# Patient Record
Sex: Male | Born: 1935 | Race: Black or African American | Hispanic: No | Marital: Married | Smoking: Never smoker
Health system: Southern US, Community
[De-identification: ages and names within clinical notes are randomized; demographics above are authoritative.]

## PROBLEM LIST (undated history)

## (undated) DIAGNOSIS — E119 Type 2 diabetes mellitus without complications: Secondary | ICD-10-CM

## (undated) DIAGNOSIS — I10 Essential (primary) hypertension: Secondary | ICD-10-CM

## (undated) DIAGNOSIS — I739 Peripheral vascular disease, unspecified: Secondary | ICD-10-CM

## (undated) HISTORY — PX: TOE SURGERY: SHX1073

## (undated) HISTORY — DX: Essential (primary) hypertension: I10

## (undated) HISTORY — DX: Type 2 diabetes mellitus without complications: E11.9

---

## 2006-03-31 ENCOUNTER — Ambulatory Visit: Payer: Self-pay | Admitting: *Deleted

## 2006-04-10 ENCOUNTER — Ambulatory Visit (HOSPITAL_COMMUNITY): Admission: RE | Admit: 2006-04-10 | Discharge: 2006-04-10 | Payer: Self-pay | Admitting: *Deleted

## 2006-04-13 ENCOUNTER — Ambulatory Visit: Payer: Self-pay | Admitting: Podiatry

## 2006-04-14 ENCOUNTER — Ambulatory Visit: Payer: Self-pay | Admitting: Podiatry

## 2012-11-12 ENCOUNTER — Ambulatory Visit: Payer: Self-pay | Admitting: Vascular Surgery

## 2012-11-12 LAB — BASIC METABOLIC PANEL
Anion Gap: 4 — ABNORMAL LOW (ref 7–16)
BUN: 10 mg/dL (ref 7–18)
Calcium, Total: 9.4 mg/dL (ref 8.5–10.1)
Chloride: 106 mmol/L (ref 98–107)
Co2: 31 mmol/L (ref 21–32)
Creatinine: 0.99 mg/dL (ref 0.60–1.30)
EGFR (African American): >60
EGFR (Non-African Amer.): >60
Glucose: 149 mg/dL — ABNORMAL HIGH (ref 65–99)
Osmolality: 283 (ref 275–301)
Potassium: 4 mmol/L (ref 3.5–5.1)
Sodium: 141 mmol/L (ref 136–145)

## 2012-11-21 ENCOUNTER — Ambulatory Visit: Payer: Self-pay | Admitting: Podiatry

## 2012-11-23 ENCOUNTER — Ambulatory Visit: Payer: Self-pay | Admitting: Podiatry

## 2012-11-26 LAB — PATHOLOGY REPORT

## 2012-11-27 LAB — WOUND CULTURE

## 2014-08-15 NOTE — Op Note (Signed)
PATIENT NAME:  Jose Schroeder, Murat MR#:  [id number] DATE OF BIRTH:  07/14/1932  DATE OF PROCEDURE:  11/12/2012  PREOPERATIVE DIAGNOSES: 1.  Peripheral arterial disease with ulceration, left great toe.  2.  Status previous toe amputation previously.  3.  Diabetes.  4.  Hypertension.   POSTOPERATIVE DIAGNOSES: 1.  Peripheral arterial disease with ulceration, left great toe.  2.  Status previous toe amputation previously.  3.  Diabetes.  4.  Hypertension.   PROCEDURES: 1.  Catheter placement to left peroneal artery from right femoral approach.  2.  Aortogram, selective left lower extremity angiogram.  3.  Percutaneous transluminal angioplasty of left peroneal artery with 3 mm diameter angioplasty balloon.  4.  StarClose closure device, right femoral artery.   SURGEON: Annice NeedyJason S. Isai Gottlieb, M.D.   ANESTHESIA: Local with moderate conscious sedation.   ESTIMATED BLOOD LOSS: Minimal.   INDICATION FOR PROCEDURE: This is a 81 year old African American male with multiple medical comorbidities, who has a nonhealing ulceration of left great toe. He has previously undergone toe amputation. He likely has some osteomyelitis in his left great toe and will need surgical treatment from his podiatrist. He is brought to the angiogram suite for further evaluation and potential treatment. Risks and benefits were discussed. Informed consent was obtained.   DESCRIPTION OF PROCEDURE: The patient is brought to the vascular interventional radiology suite. Groin was shaved and prepped, and a sterile surgical field was created. The right femoral head was localized with fluoroscopy, and the femoral artery was accessed without difficulty with a Seldinger needle. A J-wire and 5-French sheath was placed. Pigtail catheter was placed in the aorta at the L1-L2 level and AP aortogram was performed. This showed normal renal arteries, normal aorta and iliac segments without significant stenosis. I then hooked the aortic bifurcation and  advanced to the left femoral head and selective left lower extremity angiogram was then performed. This showed multiple areas of less than 30% stenosis in the superficial femoral and popliteal arteries that were not flow limiting. He then had initially a normal tibial trifurcation. The anterior tibial artery was occluded about 5 cm beyond its origin and did not really reconstitute. The posterior tibial artery was chronically occluded. The peroneal artery was the dominant runoff distally, and about 10 to 12 cm beyond its origin, it had a high-grade stenosis in the 80% to 90% range. Other than that, this was a good vessel and was his runoff to the foot, so we elected to treat this lesion. I placed a 6-French Ansel sheath over a Terumo Advantage wire and heparinized the patient. I was able to cross the lesion without difficulty with an 0.018 wire and a diagnostic catheter. I then performed balloon angioplasty of this lesion with a 3 mm diameter angioplasty balloon, and completion angiogram following angioplasty showed marked improvement with minimal residual stenosis in the peroneal artery. At this point, I elected to terminate the procedure. The sheaths were pulled back to the ipsilateral external iliac artery and oblique arteriogram was performed. StarClose closure device was deployed in the usual fashion with excellent hemostatic result. The patient tolerated the procedure well and was taken to the recovery room in stable condition.     ____________________________ Annice NeedyJason S. Bhavin Monjaraz, MD jsd:dmm D: 11/12/2012 11:01:09 ET T: 11/12/2012 11:17:44 ET JOB#: [id number]  cc: Annice NeedyJason S. Ann Bohne, MD, <Dictator> Duanne Limerickeanna C. Jones, MD Linus Galasodd Cline, DPM Jill AlexandersJustin A. Ether GriffinsFowler, DPM Rhona RaiderMatthew G. Troxler, DPM Annice NeedyJASON S Yuridia Couts MD ELECTRONICALLY SIGNED 11/12/2012 13:52

## 2014-08-15 NOTE — Op Note (Signed)
PATIENT NAME:  Jose Schroeder, Verdie MR#:  [id number] DATE OF BIRTH:  04-13-1933  DATE OF PROCEDURE:  11/23/2012  PREOPERATIVE DIAGNOSIS: Left great toe osteomyelitis.   POSTOPERATIVE DIAGNOSIS: Left great toe osteomyelitis.   PROCEDURE: Partial great toe amputation, interphalangeal joint, left foot.   SURGEON: Kacin Dancy A. Ether GriffinsFowler, DPM  ANESTHESIA: IV sedation with local.   COMPLICATIONS: None.   SPECIMEN: Distal phalanx, left great toe, for pathology and culture, left great toe.   ESTIMATED BLOOD LOSS: Minimal.   HEMOSTASIS: None.   OPERATIVE INDICATIONS: This is a 81 year old gentleman who has developed osteomyelitis on the distal aspect of his left great toe with positive erosive findings on the distal aspect of his great toe with purulent drainage. This seemed to be isolated to the distal phalanx and not proximal. We have discussed surgical intervention. He has undergone a lower extremity revascularization approximately a week ago. He presents today for surgery. All risks, benefits, alternatives and complications associated with the surgery had previously been discussed with the patient.   OPERATIVE PROCEDURE: The patient was brought into the OR and placed on the operating table in the supine position. IV sedation was administered by the anesthesia team. A local block was placed around the first MTPJ with lidocaine and Marcaine in a 1:1 fashion. A total of 10 mL was used. After sterile prep and drape, attention was directed to the distal left great toe, where a fishmouth-type of incision was made at the IPJ. The distal phalanx was disarticulated. The wound was flushed with copious amounts of irrigation. The head of the proximal phalanx was inspected, and no erosive changes or infectious tissue was grossly noted. This area was then irrigated with copious amounts of fluid. At this point, layered closure was performed with a 4-0 Vicryl for the subcutaneous tissue and a 3-0 nylon for the skin. A well  compressive sterile dressing was placed on his left foot. He tolerated the procedure and anesthesia well and was transported from the OR to the PACU with all vital signs stable and neurovascular status intact. I will see him in the outpatient clinic in 5 to 7 days.  ____________________________ Argentina DonovanJustin A. Ether GriffinsFowler, DPM jaf:OSi D: 11/23/2012 08:23:49 ET T: 11/23/2012 08:45:59 ET JOB#: [id number]  cc: Jill AlexandersJustin A. Ether GriffinsFowler, DPM, <Dictator> Demetrice Combes DPM ELECTRONICALLY SIGNED 12/18/2012 10:42

## 2014-09-09 DIAGNOSIS — E119 Type 2 diabetes mellitus without complications: Secondary | ICD-10-CM | POA: Diagnosis not present

## 2014-12-22 ENCOUNTER — Other Ambulatory Visit: Payer: Self-pay | Admitting: Family Medicine

## 2014-12-22 DIAGNOSIS — I10 Essential (primary) hypertension: Secondary | ICD-10-CM

## 2014-12-22 DIAGNOSIS — E119 Type 2 diabetes mellitus without complications: Secondary | ICD-10-CM

## 2015-01-23 ENCOUNTER — Other Ambulatory Visit: Payer: Self-pay | Admitting: Family Medicine

## 2015-01-23 DIAGNOSIS — E119 Type 2 diabetes mellitus without complications: Secondary | ICD-10-CM | POA: Diagnosis not present

## 2015-02-20 ENCOUNTER — Encounter: Payer: Self-pay | Admitting: Family Medicine

## 2015-02-20 ENCOUNTER — Ambulatory Visit (INDEPENDENT_AMBULATORY_CARE_PROVIDER_SITE_OTHER): Payer: Medicare Other | Admitting: Family Medicine

## 2015-02-20 VITALS — BP 138/80 | HR 80 | Ht 74.0 in | Wt 158.0 lb

## 2015-02-20 DIAGNOSIS — R5383 Other fatigue: Secondary | ICD-10-CM

## 2015-02-20 DIAGNOSIS — Z1211 Encounter for screening for malignant neoplasm of colon: Secondary | ICD-10-CM | POA: Diagnosis not present

## 2015-02-20 DIAGNOSIS — E119 Type 2 diabetes mellitus without complications: Secondary | ICD-10-CM

## 2015-02-20 DIAGNOSIS — Z23 Encounter for immunization: Secondary | ICD-10-CM | POA: Diagnosis not present

## 2015-02-20 DIAGNOSIS — E785 Hyperlipidemia, unspecified: Secondary | ICD-10-CM | POA: Diagnosis not present

## 2015-02-20 DIAGNOSIS — I10 Essential (primary) hypertension: Secondary | ICD-10-CM

## 2015-02-20 LAB — HEMOCCULT GUIAC POC 1CARD (OFFICE): Fecal Occult Blood, POC: NEGATIVE

## 2015-02-20 MED ORDER — METFORMIN HCL 500 MG PO TABS: 500.0000 mg | ORAL_TABLET | Freq: Two times a day (BID) | ORAL | Status: DC

## 2015-02-20 MED ORDER — PIOGLITAZONE HCL 15 MG PO TABS: ORAL_TABLET | ORAL | Status: DC

## 2015-02-20 MED ORDER — METOPROLOL TARTRATE 50 MG PO TABS: 50.0000 mg | ORAL_TABLET | Freq: Two times a day (BID) | ORAL | Status: DC

## 2015-02-20 MED ORDER — LISINOPRIL 2.5 MG PO TABS: ORAL_TABLET | ORAL | Status: DC

## 2015-02-20 NOTE — Progress Notes (Signed)
Name: Jose Schroeder   MRN: [id number]    DOB: 1932/11/21   Date:02/20/2015       Progress Note  Subjective  Chief Complaint  Chief Complaint  Patient presents with  . Hypertension  . Diabetes    Hypertension This is a chronic problem. The current episode started more than 1 year ago. The problem has been gradually improving since onset. The problem is controlled. Pertinent negatives include no anxiety, blurred vision, chest pain, headaches, malaise/fatigue, neck pain, orthopnea, palpitations, peripheral edema, PND, shortness of breath or sweats. There are no associated agents to hypertension. Risk factors for coronary artery disease include diabetes mellitus and male gender. Past treatments include ACE inhibitors and beta blockers. The current treatment provides mild improvement. There are no compliance problems.  There is no history of angina, kidney disease, CAD/MI, CVA, heart failure, left ventricular hypertrophy, PVD, renovascular disease or retinopathy. There is no history of chronic renal disease.  Diabetes He presents for his follow-up diabetic visit. He has type 2 diabetes mellitus. His disease course has been improving. There are no hypoglycemic associated symptoms. Pertinent negatives for hypoglycemia include no dizziness, headaches, nervousness/anxiousness or sweats. Pertinent negatives for diabetes include no blurred vision, no chest pain, no fatigue, no foot paresthesias, no foot ulcerations, no polydipsia, no polyphagia, no polyuria, no visual change, no weakness and no weight loss. There are no hypoglycemic complications. There are no diabetic complications. Pertinent negatives for diabetic complications include no CVA, heart disease, nephropathy, peripheral neuropathy, PVD or retinopathy. Current diabetic treatment includes oral agent (dual therapy). He is compliant with treatment all of the time. His weight is stable. He is following a generally healthy diet. He participates in  exercise daily. An ACE inhibitor/angiotensin II receptor blocker is being taken. He does not see a podiatrist.Eye exam is not current.    No problem-specific assessment & plan notes found for this encounter.   Past Medical History  Diagnosis Date  . Diabetes mellitus without complication (HCC)   . Hypertension     Past Surgical History  Procedure Laterality Date  . Toe surgery      amputated 2 toes on L) foot    History reviewed. No pertinent family history.  Social History   Social History  . Marital Status: Married    Spouse Name: N/A  . Number of Children: N/A  . Years of Education: N/A   Occupational History  . Not on file.   Social History Main Topics  . Smoking status: Never Smoker   . Smokeless tobacco: Not on file  . Alcohol Use: No  . Drug Use: No  . Sexual Activity: Not Currently   Other Topics Concern  . Not on file   Social History Narrative  . No narrative on file    No Known Allergies   Review of Systems  Constitutional: Negative for fever, chills, weight loss, malaise/fatigue and fatigue.  HENT: Negative for ear discharge, ear pain and sore throat.   Eyes: Negative for blurred vision.  Respiratory: Negative for cough, sputum production, shortness of breath and wheezing.   Cardiovascular: Negative for chest pain, palpitations, orthopnea, leg swelling and PND.  Gastrointestinal: Positive for constipation. Negative for heartburn, nausea, abdominal pain, diarrhea, blood in stool and melena.  Genitourinary: Negative for dysuria, urgency, frequency and hematuria.  Musculoskeletal: Negative for myalgias, back pain, joint pain and neck pain.  Skin: Negative for rash.  Neurological: Negative for dizziness, tingling, sensory change, focal weakness, weakness and headaches.  Endo/Heme/Allergies: Negative  for environmental allergies, polydipsia and polyphagia. Does not bruise/bleed easily.  Psychiatric/Behavioral: Negative for depression and suicidal  ideas. The patient is not nervous/anxious and does not have insomnia.      Objective  Filed Vitals:   02/20/15 1402  BP: 138/80  Pulse: 80  Height: 6\' 2"  (1.88 m)  Weight: 158 lb (71.668 kg)    Physical Exam  Constitutional: He is oriented to person, place, and time and well-developed, well-nourished, and in no distress.  HENT:  Head: Normocephalic.  Right Ear: External ear normal.  Left Ear: External ear normal.  Nose: Nose normal.  Mouth/Throat: Oropharynx is clear and moist.  Eyes: Conjunctivae and EOM are normal. Pupils are equal, round, and reactive to light. Right eye exhibits no discharge. Left eye exhibits no discharge. No scleral icterus.  conjunctiva pale  Neck: Normal range of motion. Neck supple. No JVD present. No tracheal deviation present. No thyromegaly present.  Cardiovascular: Normal rate, regular rhythm, normal heart sounds and intact distal pulses.  Exam reveals no gallop and no friction rub.   No murmur heard. Pulmonary/Chest: Breath sounds normal. No respiratory distress. He has no wheezes. He has no rales.  Abdominal: Soft. Bowel sounds are normal. He exhibits no mass. There is no hepatosplenomegaly. There is no tenderness. There is no rebound, no guarding and no CVA tenderness.  Genitourinary: Rectum normal and prostate normal. Guaiac negative stool.  Musculoskeletal: Normal range of motion. He exhibits no edema or tenderness.  Lymphadenopathy:    He has no cervical adenopathy.  Neurological: He is alert and oriented to person, place, and time. He has normal sensation, normal strength, normal reflexes and intact cranial nerves. No cranial nerve deficit.  Skin: Skin is warm. No rash noted. No cyanosis. There is pallor. Nails show no clubbing.  Nail beds pale  Psychiatric: Mood and affect normal.  Nursing note and vitals reviewed.     Assessment & Plan  Problem List Items Addressed This Visit    None    Visit Diagnoses    Essential hypertension     -  Primary    Relevant Medications    lisinopril (PRINIVIL,ZESTRIL) 2.5 MG tablet    metoprolol (LOPRESSOR) 50 MG tablet    Other Relevant Orders    Renal Function Panel    Type 2 diabetes mellitus without complication, without long-term current use of insulin (HCC)        Relevant Medications    lisinopril (PRINIVIL,ZESTRIL) 2.5 MG tablet    metFORMIN (GLUCOPHAGE) 500 MG tablet    pioglitazone (ACTOS) 15 MG tablet    Other Relevant Orders    Renal Function Panel    HgB A1c    Urine Microalbumin w/creat. ratio    Hyperlipidemia        Relevant Medications    lisinopril (PRINIVIL,ZESTRIL) 2.5 MG tablet    metoprolol (LOPRESSOR) 50 MG tablet    Other Relevant Orders    Lipid Profile    Other fatigue        Relevant Orders    POCT Occult Blood Stool    CBC w/Diff/Platelet    Colon cancer screening        Relevant Orders    POCT Occult Blood Stool    Need for Tdap vaccination        Relevant Orders    Tdap vaccine greater than or equal to 7yo IM (Completed)         Dr. Hayden Rasmusseneanna Valetta Mulroy [facility]  02/20/2015    

## 2015-02-21 LAB — CBC WITH DIFFERENTIAL/PLATELET
Basophils Absolute: 0 10*3/uL (ref 0.0–0.2)
Basos: 0 %
EOS (ABSOLUTE): 0.1 10*3/uL (ref 0.0–0.4)
Eos: 1 %
Hematocrit: 32.1 % — ABNORMAL LOW (ref 37.5–51.0)
Hemoglobin: 10.7 g/dL — ABNORMAL LOW (ref 12.6–17.7)
Immature Grans (Abs): 0 10*3/uL (ref 0.0–0.1)
Immature Granulocytes: 0 %
Lymphocytes Absolute: 1.4 10*3/uL (ref 0.7–3.1)
Lymphs: 25 %
MCH: 28.9 pg (ref 26.6–33.0)
MCHC: 33.3 g/dL (ref 31.5–35.7)
MCV: 87 fL (ref 79–97)
Monocytes Absolute: 0.4 10*3/uL (ref 0.1–0.9)
Monocytes: 7 %
Neutrophils Absolute: 3.7 10*3/uL (ref 1.4–7.0)
Neutrophils: 67 %
Platelets: 257 10*3/uL (ref 150–379)
RBC: 3.7 x10E6/uL — ABNORMAL LOW (ref 4.14–5.80)
RDW: 13.4 % (ref 12.3–15.4)
WBC: 5.7 10*3/uL (ref 3.4–10.8)

## 2015-02-21 LAB — RENAL FUNCTION PANEL
Albumin: 4.5 g/dL (ref 3.5–4.7)
BUN/Creatinine Ratio: 11 (ref 10–22)
BUN: 11 mg/dL (ref 8–27)
CO2: 25 mmol/L (ref 18–29)
Calcium: 9.4 mg/dL (ref 8.6–10.2)
Chloride: 101 mmol/L (ref 97–106)
Creatinine, Ser: 0.97 mg/dL (ref 0.76–1.27)
GFR calc Af Amer: 85 mL/min/{1.73_m2} (ref >59)
GFR calc non Af Amer: 73 mL/min/{1.73_m2} (ref >59)
Glucose: 150 mg/dL — ABNORMAL HIGH (ref 65–99)
Phosphorus: 3.5 mg/dL (ref 2.5–4.5)
Potassium: 4.6 mmol/L (ref 3.5–5.2)
Sodium: 141 mmol/L (ref 136–144)

## 2015-02-21 LAB — LIPID PANEL
Chol/HDL Ratio: 2.9 ratio units (ref 0.0–5.0)
Cholesterol, Total: 154 mg/dL (ref 100–199)
HDL: 53 mg/dL (ref >39)
LDL Calculated: 84 mg/dL (ref 0–99)
Triglycerides: 84 mg/dL (ref 0–149)
VLDL Cholesterol Cal: 17 mg/dL (ref 5–40)

## 2015-02-21 LAB — HEMOGLOBIN A1C
Est. average glucose Bld gHb Est-mCnc: 151 mg/dL
Hgb A1c MFr Bld: 6.9 % — ABNORMAL HIGH (ref 4.8–5.6)

## 2015-02-21 LAB — MICROALBUMIN / CREATININE URINE RATIO

## 2015-03-04 ENCOUNTER — Ambulatory Visit (INDEPENDENT_AMBULATORY_CARE_PROVIDER_SITE_OTHER): Payer: Medicare Other | Admitting: Family Medicine

## 2015-03-04 ENCOUNTER — Encounter: Payer: Self-pay | Admitting: Family Medicine

## 2015-03-04 VITALS — BP 138/80 | HR 68 | Ht 74.0 in | Wt 161.0 lb

## 2015-03-04 DIAGNOSIS — D649 Anemia, unspecified: Secondary | ICD-10-CM | POA: Diagnosis not present

## 2015-03-04 LAB — HEMOCCULT GUIAC POC 1CARD (OFFICE): Card #3 Fecal Occult Blood, POC: NEGATIVE

## 2015-03-04 MED ORDER — FERROUS SULFATE 325 (65 FE) MG PO TABS: 325.0000 mg | ORAL_TABLET | Freq: Every day | ORAL | Status: DC

## 2015-03-04 NOTE — Progress Notes (Signed)
Name: Jose Schroeder   MRN: [id number]    DOB: Dec 07, 1932   Date:03/04/2015       Progress Note  Subjective  Chief Complaint  Chief Complaint  Patient presents with  . Follow-up    anemia    Anemia Presents for follow-up visit. Symptoms include pallor. There has been no abdominal pain, anorexia, bruising/bleeding easily, confusion, fever, leg swelling, light-headedness, malaise/fatigue, palpitations, paresthesias, pica or weight loss. Signs of blood loss that are not present include hematemesis, hematochezia and melena. Past treatments include changes in diet. There is no history of alcohol abuse, cancer, chronic liver disease, chronic renal disease, clotting disorder, dementia, heart failure, hemoglobinopathy, hypothyroidism, malabsorption, malnutrition, neuropathy or rheumatic disease. There are no compliance problems.     No problem-specific assessment & plan notes found for this encounter.   Past Medical History  Diagnosis Date  . Diabetes mellitus without complication (HCC)   . Hypertension     Past Surgical History  Procedure Laterality Date  . Toe surgery      amputated 2 toes on L) foot    History reviewed. No pertinent family history.  Social History   Social History  . Marital Status: Married    Spouse Name: N/A  . Number of Children: N/A  . Years of Education: N/A   Occupational History  . Not on file.   Social History Main Topics  . Smoking status: Never Smoker   . Smokeless tobacco: Not on file  . Alcohol Use: No  . Drug Use: No  . Sexual Activity: Not Currently   Other Topics Concern  . Not on file   Social History Narrative    No Known Allergies   Review of Systems  Constitutional: Negative for fever, weight loss and malaise/fatigue.  Cardiovascular: Negative for palpitations.  Gastrointestinal: Negative for abdominal pain, melena, hematochezia, anorexia and hematemesis.  Skin: Positive for pallor.  Neurological: Negative for  light-headedness and paresthesias.  Endo/Heme/Allergies: Does not bruise/bleed easily.  Psychiatric/Behavioral: Negative for confusion.     Objective  Filed Vitals:   03/04/15 1021  BP: 138/80  Pulse: 68  Height: 6\' 2"  (1.88 m)  Weight: 161 lb (73.029 kg)    Physical Exam  Constitutional: He is oriented to person, place, and time and well-developed, well-nourished, and in no distress.  HENT:  Head: Normocephalic.  Right Ear: External ear normal.  Left Ear: External ear normal.  Nose: Nose normal.  Mouth/Throat: Oropharynx is clear and moist.  Eyes: Conjunctivae and EOM are normal. Pupils are equal, round, and reactive to light. Right eye exhibits no discharge. Left eye exhibits no discharge. No scleral icterus.  Neck: Normal range of motion. Neck supple. No JVD present. No tracheal deviation present. No thyromegaly present.  Cardiovascular: Normal rate, regular rhythm, normal heart sounds and intact distal pulses.  Exam reveals no gallop and no friction rub.   No murmur heard. Pulmonary/Chest: Breath sounds normal. No respiratory distress. He has no wheezes. He has no rales.  Abdominal: Soft. Bowel sounds are normal. He exhibits no mass. There is no hepatosplenomegaly. There is no tenderness. There is no rebound, no guarding and no CVA tenderness.  Genitourinary: Rectum normal and prostate normal.  Musculoskeletal: Normal range of motion. He exhibits no edema or tenderness.  Lymphadenopathy:    He has no cervical adenopathy.  Neurological: He is alert and oriented to person, place, and time. He has normal sensation, normal strength, normal reflexes and intact cranial nerves. No cranial nerve deficit.  Skin:  Skin is warm. No rash noted.  Psychiatric: Mood and affect normal.      Assessment & Plan  Problem List Items Addressed This Visit    None    Visit Diagnoses    Anemia, unspecified anemia type    -  Primary    Relevant Medications    ferrous sulfate 325 (65 FE) MG  tablet    Other Relevant Orders    POCT Occult Blood Stool (Completed)         Dr. Elizabeth Sauer [facility]  03/04/2015

## 2015-06-02 DIAGNOSIS — E119 Type 2 diabetes mellitus without complications: Secondary | ICD-10-CM | POA: Diagnosis not present

## 2015-09-24 ENCOUNTER — Encounter: Payer: Self-pay | Admitting: Family Medicine

## 2015-09-24 ENCOUNTER — Ambulatory Visit (INDEPENDENT_AMBULATORY_CARE_PROVIDER_SITE_OTHER): Payer: Medicare Other | Admitting: Family Medicine

## 2015-09-24 VITALS — BP 138/80 | HR 72 | Ht 74.0 in | Wt 163.0 lb

## 2015-09-24 DIAGNOSIS — E1159 Type 2 diabetes mellitus with other circulatory complications: Secondary | ICD-10-CM | POA: Insufficient documentation

## 2015-09-24 DIAGNOSIS — I10 Essential (primary) hypertension: Secondary | ICD-10-CM

## 2015-09-24 DIAGNOSIS — E119 Type 2 diabetes mellitus without complications: Secondary | ICD-10-CM | POA: Diagnosis not present

## 2015-09-24 DIAGNOSIS — D509 Iron deficiency anemia, unspecified: Secondary | ICD-10-CM

## 2015-09-24 DIAGNOSIS — Z23 Encounter for immunization: Secondary | ICD-10-CM | POA: Diagnosis not present

## 2015-09-24 MED ORDER — PIOGLITAZONE HCL 15 MG PO TABS: ORAL_TABLET | ORAL | Status: DC

## 2015-09-24 MED ORDER — GLIPIZIDE ER 10 MG PO TB24
10.0000 mg | ORAL_TABLET | Freq: Every day | ORAL | Status: DC
Start: 2015-09-24 — End: 2015-12-10

## 2015-09-24 MED ORDER — METFORMIN HCL 500 MG PO TABS: 500.0000 mg | ORAL_TABLET | Freq: Two times a day (BID) | ORAL | Status: DC

## 2015-09-24 MED ORDER — FERROUS SULFATE 325 (65 FE) MG PO TABS: 325.0000 mg | ORAL_TABLET | Freq: Every day | ORAL | Status: DC

## 2015-09-24 MED ORDER — LISINOPRIL 2.5 MG PO TABS: ORAL_TABLET | ORAL | Status: DC

## 2015-09-24 MED ORDER — METOPROLOL TARTRATE 50 MG PO TABS: 50.0000 mg | ORAL_TABLET | Freq: Two times a day (BID) | ORAL | Status: DC

## 2015-09-24 NOTE — Patient Instructions (Signed)
Diabetic Retinopathy Diabetic retinopathy is a disease of the light-sensitive membrane at the back of the eye (retina). It is a complication of diabetes and a common cause of blindness. Early detection of the disease is key to keeping your eyes healthy.  CAUSES  Diabetic retinopathy is caused by blood sugar (glucose) levels that are too high over an extended period of time. High blood sugars cause damage to the small blood vessels of the retina, allowing blood to leak through the vessel walls. This causes visual impairment and eventually blindness. RISK FACTORS  High blood pressure.  Having diabetes for a long time.  Having poorly controlled blood sugars. SIGNS AND SYMPTOMS  In the early stages of diabetic retinopathy, there are often no symptoms. As the condition advances, symptoms may include:  Blurred vision. This is usually caused by a swelling due to abnormal blood glucose levels. The blurriness may go away when blood glucose levels return to normal.  Moving specks or dark spots (floaters) in your vision. These can be caused by a small retinal hemorrhage. A hemorrhage is bleeding from blood vessels.  Missing parts of your field of vision, such as things at the side. This can be caused by larger retinal hemorrhages.  Difficulty reading books or signs.  Double vision.  Pain in one or both eyes.  Feeling pressure in one or both eyes.  Trouble seeing straight lines. Straight lines do not look straight.  Redness of the eyes that does not go away. DIAGNOSIS  Your eye care specialist can detect changes in the blood vessels of your eye by putting drops in your eyes that enlarge (dilate) your pupils. This allows your eye care specialist to get a good look at your retina to see if there are any changes that have occurred as a result of your diabetes. You should have your eyes examined once a year. TREATMENT  Your eye care specialist may use a special laser beam to seal the blood vessels  of the retina and stop them from leaking. Early detection and treatment are important so that further damage to your eyes can be prevented. In addition, managing your blood sugars and keeping them in the target range can slow the progress of the disease. HOME CARE INSTRUCTIONS   Keep your blood pressure within your target range.  Keep your blood glucose levels within your target range.  Follow your health care provider's instructions regarding diet and other means for controlling your blood glucose levels.  Check your blood levels for glucose as recommended by your health care provider.  Keep regular appointments with your eye specialist. An eye specialist can usually see diabetic retinopathy developing long before it starts causing problems. In many cases, it can be treated to prevent complications from occurring. If you have diabetes, you should have your eyes checked at least every year. Your risk of retinopathy increases the longer you have the disease.  If you smoke, quit. Ask your health care provider for help if needed. Smoking can make retinopathy worse. SEEK MEDICAL CARE IF:   You notice gradual blurring or other changes in your vision over time.  You notice that your glasses or contact lenses do not make things look as sharp as they once did.  You have trouble reading or seeing details at a distance with either eye.  You notice a sudden change in your vision or notice that parts of your field of vision appear missing or hazy.  You suddenly see moving specks or dark spots   in the field of vision of either eye.  You have sudden partial loss of vision in either eye.   This information is not intended to replace advice given to you by your health care provider. Make sure you discuss any questions you have with your health care provider.   Document Released: 04/08/2000 Document Revised: 01/30/2013 Document Reviewed: 10/01/2012 Elsevier Interactive Patient Education 2016 [employer]  

## 2015-09-24 NOTE — Progress Notes (Signed)
Name: Jose Schroeder   MRN: [id number]    DOB: [birth year]   Date:09/24/2015       Progress Note  Subjective  Chief Complaint  Chief Complaint  Patient presents with  . Hypertension  . Diabetes    Hypertension This is a new problem. The current episode started more than 1 year ago. The problem has been gradually worsening since onset. The problem is controlled. Pertinent negatives include no anxiety, blurred vision, chest pain, headaches, malaise/fatigue, neck pain, orthopnea, palpitations, peripheral edema, PND, shortness of breath or sweats. There are no associated agents to hypertension. There are no known risk factors for coronary artery disease. Past treatments include ACE inhibitors and diuretics. The current treatment provides moderate improvement. There are no compliance problems.  There is no history of angina, kidney disease, CAD/MI, CVA, heart failure, left ventricular hypertrophy, PVD, renovascular disease or retinopathy. There is no history of chronic renal disease or a hypertension causing med.  Diabetes He presents for his follow-up diabetic visit. He has type 2 diabetes mellitus. There are no hypoglycemic associated symptoms. Pertinent negatives for hypoglycemia include no dizziness, headaches, nervousness/anxiousness or sweats. Pertinent negatives for diabetes include no blurred vision, no chest pain, no fatigue, no foot paresthesias, no foot ulcerations, no polydipsia, no polyphagia, no polyuria, no visual change, no weakness and no weight loss. There are no hypoglycemic complications. There are no diabetic complications. Pertinent negatives for diabetic complications include no CVA, PVD or retinopathy. Current diabetic treatment includes oral agent (triple therapy). He is compliant with treatment most of the time. His weight is stable. He is following a generally healthy diet. He rarely participates in exercise. His breakfast blood glucose is taken between 8-9 am. His breakfast  blood glucose range is generally 90-110 mg/dl. He does not see a podiatrist.Eye exam is not current.    No problem-specific assessment & plan notes found for this encounter.   Past Medical History  Diagnosis Date  . Diabetes mellitus without complication (HCC)   . Hypertension     Past Surgical History  Procedure Laterality Date  . Toe surgery      amputated 2 toes on L) foot    History reviewed. No pertinent family history.  Social History   Social History  . Marital Status: Married    Spouse Name: N/A  . Number of Children: N/A  . Years of Education: N/A   Occupational History  . Not on file.   Social History Main Topics  . Smoking status: Never Smoker   . Smokeless tobacco: Not on file  . Alcohol Use: No  . Drug Use: No  . Sexual Activity: Not Currently   Other Topics Concern  . Not on file   Social History Narrative    No Known Allergies   Review of Systems  Constitutional: Negative for fever, chills, weight loss, malaise/fatigue and fatigue.  HENT: Negative for ear discharge, ear pain and sore throat.   Eyes: Negative for blurred vision.  Respiratory: Negative for cough, sputum production, shortness of breath and wheezing.   Cardiovascular: Negative for chest pain, palpitations, orthopnea, leg swelling and PND.  Gastrointestinal: Negative for heartburn, nausea, abdominal pain, diarrhea, constipation, blood in stool and melena.  Genitourinary: Negative for dysuria, urgency, frequency and hematuria.  Musculoskeletal: Negative for myalgias, back pain, joint pain and neck pain.  Skin: Negative for rash.  Neurological: Negative for dizziness, tingling, sensory change, focal weakness, weakness and headaches.  Endo/Heme/Allergies: Negative for environmental allergies, polydipsia and polyphagia. Does not  bruise/bleed easily.  Psychiatric/Behavioral: Negative for depression and suicidal ideas. The patient is not nervous/anxious and does not have insomnia.       Objective  Filed Vitals:   09/24/15 1346  BP: 138/80  Pulse: 72  Height: 6\' 2"  (1.88 m)  Weight: 163 lb (73.936 kg)    Physical Exam  Constitutional: He is oriented to person, place, and time and well-developed, well-nourished, and in no distress.  HENT:  Head: Normocephalic.  Right Ear: External ear normal.  Left Ear: External ear normal.  Nose: Nose normal.  Mouth/Throat: Oropharynx is clear and moist.  Eyes: Conjunctivae and EOM are normal. Pupils are equal, round, and reactive to light. Right eye exhibits no discharge. Left eye exhibits no discharge. No scleral icterus.  Neck: Normal range of motion. Neck supple. No JVD present. No tracheal deviation present. No thyromegaly present.  Cardiovascular: Normal rate, regular rhythm, normal heart sounds and intact distal pulses.  Exam reveals no gallop and no friction rub.   No murmur heard. Pulmonary/Chest: Breath sounds normal. No respiratory distress. He has no wheezes. He has no rales.  Abdominal: Soft. Bowel sounds are normal. He exhibits no mass. There is no hepatosplenomegaly. There is no tenderness. There is no rebound, no guarding and no CVA tenderness.  Musculoskeletal: Normal range of motion. He exhibits no edema or tenderness.  Lymphadenopathy:    He has no cervical adenopathy.  Neurological: He is alert and oriented to person, place, and time. He has normal sensation, normal strength, normal reflexes and intact cranial nerves. No cranial nerve deficit.  Skin: Skin is warm. No rash noted.  Psychiatric: Mood and affect normal.  Nursing note and vitals reviewed.     Assessment & Plan  Problem List Items Addressed This Visit      Cardiovascular and Mediastinum   Essential hypertension - Primary   Relevant Medications   lisinopril (PRINIVIL,ZESTRIL) 2.5 MG tablet   metoprolol (LOPRESSOR) 50 MG tablet   Other Relevant Orders   Renal Function Panel     Endocrine   Type 2 diabetes mellitus without  complication, without long-term current use of insulin (HCC)   Relevant Medications   lisinopril (PRINIVIL,ZESTRIL) 2.5 MG tablet   pioglitazone (ACTOS) 15 MG tablet   glipiZIDE (GLUCOTROL XL) 10 MG 24 hr tablet   metFORMIN (GLUCOPHAGE) 500 MG tablet   Other Relevant Orders   Hemoglobin A1c   Microalbumin / creatinine urine ratio    Other Visit Diagnoses    Anemia, iron deficiency        Relevant Medications    ferrous sulfate 325 (65 FE) MG tablet    Other Relevant Orders    Hemoglobin    Need for pneumococcal vaccination        Relevant Orders    Pneumococcal conjugate vaccine 13-valent (Completed)         Dr. Hayden Rasmusseneanna Jones [facility]  09/24/2015

## 2015-09-25 LAB — RENAL FUNCTION PANEL
Albumin: 4.5 g/dL (ref 3.5–4.7)
BUN/Creatinine Ratio: 13 (ref 10–24)
BUN: 13 mg/dL (ref 8–27)
CO2: 22 mmol/L (ref 18–29)
Calcium: 9.4 mg/dL (ref 8.6–10.2)
Chloride: 100 mmol/L (ref 96–106)
Creatinine, Ser: 0.99 mg/dL (ref 0.76–1.27)
GFR calc Af Amer: 82 mL/min/{1.73_m2} (ref >59)
GFR calc non Af Amer: 71 mL/min/{1.73_m2} (ref >59)
Glucose: 168 mg/dL — ABNORMAL HIGH (ref 65–99)
Phosphorus: 3 mg/dL (ref 2.5–4.5)
Potassium: 4.2 mmol/L (ref 3.5–5.2)
Sodium: 141 mmol/L (ref 134–144)

## 2015-09-25 LAB — MICROALBUMIN / CREATININE URINE RATIO

## 2015-09-25 LAB — HEMOGLOBIN: Hemoglobin: 11 g/dL — ABNORMAL LOW (ref 12.6–17.7)

## 2015-09-25 LAB — HEMOGLOBIN A1C
Est. average glucose Bld gHb Est-mCnc: 157 mg/dL
Hgb A1c MFr Bld: 7.1 % — ABNORMAL HIGH (ref 4.8–5.6)

## 2015-10-07 DIAGNOSIS — E119 Type 2 diabetes mellitus without complications: Secondary | ICD-10-CM | POA: Diagnosis not present

## 2015-10-29 ENCOUNTER — Other Ambulatory Visit: Payer: Self-pay

## 2015-10-30 ENCOUNTER — Other Ambulatory Visit: Payer: Self-pay

## 2015-12-10 ENCOUNTER — Other Ambulatory Visit: Payer: Self-pay

## 2015-12-10 DIAGNOSIS — E119 Type 2 diabetes mellitus without complications: Secondary | ICD-10-CM

## 2015-12-10 DIAGNOSIS — I10 Essential (primary) hypertension: Secondary | ICD-10-CM

## 2015-12-10 MED ORDER — METFORMIN HCL 500 MG PO TABS: 500.0000 mg | ORAL_TABLET | Freq: Two times a day (BID) | ORAL | 0 refills | Status: DC

## 2015-12-10 MED ORDER — METOPROLOL TARTRATE 50 MG PO TABS: 50.0000 mg | ORAL_TABLET | Freq: Two times a day (BID) | ORAL | 0 refills | Status: DC

## 2015-12-10 MED ORDER — LISINOPRIL 2.5 MG PO TABS: ORAL_TABLET | ORAL | 0 refills | Status: DC

## 2015-12-10 MED ORDER — GLIPIZIDE ER 10 MG PO TB24: 10.0000 mg | ORAL_TABLET | Freq: Every day | ORAL | 0 refills | Status: DC

## 2015-12-10 MED ORDER — PIOGLITAZONE HCL 15 MG PO TABS: ORAL_TABLET | ORAL | 0 refills | Status: DC

## 2015-12-22 ENCOUNTER — Other Ambulatory Visit: Payer: Self-pay

## 2015-12-22 DIAGNOSIS — I10 Essential (primary) hypertension: Secondary | ICD-10-CM

## 2015-12-22 MED ORDER — METOPROLOL TARTRATE 50 MG PO TABS: 50.0000 mg | ORAL_TABLET | Freq: Two times a day (BID) | ORAL | 0 refills | Status: DC

## 2015-12-25 DIAGNOSIS — E11621 Type 2 diabetes mellitus with foot ulcer: Secondary | ICD-10-CM | POA: Diagnosis not present

## 2015-12-25 DIAGNOSIS — I739 Peripheral vascular disease, unspecified: Secondary | ICD-10-CM | POA: Diagnosis not present

## 2015-12-25 DIAGNOSIS — L97509 Non-pressure chronic ulcer of other part of unspecified foot with unspecified severity: Secondary | ICD-10-CM | POA: Diagnosis not present

## 2015-12-25 DIAGNOSIS — L97512 Non-pressure chronic ulcer of other part of right foot with fat layer exposed: Secondary | ICD-10-CM | POA: Diagnosis not present

## 2016-01-04 ENCOUNTER — Encounter: Payer: Self-pay | Admitting: Internal Medicine

## 2016-01-04 ENCOUNTER — Inpatient Hospital Stay
Admission: AD | Admit: 2016-01-04 | Discharge: 2016-01-08 | DRG: 253 | Disposition: A | Discharge status: Home Health | Payer: Medicare Other | Source: Ambulatory Visit | Attending: Internal Medicine | Admitting: Internal Medicine

## 2016-01-04 DIAGNOSIS — E1152 Type 2 diabetes mellitus with diabetic peripheral angiopathy with gangrene: Principal | ICD-10-CM | POA: Diagnosis present

## 2016-01-04 DIAGNOSIS — Z89422 Acquired absence of other left toe(s): Secondary | ICD-10-CM

## 2016-01-04 DIAGNOSIS — I745 Embolism and thrombosis of iliac artery: Secondary | ICD-10-CM | POA: Diagnosis not present

## 2016-01-04 DIAGNOSIS — Z79899 Other long term (current) drug therapy: Secondary | ICD-10-CM | POA: Diagnosis not present

## 2016-01-04 DIAGNOSIS — I714 Abdominal aortic aneurysm, without rupture: Secondary | ICD-10-CM | POA: Diagnosis present

## 2016-01-04 DIAGNOSIS — I70262 Atherosclerosis of native arteries of extremities with gangrene, left leg: Secondary | ICD-10-CM | POA: Diagnosis present

## 2016-01-04 DIAGNOSIS — L03115 Cellulitis of right lower limb: Secondary | ICD-10-CM | POA: Diagnosis not present

## 2016-01-04 DIAGNOSIS — I70261 Atherosclerosis of native arteries of extremities with gangrene, right leg: Secondary | ICD-10-CM | POA: Diagnosis not present

## 2016-01-04 DIAGNOSIS — Z7982 Long term (current) use of aspirin: Secondary | ICD-10-CM | POA: Diagnosis not present

## 2016-01-04 DIAGNOSIS — L97513 Non-pressure chronic ulcer of other part of right foot with necrosis of muscle: Secondary | ICD-10-CM | POA: Diagnosis not present

## 2016-01-04 DIAGNOSIS — E119 Type 2 diabetes mellitus without complications: Secondary | ICD-10-CM | POA: Diagnosis not present

## 2016-01-04 DIAGNOSIS — I739 Peripheral vascular disease, unspecified: Secondary | ICD-10-CM | POA: Diagnosis not present

## 2016-01-04 DIAGNOSIS — Z7902 Long term (current) use of antithrombotics/antiplatelets: Secondary | ICD-10-CM | POA: Diagnosis not present

## 2016-01-04 DIAGNOSIS — I10 Essential (primary) hypertension: Secondary | ICD-10-CM | POA: Diagnosis not present

## 2016-01-04 DIAGNOSIS — E11621 Type 2 diabetes mellitus with foot ulcer: Secondary | ICD-10-CM | POA: Diagnosis not present

## 2016-01-04 DIAGNOSIS — I96 Gangrene, not elsewhere classified: Secondary | ICD-10-CM | POA: Diagnosis not present

## 2016-01-04 DIAGNOSIS — Z7984 Long term (current) use of oral hypoglycemic drugs: Secondary | ICD-10-CM | POA: Diagnosis not present

## 2016-01-04 DIAGNOSIS — L97509 Non-pressure chronic ulcer of other part of unspecified foot with unspecified severity: Secondary | ICD-10-CM | POA: Diagnosis not present

## 2016-01-04 DIAGNOSIS — I70269 Atherosclerosis of native arteries of extremities with gangrene, unspecified extremity: Secondary | ICD-10-CM

## 2016-01-04 DIAGNOSIS — Z23 Encounter for immunization: Secondary | ICD-10-CM

## 2016-01-04 DIAGNOSIS — I743 Embolism and thrombosis of arteries of the lower extremities: Secondary | ICD-10-CM | POA: Diagnosis not present

## 2016-01-04 DIAGNOSIS — I119 Hypertensive heart disease without heart failure: Secondary | ICD-10-CM | POA: Diagnosis present

## 2016-01-04 DIAGNOSIS — L089 Local infection of the skin and subcutaneous tissue, unspecified: Secondary | ICD-10-CM | POA: Diagnosis not present

## 2016-01-04 LAB — PROTIME-INR
INR: 1.05
Prothrombin Time: 13.7 seconds (ref 11.4–15.2)

## 2016-01-04 LAB — CBC
HCT: 34.9 % — ABNORMAL LOW (ref 40.0–52.0)
Hemoglobin: 12 g/dL — ABNORMAL LOW (ref 13.0–18.0)
MCH: 30.5 pg (ref 26.0–34.0)
MCHC: 34.3 g/dL (ref 32.0–36.0)
MCV: 88.8 fL (ref 80.0–100.0)
Platelets: 327 10*3/uL (ref 150–440)
RBC: 3.93 MIL/uL — ABNORMAL LOW (ref 4.40–5.90)
RDW: 12.9 % (ref 11.5–14.5)
WBC: 8.5 10*3/uL (ref 3.8–10.6)

## 2016-01-04 LAB — BASIC METABOLIC PANEL
Anion gap: 5 (ref 5–15)
BUN: 10 mg/dL (ref 6–20)
CO2: 30 mmol/L (ref 22–32)
Calcium: 9.5 mg/dL (ref 8.9–10.3)
Chloride: 102 mmol/L (ref 101–111)
Creatinine, Ser: 0.8 mg/dL (ref 0.61–1.24)
GFR calc Af Amer: >60 mL/min (ref >60)
GFR calc non Af Amer: >60 mL/min (ref >60)
Glucose, Bld: 170 mg/dL — ABNORMAL HIGH (ref 65–99)
Potassium: 4.3 mmol/L (ref 3.5–5.1)
Sodium: 137 mmol/L (ref 135–145)

## 2016-01-04 LAB — APTT: aPTT: 33 seconds (ref 24–36)

## 2016-01-04 LAB — GLUCOSE, CAPILLARY
Glucose-Capillary: 167 mg/dL — ABNORMAL HIGH (ref 65–99)
Glucose-Capillary: 222 mg/dL — ABNORMAL HIGH (ref 65–99)

## 2016-01-04 MED ORDER — PIPERACILLIN-TAZOBACTAM 3.375 G IVPB
3.3750 g | Freq: Three times a day (TID) | INTRAVENOUS | Status: DC
  Administered 2016-01-04 – 2016-01-08 (×11): 3.375 g via INTRAVENOUS
  Filled 2016-01-04 (×11): qty 50

## 2016-01-04 MED ORDER — ONDANSETRON HCL 4 MG/2ML IJ SOLN: 4.0000 mg | Freq: Four times a day (QID) | INTRAMUSCULAR | Status: DC | PRN

## 2016-01-04 MED ORDER — METOPROLOL TARTRATE 50 MG PO TABS
50.0000 mg | ORAL_TABLET | Freq: Two times a day (BID) | ORAL | Status: DC
  Administered 2016-01-04 – 2016-01-08 (×8): 50 mg via ORAL
  Filled 2016-01-04 (×8): qty 1

## 2016-01-04 MED ORDER — HEPARIN SODIUM (PORCINE) 5000 UNIT/ML IJ SOLN
5000.0000 [IU] | Freq: Three times a day (TID) | INTRAMUSCULAR | Status: DC
  Administered 2016-01-04 – 2016-01-05 (×3): 5000 [IU] via SUBCUTANEOUS
  Filled 2016-01-04 (×4): qty 1

## 2016-01-04 MED ORDER — LISINOPRIL 5 MG PO TABS
2.5000 mg | ORAL_TABLET | Freq: Every day | ORAL | Status: DC
  Administered 2016-01-04 – 2016-01-08 (×5): 2.5 mg via ORAL
  Filled 2016-01-04 (×5): qty 1

## 2016-01-04 MED ORDER — VANCOMYCIN HCL IN DEXTROSE 750-5 MG/150ML-% IV SOLN
750.0000 mg | Freq: Two times a day (BID) | INTRAVENOUS | Status: DC
  Administered 2016-01-05 – 2016-01-08 (×7): 750 mg via INTRAVENOUS
  Filled 2016-01-04 (×10): qty 150

## 2016-01-04 MED ORDER — SENNOSIDES-DOCUSATE SODIUM 8.6-50 MG PO TABS: 1.0000 | ORAL_TABLET | Freq: Every evening | ORAL | Status: DC | PRN

## 2016-01-04 MED ORDER — SODIUM CHLORIDE 0.9% FLUSH: 3.0000 mL | INTRAVENOUS | Status: DC | PRN

## 2016-01-04 MED ORDER — SODIUM CHLORIDE 0.9 % IV SOLN: 250.0000 mL | INTRAVENOUS | Status: DC | PRN

## 2016-01-04 MED ORDER — FERROUS SULFATE 325 (65 FE) MG PO TABS
325.0000 mg | ORAL_TABLET | Freq: Every day | ORAL | Status: DC
  Administered 2016-01-05 – 2016-01-08 (×3): 325 mg via ORAL
  Filled 2016-01-04 (×3): qty 1

## 2016-01-04 MED ORDER — ACETAMINOPHEN 325 MG PO TABS: 650.0000 mg | ORAL_TABLET | Freq: Four times a day (QID) | ORAL | Status: DC | PRN

## 2016-01-04 MED ORDER — VANCOMYCIN HCL IN DEXTROSE 1-5 GM/200ML-% IV SOLN
1000.0000 mg | Freq: Once | INTRAVENOUS | Status: AC
  Administered 2016-01-04: 1000 mg via INTRAVENOUS
  Filled 2016-01-04: qty 200

## 2016-01-04 MED ORDER — BISACODYL 5 MG PO TBEC: 5.0000 mg | DELAYED_RELEASE_TABLET | Freq: Every day | ORAL | Status: DC | PRN

## 2016-01-04 MED ORDER — ALBUTEROL SULFATE (2.5 MG/3ML) 0.083% IN NEBU: 2.5000 mg | INHALATION_SOLUTION | RESPIRATORY_TRACT | Status: DC | PRN

## 2016-01-04 MED ORDER — INSULIN ASPART 100 UNIT/ML ~~LOC~~ SOLN
0.0000 [IU] | Freq: Three times a day (TID) | SUBCUTANEOUS | Status: DC
Start: 2016-01-04 — End: 2016-01-08
  Administered 2016-01-04: 2 [IU] via SUBCUTANEOUS
  Administered 2016-01-05: 3 [IU] via SUBCUTANEOUS
  Administered 2016-01-05: 2 [IU] via SUBCUTANEOUS
  Administered 2016-01-06 (×2): 3 [IU] via SUBCUTANEOUS
  Administered 2016-01-06: 1 [IU] via SUBCUTANEOUS
  Administered 2016-01-07: 2 [IU] via SUBCUTANEOUS
  Administered 2016-01-08: 3 [IU] via SUBCUTANEOUS
  Filled 2016-01-04 (×2): qty 3
  Filled 2016-01-04 (×2): qty 2
  Filled 2016-01-04: qty 3
  Filled 2016-01-04: qty 2
  Filled 2016-01-04: qty 1
  Filled 2016-01-04: qty 3

## 2016-01-04 MED ORDER — ONDANSETRON HCL 4 MG PO TABS: 4.0000 mg | ORAL_TABLET | Freq: Four times a day (QID) | ORAL | Status: DC | PRN

## 2016-01-04 MED ORDER — SODIUM CHLORIDE 0.9% FLUSH
3.0000 mL | Freq: Two times a day (BID) | INTRAVENOUS | Status: DC
  Administered 2016-01-04 – 2016-01-08 (×6): 3 mL via INTRAVENOUS

## 2016-01-04 MED ORDER — KETOROLAC TROMETHAMINE 15 MG/ML IJ SOLN: 15.0000 mg | Freq: Four times a day (QID) | INTRAMUSCULAR | Status: DC | PRN

## 2016-01-04 MED ORDER — INSULIN ASPART 100 UNIT/ML ~~LOC~~ SOLN
0.0000 [IU] | Freq: Every day | SUBCUTANEOUS | Status: DC
Start: 2016-01-04 — End: 2016-01-08
  Administered 2016-01-04 – 2016-01-06 (×3): 2 [IU] via SUBCUTANEOUS
  Administered 2016-01-07: 5 [IU] via SUBCUTANEOUS
  Filled 2016-01-04: qty 2
  Filled 2016-01-04: qty 5
  Filled 2016-01-04 (×2): qty 2

## 2016-01-04 MED ORDER — ACETAMINOPHEN 650 MG RE SUPP: 650.0000 mg | Freq: Four times a day (QID) | RECTAL | Status: DC | PRN

## 2016-01-04 MED ORDER — HYDROCODONE-ACETAMINOPHEN 5-325 MG PO TABS: 1.0000 | ORAL_TABLET | ORAL | Status: DC | PRN

## 2016-01-04 NOTE — H&P (Signed)
[facility] [place] [facility]   PATIENT NAME: Jose Schroeder    MR#:  [id number]  DATE OF BIRTH:  [birth year]  DATE OF ADMISSION:  01/04/2016  PRIMARY CARE PHYSICIAN: Elizabeth Sauer, MD   REQUESTING/REFERRING PHYSICIAN: Dr. Ether Griffins  CHIEF COMPLAINT:  No chief complaint on file.  Right second and third toe infection for 1-2 week. HISTORY OF PRESENT ILLNESS:  Jose Schroeder  is a 82 y.o. male with a known history of Hypertension, diabetes. The patient complained of right second and third toe infection for the past several 1-2 weeks. He went to Dr. Ether Griffins office, was found right foot infection and toe necrosis. Dr. Ether Griffins request dmission. The patient denies any fever or chills. PAST MEDICAL HISTORY:   Past Medical History:  Diagnosis Date  . Diabetes mellitus without complication (HCC)   . Hypertension     PAST SURGICAL HISTORY:   Past Surgical History:  Procedure Laterality Date  . TOE SURGERY     amputated 2 toes on L) foot    SOCIAL HISTORY:   Social History  Substance Use Topics  . Smoking status: Never Smoker  . Smokeless tobacco: Not on file  . Alcohol use No    FAMILY HISTORY:   Family History  Problem Relation Age of Onset  . Diabetes Mother   . Hypertension Mother   . Diabetes Father   . Hypertension Father     DRUG ALLERGIES:  No Known Allergies  REVIEW OF SYSTEMS:   Review of Systems  Constitutional: Negative for chills and fever.  HENT: Negative for sore throat.   Eyes: Negative for blurred vision and double vision.  Respiratory: Negative for cough, shortness of breath and wheezing.   Cardiovascular: Negative for chest pain, palpitations, claudication and leg swelling.  Gastrointestinal: Negative for abdominal pain, blood in stool, constipation, diarrhea, melena, nausea and vomiting.  Genitourinary: Negative for dysuria and urgency.  Musculoskeletal: Negative for back pain.       Right foot and toe pain  Skin:  Negative for itching and rash.  Neurological: Negative for dizziness, tingling, focal weakness, seizures, loss of consciousness and headaches.  Psychiatric/Behavioral: Negative for depression.    MEDICATIONS AT HOME:   Prior to Admission medications   Medication Sig Start Date End Date Taking? Authorizing Provider  ferrous sulfate 325 (65 FE) MG tablet Take 1 tablet (325 mg total) by mouth daily with breakfast. 09/24/15   Duanne Limerick, MD  glipiZIDE (GLUCOTROL XL) 10 MG 24 hr tablet Take 1 tablet (10 mg total) by mouth daily with breakfast. 12/10/15   Duanne Limerick, MD  lisinopril (PRINIVIL,ZESTRIL) 2.5 MG tablet TAKE ONE (1) TABLET BY MOUTH ONCE DAILY 12/10/15   Duanne Limerick, MD  metFORMIN (GLUCOPHAGE) 500 MG tablet Take 1 tablet (500 mg total) by mouth 2 (two) times daily. 12/10/15   Duanne Limerick, MD  metoprolol (LOPRESSOR) 50 MG tablet Take 1 tablet (50 mg total) by mouth 2 (two) times daily. 12/22/15   Duanne Limerick, MD  pioglitazone (ACTOS) 15 MG tablet TAKE ONE (1) TABLET BY MOUTH ONCE DAILY 12/10/15   Duanne Limerick, MD      VITAL SIGNS:  Blood pressure (!) 149/86, pulse 66, temperature 98.7 F (37.1 C), temperature source Oral.  PHYSICAL EXAMINATION:  Physical Exam  GENERAL:  82 y.o.-year-old patient lying in the bed with no acute distress.  EYES: Pupils equal, round, reactive to light and accommodation. No scleral icterus. Extraocular muscles intact.  HEENT: Head atraumatic, normocephalic. Oropharynx and nasopharynx clear.  NECK:  Supple, no jugular venous distention. No thyroid enlargement, no tenderness.  LUNGS: Normal breath sounds bilaterally, no wheezing, rales,rhonchi or crepitation. No use of accessory muscles of respiration.  CARDIOVASCULAR: S1, S2 normal. No murmurs, rubs, or gallops.  ABDOMEN: Soft, nontender, nondistended. Bowel sounds present. No organomegaly or mass.  EXTREMITIES: No pedal edema, cyanosis, or clubbing. Right foot in dressing. No pedal pulses,  cold skin in legs. NEUROLOGIC: Cranial nerves II through XII are intact. Muscle strength 5/5 in all extremities. Sensation intact. Gait not checked.  PSYCHIATRIC: The patient is alert and oriented x 3.  SKIN: No obvious rash, lesion, or ulcer.   LABORATORY PANEL:   CBC No results for input(s): WBC, HGB, HCT, PLT in the last 168 hours. ------------------------------------------------------------------------------------------------------------------  Chemistries  No results for input(s): NA, K, CL, CO2, GLUCOSE, BUN, CREATININE, CALCIUM, MG, AST, ALT, ALKPHOS, BILITOT in the last 168 hours.  Invalid input(s): GFRCGP ------------------------------------------------------------------------------------------------------------------  Cardiac Enzymes No results for input(s): TROPONINI in the last 168 hours. ------------------------------------------------------------------------------------------------------------------  RADIOLOGY:  No results found.    IMPRESSION AND PLAN:   Right foot infection Start vancomycin and Zosyn pharmacy to dose. Follow-up CBC and BMP, follow up Dr. Ether GriffinsFowler.  Possible PVD. Vascular surgery consult.  Hypertension. Continue Lopressor Diabetes: Hold the glipizide and start sliding scale.  All the records are reviewed and case discussed with ED provider. Management plans discussed with the patient, family and they are in agreement.  CODE STATUS: full code  TOTAL TIME TAKING CARE OF THIS PATIENT: 55 minutes.    Shaune Pollackhen, Jonna Dittrich M.D on 01/04/2016 at 5:38 PM  Between 7am to 6pm - Pager - [phone]  After 6pm go to [email or web address] - Social research officer, [email or web address] [email or web address]  [facility] [place] [facility]  Office  [phone]  CC: Primary care physician; Elizabeth Sauereanna Jones, MD   Note: This dictation was prepared with Dragon dictation along with smaller phrase technology. Any transcriptional errors that result from this process are unintentional.

## 2016-01-04 NOTE — Progress Notes (Signed)
Received Direct Admit pt to Rm 210 from Dr. Irene LimboFowler's office. Pt AOx4. VSS. No signs of acute distress. Admission completed. Assessment completed. DVT prophylaxis assessed and completed.   Education provided on call bell, bed alarm, telephone and IV/IV Pole/IV Alarms. Education presented on use of [employer] as a Network engineerpatient/nurse communication tool. White Board was completed and updated PRN.   Dietary specification confirmed.   Family at bedside. Pt resting comfortably and quietly w/bed in low and locked position and call bell and telephone within reach. Will continue to monitor.

## 2016-01-04 NOTE — Consult Note (Signed)
Patient Demographics  Jose Schroeder, is a 82 y.o. male   MRN: [id number]   DOB - Apr 05, 1933  Admit Date - 01/04/2016    Outpatient Primary MD for the patient is Elizabeth Sauereanna Jones, MD  Consult requested in the Hospital by Shaune PollackQing Chen, MD, On 01/04/2016    Reason for consult gangrenous changes to the second third toes right foot with increased swelling and redness and infection.   With History of -  Past Medical History:  Diagnosis Date  . Diabetes mellitus without complication (HCC)   . Hypertension       Past Surgical History:  Procedure Laterality Date  . TOE SURGERY     amputated 2 toes on L) foot    in for   No chief complaint on file.    HPI  Jose Schroeder  is a 82 y.o. male, Long-term history of diabetes and peripheral arterial disease. He's had partial amputation to the great toe left foot as well as amputation the lesser digit as well. He's had some problems with wounds on the right foot within slow to heal. He was seen by Dr. Ether GriffinsFowler today in the office and referred here for admission.    Review of Systems  patient is alert and well-oriented does not seem to have any distress at this point he states his foot does hurt a good bit.  Social History Social History  Substance Use Topics  . Smoking status: Never Smoker  . Smokeless tobacco: Not on file  . Alcohol use No     Family History Family History  Problem Relation Age of Onset  . Diabetes Mother   . Hypertension Mother   . Diabetes Father   . Hypertension Father      Prior to Admission medications   Medication Sig Start Date End Date Taking? Authorizing Provider  ferrous sulfate 325 (65 FE) MG tablet Take 1 tablet (325 mg total) by mouth daily with breakfast. 09/24/15   Duanne Limerickeanna C Jones, MD  glipiZIDE (GLUCOTROL XL) 10 MG 24  hr tablet Take 1 tablet (10 mg total) by mouth daily with breakfast. 12/10/15   Duanne Limerickeanna C Jones, MD  lisinopril (PRINIVIL,ZESTRIL) 2.5 MG tablet TAKE ONE (1) TABLET BY MOUTH ONCE DAILY 12/10/15   Duanne Limerickeanna C Jones, MD  metFORMIN (GLUCOPHAGE) 500 MG tablet Take 1 tablet (500 mg total) by mouth 2 (two) times daily. 12/10/15   Duanne Limerickeanna C Jones, MD  metoprolol (LOPRESSOR) 50 MG tablet Take 1 tablet (50 mg total) by mouth 2 (two) times daily. 12/22/15   Duanne Limerickeanna C Jones, MD  pioglitazone (ACTOS) 15 MG tablet TAKE ONE (1) TABLET BY MOUTH ONCE DAILY 12/10/15   Duanne Limerickeanna C Jones, MD    Anti-infectives    None      Scheduled Meds: . heparin  5,000 Units Subcutaneous Q8H  . insulin aspart  0-5 Units Subcutaneous QHS  . insulin aspart  0-9 Units Subcutaneous TID WC  . sodium chloride flush  3 mL Intravenous Q12H   Continuous Infusions:  PRN Meds:.sodium chloride, acetaminophen **OR** acetaminophen, albuterol, bisacodyl, HYDROcodone-acetaminophen, ketorolac, ondansetron **OR** ondansetron (ZOFRAN) IV, senna-docusate, sodium chloride flush  No Known Allergies  Physical Exam  Vitals  Blood  pressure (!) 149/86, pulse 66, temperature 98.7 F (37.1 C), temperature source Oral.  Lower Extremity exam:  Vascular:Pulses are nonpalpable bilateral  Dermatological: Patient has necrotic eschar to the medial second toe extending from the middle phalanx to the nail area. Third toe has more significant necrotic and ischemic changes to it with some deformity of the toes well. Has some erythema to the top of the foot but no significant frank pus admitting from the wounds or toes. Pain with palpation these areas.  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  Ortho: Some contracture deformities second and third toes of the right foot. Left foot has had previous amputations as noted above.  Data Review  CBC No results for input(s): WBC, HGB, HCT, PLT, MCV, MCH,  MCHC, RDW, LYMPHSABS, MONOABS, EOSABS, BASOSABS, BANDABS in the last 168 hours.  Invalid input(s): NEUTRABS, BANDSABD ------------------------------------------------------------------------------------------------------------------  Chemistries  No results for input(s): NA, K, CL, CO2, GLUCOSE, BUN, CREATININE, CALCIUM, MG, AST, ALT, ALKPHOS, BILITOT in the last 168 hours.  Invalid input(s): GFRCGP ------------------------------------------------------------------------------------------------------------------ CrCl cannot be calculated (Unknown ideal weight.). --------------------------------------------------------------------------------------------------------------------------------------------------------------------------------------------------  Urinalysis No results found for: COLORURINE, APPEARANCEUR, LABSPEC, PHURINE, GLUCOSEU, HGBUR, BILIRUBINUR, KETONESUR, PROTEINUR, UROBILINOGEN, NITRITE, LEUKOCYTESUR  Assessment & Plan: Obvious vascular changes to the right foot with necrosis to the second and third toes of the right foot. Patient will ultimately need some surgery to both these digits. Third toe will likely need to be amputated whereas second toe may be able to be debrided if he does not have any worsening of the ischemic gangrenous changes to the toe. Plan: Redressed the toe today but the vascular consult is ordered hopefully they can get to his problematic peripheral arterial disease with a next couple days so that we can move from a surgical standpoint to try and resolve his problems. We will speak with vascular tomorrow about their plans for revascularization. We'll also discuss with Dr. Ether Griffins.  Active Problems:   Toe infection     Family Communication: Plan discussed with patient and **   Thank you for the consult, we will follow the patient with you in the Hospital.   Epimenio Sarin M.D on 01/04/2016 at 5:54 PM  Thank you for the consult, we will follow the  patient with you in the Hospital.

## 2016-01-04 NOTE — Progress Notes (Signed)
Pharmacy Antibiotic Note  Jose Schroeder is a 82 y.o. male admitted on 01/04/2016 with Wound Infection/Diabetic Foot Infection (gangrenous).  Pharmacy has been consulted for Vancomycin and Zosyn dosing.  Plan: Ke: 0.063  T1/2: 11  Vd: 50.4  Will start patient on Vancomycin 1gm IV x 1. Then will start regimen of Vancomycin 750mg  every 12 hours IV with 6 hours stack dosing. Calculated trough at Css is 15. Will order trough prior to 5th dose.  Will start Zosyn 3.375 IV EI every 8 hours.   Will monitor Renal function daily and adjust dose as needed.   Height: 6\' 2"  (188 cm) Weight: 157 lb 14.4 oz (71.6 kg) IBW/kg (Calculated) : 82.2  Temp (24hrs), Avg:98.7 F (37.1 C), Min:98.7 F (37.1 C), Max:98.7 F (37.1 C)   Recent Labs Lab 01/04/16 1741  WBC 8.5  CREATININE 0.80    Estimated Creatinine Clearance: 73.3 mL/min (by C-G formula based on SCr of 0.8 mg/dL).    No Known Allergies  Antimicrobials this admission: 9/11 vancomycin >>  9/11 Zosyn >>   Dose adjustments this admission:  Microbiology:  Thank you for allowing pharmacy to be a part of this patient's care.  Jose Schroeder, PharmD Clinical Pharmacist 01/04/2016 7:21 PM

## 2016-01-05 ENCOUNTER — Inpatient Hospital Stay: Payer: Medicare Other

## 2016-01-05 LAB — GLUCOSE, CAPILLARY
Glucose-Capillary: 81 mg/dL (ref 65–99)
Glucose-Capillary: 248 mg/dL — ABNORMAL HIGH (ref 65–99)
Glucose-Capillary: 193 mg/dL — ABNORMAL HIGH (ref 65–99)
Glucose-Capillary: 219 mg/dL — ABNORMAL HIGH (ref 65–99)

## 2016-01-05 LAB — HEMOGLOBIN A1C: Hgb A1c MFr Bld: 7 % — ABNORMAL HIGH (ref 4.0–6.0)

## 2016-01-05 MED ORDER — ENOXAPARIN SODIUM 40 MG/0.4ML ~~LOC~~ SOLN
40.0000 mg | SUBCUTANEOUS | Status: DC
  Administered 2016-01-05 – 2016-01-07 (×3): 40 mg via SUBCUTANEOUS
  Filled 2016-01-05 (×3): qty 0.4

## 2016-01-05 NOTE — Progress Notes (Signed)
[facility] at [facility]                                                                                                                                                                                            Patient Demographics   Jose Schroeder, is a 82 y.o. male, DOB - December 08, 1932, ZOX:[id number]  Admit date - 01/04/2016   Admitting Physician Shaune Pollack, MD  Outpatient Primary MD for the patient is Elizabeth Sauer, MD   LOS - 1  Subjective: Patient admitted with right-sided foot infection. He was seen by vascular and podiatry. He is awaiting CT angiogram to further evaluate his circulation   Review of Systems:   CONSTITUTIONAL: No documented fever. No fatigue, weakness. No weight gain, no weight loss.  EYES: No blurry or double vision.  ENT: No tinnitus. No postnasal drip. No redness of the oropharynx.  RESPIRATORY: No cough, no wheeze, no hemoptysis. No dyspnea.  CARDIOVASCULAR: No chest pain. No orthopnea. No palpitations. No syncope.  GASTROINTESTINAL: No nausea, no vomiting or diarrhea. No abdominal pain. No melena or hematochezia.  GENITOURINARY: No dysuria or hematuria.  ENDOCRINE: No polyuria or nocturia. No heat or cold intolerance.  HEMATOLOGY: No anemia. No bruising. No bleeding.  INTEGUMENTARY: Redness and swelling of the right toe MUSCULOSKELETAL: No arthritis. No swelling. No gout.  NEUROLOGIC: No numbness, tingling, or ataxia. No seizure-type activity.  PSYCHIATRIC: No anxiety. No insomnia. No ADD.    Vitals:   Vitals:   01/04/16 2059 01/05/16 0516 01/05/16 0950 01/05/16 1216  BP: (!) 158/99 134/67 133/67 134/68  Pulse: 95 77 87 86  Resp: 20 20  20   Temp: 98.7 F (37.1 C) 98.4 F (36.9 C)  98.6 F (37 C)  TempSrc: Oral Oral  Oral  SpO2: 100% 100% 100% 100%  Weight:      Height:        Wt Readings from Last 3 Encounters:  01/04/16 157 lb 14.4 oz (71.6 kg)  09/24/15 163 lb (73.9 kg)  03/04/15 161 lb (73 kg)      Intake/Output Summary (Last 24 hours) at 01/05/16 1240 Last data filed at 01/05/16 1228  Gross per 24 hour  Intake             1243 ml  Output             1375 ml  Net             -132 ml    Physical Exam:   GENERAL: Pleasant-appearing in no apparent distress.  HEAD, EYES, EARS, NOSE AND THROAT: Atraumatic, normocephalic. Extraocular muscles are intact.  Pupils equal and reactive to light. Sclerae anicteric. No conjunctival injection. No oro-pharyngeal erythema.  NECK: Supple. There is no jugular venous distention. No bruits, no lymphadenopathy, no thyromegaly.  HEART: Regular rate and rhythm,. No murmurs, no rubs, no clicks.  LUNGS: Clear to auscultation bilaterally. No rales or rhonchi. No wheezes.  ABDOMEN: Soft, flat, nontender, nondistended. Has good bowel sounds. No hepatosplenomegaly appreciated.  EXTREMITIES: No evidence of any cyanosis, clubbing, or peripheral edema.  Diminished pulses on the right foot  NEUROLOGIC: The patient is alert, awake, and oriented x3 with no focal motor or sensory deficits appreciated bilaterally.  SKIN: Dressing in place on the right foot  Psych: Not anxious, depressed LN: No inguinal LN enlargement    Antibiotics   Anti-infectives    Start     Dose/Rate Route Frequency Ordered Stop   01/05/16 0200  vancomycin (VANCOCIN) IVPB 750 mg/150 ml premix     750 mg 150 mL/hr over 60 Minutes Intravenous Every 12 hours 01/04/16 2005     01/04/16 2000  piperacillin-tazobactam (ZOSYN) IVPB 3.375 g     3.375 g 12.5 mL/hr over 240 Minutes Intravenous Every 8 hours 01/04/16 1905     01/04/16 1915  vancomycin (VANCOCIN) IVPB 1000 mg/200 mL premix     1,000 mg 200 mL/hr over 60 Minutes Intravenous  Once 01/04/16 1906 01/04/16 2114      Medications   Scheduled Meds: . ferrous sulfate  325 mg Oral Q breakfast  . heparin  5,000 Units Subcutaneous Q8H  . insulin aspart  0-5 Units Subcutaneous QHS  . insulin aspart  0-9 Units Subcutaneous TID WC  .  lisinopril  2.5 mg Oral Daily  . metoprolol  50 mg Oral BID  . piperacillin-tazobactam (ZOSYN)  IV  3.375 g Intravenous Q8H  . sodium chloride flush  3 mL Intravenous Q12H  . vancomycin  750 mg Intravenous Q12H   Continuous Infusions:  PRN Meds:.sodium chloride, acetaminophen **OR** acetaminophen, albuterol, bisacodyl, HYDROcodone-acetaminophen, ketorolac, ondansetron **OR** ondansetron (ZOFRAN) IV, senna-docusate, sodium chloride flush   Data Review:   Micro Results No results found for this or any previous visit (from the past 240 hour(s)).  Radiology Reports Ct Angio Ao+bifem W &/or Wo Contrast  Result Date: 01/05/2016 CLINICAL DATA:  82 year old male with a history of cellulitis and right foot gangrene. No palpable pulses. EXAM: CT ANGIOGRAPHY OF ABDOMINAL AORTA WITH ILIOFEMORAL RUNOFF TECHNIQUE: Multidetector CT imaging of the abdomen, pelvis and lower extremities was performed using the standard protocol during bolus administration of intravenous contrast. Multiplanar CT image reconstructions and MIPs were obtained to evaluate the vascular anatomy. CONTRAST:  100 cc IV contrast COMPARISON:  No CT comparison available. No noninvasive study available. Angiogram of the left lower extremity 11/12/2012 FINDINGS: VASCULAR Aorta: Mixed soft plaque and calcified plaque of the abdominal aorta. No dissection, aneurysm, or periaortic fluid. Celiac: Calcifications at the origin of the celiac artery with less than 50% stenosis. Celiac artery maintains patency. Typical branching pattern of the celiac artery. SMA: SMA remains patent. Calcifications at the origin with no significant stenosis. Distal SMA branches remain patent. Renals: Bilateral renal arteries with atherosclerotic changes on the right. No significant stenosis. IMA: Inferior mesenteric artery remains patent with atherosclerotic plaque at the origin. RIGHT Lower Extremity Right common iliac artery with calcifications at the origin. No  dissection, aneurysm, or occlusion. Hypogastric artery remains patent. External iliac artery is patent. Surgical changes of the right inguinal region. Runoff: Profunda femoris and circumflex femoral artery remain patent. Mild atherosclerotic  disease of the right superficial femoral artery, with calcified plaque at the canal. Popliteal artery demonstrates significant plaque in the below-knee segment, with likely 50% stenosis at the joint space secondary to calcified plaque. Origin of the anterior tibial artery patent. Tibioperoneal trunk appears patent. Beyond the proximal third there is occlusion of the anterior tibial artery. Posterior tibial artery is partially opacified, although calcium limits evaluation in the distal leg. Peroneal artery appears occluded above the ankle. LEFT Lower Extremity Left common iliac artery demonstrates atherosclerotic plaque with no significant stenosis, occlusion, aneurysm, dissection flap. Hypogastric artery remains patent. External iliac artery is patent as well as common femoral artery. Minimal plaque at the common femoral artery. Profunda femoris and circumflex femoral artery patent. Runoff: Mixed soft plaque and calcified plaque of the left superficial femoral artery, although the artery is patent to the popliteal artery. Mixed calcified and soft plaque of the popliteal artery, which is patent to the trifurcation. Anterior tibial artery is occluded just after the origin. Tibioperoneal trunk is patent. The proximal posterior tibial artery and peroneal artery patent, although there is occlusion of the posterior tibial artery above the ankle. The peroneal artery appears to be patent to the ankle. Veins: Unremarkable Review of the MIP images confirms the above findings. NON-VASCULAR Lower chest: Heart size normal limits. No pericardial fluid/ thickening. Coronary calcifications are evident. Small hiatal hernia. No confluent airspace disease. No pleural effusion. Chronic lung changes  with scarring/atelectasis. Hepatobiliary: Nodular contour of the liver, raising the concern for cirrhosis. Unremarkable appearance of gallbladder. Pancreas: Vascular calcifications versus chronic coarsened calcifications of pancreatic parenchyma. Spleen: Unremarkable spleen Adrenals/Urinary Tract: Unremarkable adrenal glands. Unremarkable appearance of the bilateral kidneys. Unremarkable course the bilateral ureters. Unremarkable appearance of the urinary bladder, partially distended. Stomach/Bowel: Unremarkable appearance of stomach with small hiatal hernia. Duodenum diverticulum with otherwise unremarkable small bowel. Colonic diverticula. No current inflammatory changes evident. Normal appendix. Lymphatic: No lymphadenopathy. Reproductive: Unremarkable appearance of prostate. Fact containing right inguinal hernia. Other: None Musculoskeletal: No asymmetric swelling of the lower extremities. No displaced fracture identified. Degenerative changes at the bilateral hips and the bilateral knee joints. Minimal degenerative changes. Fat lesion within the canal at the L1-L2 level with cranial caudal span of 3.3 cm. IMPRESSION: VASCULAR Atherosclerotic changes, with vascular disease involving the aorta, iliac vessels, bilateral femoral-popliteal arteries and tibial vessels. The preponderance of disease is within the tibial vasculature bilaterally, with apparent single vessel runoff on the left via the peroneal artery, and likely discontinuous flow to the ankle on the right. Establishing location of occlusion/stenoses of the bilateral lower legs difficult given the degree of calcium. Native coronary artery disease. NON-VASCULAR **An incidental finding of potential clinical significance has been found. Fat attenuation lesion within the canal at the L1-L2 level, which may be intradural or extradural, potentially dermoid or epidermoid tumor. Further evaluation with lumbar MRI is indicated.** Nodular contour of the liver,  favored to represent early cirrhosis. Correlation with patient history and presentation may be useful. Consider gastroenterology referral for potential initiation of HCC surveillance. Coarsened calcifications within the pancreas. At least some of these are vascular, however, given the appearance of the liver, there is likely an element of chronic pancreatitis. Correlation with history of pancreatitis may be also useful. Diverticular disease with no acute findings. These results will be called to the ordering clinician or representative by the Radiologist Assistant, and communication documented in the PACS or zVision Dashboard. Signed, Yvone NeuJaime S. Loreta AveWagner, DO [facility] Electronically Signed  By: Gilmer Mor D.O.   On: 01/05/2016 12:29     CBC  Recent Labs Lab 01/04/16 1741  WBC 8.5  HGB 12.0*  HCT 34.9*  PLT 327  MCV 88.8  MCH 30.5  MCHC 34.3  RDW 12.9    Chemistries   Recent Labs Lab 01/04/16 1741  NA 137  K 4.3  CL 102  CO2 30  GLUCOSE 170*  BUN 10  CREATININE 0.80  CALCIUM 9.5   ------------------------------------------------------------------------------------------------------------------ estimated creatinine clearance is 73.3 mL/min (by C-G formula based on SCr of 0.8 mg/dL). ------------------------------------------------------------------------------------------------------------------ No results for input(s): HGBA1C in the last 72 hours. ------------------------------------------------------------------------------------------------------------------ No results for input(s): CHOL, HDL, LDLCALC, TRIG, CHOLHDL, LDLDIRECT in the last 72 hours. ------------------------------------------------------------------------------------------------------------------ No results for input(s): TSH, T4TOTAL, T3FREE, THYROIDAB in the last 72 hours.  Invalid input(s):  FREET3 ------------------------------------------------------------------------------------------------------------------ No results for input(s): VITAMINB12, FOLATE, FERRITIN, TIBC, IRON, RETICCTPCT in the last 72 hours.  Coagulation profile  Recent Labs Lab 01/04/16 1741  INR 1.05    No results for input(s): DDIMER in the last 72 hours.  Cardiac Enzymes No results for input(s): CKMB, TROPONINI, MYOGLOBIN in the last 168 hours.  Invalid input(s): CK ------------------------------------------------------------------------------------------------------------------ Invalid input(s): POCBNP    Assessment & Plan  Patient's 82 year old with right foot infection  #1 Right foot infection with gangrenous changes of the second third toe right foot Continue antibiotics with vancomycin and Zosyn Currently being worked up for vascular disease with a CT [facility] will plan to amputate the toe once vascular evaluations done  #2 Possible PVD. Vascular surgery consult appreciated CT of angio pending  #3 Hypertension. Continue Lopressor blood pressure stable  #4 Diabetes: Hold glipizide may need long-acting insulin while in the hospital Continue sliding scale insulin     Code Status Orders        Start     Ordered   01/04/16 1646  Full code  Continuous     01/04/16 1645    Code Status History    Date Active Date Inactive Code Status Order ID Comments User Context   This patient has a current code status but no historical code status.           Consults  Vascular and podiatry   DVT Prophylaxis  heparin  Lab Results  Component Value Date   PLT 327 01/04/2016     Time Spent in minutes  Greater than 50% of time spent in care coordination and counseling patient regarding the condition and plan of care.   Auburn Bilberry M.D on 01/05/2016 at 12:40 PM  Between 7am to 6pm - Pager - [phone]  After 6pm go to [email or web address] - password [email or web address]  [facility]  [facility]   Office  [phone]

## 2016-01-05 NOTE — Progress Notes (Signed)
Pharmacy Antibiotic Note  Jose Schroeder is a 82 y.o. male admitted on 01/04/2016 with Wound Infection/Diabetic Foot Infection (gangrenous).  Pharmacy has been consulted for Vancomycin and Zosyn dosing.  Plan: Ke: 0.063  T1/2: 11  Vd: 50.4  Patient received Vancomycin 1gm x1 in the ED. Current orders are for Vancomycin 750mg  every 12 hours. Calculated trough at Css is 15. Will order trough prior to 5th dose. Goal trough 15-20.   Will start Zosyn 3.375 IV EI every 8 hours.   Will monitor Renal function daily and adjust dose as needed.   Height: 6\' 2"  (188 cm) Weight: 157 lb 14.4 oz (71.6 kg) IBW/kg (Calculated) : 82.2  Temp (24hrs), Avg:98.6 F (37 C), Min:98.4 F (36.9 C), Max:98.7 F (37.1 C)   Recent Labs Lab 01/04/16 1741  WBC 8.5  CREATININE 0.80    Estimated Creatinine Clearance: 73.3 mL/min (by C-G formula based on SCr of 0.8 mg/dL).    No Known Allergies  Antimicrobials this admission: 9/11 vancomycin >>  9/11 Zosyn >>   Dose adjustments this admission:  Microbiology:  Thank you for allowing pharmacy to be a part of this patient's care.  Jose Schroeder, PharmD Clinical Pharmacist 01/05/2016 1:29 PM

## 2016-01-05 NOTE — Plan of Care (Signed)
Problem: Pain Managment: Goal: General experience of comfort will improve Outcome: Progressing Pt has not had any C/O pain this shift.    

## 2016-01-05 NOTE — Consult Note (Signed)
[facility] Vascular Consult Note  MRN : [id number]  Jose Schroeder is a 82 y.o. ([birth year]) male who presents with chief complaint of .  History of Present Illness: I am asked to evaluate the patient by Dr. Orland Jarred for cellulitis and gangrene of the right foot associated with lack of pulses. Patient denies claudication type symptoms. He states until his infection started he was not having any pain. He does admit to having had amputations of toes on the left foot. These of healed well. He denies any past angiography or interventions. There is no history of aneurysmal disease. He denies a family history of aneurysms.  He denies fever chills he states the reason he came to see Dr. Ether Griffins was for pain.  Current Facility-Administered Medications  Medication Dose Route Frequency Provider Last Rate Last Dose  . 0.9 %  sodium chloride infusion  250 mL Intravenous PRN Shaune Pollack, MD      . acetaminophen (TYLENOL) tablet 650 mg  650 mg Oral Q6H PRN Shaune Pollack, MD       Or  . acetaminophen (TYLENOL) suppository 650 mg  650 mg Rectal Q6H PRN Shaune Pollack, MD      . albuterol (PROVENTIL) (2.5 MG/3ML) 0.083% nebulizer solution 2.5 mg  2.5 mg Nebulization Q2H PRN Shaune Pollack, MD      . bisacodyl (DULCOLAX) EC tablet 5 mg  5 mg Oral Daily PRN Shaune Pollack, MD      . ferrous sulfate tablet 325 mg  325 mg Oral Q breakfast Shaune Pollack, MD      . heparin injection 5,000 Units  5,000 Units Subcutaneous Q8H Shaune Pollack, MD   5,000 Units at 01/05/16 [phone]  . HYDROcodone-acetaminophen (NORCO/VICODIN) 5-325 MG per tablet 1-2 tablet  1-2 tablet Oral Q4H PRN Shaune Pollack, MD      . insulin aspart (novoLOG) injection 0-5 Units  0-5 Units Subcutaneous QHS Shaune Pollack, MD   2 Units at 01/04/16 2131  . insulin aspart (novoLOG) injection 0-9 Units  0-9 Units Subcutaneous TID WC Shaune Pollack, MD   2 Units at 01/04/16 1817  . ketorolac (TORADOL) 15 MG/ML injection 15 mg  15 mg Intravenous Q6H PRN Shaune Pollack, MD       . lisinopril (PRINIVIL,ZESTRIL) tablet 2.5 mg  2.5 mg Oral Daily Shaune Pollack, MD   2.5 mg at 01/04/16 2013  . metoprolol (LOPRESSOR) tablet 50 mg  50 mg Oral BID Shaune Pollack, MD   50 mg at 01/04/16 2132  . ondansetron (ZOFRAN) tablet 4 mg  4 mg Oral Q6H PRN Shaune Pollack, MD       Or  . ondansetron [facility]) injection 4 mg  4 mg Intravenous Q6H PRN Shaune Pollack, MD      . piperacillin-tazobactam (ZOSYN) IVPB 3.375 g  3.375 g Intravenous Q8H Sheema M Hallaji, RPH   3.375 g at 01/05/16 0523  . senna-docusate (Senokot-S) tablet 1 tablet  1 tablet Oral QHS PRN Shaune Pollack, MD      . sodium chloride flush (NS) 0.9 % injection 3 mL  3 mL Intravenous Q12H Shaune Pollack, MD   3 mL at 01/04/16 2136  . sodium chloride flush (NS) 0.9 % injection 3 mL  3 mL Intravenous PRN Shaune Pollack, MD      . vancomycin (VANCOCIN) IVPB 750 mg/150 ml premix  750 mg Intravenous Q12H Sheema M Hallaji, RPH   750 mg at 01/05/16 0204    Past Medical History:  Diagnosis Date  .  Diabetes mellitus without complication (HCC)   . Hypertension     Past Surgical History:  Procedure Laterality Date  . TOE SURGERY     amputated 2 toes on L) foot    Social History Social History  Substance Use Topics  . Smoking status: Never Smoker  . Smokeless tobacco: Not on file  . Alcohol use No    Family History Family History  Problem Relation Age of Onset  . Diabetes Mother   . Hypertension Mother   . Diabetes Father   . Hypertension Father   No family history bleeding clotting disorders porphyria or autoimmune disease.  No Known Allergies   REVIEW OF SYSTEMS (Negative unless checked)  Constitutional: [] Weight loss  [] Fever  [] Chills Cardiac: [] Chest pain   [] Chest pressure   [] Palpitations   [] Shortness of breath when laying flat   [] Shortness of breath at rest   [] Shortness of breath with exertion. Vascular:  [] Pain in legs with walking   [] Pain in legs at rest   [] Pain in legs when laying flat   [] Claudication   [] Pain in feet when  walking  [x] Pain in feet at rest  [x] Pain in feet when laying flat   [] History of DVT   [] Phlebitis   [] Swelling in legs   [] Varicose veins   [x] Non-healing ulcers Pulmonary:   [] Uses home oxygen   [] Productive cough   [] Hemoptysis   [] Wheeze  [] COPD   [] Asthma Neurologic:  [] Dizziness  [] Blackouts   [] Seizures   [] History of stroke   [] History of TIA  [] Aphasia   [] Temporary blindness   [] Dysphagia   [] Weakness or numbness in arms   [] Weakness or numbness in legs Musculoskeletal:  [] Arthritis   [] Joint swelling   [] Joint pain   [] Low back pain Hematologic:  [] Easy bruising  [] Easy bleeding   [] Hypercoagulable state   [] Anemic  [] Hepatitis Gastrointestinal:  [] Blood in stool   [] Vomiting blood  [] Gastroesophageal reflux/heartburn   [] Difficulty swallowing. Genitourinary:  [] Chronic kidney disease   [] Difficult urination  [] Frequent urination  [] Burning with urination   [] Blood in urine Skin:  [] Rashes   [] Ulcers   [] Wounds Psychological:  [] History of anxiety   []  History of major depression.  Physical Examination  Vitals:   01/04/16 1600 01/04/16 2059 01/05/16 0516  BP: (!) 149/86 (!) 158/99 134/67  Pulse: 66 95 77  Resp:  20 20  Temp: 98.7 F (37.1 C) 98.7 F (37.1 C) 98.4 F (36.9 C)  TempSrc: Oral Oral Oral  SpO2:  100% 100%  Weight: 71.6 kg (157 lb 14.4 oz)    Height: 6\' 2"  (1.88 m)     Body mass index is 20.27 kg/m. Gen:  WD/WN, NAD Head: Levan/AT, No temporalis wasting. Prominent temp pulse not noted. Ear/Nose/Throat: Hearing grossly intact, nares w/o erythema or drainage, oropharynx w/o Erythema/Exudate Eyes: PERRLA, EOMI.  Neck: Supple, no nuchal rigidity.  No bruit or JVD.  Pulmonary:  Good air movement, clear to auscultation bilaterally.  Cardiac: RRR, normal S1, S2, no Murmurs, rubs or gallops. Vascular:  Vessel Right Left  Radial Palpable Palpable  Ulnar Palpable Palpable  Brachial Palpable Palpable  Carotid Palpable, without bruit Palpable, without bruit  Aorta  Enlarged and palpable N/A  Femoral 2+ Palpable Not Palpable  Popliteal Not Palpable Not Palpable  PT Not Palpable Not Palpable  DP Not Palpable Not Palpable   Gastrointestinal: soft, non-tender/non-distended. No guarding/reflex. Pulsatile mass.  No surgical incisions, or scars. Musculoskeletal: M/S 5/5 throughout.  Extremities with ischemic changes on  the right and gangrenous changes of the toes.  Well-healed amputations of the left great toe (partial) and left fourth toe No edema. Neurologic: CN 2-12 intact. Pain and light touch intact in extremities.  Symmetrical.  Speech is fluent. Motor exam as listed above. Psychiatric: Judgment intact, Mood & affect appropriate for pt's clinical situation. Dermatologic: No rashes noted.  Right forefoot cellulitis or open wounds. Lymph : No Cervical, Axillary, or Inguinal lymphadenopathy.    CBC Lab Results  Component Value Date   WBC 8.5 01/04/2016   HGB 12.0 (L) 01/04/2016   HCT 34.9 (L) 01/04/2016   MCV 88.8 01/04/2016   PLT 327 01/04/2016    BMET    Component Value Date/Time   NA 137 01/04/2016 1741   NA 141 09/24/2015 1426   NA 141 11/12/2012 0717   K 4.3 01/04/2016 1741   K 4.0 11/12/2012 0717   CL 102 01/04/2016 1741   CL 106 11/12/2012 0717   CO2 30 01/04/2016 1741   CO2 31 11/12/2012 0717   GLUCOSE 170 (H) 01/04/2016 1741   GLUCOSE 149 (H) 11/12/2012 0717   BUN 10 01/04/2016 1741   BUN 13 09/24/2015 1426   BUN 10 11/12/2012 0717   CREATININE 0.80 01/04/2016 1741   CREATININE 0.99 11/12/2012 0717   CALCIUM 9.5 01/04/2016 1741   CALCIUM 9.4 11/12/2012 0717   GFRNONAA >60 01/04/2016 1741   GFRNONAA >60 11/12/2012 0717   GFRAA >60 01/04/2016 1741   GFRAA >60 11/12/2012 0717   Estimated Creatinine Clearance: 73.3 mL/min (by C-G formula based on SCr of 0.8 mg/dL).  COAG Lab Results  Component Value Date   INR 1.05 01/04/2016    Radiology No results found.  Assessment/Plan 1.  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 Given these findings he will require imaging and intervention or bypass for limb salvage. He is at high risk for amputation of his foot and lower leg. Given the fact that I have to study his right leg and there is a nonpalpable left femoral I will begin my evaluation with a CT angiogram. This will also help given the broadened impulse found on physical examination consistent with an abdominal aortic aneurysm. The risks were discussed with the patient he agrees.  2.  Broadened aortic impulse/abdominal aortic aneurysm:  Given this finding and physical examination I will obtain a CT as discussed above.  3.  Right foot infection:  Continue vancomycin and Zosyn pharmacy to dose. Follow-up CBC and BMP, podiatry is on consult .  4.  Hypertension: Continue Lopressor  5.  Diabetes: Hold the glipizide and start sliding scale.   Leverne Tessler, Latina CraverGregory G, MD  01/05/2016 7:45 AM

## 2016-01-05 NOTE — Progress Notes (Signed)
F/u right foot gangrenous changes. Vascular has seen and CT with runoff ordered. Will await further vascular evaluation and plan. C/W local wound care to right foot. Will follow.

## 2016-01-06 ENCOUNTER — Encounter: Payer: Self-pay | Admitting: *Deleted

## 2016-01-06 LAB — BASIC METABOLIC PANEL
Anion gap: 7 (ref 5–15)
BUN: 11 mg/dL (ref 6–20)
CO2: 28 mmol/L (ref 22–32)
Calcium: 8.9 mg/dL (ref 8.9–10.3)
Chloride: 104 mmol/L (ref 101–111)
Creatinine, Ser: 0.93 mg/dL (ref 0.61–1.24)
GFR calc Af Amer: >60 mL/min (ref >60)
GFR calc non Af Amer: >60 mL/min (ref >60)
Glucose, Bld: 138 mg/dL — ABNORMAL HIGH (ref 65–99)
Potassium: 3.9 mmol/L (ref 3.5–5.1)
Sodium: 139 mmol/L (ref 135–145)

## 2016-01-06 LAB — CBC
HCT: 30.4 % — ABNORMAL LOW (ref 40.0–52.0)
Hemoglobin: 10.5 g/dL — ABNORMAL LOW (ref 13.0–18.0)
MCH: 30.2 pg (ref 26.0–34.0)
MCHC: 34.5 g/dL (ref 32.0–36.0)
MCV: 87.7 fL (ref 80.0–100.0)
Platelets: 280 10*3/uL (ref 150–440)
RBC: 3.47 MIL/uL — ABNORMAL LOW (ref 4.40–5.90)
RDW: 12.9 % (ref 11.5–14.5)
WBC: 7 10*3/uL (ref 3.8–10.6)

## 2016-01-06 LAB — GLUCOSE, CAPILLARY
Glucose-Capillary: 121 mg/dL — ABNORMAL HIGH (ref 65–99)
Glucose-Capillary: 226 mg/dL — ABNORMAL HIGH (ref 65–99)
Glucose-Capillary: 212 mg/dL — ABNORMAL HIGH (ref 65–99)
Glucose-Capillary: 221 mg/dL — ABNORMAL HIGH (ref 65–99)

## 2016-01-06 LAB — VANCOMYCIN, TROUGH: Vancomycin Tr: 14 ug/mL — ABNORMAL LOW (ref 15–20)

## 2016-01-06 MED ORDER — INSULIN DETEMIR 100 UNIT/ML ~~LOC~~ SOLN
6.0000 [IU] | Freq: Every day | SUBCUTANEOUS | Status: DC
  Administered 2016-01-06: 6 [IU] via SUBCUTANEOUS
  Filled 2016-01-06: qty 0.06

## 2016-01-06 MED ORDER — SODIUM CHLORIDE 0.9 % IV SOLN
INTRAVENOUS | Status: DC
  Administered 2016-01-07 – 2016-01-08 (×2): via INTRAVENOUS

## 2016-01-06 MED ORDER — CEFAZOLIN IN D5W 1 GM/50ML IV SOLN
1.0000 g | INTRAVENOUS | Status: DC
  Filled 2016-01-06: qty 50

## 2016-01-06 NOTE — Care Management (Signed)
Patient admitted with Right foot infection with gangrenous changes of the second third toe right foot.  Patient lives at home with his wife. Obtains medications from [facility] and [facility].  Podiatry and vascular following.  Reported that plan is for patient to have amputation of digitst/digits on Thursday.  PT will need to evaluate post procedure.  RNCM following for discharge needs

## 2016-01-06 NOTE — Progress Notes (Signed)
[facility] - [facility]                                                                                                                                                                                            Patient Demographics   Jose Schroeder, is a 82 y.o. male, DOB - 07/15/1932, BJY:[id number]  Admit date - 01/04/2016   Admitting Physician Shaune Pollack, MD  Outpatient Primary MD for the patient is Elizabeth Sauer, MD   LOS - 2  Subjective: Complains of pain in the right foot Awaiting further evaluation from vascular and podiatry   Review of Systems:   CONSTITUTIONAL: No documented fever. No fatigue, weakness. No weight gain, no weight loss.  EYES: No blurry or double vision.  ENT: No tinnitus. No postnasal drip. No redness of the oropharynx.  RESPIRATORY: No cough, no wheeze, no hemoptysis. No dyspnea.  CARDIOVASCULAR: No chest pain. No orthopnea. No palpitations. No syncope.  GASTROINTESTINAL: No nausea, no vomiting or diarrhea. No abdominal pain. No melena or hematochezia.  GENITOURINARY: No dysuria or hematuria.  ENDOCRINE: No polyuria or nocturia. No heat or cold intolerance.  HEMATOLOGY: No anemia. No bruising. No bleeding.  INTEGUMENTARY: Redness and swelling of the right toe MUSCULOSKELETAL: No arthritis. No swelling. No gout. Right foot pain NEUROLOGIC: No numbness, tingling, or ataxia. No seizure-type activity.  PSYCHIATRIC: No anxiety. No insomnia. No ADD.    Vitals:   Vitals:   01/05/16 0950 01/05/16 1216 01/05/16 2033 01/06/16 0953  BP: 133/67 134/68 124/63 139/60  Pulse: 87 86 79 64  Resp:  20 18   Temp:  98.6 F (37 C) 98.4 F (36.9 C)   TempSrc:  Oral Oral   SpO2: 100% 100% 97%   Weight:      Height:        Wt Readings from Last 3 Encounters:  01/04/16 157 lb 14.4 oz (71.6 kg)  09/24/15 163 lb (73.9 kg)  03/04/15 161 lb (73 kg)     Intake/Output Summary (Last 24 hours) at 01/06/16 1214 Last data filed at  01/06/16 0932  Gross per 24 hour  Intake           729.29 ml  Output             1775 ml  Net         -1045.71 ml    Physical Exam:   GENERAL: Pleasant-appearing in no apparent distress.  HEAD, EYES, EARS, NOSE AND THROAT: Atraumatic, normocephalic. Extraocular muscles are intact. Pupils equal and reactive to light. Sclerae anicteric. No conjunctival injection. No oro-pharyngeal erythema.  NECK: Supple.  There is no jugular venous distention. No bruits, no lymphadenopathy, no thyromegaly.  HEART: Regular rate and rhythm,. No murmurs, no rubs, no clicks.  LUNGS: Clear to auscultation bilaterally. No rales or rhonchi. No wheezes.  ABDOMEN: Soft, flat, nontender, nondistended. Has good bowel sounds. No hepatosplenomegaly appreciated.  EXTREMITIES: No evidence of any cyanosis, clubbing, or peripheral edema.  Diminished pulses on the right foot  NEUROLOGIC: The patient is alert, awake, and oriented x3 with no focal motor or sensory deficits appreciated bilaterally.  SKIN: Dressing in place on the right foot  Psych: Not anxious, depressed LN: No inguinal LN enlargement    Antibiotics   Anti-infectives    Start     Dose/Rate Route Frequency Ordered Stop   01/05/16 0200  vancomycin (VANCOCIN) IVPB 750 mg/150 ml premix     750 mg 150 mL/hr over 60 Minutes Intravenous Every 12 hours 01/04/16 2005     01/04/16 2000  piperacillin-tazobactam (ZOSYN) IVPB 3.375 g     3.375 g 12.5 mL/hr over 240 Minutes Intravenous Every 8 hours 01/04/16 1905     01/04/16 1915  vancomycin (VANCOCIN) IVPB 1000 mg/200 mL premix     1,000 mg 200 mL/hr over 60 Minutes Intravenous  Once 01/04/16 1906 01/04/16 2114      Medications   Scheduled Meds: . enoxaparin (LOVENOX) injection  40 mg Subcutaneous Q24H  . ferrous sulfate  325 mg Oral Q breakfast  . insulin aspart  0-5 Units Subcutaneous QHS  . insulin aspart  0-9 Units Subcutaneous TID WC  . lisinopril  2.5 mg Oral Daily  . metoprolol  50 mg Oral BID  .  piperacillin-tazobactam (ZOSYN)  IV  3.375 g Intravenous Q8H  . sodium chloride flush  3 mL Intravenous Q12H  . vancomycin  750 mg Intravenous Q12H   Continuous Infusions:  PRN Meds:.sodium chloride, acetaminophen **OR** acetaminophen, albuterol, bisacodyl, HYDROcodone-acetaminophen, ketorolac, ondansetron **OR** ondansetron (ZOFRAN) IV, senna-docusate, sodium chloride flush   Data Review:   Micro Results No results found for this or any previous visit (from the past 240 hour(s)).  Radiology Reports Ct Angio Ao+bifem W &/or Wo Contrast  Result Date: 01/05/2016 CLINICAL DATA:  82 year old male with a history of cellulitis and right foot gangrene. No palpable pulses. EXAM: CT ANGIOGRAPHY OF ABDOMINAL AORTA WITH ILIOFEMORAL RUNOFF TECHNIQUE: Multidetector CT imaging of the abdomen, pelvis and lower extremities was performed using the standard protocol during bolus administration of intravenous contrast. Multiplanar CT image reconstructions and MIPs were obtained to evaluate the vascular anatomy. CONTRAST:  100 cc IV contrast COMPARISON:  No CT comparison available. No noninvasive study available. Angiogram of the left lower extremity 11/12/2012 FINDINGS: VASCULAR Aorta: Mixed soft plaque and calcified plaque of the abdominal aorta. No dissection, aneurysm, or periaortic fluid. Celiac: Calcifications at the origin of the celiac artery with less than 50% stenosis. Celiac artery maintains patency. Typical branching pattern of the celiac artery. SMA: SMA remains patent. Calcifications at the origin with no significant stenosis. Distal SMA branches remain patent. Renals: Bilateral renal arteries with atherosclerotic changes on the right. No significant stenosis. IMA: Inferior mesenteric artery remains patent with atherosclerotic plaque at the origin. RIGHT Lower Extremity Right common iliac artery with calcifications at the origin. No dissection, aneurysm, or occlusion. Hypogastric artery remains patent.  External iliac artery is patent. Surgical changes of the right inguinal region. Runoff: Profunda femoris and circumflex femoral artery remain patent. Mild atherosclerotic disease of the right superficial femoral artery, with calcified plaque at the canal. Popliteal artery  demonstrates significant plaque in the below-knee segment, with likely 50% stenosis at the joint space secondary to calcified plaque. Origin of the anterior tibial artery patent. Tibioperoneal trunk appears patent. Beyond the proximal third there is occlusion of the anterior tibial artery. Posterior tibial artery is partially opacified, although calcium limits evaluation in the distal leg. Peroneal artery appears occluded above the ankle. LEFT Lower Extremity Left common iliac artery demonstrates atherosclerotic plaque with no significant stenosis, occlusion, aneurysm, dissection flap. Hypogastric artery remains patent. External iliac artery is patent as well as common femoral artery. Minimal plaque at the common femoral artery. Profunda femoris and circumflex femoral artery patent. Runoff: Mixed soft plaque and calcified plaque of the left superficial femoral artery, although the artery is patent to the popliteal artery. Mixed calcified and soft plaque of the popliteal artery, which is patent to the trifurcation. Anterior tibial artery is occluded just after the origin. Tibioperoneal trunk is patent. The proximal posterior tibial artery and peroneal artery patent, although there is occlusion of the posterior tibial artery above the ankle. The peroneal artery appears to be patent to the ankle. Veins: Unremarkable Review of the MIP images confirms the above findings. NON-VASCULAR Lower chest: Heart size normal limits. No pericardial fluid/ thickening. Coronary calcifications are evident. Small hiatal hernia. No confluent airspace disease. No pleural effusion. Chronic lung changes with scarring/atelectasis. Hepatobiliary: Nodular contour of the  liver, raising the concern for cirrhosis. Unremarkable appearance of gallbladder. Pancreas: Vascular calcifications versus chronic coarsened calcifications of pancreatic parenchyma. Spleen: Unremarkable spleen Adrenals/Urinary Tract: Unremarkable adrenal glands. Unremarkable appearance of the bilateral kidneys. Unremarkable course the bilateral ureters. Unremarkable appearance of the urinary bladder, partially distended. Stomach/Bowel: Unremarkable appearance of stomach with small hiatal hernia. Duodenum diverticulum with otherwise unremarkable small bowel. Colonic diverticula. No current inflammatory changes evident. Normal appendix. Lymphatic: No lymphadenopathy. Reproductive: Unremarkable appearance of prostate. Fact containing right inguinal hernia. Other: None Musculoskeletal: No asymmetric swelling of the lower extremities. No displaced fracture identified. Degenerative changes at the bilateral hips and the bilateral knee joints. Minimal degenerative changes. Fat lesion within the canal at the L1-L2 level with cranial caudal span of 3.3 cm. IMPRESSION: VASCULAR Atherosclerotic changes, with vascular disease involving the aorta, iliac vessels, bilateral femoral-popliteal arteries and tibial vessels. The preponderance of disease is within the tibial vasculature bilaterally, with apparent single vessel runoff on the left via the peroneal artery, and likely discontinuous flow to the ankle on the right. Establishing location of occlusion/stenoses of the bilateral lower legs difficult given the degree of calcium. Native coronary artery disease. NON-VASCULAR **An incidental finding of potential clinical significance has been found. Fat attenuation lesion within the canal at the L1-L2 level, which may be intradural or extradural, potentially dermoid or epidermoid tumor. Further evaluation with lumbar MRI is indicated.** Nodular contour of the liver, favored to represent early cirrhosis. Correlation with patient  history and presentation may be useful. Consider gastroenterology referral for potential initiation of HCC surveillance. Coarsened calcifications within the pancreas. At least some of these are vascular, however, given the appearance of the liver, there is likely an element of chronic pancreatitis. Correlation with history of pancreatitis may be also useful. Diverticular disease with no acute findings. These results will be called to the ordering clinician or representative by the Radiologist Assistant, and communication documented in the PACS or zVision Dashboard. Signed, Yvone Neu. Loreta Ave, DO [facility] Electronically Signed   By: Gilmer Mor D.O.   On: 01/05/2016 12:29  CBC  Recent Labs Lab 01/04/16 1741 01/06/16 0432  WBC 8.5 7.0  HGB 12.0* 10.5*  HCT 34.9* 30.4*  PLT 327 280  MCV 88.8 87.7  MCH 30.5 30.2  MCHC 34.3 34.5  RDW 12.9 12.9    Chemistries   Recent Labs Lab 01/04/16 1741 01/06/16 0432  NA 137 139  K 4.3 3.9  CL 102 104  CO2 30 28  GLUCOSE 170* 138*  BUN 10 11  CREATININE 0.80 0.93  CALCIUM 9.5 8.9   ------------------------------------------------------------------------------------------------------------------ estimated creatinine clearance is 63.1 mL/min (by C-G formula based on SCr of 0.93 mg/dL). ------------------------------------------------------------------------------------------------------------------  Recent Labs  01/04/16 1741  HGBA1C 7.0*   ------------------------------------------------------------------------------------------------------------------ No results for input(s): CHOL, HDL, LDLCALC, TRIG, CHOLHDL, LDLDIRECT in the last 72 hours. ------------------------------------------------------------------------------------------------------------------ No results for input(s): TSH, T4TOTAL, T3FREE, THYROIDAB in the last 72 hours.  Invalid input(s):  FREET3 ------------------------------------------------------------------------------------------------------------------ No results for input(s): VITAMINB12, FOLATE, FERRITIN, TIBC, IRON, RETICCTPCT in the last 72 hours.  Coagulation profile  Recent Labs Lab 01/04/16 1741  INR 1.05    No results for input(s): DDIMER in the last 72 hours.  Cardiac Enzymes No results for input(s): CKMB, TROPONINI, MYOGLOBIN in the last 168 hours.  Invalid input(s): CK ------------------------------------------------------------------------------------------------------------------ Invalid input(s): POCBNP    Assessment & Plan  Patient's 82 year old with right foot infection  #1 Right foot infection with gangrenous changes of the second third toe right foot Continue antibiotics with vancomycin and Zosyn Await further recommendations from vascular surgery and podiatry  #2 peripheral vascular disease Vascular surgery consult appreciated CT angio with PVD  #3 Hypertension. Continue Lopressor blood pressure stable  #4 Diabetes:  Continue sliding scale insulin Add low dose levemir     Code Status Orders        Start     Ordered   01/04/16 1646  Full code  Continuous     01/04/16 1645    Code Status History    Date Active Date Inactive Code Status Order ID Comments User Context   This patient has a current code status but no historical code status.           Consults  Vascular and podiatry   DVT Prophylaxis  heparin  Lab Results  Component Value Date   PLT 280 01/06/2016     Time Spent in minutes  35min    Auburn BilberryPATEL, Arita Severtson M.D on 01/06/2016 at 12:14 PM  Between 7am to 6pm - Pager - [phone]  After 6pm go to [email or web address] - password [email or web address] [facility]  [facility]   Office  [phone]

## 2016-01-06 NOTE — Progress Notes (Signed)
Pharmacy Antibiotic Note  Jose Schroeder is a 82 y.o. male admitted on 01/04/2016 with Wound Infection/Diabetic Foot Infection (gangrenous).  Pharmacy has been consulted for Vancomycin and Zosyn dosing.  Plan: Ke: 0.063  T1/2: 11  Vd: 50.4  Current orders are for vancomycin 750mg  every 12 hours. Trough today at 14. Will continue current orders. Goal trough 15-20.   Continue Zosyn 3.375 IV EI every 8 hours.   Will monitor Renal function daily and adjust dose as needed.   Height: 6\' 2"  (188 cm) Weight: 157 lb 14.4 oz (71.6 kg) IBW/kg (Calculated) : 82.2  Temp (24hrs), Avg:98.4 F (36.9 C), Min:98.4 F (36.9 C), Max:98.4 F (36.9 C)   Recent Labs Lab 01/04/16 1741 01/06/16 0432 01/06/16 1352  WBC 8.5 7.0  --   CREATININE 0.80 0.93  --   VANCOTROUGH  --   --  14*    Estimated Creatinine Clearance: 63.1 mL/min (by C-G formula based on SCr of 0.93 mg/dL).    No Known Allergies  Antimicrobials this admission: 9/11 vancomycin >>  9/11 Zosyn >>   Dose adjustments this admission:  Microbiology:  Thank you for allowing pharmacy to be a part of this patient's care.  Rella LarveKelly Jemmie Rhinehart, PharmD Clinical Pharmacist 01/06/2016 2:47 PM

## 2016-01-06 NOTE — Progress Notes (Signed)
[facility]  Daily Progress Note   Subjective  - * No surgery date entered *  Patient states pain in the right foot is much less.  Objective Vitals:   01/05/16 1216 01/05/16 2033 01/06/16 0953 01/06/16 1311  BP: 134/68 124/63 139/60 (!) 119/55  Pulse: 86 79 64 67  Resp: 20 18  16   Temp: 98.6 F (37 C) 98.4 F (36.9 C)  98.4 F (36.9 C)  TempSrc: Oral Oral  Oral  SpO2: 100% 97%  100%  Weight:      Height:        Intake/Output Summary (Last 24 hours) at 01/06/16 1932 Last data filed at 01/06/16 1820  Gross per 24 hour  Intake           817.29 ml  Output             1225 ml  Net          -407.71 ml    PULM  Normal effort , no use of accessory muscles CV  No JVD, RRR Abd      No distended, nontender VASC  Pedal pulses are nonpalpable bilaterally gangrene of the right toes is dry cellulitis appears improved  Laboratory CBC    Component Value Date/Time   WBC 7.0 01/06/2016 0432   HGB 10.5 (L) 01/06/2016 0432   HCT 30.4 (L) 01/06/2016 0432   HCT 32.1 (L) 02/20/2015 1438   PLT 280 01/06/2016 0432   PLT 257 02/20/2015 1438    BMET    Component Value Date/Time   NA 139 01/06/2016 0432   NA 141 09/24/2015 1426   NA 141 11/12/2012 0717   K 3.9 01/06/2016 0432   K 4.0 11/12/2012 0717   CL 104 01/06/2016 0432   CL 106 11/12/2012 0717   CO2 28 01/06/2016 0432   CO2 31 11/12/2012 0717   GLUCOSE 138 (H) 01/06/2016 0432   GLUCOSE 149 (H) 11/12/2012 0717   BUN 11 01/06/2016 0432   BUN 13 09/24/2015 1426   BUN 10 11/12/2012 0717   CREATININE 0.93 01/06/2016 0432   CREATININE 0.99 11/12/2012 0717   CALCIUM 8.9 01/06/2016 0432   CALCIUM 9.4 11/12/2012 0717   GFRNONAA >60 01/06/2016 0432   GFRNONAA >60 11/12/2012 0717   GFRAA >60 01/06/2016 0432   GFRAA >60 11/12/2012 0717    Assessment/Planning:   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 I believe that the mid distal popliteal on the right is occluded and there is significant tibial disease. Other important findings the abdominal aorta appears to be of normal caliber which is surprising but given how thin the patient is the ease by which is aorta is palpated appears to fit. More importantly CT angiography demonstrates that the left iliac arterial system is widely patent and therefore I will plan conventional access on the left to treat the right lower extremity. The risks and benefits for arteriography of been reviewed all questions answered patient has agreed to proceed    Renford DillsSchnier, Gregory G  01/06/2016, 7:32 PM

## 2016-01-07 ENCOUNTER — Encounter
Admission: AD | Disposition: A | Discharge status: Home Health | Payer: Self-pay | Source: Ambulatory Visit | Attending: Internal Medicine

## 2016-01-07 HISTORY — PX: PERIPHERAL VASCULAR CATHETERIZATION: SHX172C

## 2016-01-07 LAB — GLUCOSE, CAPILLARY
Glucose-Capillary: 85 mg/dL (ref 65–99)
Glucose-Capillary: 86 mg/dL (ref 65–99)
Glucose-Capillary: 164 mg/dL — ABNORMAL HIGH (ref 65–99)
Glucose-Capillary: 362 mg/dL — ABNORMAL HIGH (ref 65–99)

## 2016-01-07 LAB — CREATININE, SERUM
Creatinine, Ser: 0.91 mg/dL (ref 0.61–1.24)
GFR calc Af Amer: >60 mL/min (ref >60)
GFR calc non Af Amer: >60 mL/min (ref >60)

## 2016-01-07 SURGERY — ABDOMINAL AORTOGRAM W/LOWER EXTREMITY
Anesthesia: Moderate Sedation | Laterality: Right

## 2016-01-07 MED ORDER — MIDAZOLAM HCL 2 MG/2ML IJ SOLN
INTRAMUSCULAR | Status: DC | PRN
  Administered 2016-01-07 (×2): 1 mg via INTRAVENOUS
  Administered 2016-01-07: 2 mg via INTRAVENOUS
  Administered 2016-01-07: 1 mg via INTRAVENOUS

## 2016-01-07 MED ORDER — CLOPIDOGREL BISULFATE 75 MG PO TABS
75.0000 mg | ORAL_TABLET | Freq: Every day | ORAL | Status: DC
  Administered 2016-01-08: 75 mg via ORAL
  Filled 2016-01-07: qty 1

## 2016-01-07 MED ORDER — MIDAZOLAM HCL 2 MG/2ML IJ SOLN
INTRAMUSCULAR | Status: AC
  Filled 2016-01-07: qty 2

## 2016-01-07 MED ORDER — PNEUMOCOCCAL VAC POLYVALENT 25 MCG/0.5ML IJ INJ: 0.5000 mL | INJECTION | INTRAMUSCULAR | Status: DC

## 2016-01-07 MED ORDER — FENTANYL CITRATE (PF) 100 MCG/2ML IJ SOLN
INTRAMUSCULAR | Status: AC
  Filled 2016-01-07: qty 2

## 2016-01-07 MED ORDER — HEPARIN (PORCINE) IN NACL 2-0.9 UNIT/ML-% IJ SOLN
INTRAMUSCULAR | Status: AC
  Filled 2016-01-07: qty 1000

## 2016-01-07 MED ORDER — INSULIN DETEMIR 100 UNIT/ML ~~LOC~~ SOLN
8.0000 [IU] | Freq: Every day | SUBCUTANEOUS | Status: DC
  Administered 2016-01-07: 8 [IU] via SUBCUTANEOUS
  Filled 2016-01-07 (×2): qty 0.08

## 2016-01-07 MED ORDER — IOPAMIDOL (ISOVUE-300) INJECTION 61%
INTRAVENOUS | Status: DC | PRN
  Administered 2016-01-07: 75 mL via INTRA_ARTERIAL

## 2016-01-07 MED ORDER — LIDOCAINE HCL (PF) 1 % IJ SOLN
INTRAMUSCULAR | Status: AC
  Filled 2016-01-07: qty 30

## 2016-01-07 MED ORDER — CLOPIDOGREL BISULFATE 75 MG PO TABS
ORAL_TABLET | ORAL | Status: AC
  Administered 2016-01-07: 300 mg via ORAL
  Filled 2016-01-07: qty 4

## 2016-01-07 MED ORDER — HEPARIN SODIUM (PORCINE) 1000 UNIT/ML IJ SOLN
INTRAMUSCULAR | Status: DC | PRN
  Administered 2016-01-07: 5000 [IU] via INTRAVENOUS

## 2016-01-07 MED ORDER — FENTANYL CITRATE (PF) 100 MCG/2ML IJ SOLN
INTRAMUSCULAR | Status: DC | PRN
  Administered 2016-01-07 (×3): 50 ug via INTRAVENOUS

## 2016-01-07 MED ORDER — CLOPIDOGREL BISULFATE 75 MG PO TABS
300.0000 mg | ORAL_TABLET | Freq: Once | ORAL | Status: AC
  Administered 2016-01-07: 300 mg via ORAL

## 2016-01-07 MED ORDER — MIDAZOLAM HCL 2 MG/2ML IJ SOLN
INTRAMUSCULAR | Status: AC
  Filled 2016-01-07: qty 4

## 2016-01-07 MED ORDER — HEPARIN SODIUM (PORCINE) 1000 UNIT/ML IJ SOLN
INTRAMUSCULAR | Status: AC
  Filled 2016-01-07: qty 1

## 2016-01-07 SURGICAL SUPPLY — 21 items
BALLN ARMADA 3.0X60X150 (BALLOONS) ×2
BALLN ARMADA 3X60X150 (BALLOONS) ×1
BALLN LUTONIX DCB 5X60X130 (BALLOONS) ×2
BALLN ULTRVRSE 018 2.5X100X150 (BALLOONS) ×2
BALLN ULTRVRSE 2X220X150 (BALLOONS) ×2
BALLOON ARMADA 3X60X150 (BALLOONS) IMPLANT
BALLOON LUTONIX DCB 5X60X130 (BALLOONS) ×1 IMPLANT
BALLOON ULTRVRSE 2X220X150 (BALLOONS) IMPLANT
BALLOON ULTRVS 018 2.5X100X150 (BALLOONS) IMPLANT
CATH GWIRE MARINER STRGHT 4FR (CATHETERS) ×1 IMPLANT
CATH PIG 70CM (CATHETERS) ×1 IMPLANT
DEVICE PRESTO INFLATION (MISCELLANEOUS) ×1 IMPLANT
DEVICE STARCLOSE SE CLOSURE (Vascular Products) ×1 IMPLANT
DEVICE TORQUE (MISCELLANEOUS) ×1 IMPLANT
GLIDEWIRE ANGLED SS 035X260CM (WIRE) ×1 IMPLANT
PACK ANGIOGRAPHY (CUSTOM PROCEDURE TRAY) ×1 IMPLANT
SET INTRO CAPELLA COAXIAL (SET/KITS/TRAYS/PACK) ×2 IMPLANT
SHEATH BRITE TIP 5FRX11 (SHEATH) ×1 IMPLANT
SHEATH RAABE 6FR (SHEATH) ×1 IMPLANT
WIRE G V18X300CM (WIRE) ×1 IMPLANT
WIRE J 3MM .035X145CM (WIRE) ×1 IMPLANT

## 2016-01-07 NOTE — Progress Notes (Signed)
PT Cancellation Note  Patient Details Name: Jose Schroeder MRN: [id number] DOB: 1932-10-26   Cancelled Treatment:    Reason Eval/Treat Not Completed: Patient at procedure or test/unavailable (Consult received.  Patient currently off unit for procedure; will re-attempt at later time/date as patient available and medically appropriate.)   [name], PT, DPT, NCS 01/07/16, 12:24 PM [phone]

## 2016-01-07 NOTE — Plan of Care (Signed)
Problem: Skin Integrity: Goal: Risk for impaired skin integrity will decrease Outcome: Not Progressing Patient has gangrene in right foot.  Problem: Tissue Perfusion: Goal: Risk factors for ineffective tissue perfusion will decrease Outcome: Not Progressing Patient has gangrene in right foot.

## 2016-01-07 NOTE — Care Management (Signed)
PT eval pending.

## 2016-01-07 NOTE — OR Nursing (Signed)
Dr schnier and Dr Imogene Burnhen notified of ectopy and hypertension

## 2016-01-07 NOTE — OR Nursing (Signed)
Dr schnier reviewed ekg and informed of bp

## 2016-01-07 NOTE — Op Note (Signed)
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 Introduction catheter into right lower extremity 3rd order catheter placement  2. Contrast injection right lower extremity for distal runoff  3. Percutaneous transluminal angioplasty right popliteal artery to 5 mm with a Lutonix 4. Percutaneous transluminal angioplasty right posterior tibial to 3 mm  5. Star close closure left common femoral arteriotomy  Anesthesia: Conscious sedation was administered under my direct supervision. IV Versed plus fentanyl were utilized. Continuous ECG, pulse oximetry and blood pressure was monitored throughout the entire procedure.  Conscious sedation was for a total of 1 hour 17 minutes.  Sheath: 6 French Rabi left common femoral  Contrast: 78 cc  Fluoroscopy Time: 7.7 minutes  Indications: Jose Schroeder presents with increasing pain of the right lower extremity. He has developed gangrene of several toes of the right foot. This suggests the patient is having limb threatening ischemia. The risks and benefits are reviewed all questions answered patient agrees to proceed.  Procedure:Jose Schroeder is a 82 y.o. y.o. male who was identified and appropriate procedural time out was performed. The patient was then placed supine on the table and prepped and draped in the usual sterile fashion.   Ultrasound was placed in the sterile sleeve and the left groin was evaluated the left common femoral artery was echolucent and pulsatile indicating patency. Image was recorded for the permanent record and under real-time visualization a microneedle was  inserted into the common femoral artery microwire followed by a micro-sheath. A J-wire was then advanced through the micro-sheath and a 5 [place] sheath was then inserted over a J-wire. J-wire was then advanced and a 5 French pigtail catheter was positioned at the level of T12.  AP projection of the aorta was then obtained. Pigtail catheter was repositioned to above the bifurcation and a view of the pelvis was obtained. Subsequently a pigtail catheter with the stiff angle Glidewire was used to cross the aortic bifurcation the catheter wire were advanced down into the LAO distal external iliac artery. Oblique view of the femoral bifurcation was then obtained and subsequently the wire was reintroduced and the pigtail catheter negotiated into the SFA representing third order catheter placement. Distal runoff was then performed.  5000 units of heparin was then given and allowed to circulate and a 6 [place] Rabi sheath was advanced up and over the bifurcation and positioned in the femoral artery  Straight catheter and stiff angle Glidewire were then negotiated down into the distal popliteal. Catheter was then advanced. Hand injection contrast demonstrated the tibial anatomy in detail.  5 x 6 Lutonix balloon was used to angioplasty the mid. The inflation was for 1 minutes at 12 atm. Follow-up imaging demonstrated excellent patency with preservation of the distal runoff.  The detector was then repositioned and the posterior tibial artery was reimaged several stenosis are noted in this area and after appropriate measurements are made a 3 x 60 all traverse balloon was used to angioplasty the proximal posterior tibial artery. Inflation were to 12 atmospheres for1 minute. Subsequently more distally down to the level of the lateral plantar 8 2 x 22 ultra versed balloon was inflated to 14 atm for 2 minutes subsequently a 2.5 x 10 ultra versed balloon was inflated for approximately 2 minutes at 12 atm. Follow-up imaging  demonstrated patency with excellent result. Distal runoff was then reassessed and noted to be widely patent.   After review of these images the sheath is pulled into the left external iliac oblique of the common femoral is obtained and a Star close device deployed. There no immediate Complications.  Findings: The abdominal aorta is opacified with a bolus injection contrast. Renal arteries are widely patent. The aorta itself has diffuse disease but no hemodynamically significant lesions. The common and external iliac arteries are widely patent bilaterally.  The right common femoral is widely patent as is the profunda femoris.  The SFA is widely patent while the popliteal does indeed have a significant stenosis 70% mid stenosis.  The distal popliteal demonstrates wide patency and the trifurcation is heavily diseased with occlusion of the anterior tibial and peroneal arteries. The posterior tibial demonstrates a subtotal occlusion in its proximal portion and a string sign distally with occlusion at the level of the malleolus.   Following angioplasty posterior tibial  tibial now has in-line flow and looks quite nice. Angioplasty of the popliteal yields an excellent result with less than 10% residual stenosis.  Summary: Successful recanalization right lower cavity for limb salvage   Disposition: Patient was taken to the recovery room in stable condition having tolerated the procedure well.  Schnier, Dolores Lory 01/07/2016,1:50 PM

## 2016-01-07 NOTE — Progress Notes (Signed)
[facility] - [place] at [facility]                                                                                                                                                                                            Patient Demographics   Jose Schroeder, is a 82 y.o. male, DOB - 11-09-1932, ZOX:[id number]  Admit date - 01/04/2016   Admitting Physician Shaune Pollack, MD  Outpatient Primary MD for the patient is Elizabeth Sauer, MD   LOS - 3  Subjective: Complains of pain in the right foot.   Review of Systems:   CONSTITUTIONAL: No documented fever. No fatigue, weakness. No weight gain, no weight loss.  EYES: No blurry or double vision.  ENT: No tinnitus. No postnasal drip. No redness of the oropharynx.  RESPIRATORY: No cough, no wheeze, no hemoptysis. No dyspnea.  CARDIOVASCULAR: No chest pain. No orthopnea. No palpitations. No syncope.  GASTROINTESTINAL: No nausea, no vomiting or diarrhea. No abdominal pain. No melena or hematochezia.  GENITOURINARY: No dysuria or hematuria.  ENDOCRINE: No polyuria or nocturia. No heat or cold intolerance.  HEMATOLOGY: No anemia. No bruising. No bleeding.  INTEGUMENTARY: Redness and swelling of the right toe MUSCULOSKELETAL: No arthritis. No swelling. No gout. Right foot pain NEUROLOGIC: No numbness, tingling, or ataxia. No seizure-type activity.  PSYCHIATRIC: No anxiety. No insomnia. No ADD.    Vitals:   Vitals:   01/07/16 1405 01/07/16 1410 01/07/16 1415 01/07/16 1420  BP:   105/62   Pulse: (!) 56 (!) 55 64 (!) 59  Resp: 10 (!) 0 11 14  Temp:      TempSrc:      SpO2: 99% 97% 99% 100%  Weight:      Height:        Wt Readings from Last 3 Encounters:  01/04/16 157 lb 14.4 oz (71.6 kg)  09/24/15 163 lb (73.9 kg)  03/04/15 161 lb (73 kg)     Intake/Output Summary (Last 24 hours) at 01/07/16 1430 Last data filed at 01/07/16 1058  Gross per 24 hour  Intake             1631 ml  Output             1300 ml   Net              331 ml    Physical Exam:   GENERAL: Pleasant-appearing in no apparent distress.  HEAD, EYES, EARS, NOSE AND THROAT: Atraumatic, normocephalic. Extraocular muscles are intact. Pupils equal and reactive to light. Sclerae anicteric. No conjunctival injection. No oro-pharyngeal erythema.  NECK: Supple. There is  no jugular venous distention. No bruits, no lymphadenopathy, no thyromegaly.  HEART: Regular rate and rhythm,. No murmurs, no rubs, no clicks.  LUNGS: Clear to auscultation bilaterally. No rales or rhonchi. No wheezes.  ABDOMEN: Soft, flat, nontender, nondistended. Has good bowel sounds. No hepatosplenomegaly appreciated.  EXTREMITIES: No evidence of any cyanosis, clubbing, or peripheral edema.  Diminished pulses on the right foot  NEUROLOGIC: The patient is alert, awake, and oriented x3 with no focal motor or sensory deficits appreciated bilaterally.  SKIN: Dressing in place on the right foot  Psych: Not anxious, depressed LN: No inguinal LN enlargement    Antibiotics   Anti-infectives    Start     Dose/Rate Route Frequency Ordered Stop   01/07/16 0600  ceFAZolin (ANCEF) IVPB 1 g/50 mL premix  Status:  Discontinued     1 g 100 mL/hr over 30 Minutes Intravenous On call to O.R. 01/06/16 1930 01/07/16 1315   01/05/16 0200  [MAR Hold]  vancomycin (VANCOCIN) IVPB 750 mg/150 ml premix     (MAR Hold since 01/07/16 1155)   750 mg 150 mL/hr over 60 Minutes Intravenous Every 12 hours 01/04/16 2005     01/04/16 2000  [MAR Hold]  piperacillin-tazobactam (ZOSYN) IVPB 3.375 g     (MAR Hold since 01/07/16 1155)   3.375 g 12.5 mL/hr over 240 Minutes Intravenous Every 8 hours 01/04/16 1905     01/04/16 1915  vancomycin (VANCOCIN) IVPB 1000 mg/200 mL premix     1,000 mg 200 mL/hr over 60 Minutes Intravenous  Once 01/04/16 1906 01/04/16 2114      Medications   Scheduled Meds: . [MAR Hold] enoxaparin (LOVENOX) injection  40 mg Subcutaneous Q24H  . [MAR Hold] ferrous  sulfate  325 mg Oral Q breakfast  . [MAR Hold] insulin aspart  0-5 Units Subcutaneous QHS  . [MAR Hold] insulin aspart  0-9 Units Subcutaneous TID WC  . [MAR Hold] insulin detemir  8 Units Subcutaneous QHS  . [MAR Hold] lisinopril  2.5 mg Oral Daily  . [MAR Hold] metoprolol  50 mg Oral BID  . [MAR Hold] piperacillin-tazobactam (ZOSYN)  IV  3.375 g Intravenous Q8H  . [MAR Hold] sodium chloride flush  3 mL Intravenous Q12H  . [MAR Hold] vancomycin  750 mg Intravenous Q12H   Continuous Infusions: . sodium chloride 75 mL/hr at 01/07/16 0003   PRN Meds:.[MAR Hold] sodium chloride, [MAR Hold] acetaminophen **OR** [MAR Hold] acetaminophen, [MAR Hold] albuterol, [MAR Hold] bisacodyl, [MAR Hold] HYDROcodone-acetaminophen, [MAR Hold] ketorolac, [MAR Hold] ondansetron **OR** [MAR Hold] ondansetron (ZOFRAN) IV, [MAR Hold] senna-docusate, [MAR Hold] sodium chloride flush   Data Review:   Micro Results No results found for this or any previous visit (from the past 240 hour(s)).  Radiology Reports Ct Angio Ao+bifem W &/or Wo Contrast  Result Date: 01/05/2016 CLINICAL DATA:  82 year old male with a history of cellulitis and right foot gangrene. No palpable pulses. EXAM: CT ANGIOGRAPHY OF ABDOMINAL AORTA WITH ILIOFEMORAL RUNOFF TECHNIQUE: Multidetector CT imaging of the abdomen, pelvis and lower extremities was performed using the standard protocol during bolus administration of intravenous contrast. Multiplanar CT image reconstructions and MIPs were obtained to evaluate the vascular anatomy. CONTRAST:  100 cc IV contrast COMPARISON:  No CT comparison available. No noninvasive study available. Angiogram of the left lower extremity 11/12/2012 FINDINGS: VASCULAR Aorta: Mixed soft plaque and calcified plaque of the abdominal aorta. No dissection, aneurysm, or periaortic fluid. Celiac: Calcifications at the origin of the celiac artery with less than 50%  stenosis. Celiac artery maintains patency. Typical branching  pattern of the celiac artery. SMA: SMA remains patent. Calcifications at the origin with no significant stenosis. Distal SMA branches remain patent. Renals: Bilateral renal arteries with atherosclerotic changes on the right. No significant stenosis. IMA: Inferior mesenteric artery remains patent with atherosclerotic plaque at the origin. RIGHT Lower Extremity Right common iliac artery with calcifications at the origin. No dissection, aneurysm, or occlusion. Hypogastric artery remains patent. External iliac artery is patent. Surgical changes of the right inguinal region. Runoff: Profunda femoris and circumflex femoral artery remain patent. Mild atherosclerotic disease of the right superficial femoral artery, with calcified plaque at the canal. Popliteal artery demonstrates significant plaque in the below-knee segment, with likely 50% stenosis at the joint space secondary to calcified plaque. Origin of the anterior tibial artery patent. Tibioperoneal trunk appears patent. Beyond the proximal third there is occlusion of the anterior tibial artery. Posterior tibial artery is partially opacified, although calcium limits evaluation in the distal leg. Peroneal artery appears occluded above the ankle. LEFT Lower Extremity Left common iliac artery demonstrates atherosclerotic plaque with no significant stenosis, occlusion, aneurysm, dissection flap. Hypogastric artery remains patent. External iliac artery is patent as well as common femoral artery. Minimal plaque at the common femoral artery. Profunda femoris and circumflex femoral artery patent. Runoff: Mixed soft plaque and calcified plaque of the left superficial femoral artery, although the artery is patent to the popliteal artery. Mixed calcified and soft plaque of the popliteal artery, which is patent to the trifurcation. Anterior tibial artery is occluded just after the origin. Tibioperoneal trunk is patent. The proximal posterior tibial artery and peroneal artery  patent, although there is occlusion of the posterior tibial artery above the ankle. The peroneal artery appears to be patent to the ankle. Veins: Unremarkable Review of the MIP images confirms the above findings. NON-VASCULAR Lower chest: Heart size normal limits. No pericardial fluid/ thickening. Coronary calcifications are evident. Small hiatal hernia. No confluent airspace disease. No pleural effusion. Chronic lung changes with scarring/atelectasis. Hepatobiliary: Nodular contour of the liver, raising the concern for cirrhosis. Unremarkable appearance of gallbladder. Pancreas: Vascular calcifications versus chronic coarsened calcifications of pancreatic parenchyma. Spleen: Unremarkable spleen Adrenals/Urinary Tract: Unremarkable adrenal glands. Unremarkable appearance of the bilateral kidneys. Unremarkable course the bilateral ureters. Unremarkable appearance of the urinary bladder, partially distended. Stomach/Bowel: Unremarkable appearance of stomach with small hiatal hernia. Duodenum diverticulum with otherwise unremarkable small bowel. Colonic diverticula. No current inflammatory changes evident. Normal appendix. Lymphatic: No lymphadenopathy. Reproductive: Unremarkable appearance of prostate. Fact containing right inguinal hernia. Other: None Musculoskeletal: No asymmetric swelling of the lower extremities. No displaced fracture identified. Degenerative changes at the bilateral hips and the bilateral knee joints. Minimal degenerative changes. Fat lesion within the canal at the L1-L2 level with cranial caudal span of 3.3 cm. IMPRESSION: VASCULAR Atherosclerotic changes, with vascular disease involving the aorta, iliac vessels, bilateral femoral-popliteal arteries and tibial vessels. The preponderance of disease is within the tibial vasculature bilaterally, with apparent single vessel runoff on the left via the peroneal artery, and likely discontinuous flow to the ankle on the right. Establishing location of  occlusion/stenoses of the bilateral lower legs difficult given the degree of calcium. Native coronary artery disease. NON-VASCULAR **An incidental finding of potential clinical significance has been found. Fat attenuation lesion within the canal at the L1-L2 level, which may be intradural or extradural, potentially dermoid or epidermoid tumor. Further evaluation with lumbar MRI is indicated.** Nodular contour of the liver, favored to represent early cirrhosis.  Correlation with patient history and presentation may be useful. Consider gastroenterology referral for potential initiation of HCC surveillance. Coarsened calcifications within the pancreas. At least some of these are vascular, however, given the appearance of the liver, there is likely an element of chronic pancreatitis. Correlation with history of pancreatitis may be also useful. Diverticular disease with no acute findings. These results will be called to the ordering clinician or representative by the Radiologist Assistant, and communication documented in the PACS or zVision Dashboard. Signed, Yvone Neu. Loreta Ave, DO [facility] Radiology Electronically Signed   By: Gilmer Mor D.O.   On: 01/05/2016 12:29     CBC  Recent Labs Lab 01/04/16 1741 01/06/16 0432  WBC 8.5 7.0  HGB 12.0* 10.5*  HCT 34.9* 30.4*  PLT 327 280  MCV 88.8 87.7  MCH 30.5 30.2  MCHC 34.3 34.5  RDW 12.9 12.9    Chemistries   Recent Labs Lab 01/04/16 1741 01/06/16 0432 01/07/16 0430  NA 137 139  --   K 4.3 3.9  --   CL 102 104  --   CO2 30 28  --   GLUCOSE 170* 138*  --   BUN 10 11  --   CREATININE 0.80 0.93 0.91  CALCIUM 9.5 8.9  --    ------------------------------------------------------------------------------------------------------------------ estimated creatinine clearance is 64.5 mL/min (by C-G formula based on SCr of 0.91  mg/dL). ------------------------------------------------------------------------------------------------------------------  Recent Labs  01/04/16 1741  HGBA1C 7.0*   ------------------------------------------------------------------------------------------------------------------ No results for input(s): CHOL, HDL, LDLCALC, TRIG, CHOLHDL, LDLDIRECT in the last 72 hours. ------------------------------------------------------------------------------------------------------------------ No results for input(s): TSH, T4TOTAL, T3FREE, THYROIDAB in the last 72 hours.  Invalid input(s): FREET3 ------------------------------------------------------------------------------------------------------------------ No results for input(s): VITAMINB12, FOLATE, FERRITIN, TIBC, IRON, RETICCTPCT in the last 72 hours.  Coagulation profile  Recent Labs Lab 01/04/16 1741  INR 1.05    No results for input(s): DDIMER in the last 72 hours.  Cardiac Enzymes No results for input(s): CKMB, TROPONINI, MYOGLOBIN in the last 168 hours.  Invalid input(s): CK ------------------------------------------------------------------------------------------------------------------ Invalid input(s): POCBNP    Assessment & Plan  Patient's 82 year old with right foot infection  #1 Right foot infection with gangrenous changes of the second third toe right foot Continue antibiotics with vancomycin and Zosyn.   #2 peripheral vascular disease Angioplasty today per Dr. Gilda Crease.  #3 Hypertension. Controlled with Lopressor and lisinopril.  #4 Diabetes:  Continue sliding scale insulin Added low dose levemir     Code Status Orders        Start     Ordered   01/04/16 1646  Full code  Continuous     01/04/16 1645    Code Status History    Date Active Date Inactive Code Status Order ID Comments User Context   This patient has a current code status but no historical code status.           Consults   Vascular and podiatry   DVT Prophylaxis  heparin  Lab Results  Component Value Date   PLT 280 01/06/2016     Time Spent in minutes  33 min    Shaune Pollack M.D on 01/07/2016 at 2:30 PM  Between 7am to 6pm - Pager - [phone]  After 6pm go to [email or web address] - password [email or web address] [facility]  [facility]   Office  [phone]

## 2016-01-07 NOTE — Progress Notes (Signed)
Pt off floor for vascular procedure Will f/u tomorrow.

## 2016-01-08 ENCOUNTER — Encounter: Payer: Self-pay | Admitting: Vascular Surgery

## 2016-01-08 LAB — CREATININE, SERUM
Creatinine, Ser: 0.87 mg/dL (ref 0.61–1.24)
GFR calc Af Amer: >60 mL/min (ref >60)
GFR calc non Af Amer: >60 mL/min (ref >60)

## 2016-01-08 LAB — GLUCOSE, CAPILLARY
Glucose-Capillary: 76 mg/dL (ref 65–99)
Glucose-Capillary: 243 mg/dL — ABNORMAL HIGH (ref 65–99)

## 2016-01-08 MED ORDER — OXYCODONE-ACETAMINOPHEN 5-325 MG PO TABS: 1.0000 | ORAL_TABLET | Freq: Three times a day (TID) | ORAL | 0 refills | Status: DC | PRN

## 2016-01-08 MED ORDER — ASPIRIN EC 81 MG PO TBEC: 81.0000 mg | DELAYED_RELEASE_TABLET | Freq: Every day | ORAL | 2 refills | Status: DC

## 2016-01-08 MED ORDER — CLOPIDOGREL BISULFATE 75 MG PO TABS: 75.0000 mg | ORAL_TABLET | Freq: Every day | ORAL | 2 refills | Status: DC

## 2016-01-08 MED ORDER — CEPHALEXIN 500 MG PO CAPS: 500.0000 mg | ORAL_CAPSULE | Freq: Three times a day (TID) | ORAL | 0 refills | Status: DC

## 2016-01-08 NOTE — Progress Notes (Signed)
F/u right foot necrosis. Pt underwent angioplasy yesterday.  Necrotic tissue is noted to distal 2nd toe and most of 3rd toe.  Irritation to dorsal foot is decreasing. No purulence.  Dressing changed today. Will allow further demarcation and plan outpt amputation of partial 2nd and 3rd toes next week.  OK for d/c home on po keflex from podiatry standpoint. Weight bearing as tolerated to heel in post op shoe. I will see pt in [place] on Monday to perform dressing change.

## 2016-01-08 NOTE — Evaluation (Signed)
Physical Therapy Evaluation Patient Details Name: Jose RohrerKelep R Schroeder MRN: [id number] DOB: 1933/02/27 Today's Date: 01/08/2016   History of Present Illness  Pt. directly admitted from dr. Irene Limbofowler's office regarding infection and necrosis in the R foot 2nd and 3rd digits, pt. current WB status on RLE through heel only with post op shoe donned. Per. conversation with Dr. Ether GriffinsFowler 01/08/16 pt. may convert to NWB in RLE if additional surgical intervention is necessary.   Clinical Impression  Pt. Supine, awake, alert, and oriented upon arrival. Pt. Demonstrates gross B UE and B LE strength 5/5. He was able to transfer from supine to sitting EOB with mod I, and demonstrated good sitting balance at EOB. Pt. Able to transfer from bed to standing with RW Mod I with UE push from stable surface. Pt. Verbalized and demonstrated understanding of weight bearing status throughout session. Pt. Performed approx. 1130ft. Of ambulation RW mod I, while maintaining heel weight bearing only on the R LE with post-op shoe donned and standard shoe on LLE. Demonstrated step to pattern and dependence on RW for B UE support. Pt. Was also instructed and demonstrated understanding of NWB status, he took approx. 6 hops with mod I and reliance on RW for B UE support. He demonstrated good sitting and standing balance with functional reaching tasks when performing bathroom and hand hygiene. Would benefit from skilled PT to address above deficits and promote optimal return to PLOF. Recommend home health PT upon d/c to follow up with further PT needs.     Follow Up Recommendations Home health PT    Equipment Recommendations  Rolling walker with 5" wheels    Recommendations for Other Services       Precautions / Restrictions Precautions Precautions: Fall Required Braces or Orthoses: Other Brace/Splint Other Brace/Splint: post-op shoe on R LE  Restrictions Weight Bearing Restrictions: Yes RLE Weight Bearing: Partial weight  bearing RLE Partial Weight Bearing Percentage or Pounds: through R LE heel only with post-op shoe donned      Mobility  Bed Mobility Overal bed mobility: Modified Independent             General bed mobility comments: HOB elevated, use of bedrail   Transfers Overall transfer level: Needs assistance   Transfers: Sit to/from Stand Sit to Stand: Supervision         General transfer comment: verbal cues to push up from seating surface  Ambulation/Gait Ambulation/Gait assistance: Supervision Ambulation Distance (Feet): 40 Feet Assistive device: Rolling walker (2 wheeled)       General Gait Details: Pt. requires B UE from RW to maintain heel weight bearing in R LE, able to maintain weight bearing restrictions and perform step to pattern with RW, slow cadence approx. 30 feet while negotiating obstacles in room and bathroom. Pt. also demosntrated ability to perform approx. 6 steps with NWB on RLE and reliance of B UE support from RW.   Stairs            Wheelchair Mobility    Modified Rankin (Stroke Patients Only)       Balance Overall balance assessment: Needs assistance Sitting-balance support: No upper extremity supported;Feet supported Sitting balance-Leahy Scale: Good     Standing balance support: Bilateral upper extremity supported Standing balance-Leahy Scale: Good                               Pertinent Vitals/Pain Pain Assessment: No/denies pain    Home Living  Living Arrangements: Spouse/significant other Available Help at Discharge: Family Type of Home: House Home Access: Level entry     Home Layout: One level        Prior Function Level of Independence: Independent;Independent with assistive device(s)         Comments: Pt. states he uses the RW "when it's close by"      Hand Dominance        Extremity/Trunk Assessment   Upper Extremity Assessment: Overall WFL for tasks assessed (B UE grossly 5/5 symmetrical  )           Lower Extremity Assessment: Overall WFL for tasks assessed (B LE grossly 5/5 symmetrical )         Communication   Communication: No difficulties  Cognition Arousal/Alertness: Awake/alert Behavior During Therapy: WFL for tasks assessed/performed Overall Cognitive Status: Within Functional Limits for tasks assessed                      General Comments      Exercises Other Exercises Other Exercises: Pt. requested use of restroom during session. Pt. demonstrated ability to negotiate obstables with RW while maintaining R heel weight bearing status without LOB. Also demonstated abilty to perform sit<>stand transfer from low surface mod I up to RW, Demonstrated functional reaching in sitting and standing tasks of approx. 45ft  while performing bathroom and hand hygeine.    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 would like to return home PT Goal Formulation: With patient Time For Goal Achievement: 01/22/16 Potential to Achieve Goals: Good    Frequency Min 2X/week   Barriers to discharge        Co-evaluation               End of Session Equipment Utilized During Treatment: Gait belt (R post op heel shoe) Activity Tolerance: Patient tolerated treatment well Patient left: in chair;with chair alarm set;with call bell/phone within reach Nurse Communication: Weight bearing status;Mobility  status         Time: 1610-9604 PT Time Calculation (min) (ACUTE ONLY): 31 min   Charges:   PT Evaluation $PT Eval Low Complexity: 1 Procedure PT Treatments $Therapeutic Activity: 8-22 mins   PT G Codes:         Jose Schroeder, SPT  01/08/16,1:51 PM

## 2016-01-08 NOTE — Discharge Summary (Signed)
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 Diabetes mellitus without complication (HCC)   . Hypertension    HOSPITAL COURSE:  Patient's 82 year old with right foot infection  #1 Right foot infection with gangrenous changes of the second third toe right foot He has been treated with antibiotics with vancomycin and Zosyn.  Per Dr. Ether GriffinsFowler, Necrotic tissue is noted to distal 2nd toe and most of 3rd toe.  Irritation to dorsal foot is decreasing. No purulence. Dressing changed today, and discharged home with Keflex.  #2 peripheral vascular disease Angioplasty done by Dr. Gilda CreaseSchnier. Resume Plavix and aspirin.  #3 Hypertension. Controlled with Lopressor and lisinopril.  #4 Diabetes:  He has been treated with sliding scale insulin Added low dose levemir. Resume home by mouth diabetes medications after discharge.  DISCHARGE CONDITIONS:  Stable, discharge to home with home health and PT today. CONSULTS OBTAINED:  Treatment Team:  Gwyneth RevelsJustin Fowler, DPM Recardo EvangelistMatthew Troxler, DPM Renford DillsGregory G Schnier, MD DRUG ALLERGIES:  No Known Allergies DISCHARGE MEDICATIONS:     Medication List    STOP taking these medications   acetaminophen 500 MG tablet Commonly known as:  TYLENOL     TAKE these medications   aspirin EC 81 MG tablet Take 1 tablet (81 mg total) by mouth daily.   cephALEXin 500 MG capsule Commonly known as:  KEFLEX Take 1 capsule (500 mg total) by mouth 3 (three)  times daily.   clopidogrel 75 MG tablet Commonly known as:  PLAVIX Take 1 tablet (75 mg total) by mouth daily. Start taking on:  01/09/2016   ferrous sulfate 325 (65 FE) MG tablet Take 1 tablet (325 mg total) by mouth daily with breakfast.   glipiZIDE 10 MG 24 hr tablet Commonly known as:  GLUCOTROL XL Take 1 tablet (10 mg total) by mouth daily with breakfast.   lisinopril 2.5 MG tablet Commonly known as:  PRINIVIL,ZESTRIL TAKE ONE (1) TABLET BY MOUTH ONCE DAILY   metFORMIN 500 MG tablet Commonly known as:  GLUCOPHAGE Take 1 tablet (500 mg total) by mouth 2 (two) times daily.   metoprolol 50 MG tablet Commonly known as:  LOPRESSOR Take 1 tablet (50 mg total) by mouth 2 (two) times daily.   oxyCODONE-acetaminophen 5-325 MG tablet Commonly known as:  ROXICET Take 1 tablet by mouth every 8 (eight) hours as needed for moderate pain or severe pain.   pioglitazone 15 MG tablet Commonly known as:  ACTOS TAKE ONE (1) TABLET BY MOUTH ONCE DAILY        DISCHARGE INSTRUCTIONS:   DIET:  Heart healthy and ADA diet. DISCHARGE CONDITION:  Stable ACTIVITY:  Weight bearing as tolerated to heel in post op shoe. DISCHARGE LOCATION:    If you experience worsening of your admission symptoms, develop shortness of breath, life threatening emergency, suicidal or homicidal thoughts you must seek medical attention immediately by calling 911 or calling your MD immediately  if symptoms less severe.  You Must read complete instructions/literature along with  all the possible adverse reactions/side effects for all the Medicines you take and that have been prescribed to you. Take any new Medicines after you have completely understood and accpet all the possible adverse reactions/side effects.   Please note  You were cared for by a hospitalist during your hospital stay. If you have any questions about your discharge medications or the care you received while you were in the hospital after you  are discharged, you can call the unit and asked to speak with the hospitalist on call if the hospitalist that took care of you is not available. Once you are discharged, your primary care physician will handle any further medical issues. Please note that NO REFILLS for any discharge medications will be authorized once you are discharged, as it is imperative that you return to your primary care physician (or establish a relationship with a primary care physician if you do not have one) for your aftercare needs so that they can reassess your need for medications and monitor your lab values.    On the day of Discharge:  VITAL SIGNS:  Blood pressure 129/78, pulse 79, temperature 98.3 F (36.8 C), resp. rate 18, height 6\' 2"  (1.88 m), weight 157 lb 14.4 oz (71.6 kg), SpO2 100 %. PHYSICAL EXAMINATION:  GENERAL:  82 y.o.-year-old patient lying in the bed with no acute distress.  EYES: Pupils equal, round, reactive to light and accommodation. No scleral icterus. Extraocular muscles intact.  HEENT: Head atraumatic, normocephalic. Oropharynx and nasopharynx clear.  NECK:  Supple, no jugular venous distention. No thyroid enlargement, no tenderness.  LUNGS: Normal breath sounds bilaterally, no wheezing, rales,rhonchi or crepitation. No use of accessory muscles of respiration.  CARDIOVASCULAR: S1, S2 normal. No murmurs, rubs, or gallops.  ABDOMEN: Soft, non-tender, non-distended. Bowel sounds present. No organomegaly or mass.  EXTREMITIES: No pedal edema, cyanosis, or clubbing. Right foot in dressing. NEUROLOGIC: Cranial nerves II through XII are intact. Muscle strength 5/5 in all extremities. Sensation intact. Gait not checked.  PSYCHIATRIC: The patient is alert and oriented x 3.  SKIN: No obvious rash, lesion, or ulcer.  DATA REVIEW:   CBC  Recent Labs Lab 01/06/16 0432  WBC 7.0  HGB 10.5*  HCT 30.4*  PLT 280    Chemistries   Recent Labs Lab 01/06/16 0432  01/08/16 0426  NA 139  --   --     K 3.9  --   --   CL 104  --   --   CO2 28  --   --   GLUCOSE 138*  --   --   BUN 11  --   --   CREATININE 0.93  < > 0.87  CALCIUM 8.9  --   --   < > = values in this interval not displayed.   Microbiology Results  No results found for this or any previous visit.  RADIOLOGY:  No results found.   Management plans discussed with the patient, His wife and Dr. Ether Griffins and they are in agreement.  CODE STATUS:     Code Status Orders        Start     Ordered   01/04/16 1646  Full code  Continuous     01/04/16 1645    Code Status History    Date Active Date Inactive Code Status Order ID Comments User Context   This patient has a current code status but no historical code status.      TOTAL TIME TAKING CARE OF  THIS PATIENT:36 minutes.    Shaune Pollack M.D on 01/08/2016 at 4:41 PM  Between 7am to 6pm - Pager - [phone]  After 6pm go to [email or web address] - Social research officer, government  [facility]  Office  [phone]  CC: Primary care physician; Elizabeth Sauer, MD   Note: This dictation was prepared with Dragon dictation along with smaller phrase technology. Any transcriptional errors that result from this process are unintentional.

## 2016-01-08 NOTE — Discharge Instructions (Signed)
Heart healthy and ADA diet.  Activity as tolerated. Wound care. Weight bearing as tolerated to heel in post op shoe.

## 2016-01-08 NOTE — Progress Notes (Signed)
Patient discharge teaching given, including activity, diet, follow-up appoints, and medications. Patient verbalized understanding of all discharge instructions. IV access was d/c'd. Home Health was set up.  Vitals are stable. Skin is intact except as charted in most recent assessments. Pt to be escorted out by NT, to be driven home by daughter. Pt was sent home with walker that was provided.   Karsten RoLauren E Hobbs

## 2016-01-08 NOTE — Care Management (Signed)
Patient to discharge today with home health services.  RW has been delivered to room.  Patient offered agency preference list. I contacted [facility] per their request.  [facility], [facility], [facility], [facility], and [facility] could all not accept the patient due to staffing/insurace/ or location of the patient.  [facility] the patient, however the start of care will be Wednesday or Thursday.  Dr Imogene Burnhen and patient notified. Tim with [facility] notified of referral.  Patient has follow up appointment with podiatry on Monday.  RNCM signing off.

## 2016-01-11 DIAGNOSIS — L97509 Non-pressure chronic ulcer of other part of unspecified foot with unspecified severity: Secondary | ICD-10-CM | POA: Diagnosis not present

## 2016-01-11 DIAGNOSIS — S98119A Complete traumatic amputation of unspecified great toe, initial encounter: Secondary | ICD-10-CM | POA: Insufficient documentation

## 2016-01-11 DIAGNOSIS — I739 Peripheral vascular disease, unspecified: Secondary | ICD-10-CM | POA: Diagnosis not present

## 2016-01-11 DIAGNOSIS — L97513 Non-pressure chronic ulcer of other part of right foot with necrosis of muscle: Secondary | ICD-10-CM | POA: Diagnosis not present

## 2016-01-11 DIAGNOSIS — E11621 Type 2 diabetes mellitus with foot ulcer: Secondary | ICD-10-CM | POA: Diagnosis not present

## 2016-01-13 DIAGNOSIS — I10 Essential (primary) hypertension: Secondary | ICD-10-CM | POA: Diagnosis not present

## 2016-01-13 DIAGNOSIS — I70261 Atherosclerosis of native arteries of extremities with gangrene, right leg: Secondary | ICD-10-CM | POA: Diagnosis not present

## 2016-01-13 DIAGNOSIS — Z7984 Long term (current) use of oral hypoglycemic drugs: Secondary | ICD-10-CM | POA: Diagnosis not present

## 2016-01-13 DIAGNOSIS — Z7902 Long term (current) use of antithrombotics/antiplatelets: Secondary | ICD-10-CM | POA: Diagnosis not present

## 2016-01-13 DIAGNOSIS — E1152 Type 2 diabetes mellitus with diabetic peripheral angiopathy with gangrene: Secondary | ICD-10-CM | POA: Diagnosis not present

## 2016-01-13 DIAGNOSIS — L97513 Non-pressure chronic ulcer of other part of right foot with necrosis of muscle: Secondary | ICD-10-CM | POA: Diagnosis not present

## 2016-01-13 DIAGNOSIS — Z4800 Encounter for change or removal of nonsurgical wound dressing: Secondary | ICD-10-CM | POA: Diagnosis not present

## 2016-01-14 ENCOUNTER — Encounter
Admission: RE | Admit: 2016-01-14 | Discharge: 2016-01-14 | Disposition: A | Discharge status: Home / Self Care | Payer: Medicare Other | Source: Ambulatory Visit | Attending: Podiatry | Admitting: Podiatry

## 2016-01-14 DIAGNOSIS — Z79899 Other long term (current) drug therapy: Secondary | ICD-10-CM | POA: Diagnosis not present

## 2016-01-14 DIAGNOSIS — Z89412 Acquired absence of left great toe: Secondary | ICD-10-CM | POA: Diagnosis not present

## 2016-01-14 DIAGNOSIS — Z7982 Long term (current) use of aspirin: Secondary | ICD-10-CM | POA: Diagnosis not present

## 2016-01-14 DIAGNOSIS — E1152 Type 2 diabetes mellitus with diabetic peripheral angiopathy with gangrene: Secondary | ICD-10-CM | POA: Diagnosis not present

## 2016-01-14 DIAGNOSIS — Z7902 Long term (current) use of antithrombotics/antiplatelets: Secondary | ICD-10-CM | POA: Diagnosis not present

## 2016-01-14 DIAGNOSIS — Z7984 Long term (current) use of oral hypoglycemic drugs: Secondary | ICD-10-CM | POA: Diagnosis not present

## 2016-01-14 DIAGNOSIS — Z9889 Other specified postprocedural states: Secondary | ICD-10-CM | POA: Diagnosis not present

## 2016-01-14 DIAGNOSIS — I10 Essential (primary) hypertension: Secondary | ICD-10-CM | POA: Diagnosis not present

## 2016-01-14 DIAGNOSIS — L97513 Non-pressure chronic ulcer of other part of right foot with necrosis of muscle: Secondary | ICD-10-CM | POA: Diagnosis not present

## 2016-01-14 DIAGNOSIS — E11621 Type 2 diabetes mellitus with foot ulcer: Secondary | ICD-10-CM | POA: Diagnosis not present

## 2016-01-14 HISTORY — DX: Peripheral vascular disease, unspecified: I73.9

## 2016-01-14 MED ORDER — CEFAZOLIN SODIUM-DEXTROSE 2-4 GM/100ML-% IV SOLN
2.0000 g | Freq: Once | INTRAVENOUS | Status: AC
  Administered 2016-01-15: 2 g via INTRAVENOUS

## 2016-01-14 NOTE — Pre-Procedure Instructions (Signed)
DR FOWLER'S NOTIFIED LD ASPIRIN AND PLAVIX THIS AM. SPOKE WITH CINDY

## 2016-01-14 NOTE — Patient Instructions (Signed)
  Your procedure is scheduled on: 01/15/16   Report to Day Surgery. [facility]LOOR To find out your arrival time please call [phone] between 1PM - 3PM on  01/14/16  Remember: Instructions that are not followed completely may result in serious medical risk, up to and including death, or upon the discretion of your surgeon and anesthesiologist your surgery may need to be rescheduled.    _X___ 1. Do not eat food or drink liquids after midnight. No gum chewing or hard candies.     ____ 2. No Alcohol for 24 hours before or after surgery.   ____ 3. Do Not Smoke For 24 Hours Prior to Your Surgery.   ____ 4. Bring all medications with you on the day of surgery if instructed.    X____ 5. Notify your doctor if there is any change in your medical condition     (cold, fever, infections).       Do not wear jewelry, make-up, hairpins, clips or nail polish.  Do not wear lotions, powders, or perfumes. You may wear deodorant.  Do not shave 48 hours prior to surgery. Men may shave face and neck.  Do not bring valuables to the hospital.    [facility] is [facility] any belongings or valuables.               Contacts, dentures or bridgework may not be worn into surgery.  Leave your suitcase in the car. After surgery it may be brought to your room.  For patients admitted to the hospital, discharge time is determined by your                treatment team.   Patients discharged the day of surgery will not be allowed to drive home.     X__ Take these medicines the morning of surgery with A SIP OF WATER:    1.LISINOPRIL  2.METOPROLOL  3.   4.  5.  6.  ____ Fleet Enema (as directed)   _X___ Use CHG Soap as directed  ____ Use inhalers on the day of surgery  _X___ Stop metformin 2 days prior to surgery DO NOT TAKE TONIGHT OR IN AM  ____ Take 1/2 of usual insulin dose the night before surgery and none on the morning of surgery.   ____ Stop Coumadin/Plavix/aspirin on    ____ Stop Anti-inflammatories on    ____ Stop supplements until after surgery.    ____ Bring C-Pap to the hospital.

## 2016-01-15 ENCOUNTER — Encounter: Payer: Self-pay | Admitting: *Deleted

## 2016-01-15 ENCOUNTER — Encounter
Admission: RE | Disposition: A | Discharge status: Home / Self Care | Payer: Self-pay | Source: Ambulatory Visit | Attending: Podiatry

## 2016-01-15 ENCOUNTER — Ambulatory Visit
Admission: RE | Admit: 2016-01-15 | Discharge: 2016-01-15 | Disposition: A | Discharge status: Home / Self Care | Payer: Medicare Other | Source: Ambulatory Visit | Attending: Podiatry | Admitting: Podiatry

## 2016-01-15 ENCOUNTER — Ambulatory Visit: Payer: Medicare Other | Admitting: Anesthesiology

## 2016-01-15 DIAGNOSIS — E11621 Type 2 diabetes mellitus with foot ulcer: Secondary | ICD-10-CM | POA: Insufficient documentation

## 2016-01-15 DIAGNOSIS — E1152 Type 2 diabetes mellitus with diabetic peripheral angiopathy with gangrene: Secondary | ICD-10-CM | POA: Diagnosis not present

## 2016-01-15 DIAGNOSIS — Z89412 Acquired absence of left great toe: Secondary | ICD-10-CM | POA: Insufficient documentation

## 2016-01-15 DIAGNOSIS — L97519 Non-pressure chronic ulcer of other part of right foot with unspecified severity: Secondary | ICD-10-CM | POA: Diagnosis not present

## 2016-01-15 DIAGNOSIS — L97513 Non-pressure chronic ulcer of other part of right foot with necrosis of muscle: Secondary | ICD-10-CM | POA: Insufficient documentation

## 2016-01-15 DIAGNOSIS — E0852 Diabetes mellitus due to underlying condition with diabetic peripheral angiopathy with gangrene: Secondary | ICD-10-CM | POA: Diagnosis not present

## 2016-01-15 DIAGNOSIS — I10 Essential (primary) hypertension: Secondary | ICD-10-CM | POA: Insufficient documentation

## 2016-01-15 DIAGNOSIS — Z79899 Other long term (current) drug therapy: Secondary | ICD-10-CM | POA: Insufficient documentation

## 2016-01-15 DIAGNOSIS — Z9889 Other specified postprocedural states: Secondary | ICD-10-CM | POA: Insufficient documentation

## 2016-01-15 DIAGNOSIS — Z7984 Long term (current) use of oral hypoglycemic drugs: Secondary | ICD-10-CM | POA: Diagnosis not present

## 2016-01-15 DIAGNOSIS — Z7982 Long term (current) use of aspirin: Secondary | ICD-10-CM | POA: Insufficient documentation

## 2016-01-15 DIAGNOSIS — Z7902 Long term (current) use of antithrombotics/antiplatelets: Secondary | ICD-10-CM | POA: Insufficient documentation

## 2016-01-15 DIAGNOSIS — I96 Gangrene, not elsewhere classified: Secondary | ICD-10-CM | POA: Diagnosis not present

## 2016-01-15 DIAGNOSIS — L97509 Non-pressure chronic ulcer of other part of unspecified foot with unspecified severity: Secondary | ICD-10-CM | POA: Diagnosis not present

## 2016-01-15 DIAGNOSIS — I739 Peripheral vascular disease, unspecified: Secondary | ICD-10-CM | POA: Diagnosis not present

## 2016-01-15 HISTORY — PX: AMPUTATION TOE: SHX6595

## 2016-01-15 LAB — GLUCOSE, CAPILLARY
Glucose-Capillary: 130 mg/dL — ABNORMAL HIGH (ref 65–99)
Glucose-Capillary: 139 mg/dL — ABNORMAL HIGH (ref 65–99)

## 2016-01-15 SURGERY — AMPUTATION, TOE
Anesthesia: General | Laterality: Right

## 2016-01-15 MED ORDER — SODIUM CHLORIDE 0.9 % IV SOLN
INTRAVENOUS | Status: DC
  Administered 2016-01-15: 07:00:00 via INTRAVENOUS

## 2016-01-15 MED ORDER — HYDROCODONE-ACETAMINOPHEN 5-325 MG PO TABS: 1.0000 | ORAL_TABLET | Freq: Four times a day (QID) | ORAL | 0 refills | Status: DC | PRN

## 2016-01-15 MED ORDER — BUPIVACAINE HCL (PF) 0.5 % IJ SOLN
INTRAMUSCULAR | Status: AC
  Filled 2016-01-15: qty 30

## 2016-01-15 MED ORDER — METOPROLOL TARTRATE 50 MG PO TABS
50.0000 mg | ORAL_TABLET | Freq: Once | ORAL | Status: AC
  Administered 2016-01-15: 50 mg via ORAL

## 2016-01-15 MED ORDER — FAMOTIDINE 20 MG PO TABS
ORAL_TABLET | ORAL | Status: AC
  Filled 2016-01-15: qty 1

## 2016-01-15 MED ORDER — OXYCODONE HCL 5 MG PO TABS: 5.0000 mg | ORAL_TABLET | Freq: Once | ORAL | Status: DC | PRN

## 2016-01-15 MED ORDER — LIDOCAINE HCL (PF) 1 % IJ SOLN
INTRAMUSCULAR | Status: AC
  Filled 2016-01-15: qty 30

## 2016-01-15 MED ORDER — PROPOFOL 10 MG/ML IV BOLUS
INTRAVENOUS | Status: DC | PRN
  Administered 2016-01-15: 120 mg via INTRAVENOUS

## 2016-01-15 MED ORDER — METOPROLOL TARTRATE 50 MG PO TABS
ORAL_TABLET | ORAL | Status: AC
  Filled 2016-01-15: qty 1

## 2016-01-15 MED ORDER — FAMOTIDINE 20 MG PO TABS
20.0000 mg | ORAL_TABLET | Freq: Once | ORAL | Status: AC
  Administered 2016-01-15: 20 mg via ORAL

## 2016-01-15 MED ORDER — LIDOCAINE HCL (CARDIAC) 20 MG/ML IV SOLN
INTRAVENOUS | Status: DC | PRN
  Administered 2016-01-15: 100 mg via INTRAVENOUS

## 2016-01-15 MED ORDER — OXYCODONE HCL 5 MG/5ML PO SOLN: 5.0000 mg | Freq: Once | ORAL | Status: DC | PRN

## 2016-01-15 MED ORDER — FENTANYL CITRATE (PF) 100 MCG/2ML IJ SOLN: 25.0000 ug | INTRAMUSCULAR | Status: DC | PRN

## 2016-01-15 MED ORDER — LIDOCAINE HCL (PF) 1 % IJ SOLN
INTRAMUSCULAR | Status: DC | PRN
  Administered 2016-01-15: 7.5 mL

## 2016-01-15 MED ORDER — BUPIVACAINE HCL 0.5 % IJ SOLN
INTRAMUSCULAR | Status: DC | PRN
  Administered 2016-01-15: 7.5 mL

## 2016-01-15 MED ORDER — EPHEDRINE SULFATE 50 MG/ML IJ SOLN
INTRAMUSCULAR | Status: DC | PRN
  Administered 2016-01-15 (×2): 5 mg via INTRAVENOUS

## 2016-01-15 MED ORDER — ONDANSETRON HCL 4 MG/2ML IJ SOLN
INTRAMUSCULAR | Status: DC | PRN
  Administered 2016-01-15: 4 mg via INTRAVENOUS

## 2016-01-15 MED ORDER — CEFAZOLIN SODIUM-DEXTROSE 2-4 GM/100ML-% IV SOLN
INTRAVENOUS | Status: AC
  Filled 2016-01-15: qty 100

## 2016-01-15 SURGICAL SUPPLY — 43 items
BANDAGE ELASTIC 4 LF NS (GAUZE/BANDAGES/DRESSINGS) ×2 IMPLANT
BANDAGE STRETCH 3X4.1 STRL (GAUZE/BANDAGES/DRESSINGS) ×4 IMPLANT
BLADE OSC/SAGITTAL MD 5.5X18 (BLADE) ×1 IMPLANT
BLADE SURG MINI STRL (BLADE) ×2 IMPLANT
BNDG CMPR 75X21 PLY HI ABS (MISCELLANEOUS) ×1
BNDG CMPR MED 5X4 ELC HKLP NS (GAUZE/BANDAGES/DRESSINGS) ×1
BNDG ESMARK 4X12 TAN STRL LF (GAUZE/BANDAGES/DRESSINGS) ×2 IMPLANT
BNDG GAUZE 4.5X4.1 6PLY STRL (MISCELLANEOUS) ×2 IMPLANT
CANISTER SUCT 1200ML W/VALVE (MISCELLANEOUS) ×2 IMPLANT
DRAPE FLUOR MINI C-ARM 54X84 (DRAPES) ×1 IMPLANT
DRAPE XRAY CASSETTE 23X24 (DRAPES) ×1 IMPLANT
DURAPREP 26ML APPLICATOR (WOUND CARE) ×2 IMPLANT
ELECT REM PT RETURN 9FT ADLT (ELECTROSURGICAL) ×2
ELECTRODE REM PT RTRN 9FT ADLT (ELECTROSURGICAL) ×1 IMPLANT
GAUZE IODOFORM PACK 1/2 7832 (GAUZE/BANDAGES/DRESSINGS) ×2 IMPLANT
GAUZE PETRO XEROFOAM 1X8 (MISCELLANEOUS) ×2 IMPLANT
GAUZE SPONGE 4X4 12PLY STRL (GAUZE/BANDAGES/DRESSINGS) ×2 IMPLANT
GAUZE STRETCH 2X75IN STRL (MISCELLANEOUS) ×2 IMPLANT
GLOVE BIO SURGEON STRL SZ7.5 (GLOVE) ×3 IMPLANT
GLOVE INDICATOR 8.0 STRL GRN (GLOVE) ×3 IMPLANT
GOWN STRL REUS W/ TWL LRG LVL3 (GOWN DISPOSABLE) ×2 IMPLANT
GOWN STRL REUS W/TWL LRG LVL3 (GOWN DISPOSABLE) ×4
HANDPIECE INTERPULSE COAX TIP (DISPOSABLE)
KIT RM TURNOVER STRD PROC AR (KITS) ×2 IMPLANT
LABEL OR SOLS (LABEL) ×2 IMPLANT
NDL FILTER BLUNT 18X1 1/2 (NEEDLE) ×1 IMPLANT
NEEDLE FILTER BLUNT 18X 1/2SAF (NEEDLE) ×1
NEEDLE FILTER BLUNT 18X1 1/2 (NEEDLE) ×1 IMPLANT
NEEDLE HYPO 25X1 1.5 SAFETY (NEEDLE) ×2 IMPLANT
NS IRRIG 500ML POUR BTL (IV SOLUTION) ×2 IMPLANT
PACK EXTREMITY ARMC (MISCELLANEOUS) ×2 IMPLANT
PAD ABD DERMACEA PRESS 5X9 (GAUZE/BANDAGES/DRESSINGS) ×4 IMPLANT
SET HNDPC FAN SPRY TIP SCT (DISPOSABLE) ×1 IMPLANT
SOL .9 NS 3000ML IRR  AL (IV SOLUTION) ×1
SOL .9 NS 3000ML IRR AL (IV SOLUTION) ×1
SOL .9 NS 3000ML IRR UROMATIC (IV SOLUTION) ×1 IMPLANT
STOCKINETTE M/LG 89821 (MISCELLANEOUS) ×2 IMPLANT
STRAP SAFETY BODY (MISCELLANEOUS) ×2 IMPLANT
SUT ETHILON 3-0 FS-10 30 BLK (SUTURE) ×2
SUT ETHILON 5-0 FS-2 18 BLK (SUTURE) ×2 IMPLANT
SUT VIC AB 4-0 FS2 27 (SUTURE) ×2 IMPLANT
SUTURE EHLN 3-0 FS-10 30 BLK (SUTURE) ×1 IMPLANT
SYRINGE 10CC LL (SYRINGE) ×6 IMPLANT

## 2016-01-15 NOTE — Discharge Instructions (Signed)
[facility] °[facility] ° °POST OPERATIVE INSTRUCTIONS FOR DR. TROXLER AND DR. Munirah Doerner °[facility] ° ° °1. Take your medication as prescribed.  Pain medication should be taken only as needed. ° °2. Keep the dressing clean, dry and intact. ° °3. Keep your foot elevated above the heart level for the first 48 hours. ° °4. Walking to the bathroom and brief periods of walking are acceptable, unless we have instructed you to be non-weight bearing. ° °5. Always wear your post-op shoe when walking.  Always use your crutches if you are to be non-weight bearing. ° °6. Do not take a shower. Baths are permissible as long as the foot is kept out of the water.  ° °7. Every hour you are awake:  °- Bend your knee 15 times. °- Flex foot 15 times °- Massage calf 15 times ° °8. Call [facility] ([phone]) if any of the following problems occur: °- You develop a temperature or fever. °- The bandage becomes saturated with blood. °- Medication does not stop your pain. °- Injury of the foot occurs. °- Any symptoms of infection including redness, odor, or red streaks running from wound. °-  ° °

## 2016-01-15 NOTE — H&P (Signed)
HISTORY AND PHYSICAL INTERVAL NOTE:  01/15/2016  7:29 AM  Jose Schroeder  has presented today for surgery, with the diagnosis of ULCER OF FOOT ,PVD,DIABETIC with gangrene of toes.  The various methods of treatment have been discussed with the patient.  No guarantees were given.  After consideration of risks, benefits and other options for treatment, the patient has consented to surgery.  I have reviewed the patients' chart and labs.    Patient Vitals for the past 24 hrs:  BP Temp Temp src Pulse Resp SpO2 Height Weight  01/15/16 0615 (!) 162/94 97.7 F (36.5 C) Oral 89 17 100 % 6\' 2"  (1.88 m) 71.2 kg (157 lb)    A history and physical examination was performed in my office.  The patient was reexamined.  There have been no changes to this history and physical examination.  Jose Schroeder, Jose Schroeder A

## 2016-01-15 NOTE — Anesthesia Procedure Notes (Signed)
Procedure Name: LMA Insertion Date/Time: 01/15/2016 7:36 AM Performed by: Jannet MantisPACE, Cesareo Vickrey Pre-anesthesia Checklist: Patient identified, Emergency Drugs available, Suction available and Patient being monitored Patient Re-evaluated:Patient Re-evaluated prior to inductionOxygen Delivery Method: Circle system utilized Preoxygenation: Pre-oxygenation with 100% oxygen Intubation Type: IV induction Ventilation: Mask ventilation without difficulty LMA: LMA inserted LMA Size: 4.0 Number of attempts: 1 Tube secured with: Tape Dental Injury: Teeth and Oropharynx as per pre-operative assessment

## 2016-01-15 NOTE — Anesthesia Preprocedure Evaluation (Signed)
Anesthesia Evaluation  Patient identified by MRN, date of birth, ID band Patient awake    Reviewed: Allergy & Precautions, H&P , NPO status , Patient's Chart, lab work & pertinent test results  History of Anesthesia Complications Negative for: history of anesthetic complications  Airway Mallampati: III  TM Distance: >3 FB Neck ROM: limited    Dental  (+) Poor Dentition, Chipped, Missing   Pulmonary neg pulmonary ROS, neg shortness of breath,    Pulmonary exam normal breath sounds clear to auscultation       Cardiovascular Exercise Tolerance: Good hypertension, (-) angina+ Peripheral Vascular Disease  (-) Past MI and (-) DOE Normal cardiovascular exam Rhythm:regular Rate:Normal     Neuro/Psych negative neurological ROS  negative psych ROS   GI/Hepatic negative GI ROS, Neg liver ROS,   Endo/Other  diabetes, Type 2  Renal/GU      Musculoskeletal   Abdominal   Peds  Hematology negative hematology ROS (+)   Anesthesia Other Findings Past Medical History: No date: Diabetes mellitus without complication (HCC) No date: Hypertension No date: Peripheral vascular disease (HCC)  Past Surgical History: 01/07/2016: PERIPHERAL VASCULAR CATHETERIZATION Right     Comment: Procedure: Abdominal Aortogram w/Lower               Extremity;  Surgeon: Renford DillsGregory G Schnier, MD;                Location: [facility] INVASIVE CV LAB;  Service:               Cardiovascular;  Laterality: Right; No date: TOE SURGERY     Comment: amputated 2 toes on L) foot  BMI    Body Mass Index:  20.16 kg/m      Reproductive/Obstetrics negative OB ROS                             Anesthesia Physical Anesthesia Plan  ASA: III  Anesthesia Plan: General LMA   Post-op Pain Management:    Induction:   Airway Management Planned:   Additional Equipment:   Intra-op Plan:   Post-operative Plan:   Informed Consent: I have  reviewed the patients History and Physical, chart, labs and discussed the procedure including the risks, benefits and alternatives for the proposed anesthesia with the patient or authorized representative who has indicated his/her understanding and acceptance.   Dental Advisory Given  Plan Discussed with: Anesthesiologist, CRNA and Surgeon  Anesthesia Plan Comments:         Anesthesia Quick Evaluation

## 2016-01-15 NOTE — Anesthesia Postprocedure Evaluation (Signed)
Anesthesia Post Note  Patient: Jose Schroeder  Procedure(s) Performed: Procedure(s) (LRB): AMPUTATION TOE (Right)  Patient location during evaluation: PACU Anesthesia Type: General Level of consciousness: awake and alert Pain management: pain level controlled Vital Signs Assessment: post-procedure vital signs reviewed and stable Respiratory status: spontaneous breathing, nonlabored ventilation, respiratory function stable and patient connected to nasal cannula oxygen Cardiovascular status: blood pressure returned to baseline and stable Postop Assessment: no signs of nausea or vomiting Anesthetic complications: no    Last Vitals:  Vitals:   01/15/16 0909 01/15/16 0941  BP: (!) 179/87 (!) 158/73  Pulse: 78 82  Resp: 14   Temp: (!) 35.9 C     Last Pain:  Vitals:   01/15/16 0909  TempSrc: Tympanic  PainSc:                  [name]

## 2016-01-15 NOTE — Op Note (Signed)
Operative note   Surgeon:Dmarcus Decicco [employer] logistics/support/administrative officerowler    Assistant: None    Preop diagnosis: Gangrene right second and third toes    Postop diagnosis: Same    Procedure: Amputation right second and third toes at MTPJ    EBL: Minimal    Anesthesia:local and general    Hemostasis: None    Specimen: Gangrenous second and third toes    Complications: None    Operative indications:Jose Schroeder is an 82 y.o. that presents today for surgical intervention.  The risks/benefits/alternatives/complications have been discussed and consent has been given.    Procedure:  Patient was brought into the OR and placed on the operating table in thesupine position. After anesthesia was obtained theright lower extremity was prepped and draped in usual sterile fashion. Initially the second and third toes were anesthetized with a total of 18 cc of a 11 mixture of 0.5% Marcaine and 1% lidocaine plain.  After sterile prep and drape attention was directed to the second and third toes where 2 semielliptical incisions were made beginning on the medial aspect of the second toe at the apex proximal at the second base of the second and third metatarsals. Semielliptical incision was made around the completely gangrenous third toe. Full-thickness dissection was taken down to the MTPJ's. The toes were then disarticulated and removed from the surgical field in toto. Minimal bleeding was noted. The wound was flushed with 500 mL's of sterile saline with a bulb syringe. Closure of the skin was performed with a 3-0 nylon. A large bulky sterile dressing was then applied.    Patient tolerated the procedure and anesthesia well.  Was transported from the OR to the PACU with all vital signs stable and vascular status intact. To be discharged per routine protocol.  Will follow up in approximately 1 week in the outpatient clinic.

## 2016-01-15 NOTE — Transfer of Care (Signed)
Immediate Anesthesia Transfer of Care Note  Patient: Jose Schroeder  Procedure(s) Performed: Procedure(s): AMPUTATION TOE (Right)  Patient Location: PACU  Anesthesia Type:General  Level of Consciousness: awake  Airway & Oxygen Therapy: Patient Spontanous Breathing  Post-op Assessment: Report given to RN  Post vital signs: stable  Last Vitals:  Vitals:   01/15/16 0615 01/15/16 0821  BP: (!) 162/94 (!) 146/75  Pulse: 89 70  Resp: 17 12  Temp: 36.5 C 36.3 C    Last Pain:  Vitals:   01/15/16 0615  TempSrc: Oral         Complications: No apparent anesthesia complications

## 2016-01-17 DIAGNOSIS — L97513 Non-pressure chronic ulcer of other part of right foot with necrosis of muscle: Secondary | ICD-10-CM | POA: Diagnosis not present

## 2016-01-17 DIAGNOSIS — Z7902 Long term (current) use of antithrombotics/antiplatelets: Secondary | ICD-10-CM | POA: Diagnosis not present

## 2016-01-17 DIAGNOSIS — E1152 Type 2 diabetes mellitus with diabetic peripheral angiopathy with gangrene: Secondary | ICD-10-CM | POA: Diagnosis not present

## 2016-01-17 DIAGNOSIS — I10 Essential (primary) hypertension: Secondary | ICD-10-CM | POA: Diagnosis not present

## 2016-01-17 DIAGNOSIS — Z4800 Encounter for change or removal of nonsurgical wound dressing: Secondary | ICD-10-CM | POA: Diagnosis not present

## 2016-01-17 DIAGNOSIS — I70261 Atherosclerosis of native arteries of extremities with gangrene, right leg: Secondary | ICD-10-CM | POA: Diagnosis not present

## 2016-01-17 DIAGNOSIS — Z7984 Long term (current) use of oral hypoglycemic drugs: Secondary | ICD-10-CM | POA: Diagnosis not present

## 2016-01-19 DIAGNOSIS — Z7984 Long term (current) use of oral hypoglycemic drugs: Secondary | ICD-10-CM | POA: Diagnosis not present

## 2016-01-19 DIAGNOSIS — I70261 Atherosclerosis of native arteries of extremities with gangrene, right leg: Secondary | ICD-10-CM | POA: Diagnosis not present

## 2016-01-19 DIAGNOSIS — E1152 Type 2 diabetes mellitus with diabetic peripheral angiopathy with gangrene: Secondary | ICD-10-CM | POA: Diagnosis not present

## 2016-01-19 DIAGNOSIS — Z4800 Encounter for change or removal of nonsurgical wound dressing: Secondary | ICD-10-CM | POA: Diagnosis not present

## 2016-01-19 DIAGNOSIS — I10 Essential (primary) hypertension: Secondary | ICD-10-CM | POA: Diagnosis not present

## 2016-01-19 DIAGNOSIS — Z7902 Long term (current) use of antithrombotics/antiplatelets: Secondary | ICD-10-CM | POA: Diagnosis not present

## 2016-01-19 DIAGNOSIS — L97513 Non-pressure chronic ulcer of other part of right foot with necrosis of muscle: Secondary | ICD-10-CM | POA: Diagnosis not present

## 2016-01-19 LAB — SURGICAL PATHOLOGY

## 2016-01-20 DIAGNOSIS — I70261 Atherosclerosis of native arteries of extremities with gangrene, right leg: Secondary | ICD-10-CM | POA: Diagnosis not present

## 2016-01-20 DIAGNOSIS — Z7902 Long term (current) use of antithrombotics/antiplatelets: Secondary | ICD-10-CM | POA: Diagnosis not present

## 2016-01-20 DIAGNOSIS — L97513 Non-pressure chronic ulcer of other part of right foot with necrosis of muscle: Secondary | ICD-10-CM | POA: Diagnosis not present

## 2016-01-20 DIAGNOSIS — E1152 Type 2 diabetes mellitus with diabetic peripheral angiopathy with gangrene: Secondary | ICD-10-CM | POA: Diagnosis not present

## 2016-01-20 DIAGNOSIS — Z7984 Long term (current) use of oral hypoglycemic drugs: Secondary | ICD-10-CM | POA: Diagnosis not present

## 2016-01-20 DIAGNOSIS — Z4800 Encounter for change or removal of nonsurgical wound dressing: Secondary | ICD-10-CM | POA: Diagnosis not present

## 2016-01-20 DIAGNOSIS — I10 Essential (primary) hypertension: Secondary | ICD-10-CM | POA: Diagnosis not present

## 2016-01-21 DIAGNOSIS — I70261 Atherosclerosis of native arteries of extremities with gangrene, right leg: Secondary | ICD-10-CM | POA: Diagnosis not present

## 2016-01-21 DIAGNOSIS — Z7984 Long term (current) use of oral hypoglycemic drugs: Secondary | ICD-10-CM | POA: Diagnosis not present

## 2016-01-21 DIAGNOSIS — Z4800 Encounter for change or removal of nonsurgical wound dressing: Secondary | ICD-10-CM | POA: Diagnosis not present

## 2016-01-21 DIAGNOSIS — E1152 Type 2 diabetes mellitus with diabetic peripheral angiopathy with gangrene: Secondary | ICD-10-CM | POA: Diagnosis not present

## 2016-01-21 DIAGNOSIS — L97513 Non-pressure chronic ulcer of other part of right foot with necrosis of muscle: Secondary | ICD-10-CM | POA: Diagnosis not present

## 2016-01-21 DIAGNOSIS — I10 Essential (primary) hypertension: Secondary | ICD-10-CM | POA: Diagnosis not present

## 2016-01-21 DIAGNOSIS — Z7902 Long term (current) use of antithrombotics/antiplatelets: Secondary | ICD-10-CM | POA: Diagnosis not present

## 2016-01-22 ENCOUNTER — Encounter: Payer: Self-pay | Admitting: Family Medicine

## 2016-01-22 ENCOUNTER — Ambulatory Visit (INDEPENDENT_AMBULATORY_CARE_PROVIDER_SITE_OTHER): Payer: Medicare Other | Admitting: Family Medicine

## 2016-01-22 VITALS — BP 140/80 | HR 80 | Temp 97.9°F | Ht 74.0 in | Wt 155.0 lb

## 2016-01-22 DIAGNOSIS — E1152 Type 2 diabetes mellitus with diabetic peripheral angiopathy with gangrene: Secondary | ICD-10-CM

## 2016-01-22 DIAGNOSIS — I70261 Atherosclerosis of native arteries of extremities with gangrene, right leg: Secondary | ICD-10-CM | POA: Diagnosis not present

## 2016-01-22 DIAGNOSIS — Z4800 Encounter for change or removal of nonsurgical wound dressing: Secondary | ICD-10-CM | POA: Diagnosis not present

## 2016-01-22 DIAGNOSIS — Z09 Encounter for follow-up examination after completed treatment for conditions other than malignant neoplasm: Secondary | ICD-10-CM | POA: Diagnosis not present

## 2016-01-22 DIAGNOSIS — I10 Essential (primary) hypertension: Secondary | ICD-10-CM | POA: Diagnosis not present

## 2016-01-22 DIAGNOSIS — Z7902 Long term (current) use of antithrombotics/antiplatelets: Secondary | ICD-10-CM | POA: Diagnosis not present

## 2016-01-22 DIAGNOSIS — Z7984 Long term (current) use of oral hypoglycemic drugs: Secondary | ICD-10-CM | POA: Diagnosis not present

## 2016-01-22 DIAGNOSIS — L97513 Non-pressure chronic ulcer of other part of right foot with necrosis of muscle: Secondary | ICD-10-CM | POA: Diagnosis not present

## 2016-01-22 NOTE — Progress Notes (Signed)
Name: Jose Schroeder   MRN: [id number]    DOB: May 23, 1932   Date:01/22/2016       Progress Note  Subjective  Chief Complaint  Chief Complaint  Patient presents with  . Follow-up    hospital discharge 01/08/2016- amputated 2 toes due to infection- "ain't been doing too good. I'm having pain in my leg and sweating alot at night"    Patient presents for hospital followup.   Foot Injury   The incident occurred more than 1 week ago. Incident location: toe amputation hosp. The quality of the pain is described as aching. The pain is moderate. The pain has been fluctuating since onset. Pertinent negatives include no tingling. The symptoms are aggravated by movement. The treatment provided moderate relief.  Diabetes  He has type 2 diabetes mellitus. His disease course has been stable. Pertinent negatives for hypoglycemia include no dizziness, headaches or nervousness/anxiousness. Pertinent negatives for diabetes include no blurred vision, no chest pain, no polydipsia and no weight loss. There are no hypoglycemic complications. Symptoms are stable. There are no diabetic complications. His weight is stable. He is following a generally healthy diet. His breakfast blood glucose is taken between 8-9 am. His breakfast blood glucose range is generally 140-180 mg/dl.    No problem-specific Assessment & Plan notes found for this encounter.   Past Medical History:  Diagnosis Date  . Diabetes mellitus without complication (HCC)   . Hypertension   . Peripheral vascular disease [facility](HCC)     Past Surgical History:  Procedure Laterality Date  . AMPUTATION TOE Right 01/15/2016   Procedure: AMPUTATION TOE;  Surgeon: Gwyneth RevelsJustin Fowler, DPM;  Location: [facility] ORS;  Service: Podiatry;  Laterality: Right;  . PERIPHERAL VASCULAR CATHETERIZATION Right 01/07/2016   Procedure: Abdominal Aortogram w/Lower Extremity;  Surgeon: Renford DillsGregory G Schnier, MD;  Location: [facility] INVASIVE CV LAB;  Service: Cardiovascular;  Laterality:  Right;  . TOE SURGERY     amputated 2 toes on L) foot    Family History  Problem Relation Age of Onset  . Diabetes Mother   . Hypertension Mother   . Diabetes Father   . Hypertension Father     Social History   Social History  . Marital status: Married    Spouse name: N/A  . Number of children: N/A  . Years of education: N/A   Occupational History  . Not on file.   Social History Main Topics  . Smoking status: Never Smoker  . Smokeless tobacco: Never Used  . Alcohol use No  . Drug use: No  . Sexual activity: Not Currently   Other Topics Concern  . Not on file   Social History Narrative  . No narrative on file    No Known Allergies   Review of Systems  Constitutional: Negative for chills, fever, malaise/fatigue and weight loss.  HENT: Negative for ear discharge, ear pain and sore throat.   Eyes: Negative for blurred vision.  Respiratory: Negative for cough, sputum production, shortness of breath and wheezing.   Cardiovascular: Negative for chest pain, palpitations and leg swelling.  Gastrointestinal: Negative for abdominal pain, blood in stool, constipation, diarrhea, heartburn, melena and nausea.  Genitourinary: Negative for dysuria, frequency, hematuria and urgency.  Musculoskeletal: Negative for back pain, joint pain, myalgias and neck pain.  Skin: Negative for rash.  Neurological: Negative for dizziness, tingling, sensory change, focal weakness and headaches.  Endo/Heme/Allergies: Negative for environmental allergies and polydipsia. Does not bruise/bleed easily.  Psychiatric/Behavioral: Negative for depression and suicidal ideas. The  patient is not nervous/anxious and does not have insomnia.      Objective  Vitals:   01/22/16 1011  BP: 140/80  Pulse: 80  Temp: 97.9 F (36.6 C)  TempSrc: Oral  Weight: 155 lb (70.3 kg)  Height: 6\' 2"  (1.88 m)    Physical Exam  Constitutional: He is oriented to person, place, and time and well-developed,  well-nourished, and in no distress.  HENT:  Head: Normocephalic.  Right Ear: External ear normal.  Left Ear: External ear normal.  Nose: Nose normal.  Mouth/Throat: Oropharynx is clear and moist.  Eyes: Conjunctivae and EOM are normal. Pupils are equal, round, and reactive to light. Right eye exhibits no discharge. Left eye exhibits no discharge. No scleral icterus.  Neck: Normal range of motion. Neck supple. No JVD present. No tracheal deviation present. No thyromegaly present.  Cardiovascular: Normal rate, regular rhythm, normal heart sounds and intact distal pulses.  Exam reveals no gallop and no friction rub.   No murmur heard. Pulmonary/Chest: Breath sounds normal. No respiratory distress. He has no wheezes. He has no rales.  Abdominal: Soft. Bowel sounds are normal. He exhibits no mass. There is no hepatosplenomegaly. There is no tenderness. There is no rebound, no guarding and no CVA tenderness.  Musculoskeletal: Normal range of motion. He exhibits no tenderness.  Lymphadenopathy:    He has no cervical adenopathy.  Neurological: He is alert and oriented to person, place, and time. He has normal sensation, normal strength, normal reflexes and intact cranial nerves.  Skin: Skin is warm. No rash noted.  Psychiatric: Mood and affect normal.  Nursing note and vitals reviewed.     Assessment & Plan  Problem List Items Addressed This Visit    None    Visit Diagnoses    Hospital discharge follow-up    -  Primary   Type 2 diabetes mellitus with diabetic peripheral angiopathy and gangrene, without long-term current use of insulin (HCC)            Dr. Hayden [facility]  01/22/16

## 2016-01-25 DIAGNOSIS — M79671 Pain in right foot: Secondary | ICD-10-CM | POA: Diagnosis not present

## 2016-01-25 DIAGNOSIS — I96 Gangrene, not elsewhere classified: Secondary | ICD-10-CM | POA: Diagnosis not present

## 2016-01-26 DIAGNOSIS — Z7984 Long term (current) use of oral hypoglycemic drugs: Secondary | ICD-10-CM | POA: Diagnosis not present

## 2016-01-26 DIAGNOSIS — I70261 Atherosclerosis of native arteries of extremities with gangrene, right leg: Secondary | ICD-10-CM | POA: Diagnosis not present

## 2016-01-26 DIAGNOSIS — I10 Essential (primary) hypertension: Secondary | ICD-10-CM | POA: Diagnosis not present

## 2016-01-26 DIAGNOSIS — E1152 Type 2 diabetes mellitus with diabetic peripheral angiopathy with gangrene: Secondary | ICD-10-CM | POA: Diagnosis not present

## 2016-01-26 DIAGNOSIS — Z4800 Encounter for change or removal of nonsurgical wound dressing: Secondary | ICD-10-CM | POA: Diagnosis not present

## 2016-01-26 DIAGNOSIS — L97513 Non-pressure chronic ulcer of other part of right foot with necrosis of muscle: Secondary | ICD-10-CM | POA: Diagnosis not present

## 2016-01-26 DIAGNOSIS — Z7902 Long term (current) use of antithrombotics/antiplatelets: Secondary | ICD-10-CM | POA: Diagnosis not present

## 2016-01-27 DIAGNOSIS — L97513 Non-pressure chronic ulcer of other part of right foot with necrosis of muscle: Secondary | ICD-10-CM | POA: Diagnosis not present

## 2016-01-27 DIAGNOSIS — E1152 Type 2 diabetes mellitus with diabetic peripheral angiopathy with gangrene: Secondary | ICD-10-CM | POA: Diagnosis not present

## 2016-01-27 DIAGNOSIS — Z4800 Encounter for change or removal of nonsurgical wound dressing: Secondary | ICD-10-CM | POA: Diagnosis not present

## 2016-01-27 DIAGNOSIS — Z7984 Long term (current) use of oral hypoglycemic drugs: Secondary | ICD-10-CM | POA: Diagnosis not present

## 2016-01-27 DIAGNOSIS — I70261 Atherosclerosis of native arteries of extremities with gangrene, right leg: Secondary | ICD-10-CM | POA: Diagnosis not present

## 2016-01-27 DIAGNOSIS — Z7902 Long term (current) use of antithrombotics/antiplatelets: Secondary | ICD-10-CM | POA: Diagnosis not present

## 2016-01-27 DIAGNOSIS — I10 Essential (primary) hypertension: Secondary | ICD-10-CM | POA: Diagnosis not present

## 2016-01-28 DIAGNOSIS — Z7984 Long term (current) use of oral hypoglycemic drugs: Secondary | ICD-10-CM | POA: Diagnosis not present

## 2016-01-28 DIAGNOSIS — Z4800 Encounter for change or removal of nonsurgical wound dressing: Secondary | ICD-10-CM | POA: Diagnosis not present

## 2016-01-28 DIAGNOSIS — I70261 Atherosclerosis of native arteries of extremities with gangrene, right leg: Secondary | ICD-10-CM | POA: Diagnosis not present

## 2016-01-28 DIAGNOSIS — E1152 Type 2 diabetes mellitus with diabetic peripheral angiopathy with gangrene: Secondary | ICD-10-CM | POA: Diagnosis not present

## 2016-01-28 DIAGNOSIS — Z7902 Long term (current) use of antithrombotics/antiplatelets: Secondary | ICD-10-CM | POA: Diagnosis not present

## 2016-01-28 DIAGNOSIS — I10 Essential (primary) hypertension: Secondary | ICD-10-CM | POA: Diagnosis not present

## 2016-01-28 DIAGNOSIS — L97513 Non-pressure chronic ulcer of other part of right foot with necrosis of muscle: Secondary | ICD-10-CM | POA: Diagnosis not present

## 2016-01-31 DIAGNOSIS — R9431 Abnormal electrocardiogram [ECG] [EKG]: Secondary | ICD-10-CM | POA: Diagnosis not present

## 2016-01-31 DIAGNOSIS — L97513 Non-pressure chronic ulcer of other part of right foot with necrosis of muscle: Secondary | ICD-10-CM | POA: Diagnosis not present

## 2016-01-31 DIAGNOSIS — Z741 Need for assistance with personal care: Secondary | ICD-10-CM | POA: Diagnosis not present

## 2016-01-31 DIAGNOSIS — M7989 Other specified soft tissue disorders: Secondary | ICD-10-CM | POA: Diagnosis not present

## 2016-01-31 DIAGNOSIS — L97919 Non-pressure chronic ulcer of unspecified part of right lower leg with unspecified severity: Secondary | ICD-10-CM | POA: Diagnosis not present

## 2016-01-31 DIAGNOSIS — E11621 Type 2 diabetes mellitus with foot ulcer: Secondary | ICD-10-CM | POA: Diagnosis not present

## 2016-01-31 DIAGNOSIS — S81801A Unspecified open wound, right lower leg, initial encounter: Secondary | ICD-10-CM | POA: Diagnosis not present

## 2016-01-31 DIAGNOSIS — I4510 Unspecified right bundle-branch block: Secondary | ICD-10-CM | POA: Diagnosis not present

## 2016-01-31 DIAGNOSIS — I361 Nonrheumatic tricuspid (valve) insufficiency: Secondary | ICD-10-CM | POA: Diagnosis not present

## 2016-01-31 DIAGNOSIS — D72829 Elevated white blood cell count, unspecified: Secondary | ICD-10-CM | POA: Diagnosis not present

## 2016-01-31 DIAGNOSIS — R61 Generalized hyperhidrosis: Secondary | ICD-10-CM | POA: Diagnosis not present

## 2016-01-31 DIAGNOSIS — E11649 Type 2 diabetes mellitus with hypoglycemia without coma: Secondary | ICD-10-CM | POA: Diagnosis not present

## 2016-01-31 DIAGNOSIS — I493 Ventricular premature depolarization: Secondary | ICD-10-CM | POA: Diagnosis not present

## 2016-01-31 DIAGNOSIS — E440 Moderate protein-calorie malnutrition: Secondary | ICD-10-CM | POA: Diagnosis not present

## 2016-01-31 DIAGNOSIS — Z89511 Acquired absence of right leg below knee: Secondary | ICD-10-CM | POA: Diagnosis not present

## 2016-01-31 DIAGNOSIS — L089 Local infection of the skin and subcutaneous tissue, unspecified: Secondary | ICD-10-CM | POA: Diagnosis not present

## 2016-01-31 DIAGNOSIS — T814XXA Infection following a procedure, initial encounter: Secondary | ICD-10-CM | POA: Diagnosis not present

## 2016-01-31 DIAGNOSIS — G894 Chronic pain syndrome: Secondary | ICD-10-CM | POA: Diagnosis not present

## 2016-01-31 DIAGNOSIS — B879 Myiasis, unspecified: Secondary | ICD-10-CM | POA: Diagnosis not present

## 2016-01-31 DIAGNOSIS — I491 Atrial premature depolarization: Secondary | ICD-10-CM | POA: Diagnosis not present

## 2016-01-31 DIAGNOSIS — Z89421 Acquired absence of other right toe(s): Secondary | ICD-10-CM | POA: Diagnosis not present

## 2016-01-31 DIAGNOSIS — R531 Weakness: Secondary | ICD-10-CM | POA: Diagnosis not present

## 2016-01-31 DIAGNOSIS — I739 Peripheral vascular disease, unspecified: Secondary | ICD-10-CM | POA: Diagnosis not present

## 2016-01-31 DIAGNOSIS — I517 Cardiomegaly: Secondary | ICD-10-CM | POA: Diagnosis not present

## 2016-01-31 DIAGNOSIS — Z7982 Long term (current) use of aspirin: Secondary | ICD-10-CM | POA: Diagnosis not present

## 2016-01-31 DIAGNOSIS — Z9181 History of falling: Secondary | ICD-10-CM | POA: Diagnosis not present

## 2016-01-31 DIAGNOSIS — G8918 Other acute postprocedural pain: Secondary | ICD-10-CM | POA: Diagnosis not present

## 2016-01-31 DIAGNOSIS — M869 Osteomyelitis, unspecified: Secondary | ICD-10-CM | POA: Diagnosis not present

## 2016-01-31 DIAGNOSIS — I4519 Other right bundle-branch block: Secondary | ICD-10-CM | POA: Diagnosis not present

## 2016-01-31 DIAGNOSIS — M799 Soft tissue disorder, unspecified: Secondary | ICD-10-CM | POA: Diagnosis not present

## 2016-01-31 DIAGNOSIS — T8744 Infection of amputation stump, left lower extremity: Secondary | ICD-10-CM | POA: Diagnosis not present

## 2016-01-31 DIAGNOSIS — R000 Tachycardia, unspecified: Secondary | ICD-10-CM | POA: Diagnosis not present

## 2016-01-31 DIAGNOSIS — T8743 Infection of amputation stump, right lower extremity: Secondary | ICD-10-CM | POA: Diagnosis not present

## 2016-01-31 DIAGNOSIS — Z794 Long term (current) use of insulin: Secondary | ICD-10-CM | POA: Diagnosis not present

## 2016-01-31 DIAGNOSIS — L97519 Non-pressure chronic ulcer of other part of right foot with unspecified severity: Secondary | ICD-10-CM | POA: Diagnosis not present

## 2016-01-31 DIAGNOSIS — E119 Type 2 diabetes mellitus without complications: Secondary | ICD-10-CM | POA: Diagnosis not present

## 2016-01-31 DIAGNOSIS — E1151 Type 2 diabetes mellitus with diabetic peripheral angiopathy without gangrene: Secondary | ICD-10-CM | POA: Diagnosis not present

## 2016-01-31 DIAGNOSIS — I998 Other disorder of circulatory system: Secondary | ICD-10-CM | POA: Diagnosis not present

## 2016-01-31 DIAGNOSIS — R262 Difficulty in walking, not elsewhere classified: Secondary | ICD-10-CM | POA: Diagnosis not present

## 2016-01-31 DIAGNOSIS — R2681 Unsteadiness on feet: Secondary | ICD-10-CM | POA: Diagnosis not present

## 2016-01-31 DIAGNOSIS — D649 Anemia, unspecified: Secondary | ICD-10-CM | POA: Diagnosis not present

## 2016-01-31 DIAGNOSIS — M6281 Muscle weakness (generalized): Secondary | ICD-10-CM | POA: Diagnosis not present

## 2016-01-31 DIAGNOSIS — R69 Illness, unspecified: Secondary | ICD-10-CM | POA: Diagnosis not present

## 2016-01-31 DIAGNOSIS — I10 Essential (primary) hypertension: Secondary | ICD-10-CM | POA: Diagnosis not present

## 2016-02-06 ENCOUNTER — Other Ambulatory Visit: Payer: Self-pay | Admitting: Family Medicine

## 2016-02-06 DIAGNOSIS — I10 Essential (primary) hypertension: Secondary | ICD-10-CM

## 2016-02-06 DIAGNOSIS — E119 Type 2 diabetes mellitus without complications: Secondary | ICD-10-CM

## 2016-02-08 DIAGNOSIS — E119 Type 2 diabetes mellitus without complications: Secondary | ICD-10-CM | POA: Diagnosis not present

## 2016-02-08 DIAGNOSIS — M6281 Muscle weakness (generalized): Secondary | ICD-10-CM | POA: Diagnosis not present

## 2016-02-08 DIAGNOSIS — M869 Osteomyelitis, unspecified: Secondary | ICD-10-CM | POA: Diagnosis not present

## 2016-02-08 DIAGNOSIS — Z89519 Acquired absence of unspecified leg below knee: Secondary | ICD-10-CM | POA: Diagnosis not present

## 2016-02-08 DIAGNOSIS — R262 Difficulty in walking, not elsewhere classified: Secondary | ICD-10-CM | POA: Diagnosis not present

## 2016-02-08 DIAGNOSIS — Z89512 Acquired absence of left leg below knee: Secondary | ICD-10-CM | POA: Diagnosis not present

## 2016-02-08 DIAGNOSIS — I10 Essential (primary) hypertension: Secondary | ICD-10-CM | POA: Diagnosis not present

## 2016-02-08 DIAGNOSIS — Z9181 History of falling: Secondary | ICD-10-CM | POA: Diagnosis not present

## 2016-02-08 DIAGNOSIS — R531 Weakness: Secondary | ICD-10-CM | POA: Diagnosis not present

## 2016-02-08 DIAGNOSIS — E11649 Type 2 diabetes mellitus with hypoglycemia without coma: Secondary | ICD-10-CM | POA: Diagnosis not present

## 2016-02-08 DIAGNOSIS — R2681 Unsteadiness on feet: Secondary | ICD-10-CM | POA: Diagnosis not present

## 2016-02-08 DIAGNOSIS — Z794 Long term (current) use of insulin: Secondary | ICD-10-CM | POA: Diagnosis not present

## 2016-02-08 DIAGNOSIS — D649 Anemia, unspecified: Secondary | ICD-10-CM | POA: Diagnosis not present

## 2016-02-08 DIAGNOSIS — Z89511 Acquired absence of right leg below knee: Secondary | ICD-10-CM | POA: Diagnosis not present

## 2016-02-08 DIAGNOSIS — Z741 Need for assistance with personal care: Secondary | ICD-10-CM | POA: Diagnosis not present

## 2016-02-08 DIAGNOSIS — I739 Peripheral vascular disease, unspecified: Secondary | ICD-10-CM | POA: Diagnosis not present

## 2016-02-12 DIAGNOSIS — I10 Essential (primary) hypertension: Secondary | ICD-10-CM | POA: Diagnosis not present

## 2016-02-12 DIAGNOSIS — I739 Peripheral vascular disease, unspecified: Secondary | ICD-10-CM | POA: Diagnosis not present

## 2016-02-12 DIAGNOSIS — M869 Osteomyelitis, unspecified: Secondary | ICD-10-CM | POA: Diagnosis not present

## 2016-02-12 DIAGNOSIS — Z89511 Acquired absence of right leg below knee: Secondary | ICD-10-CM | POA: Diagnosis not present

## 2016-02-12 DIAGNOSIS — E119 Type 2 diabetes mellitus without complications: Secondary | ICD-10-CM | POA: Diagnosis not present

## 2016-02-19 ENCOUNTER — Ambulatory Visit: Payer: Self-pay | Admitting: Family Medicine

## 2016-02-23 DIAGNOSIS — I739 Peripheral vascular disease, unspecified: Secondary | ICD-10-CM | POA: Diagnosis not present

## 2016-02-23 DIAGNOSIS — Z89512 Acquired absence of left leg below knee: Secondary | ICD-10-CM | POA: Diagnosis not present

## 2016-02-23 DIAGNOSIS — E119 Type 2 diabetes mellitus without complications: Secondary | ICD-10-CM | POA: Diagnosis not present

## 2016-02-25 DIAGNOSIS — Z89512 Acquired absence of left leg below knee: Secondary | ICD-10-CM | POA: Insufficient documentation

## 2016-03-11 DIAGNOSIS — D649 Anemia, unspecified: Secondary | ICD-10-CM | POA: Diagnosis not present

## 2016-03-11 DIAGNOSIS — I129 Hypertensive chronic kidney disease with stage 1 through stage 4 chronic kidney disease, or unspecified chronic kidney disease: Secondary | ICD-10-CM | POA: Diagnosis not present

## 2016-03-11 DIAGNOSIS — N189 Chronic kidney disease, unspecified: Secondary | ICD-10-CM | POA: Diagnosis not present

## 2016-03-11 DIAGNOSIS — Z7984 Long term (current) use of oral hypoglycemic drugs: Secondary | ICD-10-CM | POA: Diagnosis not present

## 2016-03-11 DIAGNOSIS — E1151 Type 2 diabetes mellitus with diabetic peripheral angiopathy without gangrene: Secondary | ICD-10-CM | POA: Diagnosis not present

## 2016-03-11 DIAGNOSIS — Z89511 Acquired absence of right leg below knee: Secondary | ICD-10-CM | POA: Diagnosis not present

## 2016-03-11 DIAGNOSIS — E1122 Type 2 diabetes mellitus with diabetic chronic kidney disease: Secondary | ICD-10-CM | POA: Diagnosis not present

## 2016-03-11 DIAGNOSIS — Z4781 Encounter for orthopedic aftercare following surgical amputation: Secondary | ICD-10-CM | POA: Diagnosis not present

## 2016-03-11 DIAGNOSIS — Z9181 History of falling: Secondary | ICD-10-CM | POA: Diagnosis not present

## 2016-03-14 DIAGNOSIS — D649 Anemia, unspecified: Secondary | ICD-10-CM | POA: Diagnosis not present

## 2016-03-14 DIAGNOSIS — Z89511 Acquired absence of right leg below knee: Secondary | ICD-10-CM | POA: Diagnosis not present

## 2016-03-14 DIAGNOSIS — I129 Hypertensive chronic kidney disease with stage 1 through stage 4 chronic kidney disease, or unspecified chronic kidney disease: Secondary | ICD-10-CM | POA: Diagnosis not present

## 2016-03-14 DIAGNOSIS — E1151 Type 2 diabetes mellitus with diabetic peripheral angiopathy without gangrene: Secondary | ICD-10-CM | POA: Diagnosis not present

## 2016-03-14 DIAGNOSIS — Z9181 History of falling: Secondary | ICD-10-CM | POA: Diagnosis not present

## 2016-03-14 DIAGNOSIS — E1122 Type 2 diabetes mellitus with diabetic chronic kidney disease: Secondary | ICD-10-CM | POA: Diagnosis not present

## 2016-03-14 DIAGNOSIS — Z4781 Encounter for orthopedic aftercare following surgical amputation: Secondary | ICD-10-CM | POA: Diagnosis not present

## 2016-03-14 DIAGNOSIS — Z7984 Long term (current) use of oral hypoglycemic drugs: Secondary | ICD-10-CM | POA: Diagnosis not present

## 2016-03-14 DIAGNOSIS — N189 Chronic kidney disease, unspecified: Secondary | ICD-10-CM | POA: Diagnosis not present

## 2016-03-15 DIAGNOSIS — Z9181 History of falling: Secondary | ICD-10-CM | POA: Diagnosis not present

## 2016-03-15 DIAGNOSIS — M6281 Muscle weakness (generalized): Secondary | ICD-10-CM | POA: Diagnosis not present

## 2016-03-15 DIAGNOSIS — R262 Difficulty in walking, not elsewhere classified: Secondary | ICD-10-CM | POA: Diagnosis not present

## 2016-03-15 DIAGNOSIS — D649 Anemia, unspecified: Secondary | ICD-10-CM | POA: Diagnosis not present

## 2016-03-15 DIAGNOSIS — N189 Chronic kidney disease, unspecified: Secondary | ICD-10-CM | POA: Diagnosis not present

## 2016-03-15 DIAGNOSIS — Z7984 Long term (current) use of oral hypoglycemic drugs: Secondary | ICD-10-CM | POA: Diagnosis not present

## 2016-03-15 DIAGNOSIS — E119 Type 2 diabetes mellitus without complications: Secondary | ICD-10-CM | POA: Diagnosis not present

## 2016-03-15 DIAGNOSIS — E1122 Type 2 diabetes mellitus with diabetic chronic kidney disease: Secondary | ICD-10-CM | POA: Diagnosis not present

## 2016-03-15 DIAGNOSIS — I739 Peripheral vascular disease, unspecified: Secondary | ICD-10-CM | POA: Diagnosis not present

## 2016-03-15 DIAGNOSIS — I129 Hypertensive chronic kidney disease with stage 1 through stage 4 chronic kidney disease, or unspecified chronic kidney disease: Secondary | ICD-10-CM | POA: Diagnosis not present

## 2016-03-15 DIAGNOSIS — Z4781 Encounter for orthopedic aftercare following surgical amputation: Secondary | ICD-10-CM | POA: Diagnosis not present

## 2016-03-15 DIAGNOSIS — E1151 Type 2 diabetes mellitus with diabetic peripheral angiopathy without gangrene: Secondary | ICD-10-CM | POA: Diagnosis not present

## 2016-03-15 DIAGNOSIS — Z89511 Acquired absence of right leg below knee: Secondary | ICD-10-CM | POA: Diagnosis not present

## 2016-03-15 DIAGNOSIS — T8189XA Other complications of procedures, not elsewhere classified, initial encounter: Secondary | ICD-10-CM | POA: Diagnosis not present

## 2016-03-16 DIAGNOSIS — Z9181 History of falling: Secondary | ICD-10-CM | POA: Diagnosis not present

## 2016-03-16 DIAGNOSIS — Z7984 Long term (current) use of oral hypoglycemic drugs: Secondary | ICD-10-CM | POA: Diagnosis not present

## 2016-03-16 DIAGNOSIS — Z4781 Encounter for orthopedic aftercare following surgical amputation: Secondary | ICD-10-CM | POA: Diagnosis not present

## 2016-03-16 DIAGNOSIS — E1151 Type 2 diabetes mellitus with diabetic peripheral angiopathy without gangrene: Secondary | ICD-10-CM | POA: Diagnosis not present

## 2016-03-16 DIAGNOSIS — E1122 Type 2 diabetes mellitus with diabetic chronic kidney disease: Secondary | ICD-10-CM | POA: Diagnosis not present

## 2016-03-16 DIAGNOSIS — D649 Anemia, unspecified: Secondary | ICD-10-CM | POA: Diagnosis not present

## 2016-03-16 DIAGNOSIS — Z89511 Acquired absence of right leg below knee: Secondary | ICD-10-CM | POA: Diagnosis not present

## 2016-03-16 DIAGNOSIS — I129 Hypertensive chronic kidney disease with stage 1 through stage 4 chronic kidney disease, or unspecified chronic kidney disease: Secondary | ICD-10-CM | POA: Diagnosis not present

## 2016-03-16 DIAGNOSIS — N189 Chronic kidney disease, unspecified: Secondary | ICD-10-CM | POA: Diagnosis not present

## 2016-03-18 DIAGNOSIS — D649 Anemia, unspecified: Secondary | ICD-10-CM | POA: Diagnosis not present

## 2016-03-18 DIAGNOSIS — E1151 Type 2 diabetes mellitus with diabetic peripheral angiopathy without gangrene: Secondary | ICD-10-CM | POA: Diagnosis not present

## 2016-03-18 DIAGNOSIS — Z9181 History of falling: Secondary | ICD-10-CM | POA: Diagnosis not present

## 2016-03-18 DIAGNOSIS — Z89511 Acquired absence of right leg below knee: Secondary | ICD-10-CM | POA: Diagnosis not present

## 2016-03-18 DIAGNOSIS — Z7984 Long term (current) use of oral hypoglycemic drugs: Secondary | ICD-10-CM | POA: Diagnosis not present

## 2016-03-18 DIAGNOSIS — E1122 Type 2 diabetes mellitus with diabetic chronic kidney disease: Secondary | ICD-10-CM | POA: Diagnosis not present

## 2016-03-18 DIAGNOSIS — Z4781 Encounter for orthopedic aftercare following surgical amputation: Secondary | ICD-10-CM | POA: Diagnosis not present

## 2016-03-18 DIAGNOSIS — N189 Chronic kidney disease, unspecified: Secondary | ICD-10-CM | POA: Diagnosis not present

## 2016-03-18 DIAGNOSIS — I129 Hypertensive chronic kidney disease with stage 1 through stage 4 chronic kidney disease, or unspecified chronic kidney disease: Secondary | ICD-10-CM | POA: Diagnosis not present

## 2016-03-21 DIAGNOSIS — Z9181 History of falling: Secondary | ICD-10-CM | POA: Diagnosis not present

## 2016-03-21 DIAGNOSIS — D649 Anemia, unspecified: Secondary | ICD-10-CM | POA: Diagnosis not present

## 2016-03-21 DIAGNOSIS — Z4781 Encounter for orthopedic aftercare following surgical amputation: Secondary | ICD-10-CM | POA: Diagnosis not present

## 2016-03-21 DIAGNOSIS — Z89511 Acquired absence of right leg below knee: Secondary | ICD-10-CM | POA: Diagnosis not present

## 2016-03-21 DIAGNOSIS — E1122 Type 2 diabetes mellitus with diabetic chronic kidney disease: Secondary | ICD-10-CM | POA: Diagnosis not present

## 2016-03-21 DIAGNOSIS — N189 Chronic kidney disease, unspecified: Secondary | ICD-10-CM | POA: Diagnosis not present

## 2016-03-21 DIAGNOSIS — E1151 Type 2 diabetes mellitus with diabetic peripheral angiopathy without gangrene: Secondary | ICD-10-CM | POA: Diagnosis not present

## 2016-03-21 DIAGNOSIS — Z7984 Long term (current) use of oral hypoglycemic drugs: Secondary | ICD-10-CM | POA: Diagnosis not present

## 2016-03-21 DIAGNOSIS — I129 Hypertensive chronic kidney disease with stage 1 through stage 4 chronic kidney disease, or unspecified chronic kidney disease: Secondary | ICD-10-CM | POA: Diagnosis not present

## 2016-03-22 DIAGNOSIS — I129 Hypertensive chronic kidney disease with stage 1 through stage 4 chronic kidney disease, or unspecified chronic kidney disease: Secondary | ICD-10-CM | POA: Diagnosis not present

## 2016-03-22 DIAGNOSIS — Z7984 Long term (current) use of oral hypoglycemic drugs: Secondary | ICD-10-CM | POA: Diagnosis not present

## 2016-03-22 DIAGNOSIS — E1122 Type 2 diabetes mellitus with diabetic chronic kidney disease: Secondary | ICD-10-CM | POA: Diagnosis not present

## 2016-03-22 DIAGNOSIS — Z89511 Acquired absence of right leg below knee: Secondary | ICD-10-CM | POA: Diagnosis not present

## 2016-03-22 DIAGNOSIS — N189 Chronic kidney disease, unspecified: Secondary | ICD-10-CM | POA: Diagnosis not present

## 2016-03-22 DIAGNOSIS — Z9181 History of falling: Secondary | ICD-10-CM | POA: Diagnosis not present

## 2016-03-22 DIAGNOSIS — Z4781 Encounter for orthopedic aftercare following surgical amputation: Secondary | ICD-10-CM | POA: Diagnosis not present

## 2016-03-22 DIAGNOSIS — E1151 Type 2 diabetes mellitus with diabetic peripheral angiopathy without gangrene: Secondary | ICD-10-CM | POA: Diagnosis not present

## 2016-03-22 DIAGNOSIS — D649 Anemia, unspecified: Secondary | ICD-10-CM | POA: Diagnosis not present

## 2016-03-23 DIAGNOSIS — I129 Hypertensive chronic kidney disease with stage 1 through stage 4 chronic kidney disease, or unspecified chronic kidney disease: Secondary | ICD-10-CM | POA: Diagnosis not present

## 2016-03-23 DIAGNOSIS — Z4781 Encounter for orthopedic aftercare following surgical amputation: Secondary | ICD-10-CM | POA: Diagnosis not present

## 2016-03-23 DIAGNOSIS — Z9181 History of falling: Secondary | ICD-10-CM | POA: Diagnosis not present

## 2016-03-23 DIAGNOSIS — N189 Chronic kidney disease, unspecified: Secondary | ICD-10-CM | POA: Diagnosis not present

## 2016-03-23 DIAGNOSIS — Z89511 Acquired absence of right leg below knee: Secondary | ICD-10-CM | POA: Diagnosis not present

## 2016-03-23 DIAGNOSIS — E1151 Type 2 diabetes mellitus with diabetic peripheral angiopathy without gangrene: Secondary | ICD-10-CM | POA: Diagnosis not present

## 2016-03-23 DIAGNOSIS — Z7984 Long term (current) use of oral hypoglycemic drugs: Secondary | ICD-10-CM | POA: Diagnosis not present

## 2016-03-23 DIAGNOSIS — D649 Anemia, unspecified: Secondary | ICD-10-CM | POA: Diagnosis not present

## 2016-03-23 DIAGNOSIS — E1122 Type 2 diabetes mellitus with diabetic chronic kidney disease: Secondary | ICD-10-CM | POA: Diagnosis not present

## 2016-03-25 DIAGNOSIS — Z4781 Encounter for orthopedic aftercare following surgical amputation: Secondary | ICD-10-CM | POA: Diagnosis not present

## 2016-03-25 DIAGNOSIS — D649 Anemia, unspecified: Secondary | ICD-10-CM | POA: Diagnosis not present

## 2016-03-25 DIAGNOSIS — Z89511 Acquired absence of right leg below knee: Secondary | ICD-10-CM | POA: Diagnosis not present

## 2016-03-25 DIAGNOSIS — Z9181 History of falling: Secondary | ICD-10-CM | POA: Diagnosis not present

## 2016-03-25 DIAGNOSIS — Z7984 Long term (current) use of oral hypoglycemic drugs: Secondary | ICD-10-CM | POA: Diagnosis not present

## 2016-03-25 DIAGNOSIS — E1151 Type 2 diabetes mellitus with diabetic peripheral angiopathy without gangrene: Secondary | ICD-10-CM | POA: Diagnosis not present

## 2016-03-25 DIAGNOSIS — I129 Hypertensive chronic kidney disease with stage 1 through stage 4 chronic kidney disease, or unspecified chronic kidney disease: Secondary | ICD-10-CM | POA: Diagnosis not present

## 2016-03-25 DIAGNOSIS — N189 Chronic kidney disease, unspecified: Secondary | ICD-10-CM | POA: Diagnosis not present

## 2016-03-25 DIAGNOSIS — T8189XA Other complications of procedures, not elsewhere classified, initial encounter: Secondary | ICD-10-CM | POA: Diagnosis not present

## 2016-03-25 DIAGNOSIS — E1122 Type 2 diabetes mellitus with diabetic chronic kidney disease: Secondary | ICD-10-CM | POA: Diagnosis not present

## 2016-03-28 DIAGNOSIS — D649 Anemia, unspecified: Secondary | ICD-10-CM | POA: Diagnosis not present

## 2016-03-28 DIAGNOSIS — Z89511 Acquired absence of right leg below knee: Secondary | ICD-10-CM | POA: Diagnosis not present

## 2016-03-28 DIAGNOSIS — Z9181 History of falling: Secondary | ICD-10-CM | POA: Diagnosis not present

## 2016-03-28 DIAGNOSIS — I129 Hypertensive chronic kidney disease with stage 1 through stage 4 chronic kidney disease, or unspecified chronic kidney disease: Secondary | ICD-10-CM | POA: Diagnosis not present

## 2016-03-28 DIAGNOSIS — Z4781 Encounter for orthopedic aftercare following surgical amputation: Secondary | ICD-10-CM | POA: Diagnosis not present

## 2016-03-28 DIAGNOSIS — Z7984 Long term (current) use of oral hypoglycemic drugs: Secondary | ICD-10-CM | POA: Diagnosis not present

## 2016-03-28 DIAGNOSIS — E1151 Type 2 diabetes mellitus with diabetic peripheral angiopathy without gangrene: Secondary | ICD-10-CM | POA: Diagnosis not present

## 2016-03-28 DIAGNOSIS — E1122 Type 2 diabetes mellitus with diabetic chronic kidney disease: Secondary | ICD-10-CM | POA: Diagnosis not present

## 2016-03-28 DIAGNOSIS — N189 Chronic kidney disease, unspecified: Secondary | ICD-10-CM | POA: Diagnosis not present

## 2016-03-29 DIAGNOSIS — E1151 Type 2 diabetes mellitus with diabetic peripheral angiopathy without gangrene: Secondary | ICD-10-CM | POA: Diagnosis not present

## 2016-03-29 DIAGNOSIS — Z7984 Long term (current) use of oral hypoglycemic drugs: Secondary | ICD-10-CM | POA: Diagnosis not present

## 2016-03-29 DIAGNOSIS — D649 Anemia, unspecified: Secondary | ICD-10-CM | POA: Diagnosis not present

## 2016-03-29 DIAGNOSIS — E1122 Type 2 diabetes mellitus with diabetic chronic kidney disease: Secondary | ICD-10-CM | POA: Diagnosis not present

## 2016-03-29 DIAGNOSIS — Z89511 Acquired absence of right leg below knee: Secondary | ICD-10-CM | POA: Diagnosis not present

## 2016-03-29 DIAGNOSIS — I129 Hypertensive chronic kidney disease with stage 1 through stage 4 chronic kidney disease, or unspecified chronic kidney disease: Secondary | ICD-10-CM | POA: Diagnosis not present

## 2016-03-29 DIAGNOSIS — N189 Chronic kidney disease, unspecified: Secondary | ICD-10-CM | POA: Diagnosis not present

## 2016-03-29 DIAGNOSIS — Z9181 History of falling: Secondary | ICD-10-CM | POA: Diagnosis not present

## 2016-03-29 DIAGNOSIS — Z4781 Encounter for orthopedic aftercare following surgical amputation: Secondary | ICD-10-CM | POA: Diagnosis not present

## 2016-03-30 DIAGNOSIS — Z89511 Acquired absence of right leg below knee: Secondary | ICD-10-CM | POA: Diagnosis not present

## 2016-03-30 DIAGNOSIS — Z7984 Long term (current) use of oral hypoglycemic drugs: Secondary | ICD-10-CM | POA: Diagnosis not present

## 2016-03-30 DIAGNOSIS — I129 Hypertensive chronic kidney disease with stage 1 through stage 4 chronic kidney disease, or unspecified chronic kidney disease: Secondary | ICD-10-CM | POA: Diagnosis not present

## 2016-03-30 DIAGNOSIS — Z4781 Encounter for orthopedic aftercare following surgical amputation: Secondary | ICD-10-CM | POA: Diagnosis not present

## 2016-03-30 DIAGNOSIS — N189 Chronic kidney disease, unspecified: Secondary | ICD-10-CM | POA: Diagnosis not present

## 2016-03-30 DIAGNOSIS — D649 Anemia, unspecified: Secondary | ICD-10-CM | POA: Diagnosis not present

## 2016-03-30 DIAGNOSIS — E1122 Type 2 diabetes mellitus with diabetic chronic kidney disease: Secondary | ICD-10-CM | POA: Diagnosis not present

## 2016-03-30 DIAGNOSIS — E1151 Type 2 diabetes mellitus with diabetic peripheral angiopathy without gangrene: Secondary | ICD-10-CM | POA: Diagnosis not present

## 2016-03-30 DIAGNOSIS — Z9181 History of falling: Secondary | ICD-10-CM | POA: Diagnosis not present

## 2016-04-01 DIAGNOSIS — Z7984 Long term (current) use of oral hypoglycemic drugs: Secondary | ICD-10-CM | POA: Diagnosis not present

## 2016-04-01 DIAGNOSIS — Z9181 History of falling: Secondary | ICD-10-CM | POA: Diagnosis not present

## 2016-04-01 DIAGNOSIS — E1151 Type 2 diabetes mellitus with diabetic peripheral angiopathy without gangrene: Secondary | ICD-10-CM | POA: Diagnosis not present

## 2016-04-01 DIAGNOSIS — Z89511 Acquired absence of right leg below knee: Secondary | ICD-10-CM | POA: Diagnosis not present

## 2016-04-01 DIAGNOSIS — I129 Hypertensive chronic kidney disease with stage 1 through stage 4 chronic kidney disease, or unspecified chronic kidney disease: Secondary | ICD-10-CM | POA: Diagnosis not present

## 2016-04-01 DIAGNOSIS — E1122 Type 2 diabetes mellitus with diabetic chronic kidney disease: Secondary | ICD-10-CM | POA: Diagnosis not present

## 2016-04-01 DIAGNOSIS — N189 Chronic kidney disease, unspecified: Secondary | ICD-10-CM | POA: Diagnosis not present

## 2016-04-01 DIAGNOSIS — Z4781 Encounter for orthopedic aftercare following surgical amputation: Secondary | ICD-10-CM | POA: Diagnosis not present

## 2016-04-01 DIAGNOSIS — D649 Anemia, unspecified: Secondary | ICD-10-CM | POA: Diagnosis not present

## 2016-04-04 DIAGNOSIS — Z4781 Encounter for orthopedic aftercare following surgical amputation: Secondary | ICD-10-CM | POA: Diagnosis not present

## 2016-04-04 DIAGNOSIS — E1122 Type 2 diabetes mellitus with diabetic chronic kidney disease: Secondary | ICD-10-CM | POA: Diagnosis not present

## 2016-04-04 DIAGNOSIS — Z7984 Long term (current) use of oral hypoglycemic drugs: Secondary | ICD-10-CM | POA: Diagnosis not present

## 2016-04-04 DIAGNOSIS — Z89511 Acquired absence of right leg below knee: Secondary | ICD-10-CM | POA: Diagnosis not present

## 2016-04-04 DIAGNOSIS — E1151 Type 2 diabetes mellitus with diabetic peripheral angiopathy without gangrene: Secondary | ICD-10-CM | POA: Diagnosis not present

## 2016-04-04 DIAGNOSIS — Z9181 History of falling: Secondary | ICD-10-CM | POA: Diagnosis not present

## 2016-04-04 DIAGNOSIS — D649 Anemia, unspecified: Secondary | ICD-10-CM | POA: Diagnosis not present

## 2016-04-04 DIAGNOSIS — N189 Chronic kidney disease, unspecified: Secondary | ICD-10-CM | POA: Diagnosis not present

## 2016-04-04 DIAGNOSIS — I129 Hypertensive chronic kidney disease with stage 1 through stage 4 chronic kidney disease, or unspecified chronic kidney disease: Secondary | ICD-10-CM | POA: Diagnosis not present

## 2016-04-05 DIAGNOSIS — Z89619 Acquired absence of unspecified leg above knee: Secondary | ICD-10-CM | POA: Diagnosis not present

## 2016-04-06 DIAGNOSIS — I129 Hypertensive chronic kidney disease with stage 1 through stage 4 chronic kidney disease, or unspecified chronic kidney disease: Secondary | ICD-10-CM | POA: Diagnosis not present

## 2016-04-06 DIAGNOSIS — Z4781 Encounter for orthopedic aftercare following surgical amputation: Secondary | ICD-10-CM | POA: Diagnosis not present

## 2016-04-06 DIAGNOSIS — Z9181 History of falling: Secondary | ICD-10-CM | POA: Diagnosis not present

## 2016-04-06 DIAGNOSIS — E1122 Type 2 diabetes mellitus with diabetic chronic kidney disease: Secondary | ICD-10-CM | POA: Diagnosis not present

## 2016-04-06 DIAGNOSIS — Z89511 Acquired absence of right leg below knee: Secondary | ICD-10-CM | POA: Diagnosis not present

## 2016-04-06 DIAGNOSIS — Z7984 Long term (current) use of oral hypoglycemic drugs: Secondary | ICD-10-CM | POA: Diagnosis not present

## 2016-04-06 DIAGNOSIS — D649 Anemia, unspecified: Secondary | ICD-10-CM | POA: Diagnosis not present

## 2016-04-06 DIAGNOSIS — E1151 Type 2 diabetes mellitus with diabetic peripheral angiopathy without gangrene: Secondary | ICD-10-CM | POA: Diagnosis not present

## 2016-04-06 DIAGNOSIS — N189 Chronic kidney disease, unspecified: Secondary | ICD-10-CM | POA: Diagnosis not present

## 2016-04-08 DIAGNOSIS — E1151 Type 2 diabetes mellitus with diabetic peripheral angiopathy without gangrene: Secondary | ICD-10-CM | POA: Diagnosis not present

## 2016-04-08 DIAGNOSIS — D649 Anemia, unspecified: Secondary | ICD-10-CM | POA: Diagnosis not present

## 2016-04-08 DIAGNOSIS — Z4781 Encounter for orthopedic aftercare following surgical amputation: Secondary | ICD-10-CM | POA: Diagnosis not present

## 2016-04-08 DIAGNOSIS — Z7984 Long term (current) use of oral hypoglycemic drugs: Secondary | ICD-10-CM | POA: Diagnosis not present

## 2016-04-08 DIAGNOSIS — E1122 Type 2 diabetes mellitus with diabetic chronic kidney disease: Secondary | ICD-10-CM | POA: Diagnosis not present

## 2016-04-08 DIAGNOSIS — Z9181 History of falling: Secondary | ICD-10-CM | POA: Diagnosis not present

## 2016-04-08 DIAGNOSIS — Z89511 Acquired absence of right leg below knee: Secondary | ICD-10-CM | POA: Diagnosis not present

## 2016-04-08 DIAGNOSIS — N189 Chronic kidney disease, unspecified: Secondary | ICD-10-CM | POA: Diagnosis not present

## 2016-04-08 DIAGNOSIS — I129 Hypertensive chronic kidney disease with stage 1 through stage 4 chronic kidney disease, or unspecified chronic kidney disease: Secondary | ICD-10-CM | POA: Diagnosis not present

## 2016-04-11 DIAGNOSIS — Z7984 Long term (current) use of oral hypoglycemic drugs: Secondary | ICD-10-CM | POA: Diagnosis not present

## 2016-04-11 DIAGNOSIS — E1151 Type 2 diabetes mellitus with diabetic peripheral angiopathy without gangrene: Secondary | ICD-10-CM | POA: Diagnosis not present

## 2016-04-11 DIAGNOSIS — D649 Anemia, unspecified: Secondary | ICD-10-CM | POA: Diagnosis not present

## 2016-04-11 DIAGNOSIS — Z9181 History of falling: Secondary | ICD-10-CM | POA: Diagnosis not present

## 2016-04-11 DIAGNOSIS — Z89511 Acquired absence of right leg below knee: Secondary | ICD-10-CM | POA: Diagnosis not present

## 2016-04-11 DIAGNOSIS — Z4781 Encounter for orthopedic aftercare following surgical amputation: Secondary | ICD-10-CM | POA: Diagnosis not present

## 2016-04-11 DIAGNOSIS — I129 Hypertensive chronic kidney disease with stage 1 through stage 4 chronic kidney disease, or unspecified chronic kidney disease: Secondary | ICD-10-CM | POA: Diagnosis not present

## 2016-04-11 DIAGNOSIS — E1122 Type 2 diabetes mellitus with diabetic chronic kidney disease: Secondary | ICD-10-CM | POA: Diagnosis not present

## 2016-04-11 DIAGNOSIS — N189 Chronic kidney disease, unspecified: Secondary | ICD-10-CM | POA: Diagnosis not present

## 2016-04-12 ENCOUNTER — Encounter: Payer: Self-pay | Admitting: Family Medicine

## 2016-04-12 ENCOUNTER — Ambulatory Visit (INDEPENDENT_AMBULATORY_CARE_PROVIDER_SITE_OTHER): Payer: Medicare Other | Admitting: Family Medicine

## 2016-04-12 VITALS — BP 138/80 | HR 80 | Ht 74.0 in | Wt 155.0 lb

## 2016-04-12 DIAGNOSIS — I129 Hypertensive chronic kidney disease with stage 1 through stage 4 chronic kidney disease, or unspecified chronic kidney disease: Secondary | ICD-10-CM | POA: Diagnosis not present

## 2016-04-12 DIAGNOSIS — D649 Anemia, unspecified: Secondary | ICD-10-CM | POA: Diagnosis not present

## 2016-04-12 DIAGNOSIS — J309 Allergic rhinitis, unspecified: Secondary | ICD-10-CM

## 2016-04-12 DIAGNOSIS — N189 Chronic kidney disease, unspecified: Secondary | ICD-10-CM | POA: Diagnosis not present

## 2016-04-12 DIAGNOSIS — R69 Illness, unspecified: Secondary | ICD-10-CM | POA: Diagnosis not present

## 2016-04-12 DIAGNOSIS — K219 Gastro-esophageal reflux disease without esophagitis: Secondary | ICD-10-CM

## 2016-04-12 DIAGNOSIS — L309 Dermatitis, unspecified: Secondary | ICD-10-CM

## 2016-04-12 DIAGNOSIS — E1151 Type 2 diabetes mellitus with diabetic peripheral angiopathy without gangrene: Secondary | ICD-10-CM | POA: Diagnosis not present

## 2016-04-12 DIAGNOSIS — Z7984 Long term (current) use of oral hypoglycemic drugs: Secondary | ICD-10-CM | POA: Diagnosis not present

## 2016-04-12 DIAGNOSIS — E1122 Type 2 diabetes mellitus with diabetic chronic kidney disease: Secondary | ICD-10-CM | POA: Diagnosis not present

## 2016-04-12 DIAGNOSIS — Z4781 Encounter for orthopedic aftercare following surgical amputation: Secondary | ICD-10-CM | POA: Diagnosis not present

## 2016-04-12 DIAGNOSIS — Z89511 Acquired absence of right leg below knee: Secondary | ICD-10-CM | POA: Diagnosis not present

## 2016-04-12 DIAGNOSIS — Z9181 History of falling: Secondary | ICD-10-CM | POA: Diagnosis not present

## 2016-04-12 MED ORDER — RANITIDINE HCL 300 MG PO CAPS: 300.0000 mg | ORAL_CAPSULE | Freq: Every evening | ORAL | 11 refills | Status: DC

## 2016-04-12 MED ORDER — LORATADINE 10 MG PO TABS: 10.0000 mg | ORAL_TABLET | Freq: Every day | ORAL | 11 refills | Status: DC

## 2016-04-12 NOTE — Progress Notes (Signed)
Name: Rogelia RohrerKelep R Petrilla   MRN: [id number]    DOB: [birth year]   Date:04/12/2016       Progress Note  Subjective  Chief Complaint  Chief Complaint  Patient presents with  . Rash    skin is itching all over- no noticeable rash    Rash  This is a new problem. The current episode started 1 to 4 weeks ago. The problem has been gradually improving since onset. The affected locations include the torso, back, left upper leg, right upper leg, chest, neck and abdomen. He was exposed to nothing. Pertinent negatives include no anorexia, congestion, cough, diarrhea, eye pain, facial edema, fatigue, fever, joint pain, nail changes, rhinorrhea, shortness of breath, sore throat or vomiting. Past treatments include moisturizer. The treatment provided no relief. There is no history of allergies, asthma, eczema or varicella.    No problem-specific Assessment & Plan notes found for this encounter.   Past Medical History:  Diagnosis Date  . Diabetes mellitus without complication (HCC)   . Hypertension   . Peripheral vascular disease [facility](HCC)     Past Surgical History:  Procedure Laterality Date  . AMPUTATION TOE Right 01/15/2016   Procedure: AMPUTATION TOE;  Surgeon: Gwyneth RevelsJustin Fowler, DPM;  Location: [facility] ORS;  Service: Podiatry;  Laterality: Right;  . PERIPHERAL VASCULAR CATHETERIZATION Right 01/07/2016   Procedure: Abdominal Aortogram w/Lower Extremity;  Surgeon: Renford DillsGregory G Schnier, MD;  Location: [facility] INVASIVE CV LAB;  Service: Cardiovascular;  Laterality: Right;  . TOE SURGERY     amputated 2 toes on L) foot    Family History  Problem Relation Age of Onset  . Diabetes Mother   . Hypertension Mother   . Diabetes Father   . Hypertension Father     Social History   Social History  . Marital status: Married    Spouse name: N/A  . Number of children: N/A  . Years of education: N/A   Occupational History  . Not on file.   Social History Main Topics  . Smoking status: Never Smoker  .  Smokeless tobacco: Never Used  . Alcohol use No  . Drug use: No  . Sexual activity: Not Currently   Other Topics Concern  . Not on file   Social History Narrative  . No narrative on file    No Known Allergies   Review of Systems  Constitutional: Negative for chills, fatigue, fever, malaise/fatigue and weight loss.  HENT: Negative for congestion, ear discharge, ear pain, rhinorrhea and sore throat.   Eyes: Negative for blurred vision and pain.  Respiratory: Negative for cough, sputum production, shortness of breath and wheezing.   Cardiovascular: Negative for chest pain, palpitations and leg swelling.  Gastrointestinal: Negative for abdominal pain, anorexia, blood in stool, constipation, diarrhea, heartburn, melena, nausea and vomiting.  Genitourinary: Negative for dysuria, frequency, hematuria and urgency.  Musculoskeletal: Negative for back pain, joint pain, myalgias and neck pain.  Skin: Positive for rash. Negative for nail changes.  Neurological: Negative for dizziness, tingling, sensory change, focal weakness and headaches.  Endo/Heme/Allergies: Negative for environmental allergies and polydipsia. Does not bruise/bleed easily.  Psychiatric/Behavioral: Negative for depression and suicidal ideas. The patient is not nervous/anxious and does not have insomnia.      Objective  Vitals:   04/12/16 1353  BP: 138/80  Pulse: 80  Weight: 155 lb (70.3 kg)  Height: 6\' 2"  (1.88 m)    Physical Exam  Constitutional: He is oriented to person, place, and time and well-developed, well-nourished, and in  no distress.  HENT:  Head: Normocephalic.  Right Ear: External ear normal.  Left Ear: External ear normal.  Nose: Nose normal.  Mouth/Throat: Oropharynx is clear and moist.  Eyes: Conjunctivae and EOM are normal. Pupils are equal, round, and reactive to light. Right eye exhibits no discharge. Left eye exhibits no discharge. No scleral icterus.  Neck: Normal range of motion. Neck  supple. No JVD present. No tracheal deviation present. No thyromegaly present.  Cardiovascular: Normal rate, regular rhythm, normal heart sounds and intact distal pulses.  Exam reveals no gallop and no friction rub.   No murmur heard. Pulmonary/Chest: Breath sounds normal. No respiratory distress. He has no wheezes. He has no rales.  Abdominal: Soft. Bowel sounds are normal. He exhibits no mass. There is no hepatosplenomegaly. There is no tenderness. There is no rebound, no guarding and no CVA tenderness.  Musculoskeletal: Normal range of motion. He exhibits no edema or tenderness.  Lymphadenopathy:    He has no cervical adenopathy.  Neurological: He is alert and oriented to person, place, and time. He has normal sensation, normal strength, normal reflexes and intact cranial nerves. No cranial nerve deficit.  Skin: Skin is warm. No rash noted.  Psychiatric: Mood and affect normal.  Nursing note and vitals reviewed.     Assessment & Plan  Problem List Items Addressed This Visit    None    Visit Diagnoses    Gastroesophageal reflux disease, esophagitis presence not specified    -  Primary   Relevant Medications   ranitidine (ZANTAC) 300 MG capsule   Acute allergic rhinitis, unspecified seasonality, unspecified trigger       Relevant Medications   loratadine (CLARITIN) 10 MG tablet   Eczema, unspecified type       Relevant Medications   loratadine (CLARITIN) 10 MG tablet   ranitidine (ZANTAC) 300 MG capsule   Taking medication for chronic disease       Relevant Orders   Hepatic Function Panel (6)        Dr. Hayden Rasmusseneanna [facility]  04/12/16

## 2016-04-13 ENCOUNTER — Other Ambulatory Visit: Payer: Self-pay

## 2016-04-13 DIAGNOSIS — E1122 Type 2 diabetes mellitus with diabetic chronic kidney disease: Secondary | ICD-10-CM | POA: Diagnosis not present

## 2016-04-13 DIAGNOSIS — Z89511 Acquired absence of right leg below knee: Secondary | ICD-10-CM | POA: Diagnosis not present

## 2016-04-13 DIAGNOSIS — E1151 Type 2 diabetes mellitus with diabetic peripheral angiopathy without gangrene: Secondary | ICD-10-CM | POA: Diagnosis not present

## 2016-04-13 DIAGNOSIS — Z4781 Encounter for orthopedic aftercare following surgical amputation: Secondary | ICD-10-CM | POA: Diagnosis not present

## 2016-04-13 DIAGNOSIS — Z7984 Long term (current) use of oral hypoglycemic drugs: Secondary | ICD-10-CM | POA: Diagnosis not present

## 2016-04-13 DIAGNOSIS — D649 Anemia, unspecified: Secondary | ICD-10-CM | POA: Diagnosis not present

## 2016-04-13 DIAGNOSIS — N189 Chronic kidney disease, unspecified: Secondary | ICD-10-CM | POA: Diagnosis not present

## 2016-04-13 DIAGNOSIS — I129 Hypertensive chronic kidney disease with stage 1 through stage 4 chronic kidney disease, or unspecified chronic kidney disease: Secondary | ICD-10-CM | POA: Diagnosis not present

## 2016-04-13 DIAGNOSIS — Z9181 History of falling: Secondary | ICD-10-CM | POA: Diagnosis not present

## 2016-04-13 LAB — HEPATIC FUNCTION PANEL (6)
ALT: 14 IU/L (ref 0–44)
AST: 18 IU/L (ref 0–40)
Albumin: 4.6 g/dL (ref 3.5–4.7)
Alkaline Phosphatase: 77 IU/L (ref 39–117)
Bilirubin Total: 0.3 mg/dL (ref 0.0–1.2)
Bilirubin, Direct: 0.1 mg/dL (ref 0.00–0.40)

## 2016-04-19 DIAGNOSIS — Z89511 Acquired absence of right leg below knee: Secondary | ICD-10-CM | POA: Diagnosis not present

## 2016-04-19 DIAGNOSIS — E1151 Type 2 diabetes mellitus with diabetic peripheral angiopathy without gangrene: Secondary | ICD-10-CM | POA: Diagnosis not present

## 2016-04-19 DIAGNOSIS — I129 Hypertensive chronic kidney disease with stage 1 through stage 4 chronic kidney disease, or unspecified chronic kidney disease: Secondary | ICD-10-CM | POA: Diagnosis not present

## 2016-04-19 DIAGNOSIS — Z7984 Long term (current) use of oral hypoglycemic drugs: Secondary | ICD-10-CM | POA: Diagnosis not present

## 2016-04-19 DIAGNOSIS — N189 Chronic kidney disease, unspecified: Secondary | ICD-10-CM | POA: Diagnosis not present

## 2016-04-19 DIAGNOSIS — Z9181 History of falling: Secondary | ICD-10-CM | POA: Diagnosis not present

## 2016-04-19 DIAGNOSIS — Z4781 Encounter for orthopedic aftercare following surgical amputation: Secondary | ICD-10-CM | POA: Diagnosis not present

## 2016-04-19 DIAGNOSIS — E1122 Type 2 diabetes mellitus with diabetic chronic kidney disease: Secondary | ICD-10-CM | POA: Diagnosis not present

## 2016-04-19 DIAGNOSIS — D649 Anemia, unspecified: Secondary | ICD-10-CM | POA: Diagnosis not present

## 2016-04-21 DIAGNOSIS — I129 Hypertensive chronic kidney disease with stage 1 through stage 4 chronic kidney disease, or unspecified chronic kidney disease: Secondary | ICD-10-CM | POA: Diagnosis not present

## 2016-04-21 DIAGNOSIS — E1122 Type 2 diabetes mellitus with diabetic chronic kidney disease: Secondary | ICD-10-CM | POA: Diagnosis not present

## 2016-04-21 DIAGNOSIS — D649 Anemia, unspecified: Secondary | ICD-10-CM | POA: Diagnosis not present

## 2016-04-21 DIAGNOSIS — Z7984 Long term (current) use of oral hypoglycemic drugs: Secondary | ICD-10-CM | POA: Diagnosis not present

## 2016-04-21 DIAGNOSIS — N189 Chronic kidney disease, unspecified: Secondary | ICD-10-CM | POA: Diagnosis not present

## 2016-04-21 DIAGNOSIS — Z9181 History of falling: Secondary | ICD-10-CM | POA: Diagnosis not present

## 2016-04-21 DIAGNOSIS — Z89511 Acquired absence of right leg below knee: Secondary | ICD-10-CM | POA: Diagnosis not present

## 2016-04-21 DIAGNOSIS — Z4781 Encounter for orthopedic aftercare following surgical amputation: Secondary | ICD-10-CM | POA: Diagnosis not present

## 2016-04-21 DIAGNOSIS — E1151 Type 2 diabetes mellitus with diabetic peripheral angiopathy without gangrene: Secondary | ICD-10-CM | POA: Diagnosis not present

## 2016-04-22 DIAGNOSIS — E1122 Type 2 diabetes mellitus with diabetic chronic kidney disease: Secondary | ICD-10-CM | POA: Diagnosis not present

## 2016-04-22 DIAGNOSIS — Z4781 Encounter for orthopedic aftercare following surgical amputation: Secondary | ICD-10-CM | POA: Diagnosis not present

## 2016-04-22 DIAGNOSIS — N189 Chronic kidney disease, unspecified: Secondary | ICD-10-CM | POA: Diagnosis not present

## 2016-04-22 DIAGNOSIS — D649 Anemia, unspecified: Secondary | ICD-10-CM | POA: Diagnosis not present

## 2016-04-22 DIAGNOSIS — Z9181 History of falling: Secondary | ICD-10-CM | POA: Diagnosis not present

## 2016-04-22 DIAGNOSIS — I129 Hypertensive chronic kidney disease with stage 1 through stage 4 chronic kidney disease, or unspecified chronic kidney disease: Secondary | ICD-10-CM | POA: Diagnosis not present

## 2016-04-22 DIAGNOSIS — Z89511 Acquired absence of right leg below knee: Secondary | ICD-10-CM | POA: Diagnosis not present

## 2016-04-22 DIAGNOSIS — E1151 Type 2 diabetes mellitus with diabetic peripheral angiopathy without gangrene: Secondary | ICD-10-CM | POA: Diagnosis not present

## 2016-04-22 DIAGNOSIS — Z7984 Long term (current) use of oral hypoglycemic drugs: Secondary | ICD-10-CM | POA: Diagnosis not present

## 2016-04-27 DIAGNOSIS — Z4781 Encounter for orthopedic aftercare following surgical amputation: Secondary | ICD-10-CM | POA: Diagnosis not present

## 2016-04-27 DIAGNOSIS — I129 Hypertensive chronic kidney disease with stage 1 through stage 4 chronic kidney disease, or unspecified chronic kidney disease: Secondary | ICD-10-CM | POA: Diagnosis not present

## 2016-04-27 DIAGNOSIS — E1151 Type 2 diabetes mellitus with diabetic peripheral angiopathy without gangrene: Secondary | ICD-10-CM | POA: Diagnosis not present

## 2016-04-27 DIAGNOSIS — E1122 Type 2 diabetes mellitus with diabetic chronic kidney disease: Secondary | ICD-10-CM | POA: Diagnosis not present

## 2016-04-27 DIAGNOSIS — N189 Chronic kidney disease, unspecified: Secondary | ICD-10-CM | POA: Diagnosis not present

## 2016-04-27 DIAGNOSIS — Z89511 Acquired absence of right leg below knee: Secondary | ICD-10-CM | POA: Diagnosis not present

## 2016-04-27 DIAGNOSIS — Z7984 Long term (current) use of oral hypoglycemic drugs: Secondary | ICD-10-CM | POA: Diagnosis not present

## 2016-04-27 DIAGNOSIS — D649 Anemia, unspecified: Secondary | ICD-10-CM | POA: Diagnosis not present

## 2016-04-27 DIAGNOSIS — Z9181 History of falling: Secondary | ICD-10-CM | POA: Diagnosis not present

## 2016-04-29 DIAGNOSIS — T8189XA Other complications of procedures, not elsewhere classified, initial encounter: Secondary | ICD-10-CM | POA: Diagnosis not present

## 2016-05-02 ENCOUNTER — Other Ambulatory Visit: Payer: Self-pay

## 2016-05-02 DIAGNOSIS — D649 Anemia, unspecified: Secondary | ICD-10-CM | POA: Diagnosis not present

## 2016-05-02 DIAGNOSIS — E1122 Type 2 diabetes mellitus with diabetic chronic kidney disease: Secondary | ICD-10-CM | POA: Diagnosis not present

## 2016-05-02 DIAGNOSIS — Z4781 Encounter for orthopedic aftercare following surgical amputation: Secondary | ICD-10-CM | POA: Diagnosis not present

## 2016-05-02 DIAGNOSIS — E1151 Type 2 diabetes mellitus with diabetic peripheral angiopathy without gangrene: Secondary | ICD-10-CM | POA: Diagnosis not present

## 2016-05-02 DIAGNOSIS — N189 Chronic kidney disease, unspecified: Secondary | ICD-10-CM | POA: Diagnosis not present

## 2016-05-02 DIAGNOSIS — Z7984 Long term (current) use of oral hypoglycemic drugs: Secondary | ICD-10-CM | POA: Diagnosis not present

## 2016-05-02 DIAGNOSIS — I129 Hypertensive chronic kidney disease with stage 1 through stage 4 chronic kidney disease, or unspecified chronic kidney disease: Secondary | ICD-10-CM | POA: Diagnosis not present

## 2016-05-02 DIAGNOSIS — Z9181 History of falling: Secondary | ICD-10-CM | POA: Diagnosis not present

## 2016-05-02 DIAGNOSIS — Z89511 Acquired absence of right leg below knee: Secondary | ICD-10-CM | POA: Diagnosis not present

## 2016-05-02 DIAGNOSIS — E119 Type 2 diabetes mellitus without complications: Secondary | ICD-10-CM

## 2016-05-02 DIAGNOSIS — I10 Essential (primary) hypertension: Secondary | ICD-10-CM

## 2016-05-02 MED ORDER — GLIPIZIDE ER 10 MG PO TB24: 10.0000 mg | ORAL_TABLET | Freq: Every day | ORAL | 1 refills | Status: DC

## 2016-05-02 MED ORDER — METFORMIN HCL 500 MG PO TABS: 500.0000 mg | ORAL_TABLET | Freq: Two times a day (BID) | ORAL | 1 refills | Status: DC

## 2016-05-02 MED ORDER — LISINOPRIL 2.5 MG PO TABS: 2.5000 mg | ORAL_TABLET | Freq: Every day | ORAL | 1 refills | Status: DC

## 2016-05-02 MED ORDER — METOPROLOL TARTRATE 50 MG PO TABS: 50.0000 mg | ORAL_TABLET | Freq: Two times a day (BID) | ORAL | 1 refills | Status: DC

## 2016-05-03 DIAGNOSIS — D649 Anemia, unspecified: Secondary | ICD-10-CM | POA: Diagnosis not present

## 2016-05-03 DIAGNOSIS — I129 Hypertensive chronic kidney disease with stage 1 through stage 4 chronic kidney disease, or unspecified chronic kidney disease: Secondary | ICD-10-CM | POA: Diagnosis not present

## 2016-05-03 DIAGNOSIS — Z7984 Long term (current) use of oral hypoglycemic drugs: Secondary | ICD-10-CM | POA: Diagnosis not present

## 2016-05-03 DIAGNOSIS — E1122 Type 2 diabetes mellitus with diabetic chronic kidney disease: Secondary | ICD-10-CM | POA: Diagnosis not present

## 2016-05-03 DIAGNOSIS — E1151 Type 2 diabetes mellitus with diabetic peripheral angiopathy without gangrene: Secondary | ICD-10-CM | POA: Diagnosis not present

## 2016-05-03 DIAGNOSIS — N189 Chronic kidney disease, unspecified: Secondary | ICD-10-CM | POA: Diagnosis not present

## 2016-05-03 DIAGNOSIS — Z89511 Acquired absence of right leg below knee: Secondary | ICD-10-CM | POA: Diagnosis not present

## 2016-05-03 DIAGNOSIS — Z4781 Encounter for orthopedic aftercare following surgical amputation: Secondary | ICD-10-CM | POA: Diagnosis not present

## 2016-05-03 DIAGNOSIS — Z9181 History of falling: Secondary | ICD-10-CM | POA: Diagnosis not present

## 2016-05-05 DIAGNOSIS — T8189XA Other complications of procedures, not elsewhere classified, initial encounter: Secondary | ICD-10-CM | POA: Diagnosis not present

## 2016-05-18 DIAGNOSIS — Z89512 Acquired absence of left leg below knee: Secondary | ICD-10-CM | POA: Diagnosis not present

## 2016-05-18 DIAGNOSIS — Z89421 Acquired absence of other right toe(s): Secondary | ICD-10-CM | POA: Diagnosis not present

## 2016-05-18 DIAGNOSIS — Z7902 Long term (current) use of antithrombotics/antiplatelets: Secondary | ICD-10-CM | POA: Diagnosis not present

## 2016-05-18 DIAGNOSIS — H538 Other visual disturbances: Secondary | ICD-10-CM | POA: Diagnosis not present

## 2016-05-18 DIAGNOSIS — E119 Type 2 diabetes mellitus without complications: Secondary | ICD-10-CM | POA: Diagnosis not present

## 2016-05-18 DIAGNOSIS — Z89619 Acquired absence of unspecified leg above knee: Secondary | ICD-10-CM | POA: Diagnosis not present

## 2016-05-18 DIAGNOSIS — Z7984 Long term (current) use of oral hypoglycemic drugs: Secondary | ICD-10-CM | POA: Diagnosis not present

## 2016-05-18 DIAGNOSIS — I739 Peripheral vascular disease, unspecified: Secondary | ICD-10-CM | POA: Diagnosis not present

## 2016-05-18 DIAGNOSIS — D649 Anemia, unspecified: Secondary | ICD-10-CM | POA: Diagnosis not present

## 2016-05-18 DIAGNOSIS — Z79899 Other long term (current) drug therapy: Secondary | ICD-10-CM | POA: Diagnosis not present

## 2016-05-18 DIAGNOSIS — I10 Essential (primary) hypertension: Secondary | ICD-10-CM | POA: Diagnosis not present

## 2016-05-18 DIAGNOSIS — Z89511 Acquired absence of right leg below knee: Secondary | ICD-10-CM | POA: Diagnosis not present

## 2016-05-18 DIAGNOSIS — Z89422 Acquired absence of other left toe(s): Secondary | ICD-10-CM | POA: Diagnosis not present

## 2016-05-19 ENCOUNTER — Encounter: Payer: Self-pay | Admitting: Family Medicine

## 2016-05-19 ENCOUNTER — Ambulatory Visit (INDEPENDENT_AMBULATORY_CARE_PROVIDER_SITE_OTHER): Payer: Medicare Other | Admitting: Family Medicine

## 2016-05-19 VITALS — BP 160/86 | HR 64 | Ht 74.0 in | Wt 155.0 lb

## 2016-05-19 DIAGNOSIS — R351 Nocturia: Secondary | ICD-10-CM

## 2016-05-19 DIAGNOSIS — I10 Essential (primary) hypertension: Secondary | ICD-10-CM | POA: Diagnosis not present

## 2016-05-19 MED ORDER — LISINOPRIL 5 MG PO TABS: 5.0000 mg | ORAL_TABLET | Freq: Every day | ORAL | 1 refills | Status: DC

## 2016-05-19 NOTE — Progress Notes (Signed)
Name: Jose Schroeder   MRN: [id number]    DOB: 22-Mar-1933   Date:05/19/2016       Progress Note  Subjective  Chief Complaint  Chief Complaint  Patient presents with  . Follow-up    recheck B/P    Hypertension  This is a chronic problem. The current episode started more than 1 year ago. The problem has been waxing and waning since onset. The problem is uncontrolled. Pertinent negatives include no anxiety, blurred vision, chest pain, headaches, malaise/fatigue, neck pain, orthopnea, palpitations, peripheral edema, PND, shortness of breath or sweats. There are no associated agents to hypertension. There are no known risk factors for coronary artery disease. Past treatments include ACE inhibitors and beta blockers. The current treatment provides mild improvement. There are no compliance problems.  There is no history of angina, kidney disease, CAD/MI, CVA, heart failure, left ventricular hypertrophy, PVD, renovascular disease or retinopathy. There is no history of chronic renal disease or a hypertension causing med.  Urinary Frequency   This is a new problem. The current episode started more than 1 month ago. The problem has been waxing and waning. Associated symptoms include frequency. Pertinent negatives include no chills, discharge, flank pain, hematuria, hesitancy, nausea, sweats or urgency.    No problem-specific Assessment & Plan notes found for this encounter.   Past Medical History:  Diagnosis Date  . Diabetes mellitus without complication (HCC)   . Hypertension   . Peripheral vascular disease [facility](HCC)     Past Surgical History:  Procedure Laterality Date  . AMPUTATION TOE Right 01/15/2016   Procedure: AMPUTATION TOE;  Surgeon: Gwyneth RevelsJustin Fowler, DPM;  Location: [facility] ORS;  Service: Podiatry;  Laterality: Right;  . PERIPHERAL VASCULAR CATHETERIZATION Right 01/07/2016   Procedure: Abdominal Aortogram w/Lower Extremity;  Surgeon: Renford DillsGregory G Schnier, MD;  Location: [facility] INVASIVE CV LAB;   Service: Cardiovascular;  Laterality: Right;  . TOE SURGERY     amputated 2 toes on L) foot    Family History  Problem Relation Age of Onset  . Diabetes Mother   . Hypertension Mother   . Diabetes Father   . Hypertension Father     Social History   Social History  . Marital status: Married    Spouse name: N/A  . Number of children: N/A  . Years of education: N/A   Occupational History  . Not on file.   Social History Main Topics  . Smoking status: Never Smoker  . Smokeless tobacco: Never Used  . Alcohol use No  . Drug use: No  . Sexual activity: Not Currently   Other Topics Concern  . Not on file   Social History Narrative  . No narrative on file    No Known Allergies   Review of Systems  Constitutional: Negative for chills, fever, malaise/fatigue and weight loss.  HENT: Negative for ear discharge, ear pain and sore throat.   Eyes: Negative for blurred vision.  Respiratory: Negative for cough, sputum production, shortness of breath and wheezing.   Cardiovascular: Negative for chest pain, palpitations, orthopnea, leg swelling and PND.  Gastrointestinal: Negative for abdominal pain, blood in stool, constipation, diarrhea, heartburn, melena and nausea.  Genitourinary: Positive for frequency. Negative for dysuria, flank pain, hematuria, hesitancy and urgency.       Nocturia  Musculoskeletal: Negative for back pain, joint pain, myalgias and neck pain.  Skin: Negative for rash.  Neurological: Negative for dizziness, tingling, sensory change, focal weakness and headaches.  Endo/Heme/Allergies: Negative for environmental allergies and polydipsia.  Does not bruise/bleed easily.  Psychiatric/Behavioral: Negative for depression and suicidal ideas. The patient is not nervous/anxious and does not have insomnia.      Objective  Vitals:   05/19/16 1337  BP: (!) 160/86  Pulse: 64  Weight: 155 lb (70.3 kg)  Height: 6\' 2"  (1.88 m)    Physical Exam  Constitutional: He  is oriented to person, place, and time and well-developed, well-nourished, and in no distress.  HENT:  Head: Normocephalic.  Right Ear: External ear normal.  Left Ear: External ear normal.  Nose: Nose normal.  Mouth/Throat: Oropharynx is clear and moist.  Eyes: Conjunctivae and EOM are normal. Pupils are equal, round, and reactive to light. Right eye exhibits no discharge. Left eye exhibits no discharge. No scleral icterus.  Neck: Normal range of motion. Neck supple. No JVD present. No tracheal deviation present. No thyromegaly present.  Cardiovascular: Normal rate, regular rhythm, normal heart sounds and intact distal pulses.  Exam reveals no gallop and no friction rub.   No murmur heard. Pulmonary/Chest: Breath sounds normal. No respiratory distress. He has no wheezes. He has no rales.  Abdominal: Soft. Bowel sounds are normal. He exhibits no mass. There is no hepatosplenomegaly. There is no tenderness. There is no rebound, no guarding and no CVA tenderness.  Musculoskeletal: Normal range of motion. He exhibits no edema or tenderness.  Lymphadenopathy:    He has no cervical adenopathy.  Neurological: He is alert and oriented to person, place, and time. He has normal sensation, normal strength and intact cranial nerves. No cranial nerve deficit.  Skin: Skin is warm. No rash noted.  Psychiatric: Mood and affect normal.  Nursing note and vitals reviewed.     Assessment & Plan  Problem List Items Addressed This Visit      Cardiovascular and Mediastinum   Essential hypertension - Primary   Relevant Medications   lisinopril (PRINIVIL,ZESTRIL) 5 MG tablet    Other Visit Diagnoses    Nocturia more than twice per night       Relevant Orders   PSA        Dr. [facility]  05/19/16

## 2016-05-20 LAB — PSA: Prostate Specific Ag, Serum: 1 ng/mL (ref 0.0–4.0)

## 2016-05-25 DIAGNOSIS — E119 Type 2 diabetes mellitus without complications: Secondary | ICD-10-CM | POA: Diagnosis not present

## 2016-05-25 DIAGNOSIS — Z89512 Acquired absence of left leg below knee: Secondary | ICD-10-CM | POA: Diagnosis not present

## 2016-05-25 DIAGNOSIS — I10 Essential (primary) hypertension: Secondary | ICD-10-CM | POA: Diagnosis not present

## 2016-05-26 ENCOUNTER — Other Ambulatory Visit: Payer: Self-pay

## 2016-05-27 ENCOUNTER — Encounter: Payer: Self-pay | Admitting: Family Medicine

## 2016-05-27 ENCOUNTER — Ambulatory Visit (INDEPENDENT_AMBULATORY_CARE_PROVIDER_SITE_OTHER): Payer: Medicare Other | Admitting: Family Medicine

## 2016-05-27 VITALS — BP 170/100 | HR 80 | Ht 74.0 in | Wt 155.0 lb

## 2016-05-27 DIAGNOSIS — I10 Essential (primary) hypertension: Secondary | ICD-10-CM

## 2016-05-27 DIAGNOSIS — E1159 Type 2 diabetes mellitus with other circulatory complications: Secondary | ICD-10-CM | POA: Diagnosis not present

## 2016-05-27 MED ORDER — AMLODIPINE BESYLATE 5 MG PO TABS: 5.0000 mg | ORAL_TABLET | Freq: Every day | ORAL | 3 refills | Status: DC

## 2016-05-27 NOTE — Progress Notes (Signed)
Name: Jose Schroeder   MRN: [id number]    DOB: 03-06-1933   Date:05/27/2016       Progress Note  Subjective  Chief Complaint  Chief Complaint  Patient presents with  . Hypertension    Hypertension  This is a recurrent problem. The current episode started in the past 7 days. The problem has been waxing and waning since onset. The problem is uncontrolled. Pertinent negatives include no anxiety, blurred vision, chest pain, headaches, malaise/fatigue, neck pain, orthopnea, palpitations, peripheral edema, PND, shortness of breath or sweats. There are no associated agents to hypertension. There are no known risk factors for coronary artery disease. Past treatments include ACE inhibitors and beta blockers. The current treatment provides no improvement. There are no compliance problems.  There is no history of angina, kidney disease, CAD/MI, CVA, heart failure, left ventricular hypertrophy, PVD or retinopathy.    No problem-specific Assessment & Plan notes found for this encounter.   Past Medical History:  Diagnosis Date  . Diabetes mellitus without complication (HCC)   . Hypertension   . Peripheral vascular disease [facility])     Past Surgical History:  Procedure Laterality Date  . AMPUTATION TOE Right 01/15/2016   Procedure: AMPUTATION TOE;  Surgeon: Gwyneth Revels, DPM;  Location: [facility] ORS;  Service: Podiatry;  Laterality: Right;  . PERIPHERAL VASCULAR CATHETERIZATION Right 01/07/2016   Procedure: Abdominal Aortogram w/Lower Extremity;  Surgeon: Renford Dills, MD;  Location: [facility] INVASIVE CV LAB;  Service: Cardiovascular;  Laterality: Right;  . TOE SURGERY     amputated 2 toes on L) foot    Family History  Problem Relation Age of Onset  . Diabetes Mother   . Hypertension Mother   . Diabetes Father   . Hypertension Father     Social History   Social History  . Marital status: Married    Spouse name: N/A  . Number of children: N/A  . Years of education: N/A   Occupational  History  . Not on file.   Social History Main Topics  . Smoking status: Never Smoker  . Smokeless tobacco: Never Used  . Alcohol use No  . Drug use: No  . Sexual activity: Not Currently   Other Topics Concern  . Not on file   Social History Narrative  . No narrative on file    No Known Allergies   Review of Systems  Constitutional: Negative for chills, fever, malaise/fatigue and weight loss.  HENT: Negative for ear discharge, ear pain and sore throat.   Eyes: Negative for blurred vision.  Respiratory: Negative for cough, sputum production, shortness of breath and wheezing.   Cardiovascular: Negative for chest pain, palpitations, orthopnea, claudication, leg swelling and PND.  Gastrointestinal: Negative for abdominal pain, blood in stool, constipation, diarrhea, heartburn, melena and nausea.  Genitourinary: Negative for dysuria, frequency, hematuria and urgency.       Nocturia  Musculoskeletal: Negative for back pain, joint pain, myalgias and neck pain.  Skin: Negative for rash.  Neurological: Negative for dizziness, tingling, sensory change, focal weakness and headaches.  Endo/Heme/Allergies: Negative for environmental allergies and polydipsia. Does not bruise/bleed easily.  Psychiatric/Behavioral: Negative for depression and suicidal ideas. The patient is not nervous/anxious and does not have insomnia.      Objective  Vitals:   05/27/16 1350  BP: (!) 170/100  Pulse: 80  Weight: 155 lb (70.3 kg)  Height: 6\' 2"  (1.88 m)    Physical Exam  Constitutional: He is oriented to person, place, and time  and well-developed, well-nourished, and in no distress.  HENT:  Head: Normocephalic.  Right Ear: External ear normal.  Left Ear: External ear normal.  Nose: Nose normal.  Mouth/Throat: Oropharynx is clear and moist.  Eyes: Conjunctivae and EOM are normal. Pupils are equal, round, and reactive to light. Right eye exhibits no discharge. Left eye exhibits no discharge. No  scleral icterus.  Neck: Normal range of motion. Neck supple. No JVD present. No tracheal deviation present. No thyromegaly present.  Cardiovascular: Normal rate, regular rhythm, normal heart sounds and intact distal pulses.  Exam reveals no gallop and no friction rub.   No murmur heard. Pulmonary/Chest: Breath sounds normal. No respiratory distress. He has no wheezes. He has no rales.  Abdominal: Soft. Bowel sounds are normal. He exhibits no mass. There is no hepatosplenomegaly. There is no tenderness. There is no rebound, no guarding and no CVA tenderness.  Musculoskeletal: Normal range of motion. He exhibits no edema or tenderness.  Lymphadenopathy:    He has no cervical adenopathy.  Neurological: He is alert and oriented to person, place, and time. He has normal sensation, normal strength, normal reflexes and intact cranial nerves. No cranial nerve deficit.  Skin: Skin is warm. No rash noted.  Psychiatric: Mood and affect normal.  Nursing note and vitals reviewed.     Assessment & Plan  Problem List Items Addressed This Visit    None    Visit Diagnoses    Hypertension associated with diabetes (HCC)    -  Primary   Relevant Medications   amLODipine (NORVASC) 5 MG tablet        Dr. Hayden Rasmusseneanna Won [facility]uzer [facility]  05/27/16

## 2016-06-03 ENCOUNTER — Encounter: Payer: Self-pay | Admitting: Family Medicine

## 2016-06-03 ENCOUNTER — Ambulatory Visit (INDEPENDENT_AMBULATORY_CARE_PROVIDER_SITE_OTHER): Payer: Medicare Other | Admitting: Family Medicine

## 2016-06-03 VITALS — BP 160/98 | HR 72 | Ht 74.0 in | Wt 155.0 lb

## 2016-06-03 DIAGNOSIS — E1159 Type 2 diabetes mellitus with other circulatory complications: Secondary | ICD-10-CM | POA: Diagnosis not present

## 2016-06-03 DIAGNOSIS — I10 Essential (primary) hypertension: Secondary | ICD-10-CM

## 2016-06-03 DIAGNOSIS — B9789 Other viral agents as the cause of diseases classified elsewhere: Secondary | ICD-10-CM | POA: Diagnosis not present

## 2016-06-03 DIAGNOSIS — J069 Acute upper respiratory infection, unspecified: Secondary | ICD-10-CM

## 2016-06-03 MED ORDER — AMLODIPINE BESYLATE 10 MG PO TABS: 10.0000 mg | ORAL_TABLET | Freq: Every day | ORAL | 1 refills | Status: DC

## 2016-06-03 NOTE — Progress Notes (Signed)
Name: Jose Schroeder   MRN: [id number]    DOB: 1932-07-15   Date:06/03/2016       Progress Note  Subjective  Chief Complaint  Chief Complaint  Patient presents with  . Hypertension    recheck B/P  . sneezing    "got a cold"    Hypertension  This is a chronic problem. The current episode started more than 1 month ago. The problem has been waxing and waning since onset. The problem is uncontrolled. Pertinent negatives include no anxiety, blurred vision, chest pain, headaches, malaise/fatigue, neck pain, orthopnea, palpitations, peripheral edema, PND, shortness of breath or sweats. There are no associated agents to hypertension. There are no known risk factors for coronary artery disease. Past treatments include calcium channel blockers. The current treatment provides mild improvement. There are no compliance problems.  There is no history of angina, kidney disease, CAD/MI, CVA, heart failure, left ventricular hypertrophy, PVD, renovascular disease or retinopathy. There is no history of chronic renal disease or a hypertension causing med.  URI   This is a new problem. The current episode started in the past 7 days. There has been no fever. Associated symptoms include rhinorrhea and sneezing. Pertinent negatives include no abdominal pain, chest pain, congestion, coughing, diarrhea, dysuria, ear pain, headaches, joint pain, nausea, neck pain, rash, sinus pain, sore throat, swollen glands, vomiting or wheezing. He has tried nothing for the symptoms. The treatment provided mild relief.    No problem-specific Assessment & Plan notes found for this encounter.   Past Medical History:  Diagnosis Date  . Diabetes mellitus without complication (HCC)   . Hypertension   . Peripheral vascular disease [facility](HCC)     Past Surgical History:  Procedure Laterality Date  . AMPUTATION TOE Right 01/15/2016   Procedure: AMPUTATION TOE;  Surgeon: Gwyneth RevelsJustin Fowler, DPM;  Location: [facility] ORS;  Service: Podiatry;   Laterality: Right;  . PERIPHERAL VASCULAR CATHETERIZATION Right 01/07/2016   Procedure: Abdominal Aortogram w/Lower Extremity;  Surgeon: Renford DillsGregory G Schnier, MD;  Location: [facility] INVASIVE CV LAB;  Service: Cardiovascular;  Laterality: Right;  . TOE SURGERY     amputated 2 toes on L) foot    Family History  Problem Relation Age of Onset  . Diabetes Mother   . Hypertension Mother   . Diabetes Father   . Hypertension Father     Social History   Social History  . Marital status: Married    Spouse name: N/A  . Number of children: N/A  . Years of education: N/A   Occupational History  . Not on file.   Social History Main Topics  . Smoking status: Never Smoker  . Smokeless tobacco: Never Used  . Alcohol use No  . Drug use: No  . Sexual activity: Not Currently   Other Topics Concern  . Not on file   Social History Narrative  . No narrative on file    No Known Allergies   Review of Systems  Constitutional: Negative for chills, fever, malaise/fatigue and weight loss.  HENT: Positive for rhinorrhea and sneezing. Negative for congestion, ear discharge, ear pain, sinus pain and sore throat.   Eyes: Negative for blurred vision.  Respiratory: Negative for cough, sputum production, shortness of breath and wheezing.   Cardiovascular: Negative for chest pain, palpitations, orthopnea, leg swelling and PND.  Gastrointestinal: Negative for abdominal pain, blood in stool, constipation, diarrhea, heartburn, melena, nausea and vomiting.  Genitourinary: Negative for dysuria, frequency, hematuria and urgency.  Musculoskeletal: Negative for back pain,  joint pain, myalgias and neck pain.  Skin: Negative for rash.  Neurological: Negative for dizziness, tingling, sensory change, focal weakness and headaches.  Endo/Heme/Allergies: Negative for environmental allergies and polydipsia. Does not bruise/bleed easily.  Psychiatric/Behavioral: Negative for depression and suicidal ideas. The patient is  not nervous/anxious and does not have insomnia.      Objective  Vitals:   06/03/16 1343  BP: (!) 160/98  Pulse: 72  Weight: 155 lb (70.3 kg)  Height: 6\' 2"  (1.88 m)    Physical Exam  Constitutional: He is well-developed, well-nourished, and in no distress.  HENT:  Head: Normocephalic.  Right Ear: External ear normal.  Left Ear: External ear normal.  Nose: Mucosal edema present.  Mouth/Throat: No posterior oropharyngeal edema or posterior oropharyngeal erythema.  Eyes: Conjunctivae and EOM are normal. Pupils are equal, round, and reactive to light. Right eye exhibits no discharge. Left eye exhibits no discharge. No scleral icterus.  Neck: Normal range of motion. Neck supple. No JVD present. No tracheal deviation present. No thyromegaly present.  Cardiovascular: Normal rate, regular rhythm, normal heart sounds and intact distal pulses.  Exam reveals no gallop and no friction rub.   No murmur heard. Pulmonary/Chest: Breath sounds normal. No respiratory distress. He has no wheezes. He has no rales.  Abdominal: Soft. Bowel sounds are normal. He exhibits no mass. There is no hepatosplenomegaly. There is no tenderness. There is no rebound, no guarding and no CVA tenderness.  Musculoskeletal: Normal range of motion. He exhibits no edema or tenderness.  Lymphadenopathy:    He has no cervical adenopathy.  Neurological: He has normal sensation, normal strength and intact cranial nerves. No cranial nerve deficit.  Skin: Skin is warm. No rash noted.  Psychiatric: Mood and affect normal.  Nursing note and vitals reviewed.     Assessment & Plan  Problem List Items Addressed This Visit    None    Visit Diagnoses    Hypertension associated with diabetes (HCC)    -  Primary   Relevant Medications   amLODipine (NORVASC) 10 MG tablet   Viral upper respiratory tract infection       suggest corcedin HBP        Dr. Hayden [facility]  06/03/16

## 2016-06-17 ENCOUNTER — Ambulatory Visit (INDEPENDENT_AMBULATORY_CARE_PROVIDER_SITE_OTHER): Payer: Medicare Other | Admitting: Family Medicine

## 2016-06-17 ENCOUNTER — Encounter: Payer: Self-pay | Admitting: Family Medicine

## 2016-06-17 VITALS — BP 132/70 | HR 84 | Ht 74.0 in | Wt 155.0 lb

## 2016-06-17 DIAGNOSIS — I10 Essential (primary) hypertension: Secondary | ICD-10-CM

## 2016-06-17 DIAGNOSIS — E1159 Type 2 diabetes mellitus with other circulatory complications: Secondary | ICD-10-CM

## 2016-06-17 DIAGNOSIS — M25421 Effusion, right elbow: Secondary | ICD-10-CM | POA: Diagnosis not present

## 2016-06-17 MED ORDER — AMLODIPINE BESYLATE 10 MG PO TABS: 10.0000 mg | ORAL_TABLET | Freq: Every day | ORAL | 1 refills | Status: DC

## 2016-06-17 MED ORDER — METOPROLOL TARTRATE 50 MG PO TABS: 50.0000 mg | ORAL_TABLET | Freq: Two times a day (BID) | ORAL | 1 refills | Status: DC

## 2016-06-17 MED ORDER — LISINOPRIL 5 MG PO TABS: 5.0000 mg | ORAL_TABLET | Freq: Every day | ORAL | 1 refills | Status: DC

## 2016-06-17 NOTE — Progress Notes (Signed)
Name: Jose Schroeder   MRN: [id number]    DOB: 03/07/1933   Date:06/17/2016       Progress Note  Subjective  Chief Complaint  Chief Complaint  Patient presents with  . Hypertension  . Joint Swelling    fluid on R) elbow    Hypertension  This is a chronic problem. The current episode started more than 1 year ago. The problem has been gradually improving since onset. The problem is controlled. Pertinent negatives include no anxiety, blurred vision, chest pain, headaches, malaise/fatigue, neck pain, orthopnea, palpitations, peripheral edema, PND, shortness of breath or sweats. There are no associated agents to hypertension. Risk factors for coronary artery disease include male gender. Past treatments include beta blockers, calcium channel blockers and ACE inhibitors. The current treatment provides mild improvement. There are no compliance problems.  There is no history of angina, kidney disease, CAD/MI, CVA, heart failure, left ventricular hypertrophy, PVD, renovascular disease or retinopathy. There is no history of chronic renal disease or a hypertension causing med.  Arm Pain   The incident occurred more than 1 week ago. The pain is present in the right elbow. The quality of the pain is described as aching. The pain does not radiate. The pain is at a severity of 5/10. The pain is moderate. Pertinent negatives include no chest pain or tingling.    No problem-specific Assessment & Plan notes found for this encounter.   Past Medical History:  Diagnosis Date  . Diabetes mellitus without complication (HCC)   . Hypertension   . Peripheral vascular disease [facility])     Past Surgical History:  Procedure Laterality Date  . AMPUTATION TOE Right 01/15/2016   Procedure: AMPUTATION TOE;  Surgeon: Gwyneth Revels, DPM;  Location: [facility] ORS;  Service: Podiatry;  Laterality: Right;  . PERIPHERAL VASCULAR CATHETERIZATION Right 01/07/2016   Procedure: Abdominal Aortogram w/Lower Extremity;  Surgeon:  Renford Dills, MD;  Location: [facility] INVASIVE CV LAB;  Service: Cardiovascular;  Laterality: Right;  . TOE SURGERY     amputated 2 toes on L) foot    Family History  Problem Relation Age of Onset  . Diabetes Mother   . Hypertension Mother   . Diabetes Father   . Hypertension Father     Social History   Social History  . Marital status: Married    Spouse name: N/A  . Number of children: N/A  . Years of education: N/A   Occupational History  . Not on file.   Social History Main Topics  . Smoking status: Never Smoker  . Smokeless tobacco: Never Used  . Alcohol use No  . Drug use: No  . Sexual activity: Not Currently   Other Topics Concern  . Not on file   Social History Narrative  . No narrative on file    No Known Allergies  Outpatient Medications Prior to Visit  Medication Sig Dispense Refill  . aspirin EC 81 MG tablet Take 1 tablet (81 mg total) by mouth daily. 30 tablet 2  . ferrous sulfate 325 (65 FE) MG tablet Take 1 tablet (325 mg total) by mouth daily with breakfast. 30 tablet 5  . glipiZIDE (GLUCOTROL XL) 10 MG 24 hr tablet Take 1 tablet (10 mg total) by mouth daily with breakfast. 90 tablet 1  . loratadine (CLARITIN) 10 MG tablet Take 1 tablet (10 mg total) by mouth daily. 30 tablet 11  . metFORMIN (GLUCOPHAGE) 500 MG tablet Take 1 tablet (500 mg total) by mouth 2 (two)  times daily. 180 tablet 1  . amLODipine (NORVASC) 10 MG tablet Take 1 tablet (10 mg total) by mouth daily. 30 tablet 1  . lisinopril (PRINIVIL,ZESTRIL) 5 MG tablet Take 1 tablet (5 mg total) by mouth daily. 30 tablet 1  . metoprolol (LOPRESSOR) 50 MG tablet Take 1 tablet (50 mg total) by mouth 2 (two) times daily. 180 tablet 1   No facility-administered medications prior to visit.     Review of Systems  Constitutional: Negative for chills, fever, malaise/fatigue and weight loss.  HENT: Negative for ear discharge, ear pain and sore throat.   Eyes: Negative for blurred vision.   Respiratory: Negative for cough, sputum production, shortness of breath and wheezing.   Cardiovascular: Negative for chest pain, palpitations, orthopnea, leg swelling and PND.  Gastrointestinal: Negative for abdominal pain, blood in stool, constipation, diarrhea, heartburn, melena and nausea.  Genitourinary: Negative for dysuria, frequency, hematuria and urgency.  Musculoskeletal: Negative for back pain, joint pain, myalgias and neck pain.  Skin: Negative for rash.  Neurological: Negative for dizziness, tingling, sensory change, focal weakness and headaches.  Endo/Heme/Allergies: Negative for environmental allergies and polydipsia. Does not bruise/bleed easily.  Psychiatric/Behavioral: Negative for depression and suicidal ideas. The patient is not nervous/anxious and does not have insomnia.      Objective  Vitals:   06/17/16 1334  BP: 132/70  Pulse: 84  Weight: 155 lb (70.3 kg)  Height: 6\' 2"  (1.88 m)    Physical Exam  Constitutional: He is oriented to person, place, and time and well-developed, well-nourished, and in no distress.  HENT:  Head: Normocephalic.  Right Ear: External ear normal.  Left Ear: External ear normal.  Nose: Nose normal.  Mouth/Throat: Oropharynx is clear and moist.  Eyes: Conjunctivae and EOM are normal. Pupils are equal, round, and reactive to light. Right eye exhibits no discharge. Left eye exhibits no discharge. No scleral icterus.  Neck: Normal range of motion. Neck supple. No JVD present. No tracheal deviation present. No thyromegaly present.  Cardiovascular: Normal rate, regular rhythm, normal heart sounds and intact distal pulses.  Exam reveals no gallop and no friction rub.   No murmur heard. Pulmonary/Chest: Breath sounds normal. No respiratory distress. He has no wheezes. He has no rales.  Abdominal: Soft. Bowel sounds are normal. He exhibits no mass. There is no hepatosplenomegaly. There is no tenderness. There is no rebound, no guarding and no  CVA tenderness.  Musculoskeletal: Normal range of motion. He exhibits no edema or tenderness.  Lymphadenopathy:    He has no cervical adenopathy.  Neurological: He is alert and oriented to person, place, and time. He has normal sensation, normal strength, normal reflexes and intact cranial nerves. No cranial nerve deficit.  Skin: Skin is warm. No rash noted.  Psychiatric: Mood and affect normal.  Nursing note and vitals reviewed.     Assessment & Plan  Problem List Items Addressed This Visit      Cardiovascular and Mediastinum   Essential hypertension - Primary   Relevant Medications   metoprolol (LOPRESSOR) 50 MG tablet   lisinopril (PRINIVIL,ZESTRIL) 5 MG tablet   amLODipine (NORVASC) 10 MG tablet    Other Visit Diagnoses    Effusion of right olecranon bursa       Hypertension associated with diabetes (HCC)       Relevant Medications   metoprolol (LOPRESSOR) 50 MG tablet   lisinopril (PRINIVIL,ZESTRIL) 5 MG tablet   amLODipine (NORVASC) 10 MG tablet      Meds ordered this  encounter  Medications  . metoprolol (LOPRESSOR) 50 MG tablet    Sig: Take 1 tablet (50 mg total) by mouth 2 (two) times daily.    Dispense:  180 tablet    Refill:  1    sched appt- not seen in 9 months  . lisinopril (PRINIVIL,ZESTRIL) 5 MG tablet    Sig: Take 1 tablet (5 mg total) by mouth daily.    Dispense:  90 tablet    Refill:  1  . amLODipine (NORVASC) 10 MG tablet    Sig: Take 1 tablet (10 mg total) by mouth daily.    Dispense:  90 tablet    Refill:  1      [facility]. [facility]anna Jones [facility]  06/17/16

## 2016-06-17 NOTE — Patient Instructions (Signed)
Ice Massage and Cold Packs Home Program  Ice and cold packs are used so that we can return the muscle to it's natural resting state without causing more pain, which can lead to more spasm, etc.  Icing and cold packs are also used to reduce swelling, which can lead to pain and stiffness.  COLD PACKS Cold packs should be placed circumferentially around the swollen area (ie., the wrist, etc.).  A paper towel can be placed over the area before the cold pack is applied.  A towel may be wrapped around the outside of the cold pack to keep the cold in.  The swollen area should be elevated with the cold pack, if possible.  ICE MASSAGE Fill a 4 ounce paper cup three-quarters full and put it in a freezer until it is frozen.  When ready to use, tear off about 1 inch of the cup so that some of the ice is showing while the bottom of the cup can be used to hold onto. Massage the entire muscle area as instructed by your therapist.  You may use circular or up and down strokes, but do not hold the ice in one spot.  Four phases to the ice massage and cold pack application: 1. Cold: which you feel when you first apply the ice. 2. Ache: after a few minutes 3. Burning: after approximately five minutes, it will feel like your skin is burning.  At this point, remove the ice for a minute or so. 4. Numbness: THIS IS THE CRUCIAL PHASE!!! Return the ice or cold pack and massage until all the burning disappears.  This signals the end of cryotherapy.  The entire procedure should take ten to fifteen minutes while using the cold pack.  Do Not perform the ice massage for more than seven minutes on a small area or more than ten minutes on a large area.  Cryotherapy should be performed after exercises, when edema occurs, or when an area is painful.Elbow Bursitis Introduction Elbow bursitis is inflammation of the fluid-filled sac (bursa) between the tip of your elbow bone (olecranon) and your skin. Elbow bursitis may also be called  olecranon bursitis. Normally, the olecranon bursa has only a small amount of fluid in it to cushion and protect your elbow bone. Elbow bursitis causes fluid to build up inside the bursa. Over time, this swelling and inflammation can cause pain when you bend or lean on your elbow. What are the causes? Elbow bursitis may be caused by:  Elbow injury (acute trauma).  Leaning on hard surfaces for long periods of time.  Infection from an injury that breaks the skin near your elbow.  A bone growth (spur) that forms at the tip of your elbow.  A medical condition that causes inflammation in your body, such as gout or rheumatoid arthritis. The cause may also be unknown. What are the signs or symptoms? The first sign of elbow bursitis is usually swelling over the tip of your elbow. This can grow to be the size of a golf ball. This may start suddenly or develop gradually. You may also have:  Pain when bending or leaning on your elbow.  Restricted movement of your elbow. If your bursitis is caused by an infection, symptoms may also include:  Redness, warmth, and tenderness of the elbow.  Drainage of pus from the swollen area over your elbow, if the skin breaks open. How is this diagnosed? Your health care provider may be able to diagnose elbow bursitis based on  your signs and symptoms, especially if you have recently been injured. Your health care provider will also do a physical exam. This may include:  X-rays to look for a bone spur or a bone fracture.  Draining fluid from the bursa to test it for infection.  Blood tests to rule out gout or rheumatoid arthritis. How is this treated? Treatment for elbow bursitis depends on the cause. Treatment may include:  Medicines. These may include:  Over-the-counter medicines to relieve pain and inflammation.  Antibiotic medicines to fight infection.  Injections of anti-inflammatory medicines (steroids).  Wrapping your elbow with a  bandage.  Draining fluid from the bursa.  Wearing elbow pads. If your bursitis does not get better with treatment, surgery may be needed to remove the bursa. Follow these instructions at home:  Take medicines only as directed by your health care provider.  If you were prescribed an antibiotic medicine, finish all of it even if you start to feel better.  If your bursitis is caused by an injury, rest your elbow and wear your bandage as directed by your health care provider. You may alsoapply ice to the injured area as directed by your health care provider:  Put ice in a plastic bag.  Place a towel between your skin and the bag.  Leave the ice on for 20 minutes, 2-3 times per day.  Avoid any activities that cause elbow pain.  Use elbow pads or elbow wraps to cushion your elbow. Contact a health care provider if:  You have a fever.  Your symptoms do not get better with treatment.  Your pain or swelling gets worse.  Your elbow pain or swelling goes away and then returns.  You have drainage of pus from the swollen area over your elbow. This information is not intended to replace advice given to you by your health care provider. Make sure you discuss any questions you have with your health care provider. Document Released: 05/11/2006 Document Revised: 09/17/2015 Document Reviewed: 12/18/2013  2017 [employer]

## 2016-06-29 DIAGNOSIS — E119 Type 2 diabetes mellitus without complications: Secondary | ICD-10-CM | POA: Diagnosis not present

## 2016-07-13 ENCOUNTER — Telehealth: Payer: Self-pay

## 2016-07-13 NOTE — Telephone Encounter (Signed)
Jose MccreedyBarbara called said Jose Schroeder is asking for patient to start therapy her  phone number is  [phone]  Fax # [phone] and needs an  order for the  pt and op.

## 2016-07-14 NOTE — Telephone Encounter (Signed)
Spoke to [employer] on phone- they asked for a Rx for OT, PT and home health for pt to be faxed to [phone]- sent Rx and office notes after speaking to wife

## 2016-07-15 DIAGNOSIS — E1159 Type 2 diabetes mellitus with other circulatory complications: Secondary | ICD-10-CM | POA: Diagnosis not present

## 2016-07-15 DIAGNOSIS — Z89511 Acquired absence of right leg below knee: Secondary | ICD-10-CM | POA: Diagnosis not present

## 2016-07-15 DIAGNOSIS — Z7982 Long term (current) use of aspirin: Secondary | ICD-10-CM | POA: Diagnosis not present

## 2016-07-15 DIAGNOSIS — Z7984 Long term (current) use of oral hypoglycemic drugs: Secondary | ICD-10-CM | POA: Diagnosis not present

## 2016-07-15 DIAGNOSIS — E1151 Type 2 diabetes mellitus with diabetic peripheral angiopathy without gangrene: Secondary | ICD-10-CM | POA: Diagnosis not present

## 2016-07-15 DIAGNOSIS — M25421 Effusion, right elbow: Secondary | ICD-10-CM | POA: Diagnosis not present

## 2016-07-15 DIAGNOSIS — I10 Essential (primary) hypertension: Secondary | ICD-10-CM | POA: Diagnosis not present

## 2016-07-18 DIAGNOSIS — I10 Essential (primary) hypertension: Secondary | ICD-10-CM | POA: Diagnosis not present

## 2016-07-18 DIAGNOSIS — M25421 Effusion, right elbow: Secondary | ICD-10-CM | POA: Diagnosis not present

## 2016-07-18 DIAGNOSIS — E1159 Type 2 diabetes mellitus with other circulatory complications: Secondary | ICD-10-CM | POA: Diagnosis not present

## 2016-07-18 DIAGNOSIS — E1151 Type 2 diabetes mellitus with diabetic peripheral angiopathy without gangrene: Secondary | ICD-10-CM | POA: Diagnosis not present

## 2016-07-18 DIAGNOSIS — Z89511 Acquired absence of right leg below knee: Secondary | ICD-10-CM | POA: Diagnosis not present

## 2016-07-18 DIAGNOSIS — Z7984 Long term (current) use of oral hypoglycemic drugs: Secondary | ICD-10-CM | POA: Diagnosis not present

## 2016-07-18 DIAGNOSIS — Z7982 Long term (current) use of aspirin: Secondary | ICD-10-CM | POA: Diagnosis not present

## 2016-07-20 DIAGNOSIS — I10 Essential (primary) hypertension: Secondary | ICD-10-CM | POA: Diagnosis not present

## 2016-07-20 DIAGNOSIS — E1151 Type 2 diabetes mellitus with diabetic peripheral angiopathy without gangrene: Secondary | ICD-10-CM | POA: Diagnosis not present

## 2016-07-20 DIAGNOSIS — Z7982 Long term (current) use of aspirin: Secondary | ICD-10-CM | POA: Diagnosis not present

## 2016-07-20 DIAGNOSIS — M25421 Effusion, right elbow: Secondary | ICD-10-CM | POA: Diagnosis not present

## 2016-07-20 DIAGNOSIS — E1159 Type 2 diabetes mellitus with other circulatory complications: Secondary | ICD-10-CM | POA: Diagnosis not present

## 2016-07-20 DIAGNOSIS — Z7984 Long term (current) use of oral hypoglycemic drugs: Secondary | ICD-10-CM | POA: Diagnosis not present

## 2016-07-20 DIAGNOSIS — Z89511 Acquired absence of right leg below knee: Secondary | ICD-10-CM | POA: Diagnosis not present

## 2016-07-25 DIAGNOSIS — Z7982 Long term (current) use of aspirin: Secondary | ICD-10-CM | POA: Diagnosis not present

## 2016-07-25 DIAGNOSIS — Z89511 Acquired absence of right leg below knee: Secondary | ICD-10-CM | POA: Diagnosis not present

## 2016-07-25 DIAGNOSIS — I10 Essential (primary) hypertension: Secondary | ICD-10-CM | POA: Diagnosis not present

## 2016-07-25 DIAGNOSIS — E1159 Type 2 diabetes mellitus with other circulatory complications: Secondary | ICD-10-CM | POA: Diagnosis not present

## 2016-07-25 DIAGNOSIS — M25421 Effusion, right elbow: Secondary | ICD-10-CM | POA: Diagnosis not present

## 2016-07-25 DIAGNOSIS — Z7984 Long term (current) use of oral hypoglycemic drugs: Secondary | ICD-10-CM | POA: Diagnosis not present

## 2016-07-25 DIAGNOSIS — E1151 Type 2 diabetes mellitus with diabetic peripheral angiopathy without gangrene: Secondary | ICD-10-CM | POA: Diagnosis not present

## 2016-07-27 ENCOUNTER — Emergency Department
Admission: EM | Admit: 2016-07-27 | Discharge: 2016-07-27 | Disposition: A | Discharge status: Home / Self Care | Payer: Medicare Other | Attending: Emergency Medicine | Admitting: Emergency Medicine

## 2016-07-27 ENCOUNTER — Encounter: Payer: Self-pay | Admitting: Intensive Care

## 2016-07-27 DIAGNOSIS — E1151 Type 2 diabetes mellitus with diabetic peripheral angiopathy without gangrene: Secondary | ICD-10-CM | POA: Diagnosis not present

## 2016-07-27 DIAGNOSIS — Z7984 Long term (current) use of oral hypoglycemic drugs: Secondary | ICD-10-CM | POA: Insufficient documentation

## 2016-07-27 DIAGNOSIS — Z7982 Long term (current) use of aspirin: Secondary | ICD-10-CM | POA: Diagnosis not present

## 2016-07-27 DIAGNOSIS — I10 Essential (primary) hypertension: Secondary | ICD-10-CM | POA: Diagnosis not present

## 2016-07-27 DIAGNOSIS — E1159 Type 2 diabetes mellitus with other circulatory complications: Secondary | ICD-10-CM | POA: Diagnosis not present

## 2016-07-27 DIAGNOSIS — E119 Type 2 diabetes mellitus without complications: Secondary | ICD-10-CM | POA: Diagnosis not present

## 2016-07-27 DIAGNOSIS — Z89511 Acquired absence of right leg below knee: Secondary | ICD-10-CM | POA: Diagnosis not present

## 2016-07-27 DIAGNOSIS — M25421 Effusion, right elbow: Secondary | ICD-10-CM | POA: Diagnosis not present

## 2016-07-27 NOTE — ED Notes (Signed)
EDP at bedside, states hx of HTN, states his PT checked his BP and told him it was high, denies any chest pain or SOB

## 2016-07-27 NOTE — Discharge Instructions (Signed)
Please seek medical attention for any high fevers, chest pain, shortness of breath, change in behavior, persistent vomiting, bloody stool or any other new or concerning symptoms.  

## 2016-07-27 NOTE — ED Provider Notes (Signed)
[facility] Emergency Department Provider Note   ____________________________________________   I have reviewed the triage vital signs and the nursing notes.   HISTORY  Chief Complaint Hypertension   History limited by: Not Limited   HPI Jose Schroeder is a 83 y.o. male who presents to the emergency department today because of concern for high blood pressure. Patient has a history of hypertension and is followed by his PCP. Last medication change was one month ago. Patient was seen by his home PT today and he was noted to have elevated blood pressure. He denies any symptoms. Denies any chest pain, headache or shortness of breath. Apparently PT called PCP who recommended he come to emergency department, family is not clear why PCP asked he be evaluated in emergency department.    Past Medical History:  Diagnosis Date  . Diabetes mellitus without complication (HCC)   . Hypertension   . Peripheral vascular disease [facility])     Patient Active Problem List   Diagnosis Date Noted  . Toe infection 01/04/2016  . Type 2 diabetes mellitus without complication, without long-term current use of insulin (HCC) 09/24/2015  . Essential hypertension 09/24/2015    Past Surgical History:  Procedure Laterality Date  . AMPUTATION TOE Right 01/15/2016   Procedure: AMPUTATION TOE;  Surgeon: Gwyneth Revels, DPM;  Location: [facility] ORS;  Service: Podiatry;  Laterality: Right;  . PERIPHERAL VASCULAR CATHETERIZATION Right 01/07/2016   Procedure: Abdominal Aortogram w/Lower Extremity;  Surgeon: Renford Dills, MD;  Location: [facility] INVASIVE CV LAB;  Service: Cardiovascular;  Laterality: Right;  . TOE SURGERY     amputated 2 toes on L) foot    Prior to Admission medications   Medication Sig Start Date End Date Taking? Authorizing Provider  amLODipine (NORVASC) 10 MG tablet Take 1 tablet (10 mg total) by mouth daily. 06/17/16   Duanne Limerick, MD  aspirin EC 81 MG tablet Take 1  tablet (81 mg total) by mouth daily. 01/08/16   Shaune Pollack, MD  ferrous sulfate 325 (65 FE) MG tablet Take 1 tablet (325 mg total) by mouth daily with breakfast. 09/24/15   Duanne Limerick, MD  glipiZIDE (GLUCOTROL XL) 10 MG 24 hr tablet Take 1 tablet (10 mg total) by mouth daily with breakfast. 05/02/16   Duanne Limerick, MD  lisinopril (PRINIVIL,ZESTRIL) 5 MG tablet Take 1 tablet (5 mg total) by mouth daily. 06/17/16   Duanne Limerick, MD  loratadine (CLARITIN) 10 MG tablet Take 1 tablet (10 mg total) by mouth daily. 04/12/16   Duanne Limerick, MD  metFORMIN (GLUCOPHAGE) 500 MG tablet Take 1 tablet (500 mg total) by mouth 2 (two) times daily. 05/02/16   Duanne Limerick, MD  metoprolol (LOPRESSOR) 50 MG tablet Take 1 tablet (50 mg total) by mouth 2 (two) times daily. 06/17/16   Duanne Limerick, MD    Allergies Patient has no known allergies.  Family History  Problem Relation Age of Onset  . Diabetes Mother   . Hypertension Mother   . Diabetes Father   . Hypertension Father     Social History Social History  Substance Use Topics  . Smoking status: Never Smoker  . Smokeless tobacco: Never Used  . Alcohol use No    Review of Systems  Constitutional: Negative for fever. Cardiovascular: Negative for chest pain. Respiratory: Negative for shortness of breath. Gastrointestinal: Negative for abdominal pain, vomiting and diarrhea. Neurological: Negative for headaches, focal weakness or numbness.  10-point ROS  otherwise negative.  ____________________________________________   PHYSICAL EXAM:  VITAL SIGNS: ED Triage Vitals  Enc Vitals Group     BP 07/27/16 1453 (!) 219/84     Pulse Rate 07/27/16 1453 85     Resp 07/27/16 1453 18     Temp 07/27/16 1453 97.6 F (36.4 C)     Temp Source 07/27/16 1453 Oral     SpO2 07/27/16 1453 99 %     Weight 07/27/16 1454 150 lb (68 kg)     Height 07/27/16 1454  (1.88 m)    Constitutional: Alert and oriented. Well appearing and in no  distress. Eyes: Conjunctivae are normal. Normal extraocular movements. ENT   Head: Normocephalic and atraumatic.   Nose: No congestion/rhinnorhea.   Mouth/Throat: Mucous membranes are moist.   Neck: No stridor. Hematological/Lymphatic/Immunilogical: No cervical lymphadenopathy. Cardiovascular: Normal rate, regular rhythm.  No murmurs, rubs, or gallops.  Respiratory: Normal respiratory effort without tachypnea nor retractions. Breath sounds are clear and equal bilaterally. No wheezes/rales/rhonchi. Gastrointestinal: Soft and non tender. No rebound. No guarding.  Genitourinary: Deferred Musculoskeletal:  Right BKA. Neurologic:  Normal speech and language. No gross focal neurologic deficits are appreciated.  Skin:  Skin is warm, dry and intact. No rash noted. Psychiatric: Mood and affect are normal. Speech and behavior are normal. Patient exhibits appropriate insight and judgment.  ____________________________________________    LABS (pertinent positives/negatives)  None  ____________________________________________   EKG  None  ____________________________________________    RADIOLOGY  None  ____________________________________________   PROCEDURES  Procedures  ____________________________________________   INITIAL IMPRESSION / ASSESSMENT AND PLAN / ED COURSE  Pertinent labs & imaging results that were available during my care of the patient were reviewed by me and considered in my medical decision making (see chart for details).  Patient with asymptomatic hypertension. History of hypertension. Is followed by PCP. Will have patient follow up with PCP.  ____________________________________________   FINAL CLINICAL IMPRESSION(S) / ED DIAGNOSES  Final diagnoses:  Hypertension, unspecified type     Note: This dictation was prepared with Dragon dictation. Any transcriptional errors that result from this process are unintentional     Phineas Semen, MD 07/27/16 1529

## 2016-07-27 NOTE — ED Triage Notes (Signed)
PAtient presents today for HTN. PT came to his home today and once she checked blood pressure she told patient to come to ER due to HTN. Denies chest pain, SOB, Headache, or any other symptoms in triage. R leg below the knee amputation

## 2016-07-28 ENCOUNTER — Other Ambulatory Visit: Payer: Self-pay

## 2016-08-01 ENCOUNTER — Telehealth: Payer: Self-pay

## 2016-08-01 DIAGNOSIS — E1159 Type 2 diabetes mellitus with other circulatory complications: Secondary | ICD-10-CM | POA: Diagnosis not present

## 2016-08-01 DIAGNOSIS — E1151 Type 2 diabetes mellitus with diabetic peripheral angiopathy without gangrene: Secondary | ICD-10-CM | POA: Diagnosis not present

## 2016-08-01 DIAGNOSIS — I10 Essential (primary) hypertension: Secondary | ICD-10-CM | POA: Diagnosis not present

## 2016-08-01 DIAGNOSIS — M25421 Effusion, right elbow: Secondary | ICD-10-CM | POA: Diagnosis not present

## 2016-08-01 DIAGNOSIS — Z89511 Acquired absence of right leg below knee: Secondary | ICD-10-CM | POA: Diagnosis not present

## 2016-08-01 DIAGNOSIS — Z7982 Long term (current) use of aspirin: Secondary | ICD-10-CM | POA: Diagnosis not present

## 2016-08-01 DIAGNOSIS — Z7984 Long term (current) use of oral hypoglycemic drugs: Secondary | ICD-10-CM | POA: Diagnosis not present

## 2016-08-01 NOTE — Telephone Encounter (Signed)
[name] called asking when they should skip PT. Patient BP elevations have been bad. Went to [facility] over weekend with 200/90. Last few days it has been above 160/80. Advised to have patient call for OV and no PT when it is high until seen.

## 2016-08-05 ENCOUNTER — Ambulatory Visit (INDEPENDENT_AMBULATORY_CARE_PROVIDER_SITE_OTHER): Payer: Medicare Other | Admitting: Family Medicine

## 2016-08-05 ENCOUNTER — Encounter: Payer: Self-pay | Admitting: Family Medicine

## 2016-08-05 VITALS — BP 160/64 | HR 82 | Ht 74.0 in | Wt 150.0 lb

## 2016-08-05 DIAGNOSIS — E119 Type 2 diabetes mellitus without complications: Secondary | ICD-10-CM

## 2016-08-05 DIAGNOSIS — E1159 Type 2 diabetes mellitus with other circulatory complications: Secondary | ICD-10-CM | POA: Diagnosis not present

## 2016-08-05 DIAGNOSIS — I152 Hypertension secondary to endocrine disorders: Secondary | ICD-10-CM

## 2016-08-05 DIAGNOSIS — I10 Essential (primary) hypertension: Secondary | ICD-10-CM

## 2016-08-05 MED ORDER — METFORMIN HCL 500 MG PO TABS: 500.0000 mg | ORAL_TABLET | Freq: Two times a day (BID) | ORAL | 1 refills | Status: DC

## 2016-08-05 MED ORDER — HYDROCHLOROTHIAZIDE 12.5 MG PO TABS: 12.5000 mg | ORAL_TABLET | Freq: Every day | ORAL | 3 refills | Status: DC

## 2016-08-05 MED ORDER — LISINOPRIL 5 MG PO TABS: 5.0000 mg | ORAL_TABLET | Freq: Every day | ORAL | 1 refills | Status: DC

## 2016-08-05 MED ORDER — AMLODIPINE BESYLATE 10 MG PO TABS: 10.0000 mg | ORAL_TABLET | Freq: Every day | ORAL | 1 refills | Status: DC

## 2016-08-05 MED ORDER — METOPROLOL TARTRATE 50 MG PO TABS: 50.0000 mg | ORAL_TABLET | Freq: Two times a day (BID) | ORAL | 1 refills | Status: DC

## 2016-08-05 MED ORDER — GLIPIZIDE ER 10 MG PO TB24: 10.0000 mg | ORAL_TABLET | Freq: Every day | ORAL | 1 refills | Status: DC

## 2016-08-05 NOTE — Progress Notes (Signed)
Name: Jose Schroeder   MRN: [id number]    DOB: 1933/04/08   Date:08/05/2016       Progress Note  Subjective  Chief Complaint  Chief Complaint  Patient presents with  . Hypertension    therapists are coming to house and are still concerned about his B/P- however, they tend to check it after exercise per wife    Hypertension  This is a chronic problem. The current episode started more than 1 month ago. The problem has been waxing and waning since onset. The problem is controlled. Pertinent negatives include no anxiety, blurred vision, chest pain, headaches, malaise/fatigue, neck pain, orthopnea, palpitations, peripheral edema, PND, shortness of breath or sweats. There are no associated agents to hypertension. Risk factors for coronary artery disease include diabetes mellitus, dyslipidemia, obesity and post-menopausal state. There are no compliance problems.  Hypertensive end-organ damage includes PVD. There is no history of angina, kidney disease, CAD/MI, CVA, heart failure, left ventricular hypertrophy or retinopathy.  Diabetes  He presents for his follow-up diabetic visit. He has type 2 diabetes mellitus. His disease course has been stable. Pertinent negatives for hypoglycemia include no dizziness, headaches, nervousness/anxiousness or sweats. Associated symptoms include foot paresthesias. Pertinent negatives for diabetes include no blurred vision, no chest pain, no fatigue, no foot ulcerations, no polydipsia, no polyphagia, no polyuria, no weakness and no weight loss. Symptoms are stable. Diabetic complications include peripheral neuropathy and PVD. Pertinent negatives for diabetic complications include no CVA, heart disease, nephropathy or retinopathy. Risk factors for coronary artery disease include hypertension and diabetes mellitus. Current diabetic treatment includes oral agent (dual therapy). He is following a generally healthy diet. He participates in exercise intermittently. His breakfast  blood glucose is taken between 8-9 am. His breakfast blood glucose range is generally 130-140 mg/dl. An ACE inhibitor/angiotensin II receptor blocker is being taken.    No problem-specific Assessment & Plan notes found for this encounter.   Past Medical History:  Diagnosis Date  . Diabetes mellitus without complication (HCC)   . Hypertension   . Peripheral vascular disease [facility])     Past Surgical History:  Procedure Laterality Date  . AMPUTATION TOE Right 01/15/2016   Procedure: AMPUTATION TOE;  Surgeon: Gwyneth Revels, DPM;  Location: [facility] ORS;  Service: Podiatry;  Laterality: Right;  . PERIPHERAL VASCULAR CATHETERIZATION Right 01/07/2016   Procedure: Abdominal Aortogram w/Lower Extremity;  Surgeon: Renford Dills, MD;  Location: [facility] INVASIVE CV LAB;  Service: Cardiovascular;  Laterality: Right;  . TOE SURGERY     amputated 2 toes on L) foot    Family History  Problem Relation Age of Onset  . Diabetes Mother   . Hypertension Mother   . Diabetes Father   . Hypertension Father     Social History   Social History  . Marital status: Married    Spouse name: N/A  . Number of children: N/A  . Years of education: N/A   Occupational History  . Not on file.   Social History Main Topics  . Smoking status: Never Smoker  . Smokeless tobacco: Never Used  . Alcohol use No  . Drug use: No  . Sexual activity: Not Currently   Other Topics Concern  . Not on file   Social History Narrative  . No narrative on file    No Known Allergies  Outpatient Medications Prior to Visit  Medication Sig Dispense Refill  . aspirin EC 81 MG tablet Take 1 tablet (81 mg total) by mouth daily. 30 tablet  2  . ferrous sulfate 325 (65 FE) MG tablet Take 1 tablet (325 mg total) by mouth daily with breakfast. 30 tablet 5  . loratadine (CLARITIN) 10 MG tablet Take 1 tablet (10 mg total) by mouth daily. 30 tablet 11  . amLODipine (NORVASC) 10 MG tablet Take 1 tablet (10 mg total) by mouth daily. 90  tablet 1  . glipiZIDE (GLUCOTROL XL) 10 MG 24 hr tablet Take 1 tablet (10 mg total) by mouth daily with breakfast. 90 tablet 1  . lisinopril (PRINIVIL,ZESTRIL) 5 MG tablet Take 1 tablet (5 mg total) by mouth daily. 90 tablet 1  . metFORMIN (GLUCOPHAGE) 500 MG tablet Take 1 tablet (500 mg total) by mouth 2 (two) times daily. 180 tablet 1  . metoprolol (LOPRESSOR) 50 MG tablet Take 1 tablet (50 mg total) by mouth 2 (two) times daily. 180 tablet 1   No facility-administered medications prior to visit.     Review of Systems  Constitutional: Negative for chills, fatigue, fever, malaise/fatigue and weight loss.  HENT: Negative for ear discharge, ear pain and sore throat.   Eyes: Negative for blurred vision.  Respiratory: Negative for cough, sputum production, shortness of breath and wheezing.   Cardiovascular: Negative for chest pain, palpitations, orthopnea, leg swelling and PND.  Gastrointestinal: Negative for abdominal pain, blood in stool, constipation, diarrhea, heartburn, melena and nausea.  Genitourinary: Negative for dysuria, frequency, hematuria and urgency.  Musculoskeletal: Negative for back pain, joint pain, myalgias and neck pain.  Skin: Negative for rash.  Neurological: Negative for dizziness, tingling, sensory change, focal weakness, weakness and headaches.  Endo/Heme/Allergies: Negative for environmental allergies, polydipsia and polyphagia. Does not bruise/bleed easily.  Psychiatric/Behavioral: Negative for depression and suicidal ideas. The patient is not nervous/anxious and does not have insomnia.      Objective  Vitals:   08/05/16 1418  BP: (!) 160/64  Pulse: 82  Weight: 150 lb (68 kg)  Height:  (1.88 m)    Physical Exam  Constitutional: He is oriented to person, place, and time and well-developed, well-nourished, and in no distress.  HENT:  Head: Normocephalic.  Right Ear: External ear normal.  Left Ear: External ear normal.  Nose: Nose normal.   Mouth/Throat: Oropharynx is clear and moist.  Eyes: Conjunctivae and EOM are normal. Pupils are equal, round, and reactive to light. Right eye exhibits no discharge. Left eye exhibits no discharge. No scleral icterus.  Neck: Normal range of motion. Neck supple. No JVD present. No tracheal deviation present. No thyromegaly present.  Cardiovascular: Normal rate, regular rhythm, normal heart sounds and intact distal pulses.  Exam reveals no gallop and no friction rub.   No murmur heard. Pulmonary/Chest: Breath sounds normal. No respiratory distress. He has no wheezes. He has no rales.  Abdominal: Soft. Bowel sounds are normal. He exhibits no mass. There is no hepatosplenomegaly. There is no tenderness. There is no rebound, no guarding and no CVA tenderness.  Musculoskeletal: Normal range of motion. He exhibits no edema or tenderness.  Lymphadenopathy:    He has no cervical adenopathy.  Neurological: He is alert and oriented to person, place, and time. He has normal sensation, normal strength and intact cranial nerves. No cranial nerve deficit.  Skin: Skin is warm. No rash noted.  Psychiatric: Mood and affect normal.  Nursing note and vitals reviewed.     Assessment & Plan  Problem List Items Addressed This Visit      Cardiovascular and Mediastinum   Essential hypertension - Primary  Relevant Medications   amLODipine (NORVASC) 10 MG tablet   lisinopril (PRINIVIL,ZESTRIL) 5 MG tablet   metoprolol (LOPRESSOR) 50 MG tablet   hydrochlorothiazide (HYDRODIURIL) 12.5 MG tablet   Other Relevant Orders   Renal Function Panel     Endocrine   Type 2 diabetes mellitus without complication, without long-term current use of insulin (HCC)   Relevant Medications   glipiZIDE (GLUCOTROL XL) 10 MG 24 hr tablet   metFORMIN (GLUCOPHAGE) 500 MG tablet   lisinopril (PRINIVIL,ZESTRIL) 5 MG tablet   Other Relevant Orders   Hemoglobin A1c    Other Visit Diagnoses    Hypertension associated with  diabetes (HCC)       Relevant Medications   glipiZIDE (GLUCOTROL XL) 10 MG 24 hr tablet   metFORMIN (GLUCOPHAGE) 500 MG tablet   amLODipine (NORVASC) 10 MG tablet   lisinopril (PRINIVIL,ZESTRIL) 5 MG tablet   metoprolol (LOPRESSOR) 50 MG tablet   hydrochlorothiazide (HYDRODIURIL) 12.5 MG tablet   Other Relevant Orders   Lipid Profile      Meds ordered this encounter  Medications  . glipiZIDE (GLUCOTROL XL) 10 MG 24 hr tablet    Sig: Take 1 tablet (10 mg total) by mouth daily with breakfast.    Dispense:  90 tablet    Refill:  1  . metFORMIN (GLUCOPHAGE) 500 MG tablet    Sig: Take 1 tablet (500 mg total) by mouth 2 (two) times daily.    Dispense:  180 tablet    Refill:  1  . amLODipine (NORVASC) 10 MG tablet    Sig: Take 1 tablet (10 mg total) by mouth daily.    Dispense:  90 tablet    Refill:  1  . lisinopril (PRINIVIL,ZESTRIL) 5 MG tablet    Sig: Take 1 tablet (5 mg total) by mouth daily.    Dispense:  90 tablet    Refill:  1  . metoprolol (LOPRESSOR) 50 MG tablet    Sig: Take 1 tablet (50 mg total) by mouth 2 (two) times daily.    Dispense:  180 tablet    Refill:  1    sched appt- not seen in 9 months  . hydrochlorothiazide (HYDRODIURIL) 12.5 MG tablet    Sig: Take 1 tablet (12.5 mg total) by mouth daily.    Dispense:  90 tablet    Refill:  3      Dr. Elizabeth Sauer [facility]  08/05/16

## 2016-08-06 LAB — RENAL FUNCTION PANEL
Albumin: 4.7 g/dL (ref 3.5–4.7)
BUN/Creatinine Ratio: 11 (ref 10–24)
BUN: 10 mg/dL (ref 8–27)
CO2: 27 mmol/L (ref 18–29)
Calcium: 9.8 mg/dL (ref 8.6–10.2)
Chloride: 96 mmol/L (ref 96–106)
Creatinine, Ser: 0.91 mg/dL (ref 0.76–1.27)
GFR calc Af Amer: 91 mL/min/{1.73_m2} (ref >59)
GFR calc non Af Amer: 79 mL/min/{1.73_m2} (ref >59)
Glucose: 344 mg/dL — ABNORMAL HIGH (ref 65–99)
Phosphorus: 3.6 mg/dL (ref 2.5–4.5)
Potassium: 4.6 mmol/L (ref 3.5–5.2)
Sodium: 138 mmol/L (ref 134–144)

## 2016-08-06 LAB — LIPID PANEL
Chol/HDL Ratio: 3.1 ratio (ref 0.0–5.0)
Cholesterol, Total: 174 mg/dL (ref 100–199)
HDL: 57 mg/dL (ref >39)
LDL Calculated: 103 mg/dL — ABNORMAL HIGH (ref 0–99)
Triglycerides: 69 mg/dL (ref 0–149)
VLDL Cholesterol Cal: 14 mg/dL (ref 5–40)

## 2016-08-06 LAB — HEMOGLOBIN A1C
Est. average glucose Bld gHb Est-mCnc: 189 mg/dL
Hgb A1c MFr Bld: 8.2 % — ABNORMAL HIGH (ref 4.8–5.6)

## 2016-08-08 ENCOUNTER — Telehealth: Payer: Self-pay

## 2016-08-08 ENCOUNTER — Other Ambulatory Visit: Payer: Self-pay

## 2016-08-08 NOTE — Telephone Encounter (Signed)
Patient was asked to be seen and did come in on 08/05/2016 but Home Health is calling again asking what range BP they should be between before canceling PT. BP elevated last week a few times and they need your orders for when to cancel PT. When they asked last time we instructed OV first.

## 2016-08-08 NOTE — Telephone Encounter (Signed)
It was not high they just have to have a cut off if it is high for when to STOP PT.

## 2016-08-08 NOTE — Telephone Encounter (Signed)
Dr Yetta Barre says to tell the PT that "we will just send him to cardiology, when pt comes here his B/P is normal. They check it and it seems to be high."

## 2016-08-09 DIAGNOSIS — M25421 Effusion, right elbow: Secondary | ICD-10-CM | POA: Diagnosis not present

## 2016-08-09 DIAGNOSIS — Z7982 Long term (current) use of aspirin: Secondary | ICD-10-CM | POA: Diagnosis not present

## 2016-08-09 DIAGNOSIS — I10 Essential (primary) hypertension: Secondary | ICD-10-CM | POA: Diagnosis not present

## 2016-08-09 DIAGNOSIS — Z7984 Long term (current) use of oral hypoglycemic drugs: Secondary | ICD-10-CM | POA: Diagnosis not present

## 2016-08-09 DIAGNOSIS — Z89511 Acquired absence of right leg below knee: Secondary | ICD-10-CM | POA: Diagnosis not present

## 2016-08-09 DIAGNOSIS — E1151 Type 2 diabetes mellitus with diabetic peripheral angiopathy without gangrene: Secondary | ICD-10-CM | POA: Diagnosis not present

## 2016-08-09 DIAGNOSIS — E1159 Type 2 diabetes mellitus with other circulatory complications: Secondary | ICD-10-CM | POA: Diagnosis not present

## 2016-08-09 NOTE — Telephone Encounter (Signed)
The problem is they keep going out there and his B/P goes up- they get him and his wife all worked up and tell him to go to the ER. By the time he gets to ER, B/P is down. Dr Yetta Barre sees him in clinic- B/P is fine. So, we have been going through this process for a month at least. Dr Yetta Barre doesn't know what else to do because it's normal when we check it. I think he just gets nervous about therapy, which could be baseline when he's worrying about how he will do with his prosthetic leg

## 2016-08-09 NOTE — Telephone Encounter (Signed)
ADVISED 180/110

## 2016-08-11 DIAGNOSIS — Z89511 Acquired absence of right leg below knee: Secondary | ICD-10-CM | POA: Diagnosis not present

## 2016-08-11 DIAGNOSIS — I10 Essential (primary) hypertension: Secondary | ICD-10-CM | POA: Diagnosis not present

## 2016-08-11 DIAGNOSIS — M25421 Effusion, right elbow: Secondary | ICD-10-CM | POA: Diagnosis not present

## 2016-08-11 DIAGNOSIS — Z7984 Long term (current) use of oral hypoglycemic drugs: Secondary | ICD-10-CM | POA: Diagnosis not present

## 2016-08-11 DIAGNOSIS — E1159 Type 2 diabetes mellitus with other circulatory complications: Secondary | ICD-10-CM | POA: Diagnosis not present

## 2016-08-11 DIAGNOSIS — Z7982 Long term (current) use of aspirin: Secondary | ICD-10-CM | POA: Diagnosis not present

## 2016-08-11 DIAGNOSIS — E1151 Type 2 diabetes mellitus with diabetic peripheral angiopathy without gangrene: Secondary | ICD-10-CM | POA: Diagnosis not present

## 2016-08-15 DIAGNOSIS — M25421 Effusion, right elbow: Secondary | ICD-10-CM | POA: Diagnosis not present

## 2016-08-15 DIAGNOSIS — E1159 Type 2 diabetes mellitus with other circulatory complications: Secondary | ICD-10-CM | POA: Diagnosis not present

## 2016-08-15 DIAGNOSIS — Z7982 Long term (current) use of aspirin: Secondary | ICD-10-CM | POA: Diagnosis not present

## 2016-08-15 DIAGNOSIS — Z89511 Acquired absence of right leg below knee: Secondary | ICD-10-CM | POA: Diagnosis not present

## 2016-08-15 DIAGNOSIS — Z7984 Long term (current) use of oral hypoglycemic drugs: Secondary | ICD-10-CM | POA: Diagnosis not present

## 2016-08-15 DIAGNOSIS — I10 Essential (primary) hypertension: Secondary | ICD-10-CM | POA: Diagnosis not present

## 2016-08-15 DIAGNOSIS — E1151 Type 2 diabetes mellitus with diabetic peripheral angiopathy without gangrene: Secondary | ICD-10-CM | POA: Diagnosis not present

## 2016-08-19 DIAGNOSIS — E1159 Type 2 diabetes mellitus with other circulatory complications: Secondary | ICD-10-CM | POA: Diagnosis not present

## 2016-08-19 DIAGNOSIS — M25421 Effusion, right elbow: Secondary | ICD-10-CM | POA: Diagnosis not present

## 2016-08-19 DIAGNOSIS — Z7982 Long term (current) use of aspirin: Secondary | ICD-10-CM | POA: Diagnosis not present

## 2016-08-19 DIAGNOSIS — Z7984 Long term (current) use of oral hypoglycemic drugs: Secondary | ICD-10-CM | POA: Diagnosis not present

## 2016-08-19 DIAGNOSIS — Z89511 Acquired absence of right leg below knee: Secondary | ICD-10-CM | POA: Diagnosis not present

## 2016-08-19 DIAGNOSIS — I10 Essential (primary) hypertension: Secondary | ICD-10-CM | POA: Diagnosis not present

## 2016-08-19 DIAGNOSIS — E1151 Type 2 diabetes mellitus with diabetic peripheral angiopathy without gangrene: Secondary | ICD-10-CM | POA: Diagnosis not present

## 2016-08-24 DIAGNOSIS — Z89511 Acquired absence of right leg below knee: Secondary | ICD-10-CM | POA: Diagnosis not present

## 2016-08-24 DIAGNOSIS — Z7984 Long term (current) use of oral hypoglycemic drugs: Secondary | ICD-10-CM | POA: Diagnosis not present

## 2016-08-24 DIAGNOSIS — Z7982 Long term (current) use of aspirin: Secondary | ICD-10-CM | POA: Diagnosis not present

## 2016-08-24 DIAGNOSIS — M25421 Effusion, right elbow: Secondary | ICD-10-CM | POA: Diagnosis not present

## 2016-08-24 DIAGNOSIS — E1151 Type 2 diabetes mellitus with diabetic peripheral angiopathy without gangrene: Secondary | ICD-10-CM | POA: Diagnosis not present

## 2016-08-24 DIAGNOSIS — I10 Essential (primary) hypertension: Secondary | ICD-10-CM | POA: Diagnosis not present

## 2016-08-24 DIAGNOSIS — E1159 Type 2 diabetes mellitus with other circulatory complications: Secondary | ICD-10-CM | POA: Diagnosis not present

## 2016-08-29 DIAGNOSIS — I10 Essential (primary) hypertension: Secondary | ICD-10-CM | POA: Diagnosis not present

## 2016-08-29 DIAGNOSIS — Z7984 Long term (current) use of oral hypoglycemic drugs: Secondary | ICD-10-CM | POA: Diagnosis not present

## 2016-08-29 DIAGNOSIS — E1159 Type 2 diabetes mellitus with other circulatory complications: Secondary | ICD-10-CM | POA: Diagnosis not present

## 2016-08-29 DIAGNOSIS — Z7982 Long term (current) use of aspirin: Secondary | ICD-10-CM | POA: Diagnosis not present

## 2016-08-29 DIAGNOSIS — M25421 Effusion, right elbow: Secondary | ICD-10-CM | POA: Diagnosis not present

## 2016-08-29 DIAGNOSIS — Z89511 Acquired absence of right leg below knee: Secondary | ICD-10-CM | POA: Diagnosis not present

## 2016-08-29 DIAGNOSIS — E1151 Type 2 diabetes mellitus with diabetic peripheral angiopathy without gangrene: Secondary | ICD-10-CM | POA: Diagnosis not present

## 2016-09-02 ENCOUNTER — Encounter: Payer: Self-pay | Admitting: Family Medicine

## 2016-09-02 ENCOUNTER — Ambulatory Visit (INDEPENDENT_AMBULATORY_CARE_PROVIDER_SITE_OTHER): Payer: Medicare Other | Admitting: Family Medicine

## 2016-09-02 VITALS — BP 138/80 | HR 64

## 2016-09-02 DIAGNOSIS — I10 Essential (primary) hypertension: Secondary | ICD-10-CM

## 2016-09-02 DIAGNOSIS — E119 Type 2 diabetes mellitus without complications: Secondary | ICD-10-CM | POA: Diagnosis not present

## 2016-09-02 NOTE — Progress Notes (Signed)
Name: Jose Schroeder   MRN: [id number]    DOB: 1933-02-18   Date:09/02/2016       Progress Note  Subjective  Chief Complaint  Chief Complaint  Patient presents with  . Hypertension  . Diabetes    recheck A1C?    Hypertension  This is a chronic problem. The current episode started more than 1 year ago. The problem has been waxing and waning since onset. The problem is controlled. Pertinent negatives include no anxiety, blurred vision, chest pain, headaches, malaise/fatigue, neck pain, orthopnea, palpitations, peripheral edema, PND, shortness of breath or sweats. There are no associated agents to hypertension. There are no known risk factors for coronary artery disease. Past treatments include ACE inhibitors, calcium channel blockers, beta blockers and diuretics. There are no compliance problems.  There is no history of angina, kidney disease, CAD/MI, CVA, heart failure, left ventricular hypertrophy, PVD or retinopathy. There is no history of chronic renal disease, a hypertension causing med or renovascular disease.  Diabetes  He presents for his follow-up (recent stopped Actos) diabetic visit. He has type 2 diabetes mellitus. Pertinent negatives for hypoglycemia include no confusion, dizziness, headaches, hunger, mood changes, nervousness/anxiousness or sweats. Pertinent negatives for diabetes include no blurred vision, no chest pain, no fatigue, no foot paresthesias, no foot ulcerations, no polydipsia, no polyphagia, no polyuria, no visual change, no weakness and no weight loss. There are no hypoglycemic complications. Symptoms are stable. There are no diabetic complications. Pertinent negatives for diabetic complications include no CVA, PVD or retinopathy. Risk factors for coronary artery disease include diabetes mellitus and hypertension. Current diabetic treatment includes oral agent (dual therapy). His breakfast blood glucose is taken between 8-9 am. His breakfast blood glucose range is  generally 180-200 mg/dl.    No problem-specific Assessment & Plan notes found for this encounter.   Past Medical History:  Diagnosis Date  . Diabetes mellitus without complication (HCC)   . Hypertension   . Peripheral vascular disease [facility])     Past Surgical History:  Procedure Laterality Date  . AMPUTATION TOE Right 01/15/2016   Procedure: AMPUTATION TOE;  Surgeon: Gwyneth Revels, DPM;  Location: [facility] ORS;  Service: Podiatry;  Laterality: Right;  . PERIPHERAL VASCULAR CATHETERIZATION Right 01/07/2016   Procedure: Abdominal Aortogram w/Lower Extremity;  Surgeon: Renford Dills, MD;  Location: [facility] INVASIVE CV LAB;  Service: Cardiovascular;  Laterality: Right;  . TOE SURGERY     amputated 2 toes on L) foot    Family History  Problem Relation Age of Onset  . Diabetes Mother   . Hypertension Mother   . Diabetes Father   . Hypertension Father     Social History   Social History  . Marital status: Married    Spouse name: N/A  . Number of children: N/A  . Years of education: N/A   Occupational History  . Not on file.   Social History Main Topics  . Smoking status: Never Smoker  . Smokeless tobacco: Never Used  . Alcohol use No  . Drug use: No  . Sexual activity: Not Currently   Other Topics Concern  . Not on file   Social History Narrative  . No narrative on file    No Known Allergies  Outpatient Medications Prior to Visit  Medication Sig Dispense Refill  . amLODipine (NORVASC) 10 MG tablet Take 1 tablet (10 mg total) by mouth daily. 90 tablet 1  . aspirin EC 81 MG tablet Take 1 tablet (81 mg total) by mouth  daily. 30 tablet 2  . ferrous sulfate 325 (65 FE) MG tablet Take 1 tablet (325 mg total) by mouth daily with breakfast. 30 tablet 5  . glipiZIDE (GLUCOTROL XL) 10 MG 24 hr tablet Take 1 tablet (10 mg total) by mouth daily with breakfast. 90 tablet 1  . hydrochlorothiazide (HYDRODIURIL) 12.5 MG tablet Take 1 tablet (12.5 mg total) by mouth daily. 90 tablet  3  . lisinopril (PRINIVIL,ZESTRIL) 5 MG tablet Take 1 tablet (5 mg total) by mouth daily. 90 tablet 1  . metFORMIN (GLUCOPHAGE) 500 MG tablet Take 1 tablet (500 mg total) by mouth 2 (two) times daily. 180 tablet 1  . metoprolol (LOPRESSOR) 50 MG tablet Take 1 tablet (50 mg total) by mouth 2 (two) times daily. 180 tablet 1  . loratadine (CLARITIN) 10 MG tablet Take 1 tablet (10 mg total) by mouth daily. 30 tablet 11   No facility-administered medications prior to visit.     Review of Systems  Constitutional: Negative for chills, fatigue, fever, malaise/fatigue and weight loss.  HENT: Negative for ear discharge, ear pain and sore throat.   Eyes: Negative for blurred vision.  Respiratory: Negative for cough, sputum production, shortness of breath and wheezing.   Cardiovascular: Negative for chest pain, palpitations, orthopnea, leg swelling and PND.  Gastrointestinal: Negative for abdominal pain, blood in stool, constipation, diarrhea, heartburn, melena and nausea.  Genitourinary: Negative for dysuria, frequency, hematuria and urgency.  Musculoskeletal: Negative for back pain, joint pain, myalgias and neck pain.  Skin: Negative for rash.  Neurological: Negative for dizziness, tingling, sensory change, focal weakness, weakness and headaches.  Endo/Heme/Allergies: Negative for environmental allergies, polydipsia and polyphagia. Does not bruise/bleed easily.  Psychiatric/Behavioral: Negative for confusion, depression and suicidal ideas. The patient is not nervous/anxious and does not have insomnia.      Objective  Vitals:   09/02/16 1341  BP: 138/80  Pulse: 64    Physical Exam  Constitutional: He is oriented to person, place, and time and well-developed, well-nourished, and in no distress.  HENT:  Head: Normocephalic.  Right Ear: External ear normal.  Left Ear: External ear normal.  Nose: Nose normal.  Mouth/Throat: Oropharynx is clear and moist.  Eyes: Conjunctivae and EOM are  normal. Pupils are equal, round, and reactive to light. Right eye exhibits no discharge. Left eye exhibits no discharge. No scleral icterus.  Neck: Normal range of motion. Neck supple. No JVD present. No tracheal deviation present. No thyromegaly present.  Cardiovascular: Normal rate, regular rhythm, normal heart sounds and intact distal pulses.  Exam reveals no gallop and no friction rub.   No murmur heard. Pulmonary/Chest: Breath sounds normal. No respiratory distress. He has no wheezes. He has no rales.  Abdominal: Soft. Bowel sounds are normal. He exhibits no mass. There is no hepatosplenomegaly. There is no tenderness. There is no rebound, no guarding and no CVA tenderness.  Musculoskeletal: Normal range of motion. He exhibits no edema or tenderness.  Lymphadenopathy:    He has no cervical adenopathy.  Neurological: He is alert and oriented to person, place, and time. He has normal sensation, normal strength, normal reflexes and intact cranial nerves. No cranial nerve deficit.  Skin: Skin is warm. No rash noted.  Psychiatric: Mood and affect normal.  Nursing note and vitals reviewed.     Assessment & Plan  Problem List Items Addressed This Visit      Cardiovascular and Mediastinum   Essential hypertension - Primary   Relevant Orders   Renal Function  Panel     Endocrine   Type 2 diabetes mellitus without complication, without long-term current use of insulin (HCC)   Relevant Orders   Hemoglobin A1c      No orders of the defined types were placed in this encounter.     Dr. Hayden Rasmusseneanna Jones [facility]  09/02/16

## 2016-09-03 LAB — RENAL FUNCTION PANEL
Albumin: 4.6 g/dL (ref 3.5–4.7)
BUN/Creatinine Ratio: 9 — ABNORMAL LOW (ref 10–24)
BUN: 9 mg/dL (ref 8–27)
CO2: 23 mmol/L (ref 18–29)
Calcium: 9.9 mg/dL (ref 8.6–10.2)
Chloride: 96 mmol/L (ref 96–106)
Creatinine, Ser: 1.02 mg/dL (ref 0.76–1.27)
GFR calc Af Amer: 79 mL/min/{1.73_m2} (ref >59)
GFR calc non Af Amer: 68 mL/min/{1.73_m2} (ref >59)
Glucose: 476 mg/dL — ABNORMAL HIGH (ref 65–99)
Phosphorus: 3.5 mg/dL (ref 2.5–4.5)
Potassium: 5.2 mmol/L (ref 3.5–5.2)
Sodium: 136 mmol/L (ref 134–144)

## 2016-09-03 LAB — HEMOGLOBIN A1C
Est. average glucose Bld gHb Est-mCnc: 235 mg/dL
Hgb A1c MFr Bld: 9.8 % — ABNORMAL HIGH (ref 4.8–5.6)

## 2016-09-05 ENCOUNTER — Other Ambulatory Visit: Payer: Self-pay

## 2016-09-05 DIAGNOSIS — E1165 Type 2 diabetes mellitus with hyperglycemia: Principal | ICD-10-CM

## 2016-09-05 DIAGNOSIS — E1159 Type 2 diabetes mellitus with other circulatory complications: Secondary | ICD-10-CM

## 2016-09-05 DIAGNOSIS — IMO0002 Reserved for concepts with insufficient information to code with codable children: Secondary | ICD-10-CM

## 2016-09-08 DIAGNOSIS — Z89511 Acquired absence of right leg below knee: Secondary | ICD-10-CM | POA: Diagnosis not present

## 2016-09-08 DIAGNOSIS — Z7984 Long term (current) use of oral hypoglycemic drugs: Secondary | ICD-10-CM | POA: Diagnosis not present

## 2016-09-08 DIAGNOSIS — E1151 Type 2 diabetes mellitus with diabetic peripheral angiopathy without gangrene: Secondary | ICD-10-CM | POA: Diagnosis not present

## 2016-09-08 DIAGNOSIS — I10 Essential (primary) hypertension: Secondary | ICD-10-CM | POA: Diagnosis not present

## 2016-09-08 DIAGNOSIS — E1159 Type 2 diabetes mellitus with other circulatory complications: Secondary | ICD-10-CM | POA: Diagnosis not present

## 2016-09-08 DIAGNOSIS — Z7982 Long term (current) use of aspirin: Secondary | ICD-10-CM | POA: Diagnosis not present

## 2016-09-08 DIAGNOSIS — M25421 Effusion, right elbow: Secondary | ICD-10-CM | POA: Diagnosis not present

## 2016-09-12 ENCOUNTER — Other Ambulatory Visit: Payer: Self-pay

## 2016-09-15 ENCOUNTER — Other Ambulatory Visit: Payer: Self-pay | Admitting: Family Medicine

## 2016-09-15 DIAGNOSIS — E119 Type 2 diabetes mellitus without complications: Secondary | ICD-10-CM

## 2016-11-11 ENCOUNTER — Other Ambulatory Visit: Payer: Self-pay

## 2016-11-11 ENCOUNTER — Telehealth: Payer: Self-pay

## 2016-11-11 DIAGNOSIS — E119 Type 2 diabetes mellitus without complications: Secondary | ICD-10-CM

## 2016-11-11 DIAGNOSIS — E1159 Type 2 diabetes mellitus with other circulatory complications: Secondary | ICD-10-CM

## 2016-11-11 DIAGNOSIS — I10 Essential (primary) hypertension: Secondary | ICD-10-CM

## 2016-11-11 MED ORDER — METFORMIN HCL 500 MG PO TABS: 500.0000 mg | ORAL_TABLET | Freq: Two times a day (BID) | ORAL | 1 refills | Status: DC

## 2016-11-11 MED ORDER — AMLODIPINE BESYLATE 10 MG PO TABS: 10.0000 mg | ORAL_TABLET | Freq: Every day | ORAL | 1 refills | Status: DC

## 2016-11-11 MED ORDER — HYDROCHLOROTHIAZIDE 12.5 MG PO TABS: 12.5000 mg | ORAL_TABLET | Freq: Every day | ORAL | 1 refills | Status: DC

## 2016-11-11 MED ORDER — LISINOPRIL 5 MG PO TABS: 5.0000 mg | ORAL_TABLET | Freq: Every day | ORAL | 1 refills | Status: DC

## 2016-11-11 MED ORDER — GLIPIZIDE ER 10 MG PO TB24: 10.0000 mg | ORAL_TABLET | Freq: Every day | ORAL | 1 refills | Status: DC

## 2016-11-11 MED ORDER — METOPROLOL TARTRATE 50 MG PO TABS: 50.0000 mg | ORAL_TABLET | Freq: Two times a day (BID) | ORAL | 1 refills | Status: DC

## 2016-11-11 NOTE — Telephone Encounter (Signed)
Pt's wife called wanting maintenance meds sent to [facility]- sent all in for 90 days with 1 refill. Pt had been off of Amlodipine, added that back into regimen

## 2016-12-15 ENCOUNTER — Encounter: Payer: Self-pay | Admitting: Family Medicine

## 2016-12-15 ENCOUNTER — Ambulatory Visit (INDEPENDENT_AMBULATORY_CARE_PROVIDER_SITE_OTHER): Payer: Medicare Other | Admitting: Family Medicine

## 2016-12-15 VITALS — BP 146/88 | HR 84 | Ht 74.0 in | Wt 156.0 lb

## 2016-12-15 DIAGNOSIS — I10 Essential (primary) hypertension: Secondary | ICD-10-CM | POA: Diagnosis not present

## 2016-12-15 DIAGNOSIS — E785 Hyperlipidemia, unspecified: Secondary | ICD-10-CM | POA: Diagnosis not present

## 2016-12-15 DIAGNOSIS — E1159 Type 2 diabetes mellitus with other circulatory complications: Secondary | ICD-10-CM

## 2016-12-15 DIAGNOSIS — Z23 Encounter for immunization: Secondary | ICD-10-CM | POA: Diagnosis not present

## 2016-12-15 DIAGNOSIS — E119 Type 2 diabetes mellitus without complications: Secondary | ICD-10-CM | POA: Diagnosis not present

## 2016-12-15 DIAGNOSIS — I739 Peripheral vascular disease, unspecified: Secondary | ICD-10-CM | POA: Diagnosis not present

## 2016-12-15 MED ORDER — HYDROCHLOROTHIAZIDE 12.5 MG PO TABS: 12.5000 mg | ORAL_TABLET | Freq: Every day | ORAL | 1 refills | Status: DC

## 2016-12-15 MED ORDER — METOPROLOL TARTRATE 50 MG PO TABS: 50.0000 mg | ORAL_TABLET | Freq: Two times a day (BID) | ORAL | 1 refills | Status: DC

## 2016-12-15 MED ORDER — METFORMIN HCL 500 MG PO TABS: 500.0000 mg | ORAL_TABLET | Freq: Two times a day (BID) | ORAL | 1 refills | Status: DC

## 2016-12-15 MED ORDER — LISINOPRIL 5 MG PO TABS: 5.0000 mg | ORAL_TABLET | Freq: Every day | ORAL | 1 refills | Status: DC

## 2016-12-15 MED ORDER — AMLODIPINE BESYLATE 10 MG PO TABS: 10.0000 mg | ORAL_TABLET | Freq: Every day | ORAL | 1 refills | Status: DC

## 2016-12-15 MED ORDER — GLIPIZIDE ER 10 MG PO TB24: 10.0000 mg | ORAL_TABLET | Freq: Every day | ORAL | 1 refills | Status: DC

## 2016-12-15 NOTE — Progress Notes (Signed)
Name: Jose Schroeder   MRN: [id number]    DOB: December 13, 1932   Date:12/15/2016       Progress Note  Subjective  Chief Complaint  Chief Complaint  Patient presents with  . Hypertension  . Diabetes    Hypertension  This is a chronic problem. The current episode started more than 1 year ago. The problem is unchanged. The problem is controlled. Pertinent negatives include no anxiety, blurred vision, chest pain, headaches, malaise/fatigue, neck pain, orthopnea, palpitations, peripheral edema, PND, shortness of breath or sweats. There are no associated agents to hypertension. There are no known risk factors for coronary artery disease. Past treatments include ACE inhibitors, beta blockers, calcium channel blockers and diuretics. The current treatment provides moderate improvement. There are no compliance problems.  Hypertensive end-organ damage includes PVD. There is no history of angina, kidney disease, CAD/MI, CVA, heart failure, left ventricular hypertrophy or retinopathy. There is no history of chronic renal disease, a hypertension causing med or renovascular disease.  Diabetes  He presents for his follow-up diabetic visit. He has type 2 diabetes mellitus. His disease course has been stable. Pertinent negatives for hypoglycemia include no confusion, dizziness, headaches, hunger, mood changes, nervousness/anxiousness, pallor, sleepiness, speech difficulty, sweats or tremors. Associated symptoms include polyuria. Pertinent negatives for diabetes include no blurred vision, no chest pain, no fatigue, no foot paresthesias, no foot ulcerations, no polydipsia, no polyphagia, no visual change, no weakness and no weight loss. There are no hypoglycemic complications. Symptoms are stable. Diabetic complications include peripheral neuropathy and PVD. Pertinent negatives for diabetic complications include no CVA or retinopathy. Risk factors for coronary artery disease include dyslipidemia and hypertension. Current  diabetic treatment includes oral agent (dual therapy). He is compliant with treatment all of the time. His weight is stable. He is following a generally healthy diet. He participates in exercise intermittently. His breakfast blood glucose is taken between 8-9 am. His breakfast blood glucose range is generally 110-130 mg/dl. An ACE inhibitor/angiotensin II receptor blocker is being taken. He sees a podiatrist.Eye exam is not current.  Hyperlipidemia  This is a chronic problem. The problem is controlled. Recent lipid tests were reviewed and are normal. He has no history of chronic renal disease. Pertinent negatives include no chest pain, focal weakness, myalgias or shortness of breath. He is currently on no antihyperlipidemic treatment. The current treatment provides moderate improvement of lipids. There are no compliance problems.  Risk factors for coronary artery disease include diabetes mellitus, hypertension, dyslipidemia and male sex.    No problem-specific Assessment & Plan notes found for this encounter.   Past Medical History:  Diagnosis Date  . Diabetes mellitus without complication (HCC)   . Hypertension   . Peripheral vascular disease [facility])     Past Surgical History:  Procedure Laterality Date  . AMPUTATION TOE Right 01/15/2016   Procedure: AMPUTATION TOE;  Surgeon: Gwyneth Revels, DPM;  Location: [facility] ORS;  Service: Podiatry;  Laterality: Right;  . PERIPHERAL VASCULAR CATHETERIZATION Right 01/07/2016   Procedure: Abdominal Aortogram w/Lower Extremity;  Surgeon: Renford Dills, MD;  Location: [facility] INVASIVE CV LAB;  Service: Cardiovascular;  Laterality: Right;  . TOE SURGERY     amputated 2 toes on L) foot    Family History  Problem Relation Age of Onset  . Diabetes Mother   . Hypertension Mother   . Diabetes Father   . Hypertension Father     Social History   Social History  . Marital status: Married    Spouse name:  N/A  . Number of children: N/A  . Years of education:  N/A   Occupational History  . Not on file.   Social History Main Topics  . Smoking status: Never Smoker  . Smokeless tobacco: Never Used  . Alcohol use No  . Drug use: No  . Sexual activity: Not Currently   Other Topics Concern  . Not on file   Social History Narrative  . No narrative on file    No Known Allergies  Outpatient Medications Prior to Visit  Medication Sig Dispense Refill  . aspirin EC 81 MG tablet Take 1 tablet (81 mg total) by mouth daily. 30 tablet 2  . ferrous sulfate 325 (65 FE) MG tablet Take 1 tablet (325 mg total) by mouth daily with breakfast. 30 tablet 5  . amLODipine (NORVASC) 10 MG tablet Take 1 tablet (10 mg total) by mouth daily. 90 tablet 1  . glipiZIDE (GLUCOTROL XL) 10 MG 24 hr tablet TAKE 1 TABLET BY MOUTH  DAILY WITH BREAKFAST 90 tablet 1  . hydrochlorothiazide (HYDRODIURIL) 12.5 MG tablet Take 1 tablet (12.5 mg total) by mouth daily. 90 tablet 1  . lisinopril (PRINIVIL,ZESTRIL) 5 MG tablet Take 1 tablet (5 mg total) by mouth daily. 90 tablet 1  . metFORMIN (GLUCOPHAGE) 500 MG tablet TAKE 1 TABLET BY MOUTH TWO  TIMES DAILY 180 tablet 1  . metoprolol tartrate (LOPRESSOR) 50 MG tablet Take 1 tablet (50 mg total) by mouth 2 (two) times daily. 180 tablet 1  . glipiZIDE (GLUCOTROL XL) 10 MG 24 hr tablet Take 1 tablet (10 mg total) by mouth daily with breakfast. 90 tablet 1  . metFORMIN (GLUCOPHAGE) 500 MG tablet Take 1 tablet (500 mg total) by mouth 2 (two) times daily. 180 tablet 1   No facility-administered medications prior to visit.     Review of Systems  Constitutional: Negative for chills, fatigue, fever, malaise/fatigue and weight loss.  HENT: Negative for ear discharge, ear pain and sore throat.   Eyes: Negative for blurred vision.  Respiratory: Negative for cough, sputum production, shortness of breath and wheezing.   Cardiovascular: Negative for chest pain, palpitations, orthopnea, leg swelling and PND.  Gastrointestinal: Negative for  abdominal pain, blood in stool, constipation, diarrhea, heartburn, melena and nausea.  Genitourinary: Negative for dysuria, frequency, hematuria and urgency.  Musculoskeletal: Negative for back pain, joint pain, myalgias and neck pain.  Skin: Negative for pallor and rash.  Neurological: Negative for dizziness, tingling, tremors, sensory change, focal weakness, speech difficulty, weakness and headaches.  Endo/Heme/Allergies: Negative for environmental allergies, polydipsia and polyphagia. Does not bruise/bleed easily.  Psychiatric/Behavioral: Negative for confusion, depression and suicidal ideas. The patient is not nervous/anxious and does not have insomnia.      Objective  Vitals:   12/15/16 1344  BP: (!) 146/88  Pulse: 84  Weight: 156 lb (70.8 kg)  Height: 6\' 2"  (1.88 m)    Physical Exam  Constitutional: He is oriented to person, place, and time and well-developed, well-nourished, and in no distress.  HENT:  Head: Normocephalic.  Right Ear: External ear normal.  Left Ear: External ear normal.  Nose: Nose normal.  Mouth/Throat: Oropharynx is clear and moist.  Eyes: Pupils are equal, round, and reactive to light. Conjunctivae and EOM are normal. Right eye exhibits no discharge. Left eye exhibits no discharge. No scleral icterus.  Neck: Normal range of motion. Neck supple. No JVD present. No tracheal deviation present. No thyromegaly present.  Cardiovascular: Normal rate, regular rhythm, normal heart  sounds and intact distal pulses.  Exam reveals no gallop and no friction rub.   No murmur heard. Pulmonary/Chest: Breath sounds normal. No respiratory distress. He has no wheezes. He has no rales.  Abdominal: Soft. Bowel sounds are normal. He exhibits no mass. There is no hepatosplenomegaly. There is no tenderness. There is no rebound, no guarding and no CVA tenderness.  Musculoskeletal: Normal range of motion. He exhibits no edema or tenderness.  Lymphadenopathy:    He has no cervical  adenopathy.  Neurological: He is alert and oriented to person, place, and time. He has normal sensation, normal strength, normal reflexes and intact cranial nerves. No cranial nerve deficit.  Skin: Skin is warm. No rash noted.  Psychiatric: Mood and affect normal.  Nursing note and vitals reviewed.     Assessment & Plan  Problem List Items Addressed This Visit      Cardiovascular and Mediastinum   Essential hypertension   Relevant Medications   hydrochlorothiazide (HYDRODIURIL) 12.5 MG tablet   lisinopril (PRINIVIL,ZESTRIL) 5 MG tablet   metoprolol tartrate (LOPRESSOR) 50 MG tablet   amLODipine (NORVASC) 10 MG tablet   Other Relevant Orders   Renal Function Panel   PVD (peripheral vascular disease) (HCC)   Relevant Medications   hydrochlorothiazide (HYDRODIURIL) 12.5 MG tablet   lisinopril (PRINIVIL,ZESTRIL) 5 MG tablet   metoprolol tartrate (LOPRESSOR) 50 MG tablet   amLODipine (NORVASC) 10 MG tablet     Endocrine   Type 2 diabetes mellitus without complication, without long-term current use of insulin (HCC) - Primary   Relevant Medications   lisinopril (PRINIVIL,ZESTRIL) 5 MG tablet   metFORMIN (GLUCOPHAGE) 500 MG tablet   glipiZIDE (GLUCOTROL XL) 10 MG 24 hr tablet   Other Relevant Orders   Renal Function Panel   Lipid Profile   Hemoglobin A1c    Other Visit Diagnoses    Hypertension associated with diabetes (HCC)       Relevant Medications   hydrochlorothiazide (HYDRODIURIL) 12.5 MG tablet   lisinopril (PRINIVIL,ZESTRIL) 5 MG tablet   metoprolol tartrate (LOPRESSOR) 50 MG tablet   amLODipine (NORVASC) 10 MG tablet   metFORMIN (GLUCOPHAGE) 500 MG tablet   glipiZIDE (GLUCOTROL XL) 10 MG 24 hr tablet   Other Relevant Orders   Renal Function Panel   Hyperlipidemia, unspecified hyperlipidemia type       Relevant Medications   hydrochlorothiazide (HYDRODIURIL) 12.5 MG tablet   lisinopril (PRINIVIL,ZESTRIL) 5 MG tablet   metoprolol tartrate (LOPRESSOR) 50 MG tablet    amLODipine (NORVASC) 10 MG tablet   Other Relevant Orders   Lipid Profile   Influenza vaccine needed       Relevant Orders   Flu Vaccine QUAD 6+ mos PF IM (Fluarix Quad PF) (Completed)      Meds ordered this encounter  Medications  . hydrochlorothiazide (HYDRODIURIL) 12.5 MG tablet    Sig: Take 1 tablet (12.5 mg total) by mouth daily.    Dispense:  90 tablet    Refill:  1  . lisinopril (PRINIVIL,ZESTRIL) 5 MG tablet    Sig: Take 1 tablet (5 mg total) by mouth daily.    Dispense:  90 tablet    Refill:  1  . metoprolol tartrate (LOPRESSOR) 50 MG tablet    Sig: Take 1 tablet (50 mg total) by mouth 2 (two) times daily.    Dispense:  180 tablet    Refill:  1  . amLODipine (NORVASC) 10 MG tablet    Sig: Take 1 tablet (10 mg total) by  mouth daily.    Dispense:  90 tablet    Refill:  1  . metFORMIN (GLUCOPHAGE) 500 MG tablet    Sig: Take 1 tablet (500 mg total) by mouth 2 (two) times daily.    Dispense:  180 tablet    Refill:  1  . glipiZIDE (GLUCOTROL XL) 10 MG 24 hr tablet    Sig: Take 1 tablet (10 mg total) by mouth daily with breakfast.    Dispense:  90 tablet    Refill:  1      Dr. Hayden [facility]  12/15/16

## 2016-12-16 ENCOUNTER — Other Ambulatory Visit: Payer: Self-pay

## 2016-12-16 ENCOUNTER — Other Ambulatory Visit: Payer: Self-pay | Admitting: Family Medicine

## 2016-12-16 LAB — RENAL FUNCTION PANEL
Albumin: 4.8 g/dL — ABNORMAL HIGH (ref 3.5–4.7)
BUN/Creatinine Ratio: 12 (ref 10–24)
BUN: 11 mg/dL (ref 8–27)
CO2: 25 mmol/L (ref 20–29)
Calcium: 9.8 mg/dL (ref 8.6–10.2)
Chloride: 97 mmol/L (ref 96–106)
Creatinine, Ser: 0.91 mg/dL (ref 0.76–1.27)
GFR calc Af Amer: 90 mL/min/{1.73_m2} (ref >59)
GFR calc non Af Amer: 78 mL/min/{1.73_m2} (ref >59)
Glucose: 304 mg/dL — ABNORMAL HIGH (ref 65–99)
Phosphorus: 3.3 mg/dL (ref 2.5–4.5)
Potassium: 4.4 mmol/L (ref 3.5–5.2)
Sodium: 139 mmol/L (ref 134–144)

## 2016-12-16 LAB — LIPID PANEL
Chol/HDL Ratio: 3 ratio (ref 0.0–5.0)
Cholesterol, Total: 164 mg/dL (ref 100–199)
HDL: 54 mg/dL (ref >39)
LDL Calculated: 94 mg/dL (ref 0–99)
Triglycerides: 82 mg/dL (ref 0–149)
VLDL Cholesterol Cal: 16 mg/dL (ref 5–40)

## 2016-12-16 LAB — HEMOGLOBIN A1C
Est. average glucose Bld gHb Est-mCnc: 183 mg/dL
Hgb A1c MFr Bld: 8 % — ABNORMAL HIGH (ref 4.8–5.6)

## 2016-12-16 MED ORDER — PIOGLITAZONE HCL 15 MG PO TABS: 15.0000 mg | ORAL_TABLET | Freq: Every day | ORAL | 1 refills | Status: DC

## 2017-03-02 ENCOUNTER — Other Ambulatory Visit: Payer: Self-pay

## 2017-03-02 DIAGNOSIS — E119 Type 2 diabetes mellitus without complications: Secondary | ICD-10-CM

## 2017-03-02 MED ORDER — GLUCOSE BLOOD VI STRP: ORAL_STRIP | 1 refills | Status: DC

## 2017-05-08 ENCOUNTER — Other Ambulatory Visit: Payer: Self-pay

## 2017-05-09 ENCOUNTER — Encounter: Payer: Self-pay | Admitting: Family Medicine

## 2017-05-09 ENCOUNTER — Ambulatory Visit (INDEPENDENT_AMBULATORY_CARE_PROVIDER_SITE_OTHER): Payer: Medicare Other | Admitting: Family Medicine

## 2017-05-09 VITALS — BP 140/82 | HR 80 | Ht 74.0 in | Wt 157.0 lb

## 2017-05-09 DIAGNOSIS — I739 Peripheral vascular disease, unspecified: Secondary | ICD-10-CM

## 2017-05-09 DIAGNOSIS — I10 Essential (primary) hypertension: Secondary | ICD-10-CM

## 2017-05-09 DIAGNOSIS — E1159 Type 2 diabetes mellitus with other circulatory complications: Secondary | ICD-10-CM

## 2017-05-09 DIAGNOSIS — S88111A Complete traumatic amputation at level between knee and ankle, right lower leg, initial encounter: Secondary | ICD-10-CM

## 2017-05-09 DIAGNOSIS — Z89511 Acquired absence of right leg below knee: Secondary | ICD-10-CM | POA: Diagnosis not present

## 2017-05-09 NOTE — Progress Notes (Signed)
Name: Jose Schroeder   MRN: [id number]    DOB: 08-16-1932   Date:05/09/2017       Progress Note  Subjective  Chief Complaint  Chief Complaint  Patient presents with  . Diabetes    Diabetes  He presents for his follow-up diabetic visit. He has type 2 diabetes mellitus. Pertinent negatives for hypoglycemia include no confusion, dizziness, headaches, hunger, mood changes, nervousness/anxiousness, pallor, seizures, sleepiness, speech difficulty, sweats or tremors. Associated symptoms include blurred vision. Pertinent negatives for diabetes include no chest pain, no fatigue, no foot paresthesias, no foot ulcerations, no polydipsia, no polyphagia, no polyuria, no visual change, no weakness and no weight loss. There are no hypoglycemic complications. Symptoms are stable. Diabetic complications include PVD. Pertinent negatives for diabetic complications include no CVA or retinopathy. Risk factors for coronary artery disease include diabetes mellitus, dyslipidemia and hypertension. Current diabetic treatment includes oral agent (dual therapy). His weight is stable. He is following a generally healthy diet. His breakfast blood glucose is taken between 8-9 am. His breakfast blood glucose range is generally 130-140 mg/dl. (409) An ACE inhibitor/angiotensin II receptor blocker is being taken. He does not see a podiatrist.Eye exam is not current.  Hypertension  This is a chronic problem. The current episode started more than 1 year ago. The problem has been gradually improving since onset. The problem is controlled. Associated symptoms include blurred vision. Pertinent negatives include no chest pain, headaches, malaise/fatigue, neck pain, palpitations, shortness of breath or sweats. There are no associated agents to hypertension. Past treatments include ACE inhibitors, calcium channel blockers and beta blockers. The current treatment provides moderate improvement. Hypertensive end-organ damage includes PVD.  There is no history of angina, kidney disease, CAD/MI, CVA, heart failure, left ventricular hypertrophy or retinopathy. There is no history of chronic renal disease, a hypertension causing med or renovascular disease.    No problem-specific Assessment & Plan notes found for this encounter.   Past Medical History:  Diagnosis Date  . Diabetes mellitus without complication (HCC)   . Hypertension   . Peripheral vascular disease [facility])     Past Surgical History:  Procedure Laterality Date  . AMPUTATION TOE Right 01/15/2016   Procedure: AMPUTATION TOE;  Surgeon: Gwyneth Revels, DPM;  Location: [facility] ORS;  Service: Podiatry;  Laterality: Right;  . PERIPHERAL VASCULAR CATHETERIZATION Right 01/07/2016   Procedure: Abdominal Aortogram w/Lower Extremity;  Surgeon: Renford Dills, MD;  Location: [facility] INVASIVE CV LAB;  Service: Cardiovascular;  Laterality: Right;  . TOE SURGERY     amputated 2 toes on L) foot    Family History  Problem Relation Age of Onset  . Diabetes Mother   . Hypertension Mother   . Diabetes Father   . Hypertension Father     Social History   Socioeconomic History  . Marital status: Married    Spouse name: Not on file  . Number of children: Not on file  . Years of education: Not on file  . Highest education level: Not on file  Social Needs  . Financial resource strain: Not on file  . Food insecurity - worry: Not on file  . Food insecurity - inability: Not on file  . Transportation needs - medical: Not on file  . Transportation needs - non-medical: Not on file  Occupational History  . Not on file  Tobacco Use  . Smoking status: Never Smoker  . Smokeless tobacco: Never Used  Substance and Sexual Activity  . Alcohol use: No    Alcohol/week:  0.0 oz  . Drug use: No  . Sexual activity: Not Currently  Other Topics Concern  . Not on file  Social History Narrative  . Not on file    No Known Allergies  Outpatient Medications Prior to Visit  Medication Sig  Dispense Refill  . amLODipine (NORVASC) 10 MG tablet Take 1 tablet (10 mg total) by mouth daily. 90 tablet 1  . aspirin EC 81 MG tablet Take 1 tablet (81 mg total) by mouth daily. 30 tablet 2  . ferrous sulfate 325 (65 FE) MG tablet Take 1 tablet (325 mg total) by mouth daily with breakfast. 30 tablet 5  . glipiZIDE (GLUCOTROL XL) 10 MG 24 hr tablet Take 1 tablet (10 mg total) by mouth daily with breakfast. 90 tablet 1  . glucose blood test strip Use as instructed 100 each 1  . hydrochlorothiazide (HYDRODIURIL) 12.5 MG tablet Take 1 tablet (12.5 mg total) by mouth daily. 90 tablet 1  . lisinopril (PRINIVIL,ZESTRIL) 5 MG tablet Take 1 tablet (5 mg total) by mouth daily. 90 tablet 1  . metFORMIN (GLUCOPHAGE) 500 MG tablet Take 1 tablet (500 mg total) by mouth 2 (two) times daily. 180 tablet 1  . metoprolol tartrate (LOPRESSOR) 50 MG tablet Take 1 tablet (50 mg total) by mouth 2 (two) times daily. 180 tablet 1  . pioglitazone (ACTOS) 15 MG tablet TAKE 1 TABLET(15 MG) BY MOUTH DAILY IN ADDITION TO THE MEDS ALREADY TAKING 90 tablet 1   No facility-administered medications prior to visit.     Review of Systems  Constitutional: Negative for chills, fatigue, fever, malaise/fatigue and weight loss.  HENT: Negative for ear discharge, ear pain and sore throat.   Eyes: Positive for blurred vision. Negative for double vision, pain and discharge.  Respiratory: Negative for cough, sputum production, shortness of breath and wheezing.   Cardiovascular: Negative for chest pain, palpitations and leg swelling.  Gastrointestinal: Negative for abdominal pain, blood in stool, constipation, diarrhea, heartburn, melena and nausea.  Genitourinary: Negative for dysuria, frequency, hematuria and urgency.  Musculoskeletal: Negative for back pain, joint pain, myalgias and neck pain.  Skin: Negative for pallor and rash.  Neurological: Negative for dizziness, tingling, tremors, sensory change, focal weakness, seizures,  speech difficulty, weakness and headaches.  Endo/Heme/Allergies: Negative for environmental allergies, polydipsia and polyphagia. Does not bruise/bleed easily.  Psychiatric/Behavioral: Negative for confusion, depression and suicidal ideas. The patient is not nervous/anxious and does not have insomnia.      Objective  Vitals:   05/09/17 1354  BP: 140/82  Pulse: 80  Weight: 157 lb (71.2 kg)  Height: 6\' 2"  (1.88 m)    Physical Exam  Constitutional: He is oriented to person, place, and time and well-developed, well-nourished, and in no distress.  HENT:  Head: Normocephalic.  Right Ear: External ear normal.  Left Ear: External ear normal.  Nose: Nose normal.  Mouth/Throat: Oropharynx is clear and moist.  Eyes: Conjunctivae and EOM are normal. Pupils are equal, round, and reactive to light. Right eye exhibits no discharge. Left eye exhibits no discharge. No scleral icterus.  Neck: Normal range of motion. Neck supple. No JVD present. No tracheal deviation present. No thyromegaly present.  Cardiovascular: Normal rate, regular rhythm, normal heart sounds and intact distal pulses. Exam reveals no gallop and no friction rub.  No murmur heard. Pulmonary/Chest: Breath sounds normal. No respiratory distress. He has no wheezes. He has no rales.  Abdominal: Soft. Bowel sounds are normal. He exhibits no mass. There is no  hepatosplenomegaly. There is no tenderness. There is no rebound, no guarding and no CVA tenderness.  Musculoskeletal: Normal range of motion. He exhibits no edema or tenderness.  Lymphadenopathy:    He has no cervical adenopathy.  Neurological: He is alert and oriented to person, place, and time. He has normal sensation, normal strength, normal reflexes and intact cranial nerves. No cranial nerve deficit.  Skin: Skin is warm. No rash noted.  Psychiatric: Mood and affect normal.  Nursing note and vitals reviewed.     Assessment & Plan  Problem List Items Addressed This Visit       Cardiovascular and Mediastinum   PVD (peripheral vascular disease) (HCC)   Relevant Orders   Ambulatory referral to Podiatry   Hypertension associated with diabetes (HCC) - Primary   Relevant Orders   Hemoglobin A1c   Renal function panel   Ambulatory referral to Ophthalmology   Ambulatory referral to Podiatry     Other   Amputated below knee, right [facility])   Relevant Orders   Ambulatory referral to Podiatry      No orders of the defined types were placed in this encounter.     Dr. Hayden [facility]  05/09/17

## 2017-05-10 LAB — HEMOGLOBIN A1C
Est. average glucose Bld gHb Est-mCnc: 177 mg/dL
Hgb A1c MFr Bld: 7.8 % — ABNORMAL HIGH (ref 4.8–5.6)

## 2017-05-10 LAB — RENAL FUNCTION PANEL
Albumin: 5.2 g/dL — ABNORMAL HIGH (ref 3.5–4.7)
BUN/Creatinine Ratio: 10 (ref 10–24)
BUN: 10 mg/dL (ref 8–27)
CO2: 26 mmol/L (ref 20–29)
Calcium: 9.9 mg/dL (ref 8.6–10.2)
Chloride: 97 mmol/L (ref 96–106)
Creatinine, Ser: 0.97 mg/dL (ref 0.76–1.27)
GFR calc Af Amer: 84 mL/min/{1.73_m2} (ref >59)
GFR calc non Af Amer: 72 mL/min/{1.73_m2} (ref >59)
Glucose: 266 mg/dL — ABNORMAL HIGH (ref 65–99)
Phosphorus: 3.3 mg/dL (ref 2.5–4.5)
Potassium: 5.3 mmol/L — ABNORMAL HIGH (ref 3.5–5.2)
Sodium: 138 mmol/L (ref 134–144)

## 2017-05-11 ENCOUNTER — Other Ambulatory Visit: Payer: Self-pay | Admitting: Family Medicine

## 2017-05-11 ENCOUNTER — Other Ambulatory Visit: Payer: Self-pay

## 2017-05-11 DIAGNOSIS — E1159 Type 2 diabetes mellitus with other circulatory complications: Secondary | ICD-10-CM

## 2017-05-11 DIAGNOSIS — E119 Type 2 diabetes mellitus without complications: Secondary | ICD-10-CM

## 2017-05-11 MED ORDER — SITAGLIPTIN PHOSPHATE 100 MG PO TABS: 100.0000 mg | ORAL_TABLET | Freq: Every day | ORAL | 1 refills | Status: DC

## 2017-05-17 ENCOUNTER — Other Ambulatory Visit: Payer: Self-pay

## 2017-05-17 DIAGNOSIS — E1159 Type 2 diabetes mellitus with other circulatory complications: Secondary | ICD-10-CM

## 2017-05-17 DIAGNOSIS — I10 Essential (primary) hypertension: Secondary | ICD-10-CM

## 2017-05-17 DIAGNOSIS — E119 Type 2 diabetes mellitus without complications: Secondary | ICD-10-CM

## 2017-05-17 MED ORDER — METOPROLOL TARTRATE 50 MG PO TABS: 50.0000 mg | ORAL_TABLET | Freq: Two times a day (BID) | ORAL | 0 refills | Status: DC

## 2017-05-17 MED ORDER — LISINOPRIL 5 MG PO TABS: 5.0000 mg | ORAL_TABLET | Freq: Every day | ORAL | 0 refills | Status: DC

## 2017-05-17 MED ORDER — HYDROCHLOROTHIAZIDE 12.5 MG PO TABS
12.5000 mg | ORAL_TABLET | Freq: Every day | ORAL | 0 refills | Status: DC
Start: 2017-05-17 — End: 2017-07-05

## 2017-05-17 MED ORDER — METFORMIN HCL 500 MG PO TABS: 500.0000 mg | ORAL_TABLET | Freq: Two times a day (BID) | ORAL | 0 refills | Status: DC

## 2017-05-17 MED ORDER — AMLODIPINE BESYLATE 10 MG PO TABS: 10.0000 mg | ORAL_TABLET | Freq: Every day | ORAL | 0 refills | Status: DC

## 2017-05-26 DIAGNOSIS — E113599 Type 2 diabetes mellitus with proliferative diabetic retinopathy without macular edema, unspecified eye: Secondary | ICD-10-CM | POA: Diagnosis not present

## 2017-05-31 DIAGNOSIS — Z89511 Acquired absence of right leg below knee: Secondary | ICD-10-CM | POA: Diagnosis not present

## 2017-05-31 DIAGNOSIS — E1151 Type 2 diabetes mellitus with diabetic peripheral angiopathy without gangrene: Secondary | ICD-10-CM | POA: Diagnosis not present

## 2017-05-31 DIAGNOSIS — Z89422 Acquired absence of other left toe(s): Secondary | ICD-10-CM | POA: Diagnosis not present

## 2017-05-31 DIAGNOSIS — B351 Tinea unguium: Secondary | ICD-10-CM | POA: Diagnosis not present

## 2017-05-31 DIAGNOSIS — M722 Plantar fascial fibromatosis: Secondary | ICD-10-CM | POA: Diagnosis not present

## 2017-06-01 LAB — HM DIABETES EYE EXAM

## 2017-06-02 ENCOUNTER — Other Ambulatory Visit: Payer: Self-pay

## 2017-06-15 DIAGNOSIS — H26492 Other secondary cataract, left eye: Secondary | ICD-10-CM | POA: Diagnosis not present

## 2017-06-15 DIAGNOSIS — H26499 Other secondary cataract, unspecified eye: Secondary | ICD-10-CM | POA: Diagnosis not present

## 2017-06-29 DIAGNOSIS — E113591 Type 2 diabetes mellitus with proliferative diabetic retinopathy without macular edema, right eye: Secondary | ICD-10-CM | POA: Diagnosis not present

## 2017-06-29 DIAGNOSIS — E113599 Type 2 diabetes mellitus with proliferative diabetic retinopathy without macular edema, unspecified eye: Secondary | ICD-10-CM | POA: Diagnosis not present

## 2017-07-05 ENCOUNTER — Other Ambulatory Visit: Payer: Self-pay | Admitting: Family Medicine

## 2017-07-05 DIAGNOSIS — E119 Type 2 diabetes mellitus without complications: Secondary | ICD-10-CM

## 2017-07-05 DIAGNOSIS — I152 Hypertension secondary to endocrine disorders: Secondary | ICD-10-CM

## 2017-07-05 DIAGNOSIS — E1159 Type 2 diabetes mellitus with other circulatory complications: Secondary | ICD-10-CM

## 2017-07-05 DIAGNOSIS — I10 Essential (primary) hypertension: Secondary | ICD-10-CM

## 2017-07-11 DIAGNOSIS — H4311 Vitreous hemorrhage, right eye: Secondary | ICD-10-CM | POA: Diagnosis not present

## 2017-07-24 ENCOUNTER — Other Ambulatory Visit: Payer: Self-pay | Admitting: Family Medicine

## 2017-07-24 DIAGNOSIS — E119 Type 2 diabetes mellitus without complications: Secondary | ICD-10-CM

## 2017-08-01 DIAGNOSIS — H4311 Vitreous hemorrhage, right eye: Secondary | ICD-10-CM | POA: Diagnosis not present

## 2017-08-01 DIAGNOSIS — E113599 Type 2 diabetes mellitus with proliferative diabetic retinopathy without macular edema, unspecified eye: Secondary | ICD-10-CM | POA: Diagnosis not present

## 2017-08-01 DIAGNOSIS — E113591 Type 2 diabetes mellitus with proliferative diabetic retinopathy without macular edema, right eye: Secondary | ICD-10-CM | POA: Diagnosis not present

## 2017-09-11 DIAGNOSIS — E113591 Type 2 diabetes mellitus with proliferative diabetic retinopathy without macular edema, right eye: Secondary | ICD-10-CM | POA: Diagnosis not present

## 2017-09-11 DIAGNOSIS — H4311 Vitreous hemorrhage, right eye: Secondary | ICD-10-CM | POA: Diagnosis not present

## 2017-09-11 DIAGNOSIS — E113599 Type 2 diabetes mellitus with proliferative diabetic retinopathy without macular edema, unspecified eye: Secondary | ICD-10-CM | POA: Diagnosis not present

## 2017-10-09 ENCOUNTER — Other Ambulatory Visit: Payer: Self-pay | Admitting: Family Medicine

## 2017-10-09 DIAGNOSIS — I10 Essential (primary) hypertension: Secondary | ICD-10-CM

## 2017-10-09 DIAGNOSIS — E1159 Type 2 diabetes mellitus with other circulatory complications: Secondary | ICD-10-CM

## 2017-10-09 DIAGNOSIS — E119 Type 2 diabetes mellitus without complications: Secondary | ICD-10-CM

## 2017-10-09 DIAGNOSIS — I152 Hypertension secondary to endocrine disorders: Secondary | ICD-10-CM

## 2017-11-30 ENCOUNTER — Ambulatory Visit (INDEPENDENT_AMBULATORY_CARE_PROVIDER_SITE_OTHER): Payer: Medicare Other | Admitting: Family Medicine

## 2017-11-30 ENCOUNTER — Encounter: Payer: Self-pay | Admitting: Family Medicine

## 2017-11-30 VITALS — BP 130/78 | HR 100 | Ht 74.0 in | Wt 157.0 lb

## 2017-11-30 DIAGNOSIS — Z89511 Acquired absence of right leg below knee: Secondary | ICD-10-CM

## 2017-11-30 DIAGNOSIS — D508 Other iron deficiency anemias: Secondary | ICD-10-CM | POA: Diagnosis not present

## 2017-11-30 DIAGNOSIS — I739 Peripheral vascular disease, unspecified: Secondary | ICD-10-CM | POA: Diagnosis not present

## 2017-11-30 DIAGNOSIS — E1159 Type 2 diabetes mellitus with other circulatory complications: Secondary | ICD-10-CM | POA: Diagnosis not present

## 2017-11-30 DIAGNOSIS — I10 Essential (primary) hypertension: Secondary | ICD-10-CM

## 2017-11-30 DIAGNOSIS — S88111A Complete traumatic amputation at level between knee and ankle, right lower leg, initial encounter: Secondary | ICD-10-CM

## 2017-11-30 MED ORDER — LISINOPRIL 5 MG PO TABS: 5.0000 mg | ORAL_TABLET | Freq: Every day | ORAL | 0 refills | Status: DC

## 2017-11-30 MED ORDER — AMLODIPINE BESYLATE 10 MG PO TABS: 10.0000 mg | ORAL_TABLET | Freq: Every day | ORAL | 0 refills | Status: DC

## 2017-11-30 MED ORDER — SITAGLIPTIN PHOSPHATE 100 MG PO TABS: 100.0000 mg | ORAL_TABLET | Freq: Every day | ORAL | 0 refills | Status: DC

## 2017-11-30 MED ORDER — METFORMIN HCL 500 MG PO TABS: 500.0000 mg | ORAL_TABLET | Freq: Two times a day (BID) | ORAL | 0 refills | Status: DC

## 2017-11-30 MED ORDER — HYDROCHLOROTHIAZIDE 12.5 MG PO TABS: 12.5000 mg | ORAL_TABLET | Freq: Every day | ORAL | 0 refills | Status: DC

## 2017-11-30 MED ORDER — GLIPIZIDE ER 10 MG PO TB24: 10.0000 mg | ORAL_TABLET | Freq: Every day | ORAL | 0 refills | Status: DC

## 2017-11-30 MED ORDER — METOPROLOL TARTRATE 50 MG PO TABS: 50.0000 mg | ORAL_TABLET | Freq: Two times a day (BID) | ORAL | 0 refills | Status: DC

## 2017-11-30 MED ORDER — FERROUS SULFATE 325 (65 FE) MG PO TABS: 325.0000 mg | ORAL_TABLET | Freq: Every day | ORAL | 5 refills | Status: DC

## 2017-11-30 NOTE — Progress Notes (Signed)
Name: Jose Schroeder   MRN: [id number]    DOB: [birth year]   Date:11/30/2017       Progress Note  Subjective  Chief Complaint  Chief Complaint  Patient presents with  . Diabetes  . Hypertension  . Anemia    Diabetes  He presents for his follow-up diabetic visit. He has type 2 diabetes mellitus. His disease course has been stable. There are no hypoglycemic associated symptoms. Pertinent negatives for hypoglycemia include no confusion, dizziness, headaches, nervousness/anxiousness, pallor or sweats. Pertinent negatives for diabetes include no blurred vision, no chest pain, no fatigue, no foot paresthesias, no foot ulcerations, no polydipsia, no polyphagia, no polyuria, no visual change, no weakness and no weight loss. There are no hypoglycemic complications. Symptoms are stable. There are no diabetic complications. Pertinent negatives for diabetic complications include no CVA, PVD or retinopathy. Risk factors for coronary artery disease include dyslipidemia, diabetes mellitus and hypertension. Current diabetic treatment includes oral agent (triple therapy). He is compliant with treatment all of the time. He is following a generally healthy diet. When asked about meal planning, he reported none. He participates in exercise intermittently. His breakfast blood glucose is taken between 8-9 am. His breakfast blood glucose range is generally 140-180 mg/dl. An ACE inhibitor/angiotensin II receptor blocker is being taken. He does not see a podiatrist.Eye exam is not current.  Hypertension  This is a chronic problem. The current episode started more than 1 year ago. The problem is unchanged. The problem is controlled. Pertinent negatives include no anxiety, blurred vision, chest pain, headaches, malaise/fatigue, neck pain, orthopnea, palpitations, peripheral edema, PND, shortness of breath or sweats. There are no associated agents to hypertension. Past treatments include ACE inhibitors, diuretics and beta  blockers. The current treatment provides moderate improvement. There is no history of angina, kidney disease, CAD/MI, CVA, heart failure, left ventricular hypertrophy, PVD or retinopathy. There is no history of chronic renal disease, a hypertension causing med or renovascular disease.  Anemia  Presents for follow-up visit. There has been no abdominal pain, bruising/bleeding easily, confusion, fever, leg swelling, light-headedness, malaise/fatigue, pallor, palpitations, paresthesias or weight loss. Signs of blood loss that are not present include hematemesis, hematochezia, melena and menorrhagia. There is no history of chronic renal disease or heart failure. There are no compliance problems.     PVD (peripheral vascular disease) (HCC) Continue all meds as prescribed/ stable on B/P meds   Past Medical History:  Diagnosis Date  . Diabetes mellitus without complication (HCC)   . Hypertension   . Peripheral vascular disease [facility])     Past Surgical History:  Procedure Laterality Date  . AMPUTATION TOE Right 01/15/2016   Procedure: AMPUTATION TOE;  Surgeon: Gwyneth Revels, DPM;  Location: [facility] ORS;  Service: Podiatry;  Laterality: Right;  . PERIPHERAL VASCULAR CATHETERIZATION Right 01/07/2016   Procedure: Abdominal Aortogram w/Lower Extremity;  Surgeon: Renford Dills, MD;  Location: [facility] INVASIVE CV LAB;  Service: Cardiovascular;  Laterality: Right;  . TOE SURGERY     amputated 2 toes on L) foot    Family History  Problem Relation Age of Onset  . Diabetes Mother   . Hypertension Mother   . Diabetes Father   . Hypertension Father     Social History   Socioeconomic History  . Marital status: Married    Spouse name: Not on file  . Number of children: Not on file  . Years of education: Not on file  . Highest education level: Not on file  Occupational History  .  Not on file  Social Needs  . Financial resource strain: Not on file  . Food insecurity:    Worry: Not on file     Inability: Not on file  . Transportation needs:    Medical: Not on file    Non-medical: Not on file  Tobacco Use  . Smoking status: Never Smoker  . Smokeless tobacco: Never Used  Substance and Sexual Activity  . Alcohol use: No    Alcohol/week: 0.0 standard drinks  . Drug use: No  . Sexual activity: Not Currently  Lifestyle  . Physical activity:    Days per week: Not on file    Minutes per session: Not on file  . Stress: Not on file  Relationships  . Social connections:    Talks on phone: Not on file    Gets together: Not on file    Attends religious service: Not on file    Active member of club or organization: Not on file    Attends meetings of clubs or organizations: Not on file    Relationship status: Not on file  . Intimate partner violence:    Fear of current or ex partner: Not on file    Emotionally abused: Not on file    Physically abused: Not on file    Forced sexual activity: Not on file  Other Topics Concern  . Not on file  Social History Narrative  . Not on file    No Known Allergies  Outpatient Medications Prior to Visit  Medication Sig Dispense Refill  . aspirin EC 81 MG tablet Take 1 tablet (81 mg total) by mouth daily. 30 tablet 2  . glucose blood (ONE TOUCH ULTRA TEST) test strip USE AS DIRECTED BY PHYSICIAN 100 each 1  . amLODipine (NORVASC) 10 MG tablet TAKE 1 TABLET BY MOUTH  DAILY 90 tablet 0  . ferrous sulfate 325 (65 FE) MG tablet Take 1 tablet (325 mg total) by mouth daily with breakfast. 30 tablet 5  . glipiZIDE (GLUCOTROL XL) 10 MG 24 hr tablet TAKE 1 TABLET BY MOUTH  DAILY WITH BREAKFAST 90 tablet 1  . hydrochlorothiazide (HYDRODIURIL) 12.5 MG tablet TAKE 1 TABLET BY MOUTH  DAILY 90 tablet 0  . JANUVIA 100 MG tablet TAKE 1 TABLET(100 MG) BY MOUTH DAILY. STOP ACTOS AND. START THIS IN THE PLACE OF ACTOS 30 tablet 1  . lisinopril (PRINIVIL,ZESTRIL) 5 MG tablet TAKE 1 TABLET BY MOUTH  DAILY 90 tablet 0  . metFORMIN (GLUCOPHAGE) 500 MG tablet TAKE  1 TABLET BY MOUTH TWO  TIMES DAILY 180 tablet 0  . metoprolol tartrate (LOPRESSOR) 50 MG tablet TAKE 1 TABLET BY MOUTH TWO  TIMES DAILY 180 tablet 0   No facility-administered medications prior to visit.     Review of Systems  Constitutional: Negative for chills, fatigue, fever, malaise/fatigue and weight loss.  HENT: Negative for ear discharge, ear pain and sore throat.   Eyes: Negative for blurred vision.  Respiratory: Negative for cough, sputum production, shortness of breath and wheezing.   Cardiovascular: Negative for chest pain, palpitations, orthopnea, leg swelling and PND.  Gastrointestinal: Negative for abdominal pain, blood in stool, constipation, diarrhea, heartburn, hematemesis, hematochezia, melena and nausea.  Genitourinary: Negative for dysuria, frequency, hematuria, menorrhagia and urgency.  Musculoskeletal: Negative for back pain, joint pain, myalgias and neck pain.  Skin: Negative for pallor and rash.  Neurological: Negative for dizziness, tingling, sensory change, focal weakness, weakness, light-headedness, headaches and paresthesias.  Endo/Heme/Allergies: Negative for environmental allergies,  polydipsia and polyphagia. Does not bruise/bleed easily.  Psychiatric/Behavioral: Negative for confusion, depression and suicidal ideas. The patient is not nervous/anxious and does not have insomnia.      Objective  Vitals:   11/30/17 1333  BP: 130/78  Pulse: 100  Weight: 157 lb (71.2 kg)  Height: 6\' 2"  (1.88 m)    Physical Exam  Constitutional: He is oriented to person, place, and time.  HENT:  Head: Normocephalic.  Right Ear: External ear normal.  Left Ear: External ear normal.  Nose: Nose normal.  Mouth/Throat: Oropharynx is clear and moist.  Eyes: Pupils are equal, round, and reactive to light. Conjunctivae and EOM are normal. Right eye exhibits no discharge. Left eye exhibits no discharge. No scleral icterus.  Neck: Normal range of motion. Neck supple. No JVD  present. No tracheal deviation present. No thyromegaly present.  Cardiovascular: Normal rate, regular rhythm, normal heart sounds and intact distal pulses. Exam reveals no gallop and no friction rub.  No murmur heard. Pulmonary/Chest: Breath sounds normal. No respiratory distress. He has no wheezes. He has no rales.  Abdominal: Soft. Bowel sounds are normal. He exhibits no mass. There is no hepatosplenomegaly. There is no tenderness. There is no rebound, no guarding and no CVA tenderness.  Musculoskeletal: Normal range of motion. He exhibits no edema or tenderness.  Lymphadenopathy:    He has no cervical adenopathy.  Neurological: He is alert and oriented to person, place, and time. He has normal strength and normal reflexes. No cranial nerve deficit.  Skin: Skin is warm. No rash noted.  Nursing note and vitals reviewed.     Assessment & Plan  Problem List Items Addressed This Visit      Cardiovascular and Mediastinum   PVD (peripheral vascular disease) (HCC)    Continue all meds as prescribed/ stable on B/P meds      Relevant Medications   amLODipine (NORVASC) 10 MG tablet   hydrochlorothiazide (HYDRODIURIL) 12.5 MG tablet   lisinopril (PRINIVIL,ZESTRIL) 5 MG tablet   metoprolol tartrate (LOPRESSOR) 50 MG tablet     Other   Amputated below knee, right (HCC)    Other Visit Diagnoses    Type 2 diabetes mellitus with other circulatory complication, without long-term current use of insulin (HCC)    -  Primary   Chronic Controlled Continue metformen 500 mg bid,glipizide XL 10 q day, and Januvia 100mg  daily. Will check A1C and renal panel.   Relevant Medications   glipiZIDE (GLUCOTROL XL) 10 MG 24 hr tablet   sitaGLIPtin (JANUVIA) 100 MG tablet   lisinopril (PRINIVIL,ZESTRIL) 5 MG tablet   metFORMIN (GLUCOPHAGE) 500 MG tablet   Other Relevant Orders   Hemoglobin A1c   Renal Function Panel   Lipid panel   Essential hypertension       stable on meds- continue Amlodipine10mg ,  HCTZ 12.5mg , lisinopril 5mg  and metoprolol 50mg / draw renal panel   Relevant Medications   amLODipine (NORVASC) 10 MG tablet   hydrochlorothiazide (HYDRODIURIL) 12.5 MG tablet   lisinopril (PRINIVIL,ZESTRIL) 5 MG tablet   metoprolol tartrate (LOPRESSOR) 50 MG tablet   Other Relevant Orders   Renal Function Panel   Anemia, iron deficiency       continue ferrous sulfate- stable on med   Relevant Medications   ferrous sulfate 325 (65 FE) MG tablet      Meds ordered this encounter  Medications  . amLODipine (NORVASC) 10 MG tablet    Sig: Take 1 tablet (10 mg total) by mouth daily.  Dispense:  30 tablet    Refill:  0  . ferrous sulfate 325 (65 FE) MG tablet    Sig: Take 1 tablet (325 mg total) by mouth daily with breakfast.    Dispense:  30 tablet    Refill:  5  . glipiZIDE (GLUCOTROL XL) 10 MG 24 hr tablet    Sig: Take 1 tablet (10 mg total) by mouth daily with breakfast.    Dispense:  30 tablet    Refill:  0  . hydrochlorothiazide (HYDRODIURIL) 12.5 MG tablet    Sig: Take 1 tablet (12.5 mg total) by mouth daily.    Dispense:  30 tablet    Refill:  0  . sitaGLIPtin (JANUVIA) 100 MG tablet    Sig: Take 1 tablet (100 mg total) by mouth daily.    Dispense:  30 tablet    Refill:  0    We may not be keeping the pt on this med- this is a trial of 6 weeks to see how he responds. We went over this on phone with wife.  Marland Kitchen. lisinopril (PRINIVIL,ZESTRIL) 5 MG tablet    Sig: Take 1 tablet (5 mg total) by mouth daily.    Dispense:  30 tablet    Refill:  0  . metFORMIN (GLUCOPHAGE) 500 MG tablet    Sig: Take 1 tablet (500 mg total) by mouth 2 (two) times daily.    Dispense:  60 tablet    Refill:  0  . metoprolol tartrate (LOPRESSOR) 50 MG tablet    Sig: Take 1 tablet (50 mg total) by mouth 2 (two) times daily.    Dispense:  60 tablet    Refill:  0      Dr. Elizabeth [facility]anna [facility]  11/30/17

## 2017-11-30 NOTE — Assessment & Plan Note (Signed)
Continue all meds as prescribed/ stable on B/P meds

## 2017-12-04 ENCOUNTER — Other Ambulatory Visit: Payer: Medicare Other

## 2017-12-04 DIAGNOSIS — I10 Essential (primary) hypertension: Secondary | ICD-10-CM | POA: Diagnosis not present

## 2017-12-04 DIAGNOSIS — E1159 Type 2 diabetes mellitus with other circulatory complications: Secondary | ICD-10-CM

## 2017-12-04 DIAGNOSIS — E785 Hyperlipidemia, unspecified: Secondary | ICD-10-CM | POA: Diagnosis not present

## 2017-12-05 ENCOUNTER — Other Ambulatory Visit: Payer: Self-pay

## 2017-12-05 DIAGNOSIS — E1159 Type 2 diabetes mellitus with other circulatory complications: Secondary | ICD-10-CM

## 2017-12-05 LAB — RENAL FUNCTION PANEL
Albumin: 5.1 g/dL — ABNORMAL HIGH (ref 3.5–4.7)
BUN/Creatinine Ratio: 13 (ref 10–24)
BUN: 14 mg/dL (ref 8–27)
CO2: 23 mmol/L (ref 20–29)
Calcium: 10 mg/dL (ref 8.6–10.2)
Chloride: 104 mmol/L (ref 96–106)
Creatinine, Ser: 1.12 mg/dL (ref 0.76–1.27)
GFR calc Af Amer: 70 mL/min/{1.73_m2} (ref >59)
GFR calc non Af Amer: 60 mL/min/{1.73_m2} (ref >59)
Glucose: 153 mg/dL — ABNORMAL HIGH (ref 65–99)
Phosphorus: 3.3 mg/dL (ref 2.5–4.5)
Potassium: 5 mmol/L (ref 3.5–5.2)
Sodium: 143 mmol/L (ref 134–144)

## 2017-12-05 LAB — LIPID PANEL WITH LDL/HDL RATIO
Cholesterol, Total: 191 mg/dL (ref 100–199)
HDL: 56 mg/dL (ref >39)
LDL Calculated: 119 mg/dL — ABNORMAL HIGH (ref 0–99)
LDl/HDL Ratio: 2.1 ratio (ref 0.0–3.6)
Triglycerides: 80 mg/dL (ref 0–149)
VLDL Cholesterol Cal: 16 mg/dL (ref 5–40)

## 2017-12-05 LAB — HEMOGLOBIN A1C
Est. average glucose Bld gHb Est-mCnc: 174 mg/dL
Hgb A1c MFr Bld: 7.7 % — ABNORMAL HIGH (ref 4.8–5.6)

## 2017-12-05 MED ORDER — GLIPIZIDE ER 10 MG PO TB24: 10.0000 mg | ORAL_TABLET | Freq: Two times a day (BID) | ORAL | 0 refills | Status: DC

## 2017-12-18 DIAGNOSIS — B351 Tinea unguium: Secondary | ICD-10-CM | POA: Diagnosis not present

## 2017-12-18 DIAGNOSIS — Z89511 Acquired absence of right leg below knee: Secondary | ICD-10-CM | POA: Diagnosis not present

## 2017-12-18 DIAGNOSIS — Z89422 Acquired absence of other left toe(s): Secondary | ICD-10-CM | POA: Diagnosis not present

## 2017-12-18 DIAGNOSIS — E1151 Type 2 diabetes mellitus with diabetic peripheral angiopathy without gangrene: Secondary | ICD-10-CM | POA: Diagnosis not present

## 2018-01-15 ENCOUNTER — Other Ambulatory Visit: Payer: Self-pay | Admitting: Family Medicine

## 2018-01-15 DIAGNOSIS — E1159 Type 2 diabetes mellitus with other circulatory complications: Secondary | ICD-10-CM

## 2018-01-15 DIAGNOSIS — I10 Essential (primary) hypertension: Secondary | ICD-10-CM

## 2018-04-11 ENCOUNTER — Other Ambulatory Visit: Payer: Self-pay | Admitting: Family Medicine

## 2018-04-11 DIAGNOSIS — I10 Essential (primary) hypertension: Secondary | ICD-10-CM

## 2018-04-11 DIAGNOSIS — E1159 Type 2 diabetes mellitus with other circulatory complications: Secondary | ICD-10-CM

## 2018-05-14 ENCOUNTER — Ambulatory Visit (INDEPENDENT_AMBULATORY_CARE_PROVIDER_SITE_OTHER): Payer: Medicare Other | Admitting: Family Medicine

## 2018-05-14 ENCOUNTER — Encounter: Payer: Self-pay | Admitting: Family Medicine

## 2018-05-14 VITALS — BP 140/70 | HR 80 | Ht 74.0 in | Wt 156.0 lb

## 2018-05-14 DIAGNOSIS — J301 Allergic rhinitis due to pollen: Secondary | ICD-10-CM | POA: Diagnosis not present

## 2018-05-14 DIAGNOSIS — E1159 Type 2 diabetes mellitus with other circulatory complications: Secondary | ICD-10-CM

## 2018-05-14 DIAGNOSIS — I10 Essential (primary) hypertension: Secondary | ICD-10-CM | POA: Diagnosis not present

## 2018-05-14 DIAGNOSIS — D508 Other iron deficiency anemias: Secondary | ICD-10-CM

## 2018-05-14 DIAGNOSIS — W19XXXA Unspecified fall, initial encounter: Secondary | ICD-10-CM

## 2018-05-14 DIAGNOSIS — M25511 Pain in right shoulder: Secondary | ICD-10-CM | POA: Diagnosis not present

## 2018-05-14 MED ORDER — FERROUS SULFATE 325 (65 FE) MG PO TABS: 325.0000 mg | ORAL_TABLET | Freq: Every day | ORAL | 1 refills | Status: DC

## 2018-05-14 MED ORDER — LISINOPRIL 5 MG PO TABS: 5.0000 mg | ORAL_TABLET | Freq: Every day | ORAL | 1 refills | Status: DC

## 2018-05-14 MED ORDER — HYDROCHLOROTHIAZIDE 12.5 MG PO TABS: 12.5000 mg | ORAL_TABLET | Freq: Every day | ORAL | 1 refills | Status: DC

## 2018-05-14 MED ORDER — SITAGLIPTIN PHOSPHATE 100 MG PO TABS: 100.0000 mg | ORAL_TABLET | Freq: Every day | ORAL | 1 refills | Status: DC

## 2018-05-14 MED ORDER — AMLODIPINE BESYLATE 10 MG PO TABS: 10.0000 mg | ORAL_TABLET | Freq: Every day | ORAL | 1 refills | Status: DC

## 2018-05-14 MED ORDER — GLIPIZIDE ER 10 MG PO TB24: 10.0000 mg | ORAL_TABLET | Freq: Every day | ORAL | 1 refills | Status: DC

## 2018-05-14 MED ORDER — METFORMIN HCL 500 MG PO TABS: 500.0000 mg | ORAL_TABLET | Freq: Two times a day (BID) | ORAL | 1 refills | Status: DC

## 2018-05-14 MED ORDER — METOPROLOL TARTRATE 50 MG PO TABS: 50.0000 mg | ORAL_TABLET | Freq: Two times a day (BID) | ORAL | 1 refills | Status: DC

## 2018-05-14 MED ORDER — LORATADINE 10 MG PO TABS: 10.0000 mg | ORAL_TABLET | Freq: Every day | ORAL | 11 refills | Status: DC

## 2018-05-14 NOTE — Progress Notes (Signed)
Date:  05/14/2018   Name:  Jose Schroeder   DOB:  [birth year]   MRN:  [id number]   Chief Complaint: Hypertension; Diabetes; Anemia; Allergic Rhinitis ; and Shoulder Pain (fell last summer- R) shoulder has hurt since then)  Hypertension  This is a chronic problem. The current episode started more than 1 year ago. The problem is unchanged. The problem is controlled. Pertinent negatives include no anxiety, blurred vision, chest pain, headaches, malaise/fatigue, neck pain, orthopnea, palpitations, peripheral edema, PND, shortness of breath or sweats. There are no associated agents to hypertension. Risk factors for coronary artery disease include diabetes mellitus and dyslipidemia. Past treatments include beta blockers, ACE inhibitors and diuretics. The current treatment provides moderate improvement. There are no compliance problems.  There is no history of angina, kidney disease, CAD/MI, CVA, heart failure, left ventricular hypertrophy, PVD or retinopathy. There is no history of chronic renal disease, a hypertension causing med or renovascular disease.  Diabetes  He presents for his follow-up diabetic visit. He has type 2 diabetes mellitus. His disease course has been stable. There are no hypoglycemic associated symptoms. Pertinent negatives for hypoglycemia include no confusion, dizziness, headaches, hunger, nervousness/anxiousness, pallor, seizures or sweats. Pertinent negatives for diabetes include no blurred vision, no chest pain, no fatigue, no foot paresthesias, no foot ulcerations, no polydipsia, no polyphagia, no polyuria, no visual change, no weakness and no weight loss. There are no hypoglycemic complications. Pertinent negatives for diabetic complications include no autonomic neuropathy, CVA, heart disease, peripheral neuropathy, PVD or retinopathy. Risk factors for coronary artery disease include diabetes mellitus, dyslipidemia, male sex and hypertension. Current diabetic treatment includes  oral agent (triple therapy). He is compliant with treatment all of the time. His weight is stable. He is following a generally healthy diet. Meal planning includes avoidance of concentrated sweets and carbohydrate counting. He participates in exercise daily. There is no change in his home blood glucose trend. His breakfast blood glucose range is generally 140-180 mg/dl. An ACE inhibitor/angiotensin II receptor blocker is being taken. He does not see a podiatrist.Eye exam is not current.  Anemia  Presents for follow-up visit. There has been no abdominal pain, anorexia, bruising/bleeding easily, confusion, fever, leg swelling, light-headedness, malaise/fatigue, pallor, palpitations, paresthesias, pica or weight loss. Signs of blood loss that are not present include hematemesis, hematochezia and melena. There is no history of chronic renal disease or heart failure. There are no compliance problems.   Shoulder Pain   The pain is present in the right shoulder. This is a new problem. The current episode started more than 1 month ago. The problem occurs daily. The problem has been gradually worsening. The quality of the pain is described as aching. The pain is moderate. Associated symptoms include a limited range of motion and stiffness. Pertinent negatives include no fever, inability to bear weight, itching, joint locking or joint swelling. The treatment provided mild relief.    Review of Systems  Constitutional: Negative for chills, fatigue, fever, malaise/fatigue and weight loss.  HENT: Negative for drooling, ear discharge, ear pain and sore throat.   Eyes: Negative for blurred vision.  Respiratory: Negative for cough, shortness of breath and wheezing.   Cardiovascular: Negative for chest pain, palpitations, orthopnea, leg swelling and PND.  Gastrointestinal: Negative for abdominal pain, anorexia, blood in stool, constipation, diarrhea, hematemesis, hematochezia, melena and nausea.  Endocrine: Negative for  polydipsia, polyphagia and polyuria.  Genitourinary: Negative for dysuria, frequency, hematuria and urgency.  Musculoskeletal: Positive for stiffness. Negative for back  pain, myalgias and neck pain.  Skin: Negative for itching, pallor and rash.  Allergic/Immunologic: Negative for environmental allergies.  Neurological: Negative for dizziness, seizures, weakness, light-headedness, headaches and paresthesias.  Hematological: Does not bruise/bleed easily.  Psychiatric/Behavioral: Negative for confusion and suicidal ideas. The patient is not nervous/anxious.     Patient Active Problem List   Diagnosis Date Noted  . Amputated below knee, right (HCC) 05/09/2017  . PVD (peripheral vascular disease) (HCC) 12/15/2016  . Toe infection 01/04/2016  . Type 2 diabetes mellitus without complication, without long-term current use of insulin (HCC) 09/24/2015  . Hypertension associated with diabetes (HCC) 09/24/2015    No Known Allergies  Past Surgical History:  Procedure Laterality Date  . AMPUTATION TOE Right 01/15/2016   Procedure: AMPUTATION TOE;  Surgeon: Gwyneth Revels, DPM;  Location: [facility];  Service: Podiatry;  Laterality: Right;  . PERIPHERAL VASCULAR CATHETERIZATION Right 01/07/2016   Procedure: Abdominal Aortogram w/Lower Extremity;  Surgeon: Renford Dills, MD;  Location: [facility];  Service: Cardiovascular;  Laterality: Right;  . TOE SURGERY     amputated 2 toes on L) foot    Social History   Tobacco Use  . Smoking status: Never Smoker  . Smokeless tobacco: Never Used  Substance Use Topics  . Alcohol use: No    Alcohol/week: 0.0 standard drinks  . Drug use: No     Medication list has been reviewed and updated.  Current Meds  Medication Sig  . amLODipine (NORVASC) 10 MG tablet Take 1 tablet (10 mg total) by mouth daily.  Marland Kitchen aspirin EC 81 MG tablet Take 1 tablet (81 mg total) by mouth daily.  . ferrous sulfate 325 (65 FE) MG tablet Take 1 tablet (325 mg total)  by mouth daily with breakfast.  . glipiZIDE (GLUCOTROL XL) 10 MG 24 hr tablet Take 1 tablet (10 mg total) by mouth daily with breakfast.  . glucose blood (ONE TOUCH ULTRA TEST) test strip USE AS DIRECTED BY PHYSICIAN  . hydrochlorothiazide (HYDRODIURIL) 12.5 MG tablet Take 1 tablet (12.5 mg total) by mouth daily.  Marland Kitchen lisinopril (PRINIVIL,ZESTRIL) 5 MG tablet Take 1 tablet (5 mg total) by mouth daily.  . metFORMIN (GLUCOPHAGE) 500 MG tablet Take 1 tablet (500 mg total) by mouth 2 (two) times daily.  . metoprolol tartrate (LOPRESSOR) 50 MG tablet Take 1 tablet (50 mg total) by mouth 2 (two) times daily.  . sitaGLIPtin (JANUVIA) 100 MG tablet Take 1 tablet (100 mg total) by mouth daily.  . [DISCONTINUED] amLODipine (NORVASC) 10 MG tablet TAKE 1 TABLET BY MOUTH  DAILY  . [DISCONTINUED] ferrous sulfate 325 (65 FE) MG tablet Take 1 tablet (325 mg total) by mouth daily with breakfast.  . [DISCONTINUED] glipiZIDE (GLUCOTROL XL) 10 MG 24 hr tablet TAKE 1 TABLET BY MOUTH  DAILY WITH BREAKFAST  . [DISCONTINUED] hydrochlorothiazide (HYDRODIURIL) 12.5 MG tablet TAKE 1 TABLET BY MOUTH  DAILY  . [DISCONTINUED] lisinopril (PRINIVIL,ZESTRIL) 5 MG tablet TAKE 1 TABLET BY MOUTH  DAILY  . [DISCONTINUED] metFORMIN (GLUCOPHAGE) 500 MG tablet TAKE 1 TABLET BY MOUTH TWO  TIMES DAILY  . [DISCONTINUED] metoprolol tartrate (LOPRESSOR) 50 MG tablet TAKE 1 TABLET BY MOUTH TWO  TIMES DAILY  . [DISCONTINUED] sitaGLIPtin (JANUVIA) 100 MG tablet Take 1 tablet (100 mg total) by mouth daily.    PHQ 2/9 Scores 05/14/2018 12/15/2016 09/24/2015 02/20/2015  PHQ - 2 Score 0 0 0 0  PHQ- 9 Score 0 0 - -    Physical Exam Vitals signs  and nursing note reviewed.  HENT:     Head: Normocephalic.     Right Ear: External ear normal.     Left Ear: External ear normal.     Nose: Nose normal.  Eyes:     General: No scleral icterus.       Right eye: No discharge.        Left eye: No discharge.     Conjunctiva/sclera: Conjunctivae normal.       Pupils: Pupils are equal, round, and reactive to light.  Neck:     Musculoskeletal: Normal range of motion and neck supple.     Thyroid: No thyromegaly.     Vascular: No JVD.     Trachea: No tracheal deviation.  Cardiovascular:     Rate and Rhythm: Normal rate and regular rhythm.     Heart sounds: Normal heart sounds. No murmur. No friction rub. No gallop.   Pulmonary:     Effort: No respiratory distress.     Breath sounds: Normal breath sounds. No wheezing or rales.  Abdominal:     General: Bowel sounds are normal.     Palpations: Abdomen is soft. There is no mass.     Tenderness: There is no abdominal tenderness. There is no guarding or rebound.  Musculoskeletal:        General: No tenderness.  Lymphadenopathy:     Cervical: No cervical adenopathy.  Skin:    General: Skin is warm.     Findings: No rash.  Neurological:     Mental Status: He is alert and oriented to person, place, and time.     Cranial Nerves: No cranial nerve deficit.     Deep Tendon Reflexes: Reflexes are normal and symmetric.     BP 140/70   Pulse 80   Ht 6\' 2"  (1.88 m)   Wt 156 lb (70.8 kg)   BMI 20.03 kg/m   Assessment and Plan: 1. Essential hypertension Chronic. Controlled on meds- refill amlodipine, HCTZ, lisinopril and Metoprolol/ draw renal panel, lipid panel and hepatic function - amLODipine (NORVASC) 10 MG tablet; Take 1 tablet (10 mg total) by mouth daily.  Dispense: 90 tablet; Refill: 1 - hydrochlorothiazide (HYDRODIURIL) 12.5 MG tablet; Take 1 tablet (12.5 mg total) by mouth daily.  Dispense: 90 tablet; Refill: 1 - lisinopril (PRINIVIL,ZESTRIL) 5 MG tablet; Take 1 tablet (5 mg total) by mouth daily.  Dispense: 90 tablet; Refill: 1 - metoprolol tartrate (LOPRESSOR) 50 MG tablet; Take 1 tablet (50 mg total) by mouth 2 (two) times daily.  Dispense: 180 tablet; Refill: 1 - Renal Function Panel - Lipid Panel With LDL/HDL Ratio - Hepatic function panel  2. Other iron deficiency  anemia Chronic. Controlled on ferrous sulfate- refill med/ draw hepatic and hemoglobin - ferrous sulfate 325 (65 FE) MG tablet; Take 1 tablet (325 mg total) by mouth daily with breakfast.  Dispense: 90 tablet; Refill: 1 - Hepatic function panel - Hemoglobin  3. Type 2 diabetes mellitus with other circulatory complication, without long-term current use of insulin (HCC) Chronic. Controlled on meds- refill glipizide, metformin, and januvia/ draw A1C and obtain microalbumin - glipiZIDE (GLUCOTROL XL) 10 MG 24 hr tablet; Take 1 tablet (10 mg total) by mouth daily with breakfast.  Dispense: 90 tablet; Refill: 1 - metFORMIN (GLUCOPHAGE) 500 MG tablet; Take 1 tablet (500 mg total) by mouth 2 (two) times daily.  Dispense: 180 tablet; Refill: 1 - sitaGLIPtin (JANUVIA) 100 MG tablet; Take 1 tablet (100 mg total) by mouth daily.  Dispense: 90 tablet; Refill: 1 - HgB A1c - Hepatic function panel - Microalbumin, urine  4. Non-seasonal allergic rhinitis due to pollen Acute. Been having runny nose and runny eyes. Start loratadine daily - loratadine (CLARITIN) 10 MG tablet; Take 1 tablet (10 mg total) by mouth daily.  Dispense: 30 tablet; Refill: 11  5. Acute pain of right shoulder Larey SeatFell in the summer of last year while doing yard work. Fell on right shoulder. Complaining of pain in right shoulder today, still bothering him. Has limited range of motion. Obtain xray of shoulder for further eval. - DG Shoulder Right; Future  6. Fall, initial encounter Larey SeatFell back in the summer of last year while doing yard work. Will obtain an xray of shoulder to determine if he may have had a fracture. - DG Shoulder Right; Future

## 2018-05-15 LAB — RENAL FUNCTION PANEL
Albumin: 5.1 g/dL — ABNORMAL HIGH (ref 3.6–4.6)
BUN/Creatinine Ratio: 12 (ref 10–24)
BUN: 11 mg/dL (ref 8–27)
CO2: 24 mmol/L (ref 20–29)
Calcium: 10.1 mg/dL (ref 8.6–10.2)
Chloride: 98 mmol/L (ref 96–106)
Creatinine, Ser: 0.9 mg/dL (ref 0.76–1.27)
GFR calc Af Amer: 91 mL/min/{1.73_m2} (ref >59)
GFR calc non Af Amer: 79 mL/min/{1.73_m2} (ref >59)
Glucose: 178 mg/dL — ABNORMAL HIGH (ref 65–99)
Phosphorus: 3.4 mg/dL (ref 2.8–4.1)
Potassium: 4.4 mmol/L (ref 3.5–5.2)
Sodium: 142 mmol/L (ref 134–144)

## 2018-05-15 LAB — LIPID PANEL WITH LDL/HDL RATIO
Cholesterol, Total: 197 mg/dL (ref 100–199)
HDL: 59 mg/dL (ref >39)
LDL Calculated: 120 mg/dL — ABNORMAL HIGH (ref 0–99)
LDl/HDL Ratio: 2 ratio (ref 0.0–3.6)
Triglycerides: 92 mg/dL (ref 0–149)
VLDL Cholesterol Cal: 18 mg/dL (ref 5–40)

## 2018-05-15 LAB — HEPATIC FUNCTION PANEL
ALT: 10 IU/L (ref 0–44)
AST: 16 IU/L (ref 0–40)
Alkaline Phosphatase: 58 IU/L (ref 39–117)
Bilirubin Total: 0.4 mg/dL (ref 0.0–1.2)
Bilirubin, Direct: 0.11 mg/dL (ref 0.00–0.40)
Total Protein: 8 g/dL (ref 6.0–8.5)

## 2018-05-15 LAB — HEMOGLOBIN A1C
Est. average glucose Bld gHb Est-mCnc: 151 mg/dL
Hgb A1c MFr Bld: 6.9 % — ABNORMAL HIGH (ref 4.8–5.6)

## 2018-05-15 LAB — HEMOGLOBIN: Hemoglobin: 11.7 g/dL — ABNORMAL LOW (ref 13.0–17.7)

## 2018-05-15 LAB — MICROALBUMIN, URINE: Microalbumin, Urine: 5.1 ug/mL

## 2018-06-25 DIAGNOSIS — B351 Tinea unguium: Secondary | ICD-10-CM | POA: Diagnosis not present

## 2018-06-25 DIAGNOSIS — E1151 Type 2 diabetes mellitus with diabetic peripheral angiopathy without gangrene: Secondary | ICD-10-CM | POA: Diagnosis not present

## 2018-06-25 DIAGNOSIS — Z89511 Acquired absence of right leg below knee: Secondary | ICD-10-CM | POA: Diagnosis not present

## 2018-06-25 DIAGNOSIS — Z89422 Acquired absence of other left toe(s): Secondary | ICD-10-CM | POA: Diagnosis not present

## 2018-07-25 ENCOUNTER — Ambulatory Visit: Payer: Self-pay

## 2018-09-12 ENCOUNTER — Ambulatory Visit: Payer: Self-pay | Admitting: Family Medicine

## 2018-09-13 ENCOUNTER — Ambulatory Visit (INDEPENDENT_AMBULATORY_CARE_PROVIDER_SITE_OTHER): Payer: Medicare Other | Admitting: Family Medicine

## 2018-09-13 ENCOUNTER — Other Ambulatory Visit: Payer: Self-pay

## 2018-09-13 ENCOUNTER — Encounter: Payer: Self-pay | Admitting: Family Medicine

## 2018-09-13 DIAGNOSIS — I10 Essential (primary) hypertension: Secondary | ICD-10-CM

## 2018-09-13 DIAGNOSIS — Z89511 Acquired absence of right leg below knee: Secondary | ICD-10-CM

## 2018-09-13 DIAGNOSIS — I739 Peripheral vascular disease, unspecified: Secondary | ICD-10-CM

## 2018-09-13 DIAGNOSIS — E1159 Type 2 diabetes mellitus with other circulatory complications: Secondary | ICD-10-CM

## 2018-09-13 DIAGNOSIS — J301 Allergic rhinitis due to pollen: Secondary | ICD-10-CM | POA: Diagnosis not present

## 2018-09-13 DIAGNOSIS — D508 Other iron deficiency anemias: Secondary | ICD-10-CM | POA: Diagnosis not present

## 2018-09-13 MED ORDER — LORATADINE 10 MG PO TABS: 10.0000 mg | ORAL_TABLET | Freq: Every day | ORAL | 1 refills | Status: DC

## 2018-09-13 MED ORDER — METOPROLOL TARTRATE 50 MG PO TABS: 50.0000 mg | ORAL_TABLET | Freq: Two times a day (BID) | ORAL | 1 refills | Status: DC

## 2018-09-13 MED ORDER — GLIPIZIDE ER 10 MG PO TB24: 10.0000 mg | ORAL_TABLET | Freq: Every day | ORAL | 1 refills | Status: DC

## 2018-09-13 MED ORDER — HYDROCHLOROTHIAZIDE 12.5 MG PO TABS: 12.5000 mg | ORAL_TABLET | Freq: Every day | ORAL | 1 refills | Status: DC

## 2018-09-13 MED ORDER — METFORMIN HCL 500 MG PO TABS: 500.0000 mg | ORAL_TABLET | Freq: Two times a day (BID) | ORAL | 1 refills | Status: DC

## 2018-09-13 MED ORDER — FERROUS SULFATE 325 (65 FE) MG PO TABS: 325.0000 mg | ORAL_TABLET | Freq: Every day | ORAL | 1 refills | Status: DC

## 2018-09-13 MED ORDER — SITAGLIPTIN PHOSPHATE 100 MG PO TABS: 100.0000 mg | ORAL_TABLET | Freq: Every day | ORAL | 1 refills | Status: DC

## 2018-09-13 MED ORDER — AMLODIPINE BESYLATE 10 MG PO TABS: 10.0000 mg | ORAL_TABLET | Freq: Every day | ORAL | 1 refills | Status: DC

## 2018-09-13 NOTE — Progress Notes (Signed)
Date:  09/13/2018   Name:  Jose Schroeder   DOB:  1932-07-10   MRN:  [id number]   Chief Complaint: Diabetes; Hypertension; Anemia; and Allergic Rhinitis   Diabetes  He presents for his follow-up diabetic visit. He has type 2 diabetes mellitus. His disease course has been stable. There are no hypoglycemic associated symptoms. Pertinent negatives for hypoglycemia include no dizziness, headaches, nervousness/anxiousness or sweats. Pertinent negatives for diabetes include no blurred vision, no chest pain, no fatigue, no foot paresthesias, no foot ulcerations, no polydipsia, no polyphagia, no polyuria, no visual change, no weakness and no weight loss. There are no hypoglycemic complications. Symptoms are stable. There are no diabetic complications. Pertinent negatives for diabetic complications include no CVA, PVD or retinopathy. Risk factors for coronary artery disease include diabetes mellitus, hypertension and male sex. Current diabetic treatment includes oral agent (triple therapy) (met,glip,januvia). He is compliant with treatment most of the time. He is following a generally healthy diet. Meal planning includes avoidance of concentrated sweets and carbohydrate counting. An ACE inhibitor/angiotensin II receptor blocker is being taken.  Hypertension  This is a chronic problem. The current episode started more than 1 year ago. The problem is controlled. Pertinent negatives include no anxiety, blurred vision, chest pain, headaches, malaise/fatigue, neck pain, orthopnea, palpitations, peripheral edema, PND, shortness of breath or sweats. There are no associated agents to hypertension. Past treatments include beta blockers, calcium channel blockers, diuretics and ACE inhibitors. The current treatment provides moderate improvement. There is no history of angina, kidney disease, CAD/MI, CVA, heart failure, left ventricular hypertrophy, PVD or retinopathy. There is no history of chronic renal disease, a  hypertension causing med or renovascular disease.  Anemia  Presents for follow-up visit. Symptoms include paresthesias. There has been no abdominal pain, bruising/bleeding easily, fever, malaise/fatigue, palpitations or weight loss. Signs of blood loss that are not present include hematemesis and hematochezia. There is no history of chronic renal disease or heart failure. Side effects of medications include headaches.  Hyperlipidemia  This is a chronic problem. The current episode started more than 1 year ago. Recent lipid tests were reviewed and are high (LDL). He has no history of chronic renal disease. Pertinent negatives include no chest pain, myalgias or shortness of breath.    Review of Systems  Constitutional: Negative for chills, fatigue, fever, malaise/fatigue and weight loss.  HENT: Negative for drooling, ear discharge, ear pain and sore throat.   Eyes: Negative for blurred vision.  Respiratory: Negative for cough, shortness of breath and wheezing.   Cardiovascular: Negative for chest pain, palpitations, orthopnea, leg swelling and PND.  Gastrointestinal: Negative for abdominal pain, blood in stool, constipation, diarrhea, hematemesis, hematochezia and nausea.  Endocrine: Negative for polydipsia, polyphagia and polyuria.  Genitourinary: Negative for dysuria, frequency, hematuria and urgency.  Musculoskeletal: Negative for back pain, myalgias and neck pain.  Skin: Negative for rash.  Allergic/Immunologic: Negative for environmental allergies.  Neurological: Positive for paresthesias. Negative for dizziness, weakness and headaches.  Hematological: Does not bruise/bleed easily.  Psychiatric/Behavioral: Negative for suicidal ideas. The patient is not nervous/anxious.     Patient Active Problem List   Diagnosis Date Noted  . Amputated below knee, right ([place]) 05/09/2017  . PVD (peripheral vascular disease) ([place]) 12/15/2016  . Toe infection 01/04/2016  . Type 2 diabetes mellitus  without complication, without long-term current use of insulin ([place]) 09/24/2015  . Hypertension associated with diabetes ([place]) 09/24/2015    No Known Allergies  Past Surgical History:  Procedure Laterality  Date  . AMPUTATION TOE Right 01/15/2016   Procedure: AMPUTATION TOE;  Surgeon: Samara Deist, DPM;  Location: [facility];  Service: Podiatry;  Laterality: Right;  . PERIPHERAL VASCULAR CATHETERIZATION Right 01/07/2016   Procedure: Abdominal Aortogram w/Lower Extremity;  Surgeon: Katha Cabal, MD;  Location: [facility];  Service: Cardiovascular;  Laterality: Right;  . TOE SURGERY     amputated 2 toes on L) foot    Social History   Tobacco Use  . Smoking status: Never Smoker  . Smokeless tobacco: Never Used  Substance Use Topics  . Alcohol use: No    Alcohol/week: 0.0 standard drinks  . Drug use: No     Medication list has been reviewed and updated.  Current Meds  Medication Sig  . amLODipine (NORVASC) 10 MG tablet Take 1 tablet (10 mg total) by mouth daily.  Marland Kitchen aspirin EC 81 MG tablet Take 1 tablet (81 mg total) by mouth daily.  . ferrous sulfate 325 (65 FE) MG tablet Take 1 tablet (325 mg total) by mouth daily with breakfast.  . glipiZIDE (GLUCOTROL XL) 10 MG 24 hr tablet Take 1 tablet (10 mg total) by mouth daily with breakfast.  . glucose blood (ONE TOUCH ULTRA TEST) test strip USE AS DIRECTED BY PHYSICIAN  . hydrochlorothiazide (HYDRODIURIL) 12.5 MG tablet Take 1 tablet (12.5 mg total) by mouth daily.  Marland Kitchen lisinopril (PRINIVIL,ZESTRIL) 5 MG tablet Take 1 tablet (5 mg total) by mouth daily.  Marland Kitchen loratadine (CLARITIN) 10 MG tablet Take 1 tablet (10 mg total) by mouth daily.  . metFORMIN (GLUCOPHAGE) 500 MG tablet Take 1 tablet (500 mg total) by mouth 2 (two) times daily.  . metoprolol tartrate (LOPRESSOR) 50 MG tablet Take 1 tablet (50 mg total) by mouth 2 (two) times daily.  . sitaGLIPtin (JANUVIA) 100 MG tablet Take 1 tablet (100 mg total) by mouth daily.     PHQ 2/9 Scores 09/13/2018 05/14/2018 12/15/2016 09/24/2015  PHQ - 2 Score 0 0 0 0  PHQ- 9 Score 0 0 0 -    BP Readings from Last 3 Encounters:  09/13/18 138/80  05/14/18 140/70  11/30/17 130/78    Physical Exam Vitals signs and nursing note reviewed.  HENT:     Head: Normocephalic.     Right Ear: Tympanic membrane, ear canal and external ear normal.     Left Ear: Tympanic membrane, ear canal and external ear normal.     Nose: Nose normal.  Eyes:     General: No scleral icterus.       Right eye: No discharge.        Left eye: No discharge.     Conjunctiva/sclera: Conjunctivae normal.     Pupils: Pupils are equal, round, and reactive to light.  Neck:     Musculoskeletal: Normal range of motion and neck supple.     Thyroid: No thyromegaly.     Vascular: No JVD.     Trachea: No tracheal deviation.  Cardiovascular:     Rate and Rhythm: Normal rate and regular rhythm.     Heart sounds: Normal heart sounds. No murmur. No friction rub. No gallop.   Pulmonary:     Effort: No respiratory distress.     Breath sounds: Normal breath sounds. No decreased breath sounds, wheezing, rhonchi or rales.  Abdominal:     General: Bowel sounds are normal.     Palpations: Abdomen is soft. There is no mass.     Tenderness: There is no abdominal  tenderness. There is no guarding or rebound.  Musculoskeletal: Normal range of motion.        General: No tenderness.  Lymphadenopathy:     Cervical: No cervical adenopathy.  Skin:    General: Skin is warm.     Findings: No rash.  Neurological:     Mental Status: He is alert and oriented to person, place, and time.     Cranial Nerves: No cranial nerve deficit.     Deep Tendon Reflexes: Reflexes are normal and symmetric.     Wt Readings from Last 3 Encounters:  09/13/18 161 lb (73 kg)  05/14/18 156 lb (70.8 kg)  11/30/17 157 lb (71.2 kg)    BP 138/80   Pulse 88   Ht '6\' 2"'$  (1.88 m)   Wt 161 lb (73 kg)   BMI 20.67 kg/m   Assessment and Plan:  1. Essential hypertension Chronic.  Controlled.  Continue amlodipine 10 mg once a day hydrochlorothiazide 12.5 mg once a day, and metoprolol 50 mg twice a day.  We will check renal function panel today. - amLODipine (NORVASC) 10 MG tablet; Take 1 tablet (10 mg total) by mouth daily.  Dispense: 90 tablet; Refill: 1 - hydrochlorothiazide (HYDRODIURIL) 12.5 MG tablet; Take 1 tablet (12.5 mg total) by mouth daily.  Dispense: 90 tablet; Refill: 1 - metoprolol tartrate (LOPRESSOR) 50 MG tablet; Take 1 tablet (50 mg total) by mouth 2 (two) times daily.  Dispense: 180 tablet; Refill: 1 - Renal Function Panel  2. Other iron deficiency anemia Chronic.  Controlled.  Continue ferrous sulfate 325 once a day and will check hemoglobin for status. - ferrous sulfate 325 (65 FE) MG tablet; Take 1 tablet (325 mg total) by mouth daily with breakfast.  Dispense: 90 tablet; Refill: 1 - Hemoglobin  3. Type 2 diabetes mellitus with other circulatory complication, without long-term current use of insulin (HCC) Chronic.  Controlled.  Continue glipizide 10 mg once a day metformin 500 mg twice a day and Januvia 100 mg daily.  Will check A1c and renal function panel today. - glipiZIDE (GLUCOTROL XL) 10 MG 24 hr tablet; Take 1 tablet (10 mg total) by mouth daily with breakfast.  Dispense: 90 tablet; Refill: 1 - metFORMIN (GLUCOPHAGE) 500 MG tablet; Take 1 tablet (500 mg total) by mouth 2 (two) times daily.  Dispense: 180 tablet; Refill: 1 - sitaGLIPtin (JANUVIA) 100 MG tablet; Take 1 tablet (100 mg total) by mouth daily.  Dispense: 90 tablet; Refill: 1 - Renal Function Panel - HgB A1c  4. Non-seasonal allergic rhinitis due to pollen Patient with history of allergies will continue loratadine once a day 10 mg - loratadine (CLARITIN) 10 MG tablet; Take 1 tablet (10 mg total) by mouth daily.  Dispense: 91 tablet; Refill: 1  5. Acquired absence of right leg below knee (HCC) And is tolerating below the knee amputation of the  right leg and doing well with prosthesis.  6. PVD (peripheral vascular disease) (HCC) Chronic.  Controlled.  Patient will continue low-dose aspirin and control of blood [place], hypertension, and diabetes.

## 2018-09-14 LAB — RENAL FUNCTION PANEL
Albumin: 5.1 g/dL — ABNORMAL HIGH (ref 3.6–4.6)
BUN/Creatinine Ratio: 14 (ref 10–24)
BUN: 14 mg/dL (ref 8–27)
CO2: 24 mmol/L (ref 20–29)
Calcium: 9.9 mg/dL (ref 8.6–10.2)
Chloride: 96 mmol/L (ref 96–106)
Creatinine, Ser: 1.03 mg/dL (ref 0.76–1.27)
GFR calc Af Amer: 77 mL/min/{1.73_m2} (ref >59)
GFR calc non Af Amer: 66 mL/min/{1.73_m2} (ref >59)
Glucose: 231 mg/dL — ABNORMAL HIGH (ref 65–99)
Phosphorus: 3.3 mg/dL (ref 2.8–4.1)
Potassium: 4.4 mmol/L (ref 3.5–5.2)
Sodium: 136 mmol/L (ref 134–144)

## 2018-09-14 LAB — HEMOGLOBIN A1C
Est. average glucose Bld gHb Est-mCnc: 148 mg/dL
Hgb A1c MFr Bld: 6.8 % — ABNORMAL HIGH (ref 4.8–5.6)

## 2018-09-14 LAB — HEMOGLOBIN: Hemoglobin: 12.1 g/dL — ABNORMAL LOW (ref 13.0–17.7)

## 2018-09-19 ENCOUNTER — Other Ambulatory Visit: Payer: Self-pay

## 2018-09-19 DIAGNOSIS — I10 Essential (primary) hypertension: Secondary | ICD-10-CM

## 2018-09-19 MED ORDER — LISINOPRIL 5 MG PO TABS: 5.0000 mg | ORAL_TABLET | Freq: Every day | ORAL | 1 refills | Status: DC

## 2018-09-24 ENCOUNTER — Ambulatory Visit (INDEPENDENT_AMBULATORY_CARE_PROVIDER_SITE_OTHER): Payer: Medicare Other

## 2018-09-24 ENCOUNTER — Other Ambulatory Visit: Payer: Self-pay

## 2018-09-24 VITALS — BP 142/78 | HR 84 | Temp 97.8°F | Resp 16 | Ht 74.0 in | Wt 161.2 lb

## 2018-09-24 DIAGNOSIS — Z0000 Encounter for general adult medical examination without abnormal findings: Secondary | ICD-10-CM | POA: Diagnosis not present

## 2018-09-24 NOTE — Patient Instructions (Signed)
Jose Schroeder , Thank you for taking time to come for your Medicare Wellness Visit. I appreciate your ongoing commitment to your health goals. Please review the following plan we discussed and let me know if I can assist you in the future.   Screening recommendations/referrals: Colonoscopy: no longer required Recommended yearly ophthalmology/optometry visit for glaucoma screening and checkup Recommended yearly dental visit for hygiene and checkup  Vaccinations: Influenza vaccine: postponed Pneumococcal vaccine: done 09/24/15 Tdap vaccine: done 02/20/15 Shingles vaccine: Shingrix discussed. Please contact your pharmacy for coverage information.   Advanced directives: Advance directive discussed with you today. I have provided a copy for you to complete at home and have notarized. Once this is complete please bring a copy in to our office so we can scan it into your chart.  Conditions/risks identified: Recommend maintaining blood sugar with healthy diet and exercise.   Next appointment: Please follow up in one year for your Medicare Annual Wellness visit.    Preventive Care 14 Years and Older, Male Preventive care refers to lifestyle choices and visits with your health care provider that can promote health and wellness. What does preventive care include?  A yearly physical exam. This is also called an annual well check.  Dental exams once or twice a year.  Routine eye exams. Ask your health care provider how often you should have your eyes checked.  Personal lifestyle choices, including:  Daily care of your teeth and gums.  Regular physical activity.  Eating a healthy diet.  Avoiding tobacco and drug use.  Limiting alcohol use.  Practicing safe sex.  Taking low doses of aspirin every day.  Taking vitamin and mineral supplements as recommended by your health care provider. What happens during an annual well check? The services and screenings done by your health care  provider during your annual well check will depend on your age, overall health, lifestyle risk factors, and family history of disease. Counseling  Your health care provider may ask you questions about your:  Alcohol use.  Tobacco use.  Drug use.  Emotional well-being.  Home and relationship well-being.  Sexual activity.  Eating habits.  History of falls.  Memory and ability to understand (cognition).  Work and work Astronomer. Screening  You may have the following tests or measurements:  Height, weight, and BMI.  Blood pressure.  Lipid and cholesterol levels. These may be checked every 5 years, or more frequently if you are over 37 years old.  Skin check.  Lung cancer screening. You may have this screening every year starting at age 78 if you have a 30-pack-year history of smoking and currently smoke or have quit within the past 15 years.  Fecal occult blood test (FOBT) of the stool. You may have this test every year starting at age 70.  Flexible sigmoidoscopy or colonoscopy. You may have a sigmoidoscopy every 5 years or a colonoscopy every 10 years starting at age 53.  Prostate cancer screening. Recommendations will vary depending on your family history and other risks.  Hepatitis C blood test.  Hepatitis B blood test.  Sexually transmitted disease (STD) testing.  Diabetes screening. This is done by checking your blood sugar (glucose) after you have not eaten for a while (fasting). You may have this done every 1-3 years.  Abdominal aortic aneurysm (AAA) screening. You may need this if you are a current or former smoker.  Osteoporosis. You may be screened starting at age 70 if you are at high risk. Talk with your health  care provider about your test results, treatment options, and if necessary, the need for more tests. Vaccines  Your health care provider may recommend certain vaccines, such as:  Influenza vaccine. This is recommended every year.  Tetanus,  diphtheria, and acellular pertussis (Tdap, Td) vaccine. You may need a Td booster every 10 years.  Zoster vaccine. You may need this after age 42.  Pneumococcal 13-valent conjugate (PCV13) vaccine. One dose is recommended after age 68.  Pneumococcal polysaccharide (PPSV23) vaccine. One dose is recommended after age 44. Talk to your health care provider about which screenings and vaccines you need and how often you need them. This information is not intended to replace advice given to you by your health care provider. Make sure you discuss any questions you have with your health care provider. Document Released: 05/08/2015 Document Revised: 12/30/2015 Document Reviewed: 02/10/2015 Elsevier Interactive Patient Education  2017 [place] Prevention in the Home Falls can cause injuries. They can happen to people of all ages. There are many things you can do to make your home safe and to help prevent falls. What can I do on the outside of my home?  Regularly fix the edges of walkways and driveways and fix any cracks.  Remove anything that might make you trip as you walk through a door, such as a raised step or threshold.  Trim any bushes or trees on the path to your home.  Use bright outdoor lighting.  Clear any walking paths of anything that might make someone trip, such as rocks or tools.  Regularly check to see if handrails are loose or broken. Make sure that both sides of any steps have handrails.  Any raised decks and porches should have guardrails on the edges.  Have any leaves, snow, or ice cleared regularly.  Use sand or salt on walking paths during winter.  Clean up any spills in your garage right away. This includes oil or grease spills. What can I do in the bathroom?  Use night lights.  Install grab bars by the toilet and in the tub and shower. Do not use towel bars as grab bars.  Use non-skid mats or decals in the tub or shower.  If you need to sit down in  the shower, use a plastic, non-slip stool.  Keep the floor dry. Clean up any water that spills on the floor as soon as it happens.  Remove soap buildup in the tub or shower regularly.  Attach bath mats securely with double-sided non-slip rug tape.  Do not have throw rugs and other things on the floor that can make you trip. What can I do in the bedroom?  Use night lights.  Make sure that you have a light by your bed that is easy to reach.  Do not use any sheets or blankets that are too big for your bed. They should not hang down onto the floor.  Have a firm chair that has side arms. You can use this for support while you get dressed.  Do not have throw rugs and other things on the floor that can make you trip. What can I do in the kitchen?  Clean up any spills right away.  Avoid walking on wet floors.  Keep items that you use a lot in easy-to-reach places.  If you need to reach something above you, use a strong step stool that has a grab bar.  Keep electrical cords out of the way.  Do not use floor  polish or wax that makes floors slippery. If you must use wax, use non-skid floor wax.  Do not have throw rugs and other things on the floor that can make you trip. What can I do with my stairs?  Do not leave any items on the stairs.  Make sure that there are handrails on both sides of the stairs and use them. Fix handrails that are broken or loose. Make sure that handrails are as long as the stairways.  Check any carpeting to make sure that it is firmly attached to the stairs. Fix any carpet that is loose or worn.  Avoid having throw rugs at the top or bottom of the stairs. If you do have throw rugs, attach them to the floor with carpet tape.  Make sure that you have a light switch at the top of the stairs and the bottom of the stairs. If you do not have them, ask someone to add them for you. What else can I do to help prevent falls?  Wear shoes that:  Do not have high  heels.  Have rubber bottoms.  Are comfortable and fit you well.  Are closed at the toe. Do not wear sandals.  If you use a stepladder:  Make sure that it is fully opened. Do not climb a closed stepladder.  Make sure that both sides of the stepladder are locked into place.  Ask someone to hold it for you, if possible.  Clearly mark and make sure that you can see:  Any grab bars or handrails.  First and last steps.  Where the edge of each step is.  Use tools that help you move around (mobility aids) if they are needed. These include:  Canes.  Walkers.  Scooters.  Crutches.  Turn on the lights when you go into a dark area. Replace any light bulbs as soon as they burn out.  Set up your furniture so you have a clear path. Avoid moving your furniture around.  If any of your floors are uneven, fix them.  If there are any pets around you, be aware of where they are.  Review your medicines with your doctor. Some medicines can make you feel dizzy. This can increase your chance of falling. Ask your doctor what other things that you can do to help prevent falls. This information is not intended to replace advice given to you by your health care provider. Make sure you discuss any questions you have with your health care provider. Document Released: 02/05/2009 Document Revised: 09/17/2015 Document Reviewed: 05/16/2014 Elsevier Interactive Patient Education  2017 [employer].

## 2018-09-24 NOTE — Progress Notes (Signed)
Subjective:   Jose Schroeder is a 85 y.o. male who presents for Medicare Annual/Subsequent preventive examination.  Review of Systems:   Cardiac Risk Factors include: advanced age (>58men, >8 women);hypertension;dyslipidemia;diabetes mellitus;male gender     Objective:    Vitals: BP (!) 142/78 (BP Location: Left Arm, Patient Position: Sitting, Cuff Size: Normal)    Pulse 84    Temp 97.8 F (36.6 C) (Oral)    Resp 16    Ht 6\' 2"  (1.88 m)    Wt 161 lb 3.2 oz (73.1 kg)    SpO2 98%    BMI 20.70 kg/m   Body mass index is 20.7 kg/m.  Advanced Directives 09/24/2018 07/27/2016 01/15/2016 01/14/2016 01/04/2016 02/20/2015  Does Patient Have a Medical Advance Directive? No No No No No No  Would patient like information on creating a medical advance directive? Yes (MAU/Ambulatory/Procedural Areas - Information given) No - Patient declined No - patient declined information No - patient declined information No - patient declined information No - patient declined information    Tobacco Social History   Tobacco Use  Smoking Status Never Smoker  Smokeless Tobacco Never Used     Counseling given: Not Answered   Clinical Intake:  Pre-visit preparation completed: Yes  Pain : No/denies pain     BMI - recorded: 20.7 Nutritional Status: BMI of 19-24  Normal Nutritional Risks: None Diabetes: Yes CBG done?: No Did pt. bring in CBG monitor from home?: No   Nutrition Risk Assessment:  Has the patient had any N/V/D within the last 2 months?  No  Does the patient have any non-healing wounds?  No  Has the patient had any unintentional weight loss or weight gain?  No   Diabetes:  Is the patient diabetic?  Yes  If diabetic, was a CBG obtained today?  No  Did the patient bring in their glucometer from home?  No  How often do you monitor your CBG's? Daily fasting .   Financial Strains and Diabetes Management:  Are you having any financial strains with the device, your supplies or your  medication? No .  Does the patient want to be seen by Chronic Care Management for management of their diabetes?  No  Would the patient like to be referred to a Nutritionist or for Diabetic Management?  No   Diabetic Exams:  Diabetic Eye Exam: Completed 06/01/17. POSITIVE retinopathy. Overdue for diabetic eye exam. Pt has been advised about the importance in completing this exam.   Diabetic Foot Exam: Completed 12/18/17.   How often do you need to have someone help you when you read instructions, pamphlets, or other written materials from your doctor or pharmacy?: 1 - Never  Interpreter Needed?: No  Information entered by :: Reather Littler LPN  Past Medical History:  Diagnosis Date   Diabetes mellitus without complication (HCC)    Hypertension    Peripheral vascular disease (HCC)    Past Surgical History:  Procedure Laterality Date   AMPUTATION TOE Right 01/15/2016   Procedure: AMPUTATION TOE;  Surgeon: Gwyneth Revels, DPM;  Location: [facility] ORS;  Service: Podiatry;  Laterality: Right;   PERIPHERAL VASCULAR CATHETERIZATION Right 01/07/2016   Procedure: Abdominal Aortogram w/Lower Extremity;  Surgeon: Renford Dills, MD;  Location: [facility] INVASIVE CV LAB;  Service: Cardiovascular;  Laterality: Right;   TOE SURGERY     amputated 2 toes on L) foot   Family History  Problem Relation Age of Onset   Diabetes Mother    Hypertension  Mother    Diabetes Father    Hypertension Father    Social History   Socioeconomic History   Marital status: Married    Spouse name: Not on file   Number of children: 4   Years of education: Not on file   Highest education level: 8th grade  Occupational History   Occupation: retired  Ecologist strain: Somewhat hard   Food insecurity:    Worry: Sometimes true    Inability: Never true   Transportation needs:    Medical: No    Non-medical: No  Tobacco Use   Smoking status: Never Smoker   Smokeless tobacco:  Never Used  Substance and Sexual Activity   Alcohol use: No    Alcohol/week: 0.0 standard drinks   Drug use: No   Sexual activity: Not Currently  Lifestyle   Physical activity:    Days per week: 0 days    Minutes per session: 0 min   Stress: Not at all  Relationships   Social connections:    Talks on phone: More than three times a week    Gets together: More than three times a week    Attends religious service: Never    Active member of club or organization: No    Attends meetings of clubs or organizations: Never    Relationship status: Married  Other Topics Concern   Not on file  Social History Narrative   Not on file    Outpatient Encounter Medications as of 09/24/2018  Medication Sig   amLODipine (NORVASC) 10 MG tablet Take 1 tablet (10 mg total) by mouth daily.   aspirin EC 81 MG tablet Take 1 tablet (81 mg total) by mouth daily.   ferrous sulfate 325 (65 FE) MG tablet Take 1 tablet (325 mg total) by mouth daily with breakfast.   glipiZIDE (GLUCOTROL XL) 10 MG 24 hr tablet Take 1 tablet (10 mg total) by mouth daily with breakfast.   glucose blood (ONE TOUCH ULTRA TEST) test strip USE AS DIRECTED BY PHYSICIAN   hydrochlorothiazide (HYDRODIURIL) 12.5 MG tablet Take 1 tablet (12.5 mg total) by mouth daily.   lisinopril (ZESTRIL) 5 MG tablet Take 1 tablet (5 mg total) by mouth daily.   loratadine (CLARITIN) 10 MG tablet Take 1 tablet (10 mg total) by mouth daily.   metFORMIN (GLUCOPHAGE) 500 MG tablet Take 1 tablet (500 mg total) by mouth 2 (two) times daily.   metoprolol tartrate (LOPRESSOR) 50 MG tablet Take 1 tablet (50 mg total) by mouth 2 (two) times daily.   sitaGLIPtin (JANUVIA) 100 MG tablet Take 1 tablet (100 mg total) by mouth daily.   No facility-administered encounter medications on file as of 09/24/2018.     Activities of Daily Living In your present state of health, do you have any difficulty performing the following activities: 09/24/2018    Hearing? N  Comment declines hearing aids  Vision? Y  Difficulty concentrating or making decisions? N  Walking or climbing stairs? N  Dressing or bathing? N  Doing errands, shopping? N  Preparing Food and eating ? N  Using the Toilet? N  In the past six months, have you accidently leaked urine? N  Do you have problems with loss of bowel control? N  Managing your Medications? N  Managing your Finances? N  Housekeeping or managing your Housekeeping? N  Some recent data might be hidden    Patient Care Team: Duanne Limerick, MD as PCP - General (  Family Medicine)   Assessment:   This is a routine wellness examination for Damaria.  Exercise Activities and Dietary recommendations Current Exercise Habits: The patient does not participate in regular exercise at present, Exercise limited by: orthopedic condition(s)  Goals     Patient Stated     Maintain diabetes and reduce amount of late night eating        Fall Risk Fall Risk  09/24/2018 09/13/2018 05/14/2018 12/15/2016 09/24/2015  Falls in the past year? 0 0 1 No No  Number falls in past yr: 0 - 0 - -  Injury with Fall? 0 - 0 - -  Risk for fall due to : Impaired balance/gait;Impaired mobility - Impaired balance/gait - -  Follow up Falls prevention discussed Falls evaluation completed Falls evaluation completed - -   FALL RISK PREVENTION PERTAINING TO THE HOME:  Any stairs in or around the home? No  If so, do they handrails? No   Home free of loose throw rugs in walkways, pet beds, electrical cords, etc? Yes  Adequate lighting in your home to reduce risk of falls? Yes   ASSISTIVE DEVICES UTILIZED TO PREVENT FALLS:  Life alert? No  Use of a cane, walker or w/c? Yes  Grab bars in the bathroom? Yes  Shower chair or bench in shower? Yes  Elevated toilet seat or a handicapped toilet? No   DME ORDERS:  DME order needed?  No   TIMED UP AND GO:  Was the test performed? Yes .  Length of time to ambulate 10 feet: 8 sec.    GAIT:  Appearance of gait: Gait slow, steady and with the use of an assistive device.   Education: Fall risk prevention has been discussed.  Intervention(s) required? No   Depression Screen PHQ 2/9 Scores 09/24/2018 09/13/2018 05/14/2018 12/15/2016  PHQ - 2 Score 0 0 0 0  PHQ- 9 Score - 0 0 0    Cognitive Function     6CIT Screen 09/24/2018  What Year? 0 points  What month? 3 points  What time? 0 points  Count back from 20 0 points  Months in reverse 4 points  Repeat phrase 6 points  Total Score 13    Immunization History  Administered Date(s) Administered   Influenza,inj,Quad PF,6+ Mos 12/15/2016   Pneumococcal Conjugate-13 09/24/2015   Pneumococcal Polysaccharide-23 11/22/2013   Tdap 02/20/2015    Qualifies for Shingles Vaccine? Yes  . Due for Shingrix. Education has been provided regarding the importance of this vaccine. Pt has been advised to call insurance company to determine out of pocket expense. Advised may also receive vaccine at local pharmacy or Health Dept. Verbalized acceptance and understanding.  Tdap: Up to date   Flu Vaccine: Due for Flu vaccine. Does the patient want to receive this vaccine today?  No . Education has been provided regarding the importance of this vaccine but still declined. Advised may receive this vaccine at local pharmacy or Health Dept. Aware to provide a copy of the vaccination record if obtained from local pharmacy or Health Dept. Verbalized acceptance and understanding.  Pneumococcal Vaccine: Up to date   Screening Tests Health Maintenance  Topic Date Due   OPHTHALMOLOGY EXAM  06/01/2018   INFLUENZA VACCINE  11/24/2018   FOOT EXAM  12/19/2018   HEMOGLOBIN A1C  03/16/2019   TETANUS/TDAP  02/19/2025   PNA vac Low Risk Adult  Completed   Cancer Screenings:  Colorectal Screening: Not completed.No longer required.   Lung Cancer Screening: (Low Dose  CT Chest recommended if Age 68-80 years, 30 pack-year currently  smoking OR have quit w/in 15years.) does not qualify.   Additional Screening:  Hepatitis C Screening: no longer required  Vision Screening: Recommended annual ophthalmology exams for early detection of glaucoma and other disorders of the eye. Is the patient up to date with their annual eye exam?  No  Who is the provider or what is the name of the office in which the pt attends annual eye exams? [facility]   Dental Screening: Recommended annual dental exams for proper oral hygiene  Community Resource Referral:  CRR required this visit?  No       Plan:     I have personally reviewed and addressed the Medicare Annual Wellness questionnaire and have noted the following in the patients chart:  A. Medical and social history B. Use of alcohol, tobacco or illicit drugs  C. Current medications and supplements D. Functional ability and status E.  Nutritional status F.  Physical activity G. Advance directives H. List of other physicians I.  Hospitalizations, surgeries, and ER visits in previous 12 months J.  Vitals K. Screenings such as hearing and vision if needed, cognitive and depression L. Referrals and appointments   In addition, I have reviewed and discussed with patient certain preventive protocols, quality metrics, and best practice recommendations. A written personalized care plan for preventive services as well as general preventive health recommendations were provided to patient.   Signed,  Reather LittlerKasey Julene Rahn, LPN Nurse Health Advisor   Nurse Notes: pt c/o seeing spots and black lines in left eye. Advised pt to contact [facility] as he is past due for eye exam. Verified last diabetic eye exam 06/01/17 when patient was referred to retina specialist who he saw in May 2019. Pt otherwise doing well and appreciative of visit today.

## 2018-11-23 ENCOUNTER — Telehealth: Payer: Self-pay | Admitting: Family Medicine

## 2018-11-23 NOTE — Telephone Encounter (Signed)
All meds were sent in on 09/13/2018 to [facility] for 90 days with 1 refill= 6 months/ shouldn't need to send in more until November. All she should have to do is call [facility] and request the meds

## 2018-11-23 NOTE — Telephone Encounter (Signed)
[name] called about mr Eann need refills on all his meds.

## 2019-01-14 ENCOUNTER — Ambulatory Visit (INDEPENDENT_AMBULATORY_CARE_PROVIDER_SITE_OTHER): Payer: Medicare Other | Admitting: Family Medicine

## 2019-01-14 ENCOUNTER — Other Ambulatory Visit: Payer: Self-pay

## 2019-01-14 ENCOUNTER — Encounter: Payer: Self-pay | Admitting: Family Medicine

## 2019-01-14 VITALS — BP 130/60 | HR 80 | Ht 74.0 in | Wt 155.0 lb

## 2019-01-14 DIAGNOSIS — I10 Essential (primary) hypertension: Secondary | ICD-10-CM | POA: Diagnosis not present

## 2019-01-14 DIAGNOSIS — E1159 Type 2 diabetes mellitus with other circulatory complications: Secondary | ICD-10-CM | POA: Diagnosis not present

## 2019-01-14 DIAGNOSIS — D508 Other iron deficiency anemias: Secondary | ICD-10-CM | POA: Diagnosis not present

## 2019-01-14 DIAGNOSIS — J301 Allergic rhinitis due to pollen: Secondary | ICD-10-CM | POA: Diagnosis not present

## 2019-01-14 DIAGNOSIS — Z23 Encounter for immunization: Secondary | ICD-10-CM

## 2019-01-14 MED ORDER — SITAGLIPTIN PHOSPHATE 100 MG PO TABS: 100.0000 mg | ORAL_TABLET | Freq: Every day | ORAL | 1 refills | Status: DC

## 2019-01-14 MED ORDER — GLIPIZIDE ER 10 MG PO TB24: 10.0000 mg | ORAL_TABLET | Freq: Every day | ORAL | 1 refills | Status: DC

## 2019-01-14 MED ORDER — AMLODIPINE BESYLATE 10 MG PO TABS: 10.0000 mg | ORAL_TABLET | Freq: Every day | ORAL | 1 refills | Status: DC

## 2019-01-14 MED ORDER — LISINOPRIL 5 MG PO TABS: 5.0000 mg | ORAL_TABLET | Freq: Every day | ORAL | 1 refills | Status: DC

## 2019-01-14 MED ORDER — METFORMIN HCL 500 MG PO TABS: 500.0000 mg | ORAL_TABLET | Freq: Two times a day (BID) | ORAL | 1 refills | Status: DC

## 2019-01-14 MED ORDER — FERROUS SULFATE 325 (65 FE) MG PO TABS: 325.0000 mg | ORAL_TABLET | Freq: Every day | ORAL | 1 refills | Status: DC

## 2019-01-14 MED ORDER — LORATADINE 10 MG PO TABS: 10.0000 mg | ORAL_TABLET | Freq: Every day | ORAL | 1 refills | Status: DC

## 2019-01-14 MED ORDER — METOPROLOL TARTRATE 50 MG PO TABS: 50.0000 mg | ORAL_TABLET | Freq: Two times a day (BID) | ORAL | 1 refills | Status: DC

## 2019-01-14 MED ORDER — HYDROCHLOROTHIAZIDE 12.5 MG PO TABS: 12.5000 mg | ORAL_TABLET | Freq: Every day | ORAL | 1 refills | Status: DC

## 2019-01-14 NOTE — Progress Notes (Signed)
Date:  01/14/2019   Name:  Jose Schroeder   DOB:  02-15-1933   MRN:  [id number]   Chief Complaint: Anemia, Diabetes (added Januvia- recheck A1C), Hypertension, Allergic Rhinitis , and influenza vacc need  Anemia Presents for follow-up visit. There has been no abdominal pain, anorexia, bruising/bleeding easily, confusion, fever, leg swelling, light-headedness, malaise/fatigue, pallor, palpitations, paresthesias, pica or weight loss. Signs of blood loss that are not present include hematemesis, hematochezia, melena, menorrhagia and vaginal bleeding. There are no compliance problems.   Diabetes He presents for his follow-up diabetic visit. He has type 2 diabetes mellitus. His disease course has been stable. Pertinent negatives for hypoglycemia include no confusion, dizziness, headaches, hunger, mood changes, nervousness/anxiousness, pallor, seizures, sleepiness, speech difficulty, sweats or tremors. Pertinent negatives for diabetes include no blurred vision, no chest pain, no fatigue, no foot paresthesias, no foot ulcerations, no polydipsia, no polyphagia, no polyuria, no visual change, no weakness and no weight loss. There are no hypoglycemic complications. There are no diabetic complications. Current diabetic treatment includes oral agent (triple therapy). He is following a generally unhealthy diet. Meal planning includes avoidance of concentrated sweets. His breakfast blood glucose is taken between 8-9 am. An ACE inhibitor/angiotensin II receptor blocker is being taken.  Hypertension Pertinent negatives include no blurred vision, chest pain, headaches, malaise/fatigue, neck pain, palpitations, shortness of breath or sweats.    Review of Systems  Constitutional: Negative for chills, fatigue, fever, malaise/fatigue and weight loss.  HENT: Negative for drooling, ear discharge, ear pain and sore throat.   Eyes: Negative for blurred vision.  Respiratory: Negative for cough, shortness of breath  and wheezing.   Cardiovascular: Negative for chest pain, palpitations and leg swelling.  Gastrointestinal: Negative for abdominal pain, anorexia, blood in stool, constipation, diarrhea, hematemesis, hematochezia, melena and nausea.  Endocrine: Negative for polydipsia, polyphagia and polyuria.  Genitourinary: Negative for dysuria, frequency, hematuria, menorrhagia, urgency and vaginal bleeding.  Musculoskeletal: Negative for back pain, myalgias and neck pain.  Skin: Negative for pallor and rash.  Allergic/Immunologic: Negative for environmental allergies.  Neurological: Negative for dizziness, tremors, seizures, speech difficulty, weakness, light-headedness, headaches and paresthesias.  Hematological: Does not bruise/bleed easily.  Psychiatric/Behavioral: Negative for confusion and suicidal ideas. The patient is not nervous/anxious.     Patient Active Problem List   Diagnosis Date Noted  . Acquired absence of right leg below knee ([place]) 09/13/2018  . Amputated below knee, right ([place]) 05/09/2017  . PVD (peripheral vascular disease) ([place]) 12/15/2016  . Hx of BKA, left ([place]) 02/25/2016  . Ulcer of right foot ([place]) 01/31/2016  . Amputated great toe ([place]) 01/11/2016  . Toe infection 01/04/2016  . Type 2 diabetes mellitus without complication, without long-term current use of insulin ([place]) 09/24/2015  . Hypertension associated with diabetes ([place]) 09/24/2015    No Known Allergies  Past Surgical History:  Procedure Laterality Date  . AMPUTATION TOE Right 01/15/2016   Procedure: AMPUTATION TOE;  Surgeon: Samara Deist, DPM;  Location: [facility];  Service: Podiatry;  Laterality: Right;  . PERIPHERAL VASCULAR CATHETERIZATION Right 01/07/2016   Procedure: Abdominal Aortogram w/Lower Extremity;  Surgeon: Katha Cabal, MD;  Location: [facility];  Service: Cardiovascular;  Laterality: Right;  . TOE SURGERY     amputated 2 toes on L) foot    Social History   Tobacco Use  . Smoking  status: Never Smoker  . Smokeless tobacco: Never Used  Substance Use Topics  . Alcohol use: No    Alcohol/week:  0.0 standard drinks  . Drug use: No     Medication list has been reviewed and updated.  Current Meds  Medication Sig  . amLODipine (NORVASC) 10 MG tablet Take 1 tablet (10 mg total) by mouth daily.  Marland Kitchen aspirin EC 81 MG tablet Take 1 tablet (81 mg total) by mouth daily.  . ferrous sulfate 325 (65 FE) MG tablet Take 1 tablet (325 mg total) by mouth daily with breakfast.  . glipiZIDE (GLUCOTROL XL) 10 MG 24 hr tablet Take 1 tablet (10 mg total) by mouth daily with breakfast.  . glucose blood (ONE TOUCH ULTRA TEST) test strip USE AS DIRECTED BY PHYSICIAN  . hydrochlorothiazide (HYDRODIURIL) 12.5 MG tablet Take 1 tablet (12.5 mg total) by mouth daily.  Marland Kitchen lisinopril (ZESTRIL) 5 MG tablet Take 1 tablet (5 mg total) by mouth daily.  Marland Kitchen loratadine (CLARITIN) 10 MG tablet Take 1 tablet (10 mg total) by mouth daily.  . metFORMIN (GLUCOPHAGE) 500 MG tablet Take 1 tablet (500 mg total) by mouth 2 (two) times daily.  . metoprolol tartrate (LOPRESSOR) 50 MG tablet Take 1 tablet (50 mg total) by mouth 2 (two) times daily.  . sitaGLIPtin (JANUVIA) 100 MG tablet Take 1 tablet (100 mg total) by mouth daily.    PHQ 2/9 Scores 01/14/2019 09/24/2018 09/13/2018 05/14/2018  PHQ - 2 Score 0 0 0 0  PHQ- 9 Score 0 - 0 0    BP Readings from Last 3 Encounters:  01/14/19 130/60  09/24/18 (!) 142/78  09/13/18 138/80    Physical Exam HENT:     Head: Normocephalic.     Right Ear: External ear normal.     Left Ear: External ear normal.     Nose: Nose normal.  Eyes:     General: No scleral icterus.       Right eye: No discharge.        Left eye: No discharge.     Conjunctiva/sclera: Conjunctivae normal.     Pupils: Pupils are equal, round, and reactive to light.  Neck:     Musculoskeletal: Normal range of motion and neck supple.     Thyroid: No thyromegaly.     Vascular: No JVD.     Trachea: No  tracheal deviation.  Cardiovascular:     Rate and Rhythm: Normal rate and regular rhythm.     Heart sounds: Normal heart sounds. No murmur. No friction rub. No gallop.   Pulmonary:     Effort: No respiratory distress.     Breath sounds: Normal breath sounds. No wheezing or rales.  Abdominal:     General: Bowel sounds are normal.     Palpations: Abdomen is soft. There is no mass.     Tenderness: There is no abdominal tenderness. There is no guarding or rebound.  Musculoskeletal: Normal range of motion.        General: No tenderness.  Lymphadenopathy:     Cervical: No cervical adenopathy.  Skin:    General: Skin is warm.     Findings: No rash.  Neurological:     Mental Status: He is alert and oriented to person, place, and time.     Cranial Nerves: No cranial nerve deficit.     Deep Tendon Reflexes: Reflexes are normal and symmetric.     Wt Readings from Last 3 Encounters:  01/14/19 155 lb (70.3 kg)  09/24/18 161 lb 3.2 oz (73.1 kg)  09/13/18 161 lb (73 kg)    BP 130/60   Pulse  80   Ht 6\' 2"  (1.88 m)   Wt 155 lb (70.3 kg)   BMI 19.90 kg/m   Assessment and Plan:  1. Essential hypertension Chronic.  Controlled.  Continue amlodipine 10 mg and hydrochlorothiazide 12.5 mg, and lisinopril 5 mg once a day and metoprolol 50 mg 1 twice a day. - amLODipine (NORVASC) 10 MG tablet; Take 1 tablet (10 mg total) by mouth daily.  Dispense: 90 tablet; Refill: 1 - hydrochlorothiazide (HYDRODIURIL) 12.5 MG tablet; Take 1 tablet (12.5 mg total) by mouth daily.  Dispense: 90 tablet; Refill: 1 - lisinopril (ZESTRIL) 5 MG tablet; Take 1 tablet (5 mg total) by mouth daily.  Dispense: 90 tablet; Refill: 1 - metoprolol tartrate (LOPRESSOR) 50 MG tablet; Take 1 tablet (50 mg total) by mouth 2 (two) times daily.  Dispense: 180 tablet; Refill: 1 - Ambulatory referral to Podiatry  2. Other iron deficiency anemia Chronic.  Controlled.  Patient will continue ferrous sulfate 325 mg once a day. -  ferrous sulfate 325 (65 FE) MG tablet; Take 1 tablet (325 mg total) by mouth daily with breakfast.  Dispense: 90 tablet; Refill: 1  3. Type 2 diabetes mellitus with other circulatory complication, without long-term current use of insulin (HCC) Chronic.  Controlled.  Patient is now on triple therapy including Glucotrol 10 mg once a day metformin 500 mg twice a day and Januvia 100 mg once a day.  Patient is does not know what his blood sugars are running so we will get an A1c today to get some idea if this is come via her current regimen.  Patient is also been instructed I will be sending him to podiatry for follow-up of his left foot given the history of his amputation. - HgB A1c - glipiZIDE (GLUCOTROL XL) 10 MG 24 hr tablet; Take 1 tablet (10 mg total) by mouth daily with breakfast.  Dispense: 90 tablet; Refill: 1 - metFORMIN (GLUCOPHAGE) 500 MG tablet; Take 1 tablet (500 mg total) by mouth 2 (two) times daily.  Dispense: 180 tablet; Refill: 1 - sitaGLIPtin (JANUVIA) 100 MG tablet; Take 1 tablet (100 mg total) by mouth daily.  Dispense: 90 tablet; Refill: 1  4. Non-seasonal allergic rhinitis due to pollen Chronic.  Controlled.  Continue loratadine 10 mg once a day. - loratadine (CLARITIN) 10 MG tablet; Take 1 tablet (10 mg total) by mouth daily.  Dispense: 91 tablet; Refill: 1  5. Influenza vaccine needed Discussed and administered. - Flu Vaccine QUAD High Dose(Fluad)  6.PAD-patient with a history of a right BK amputation.  Patient has peripheral neuropathy involving the left foot for which he sees podiatry for toenail maintenance.  Patient has been referred to Dr. Ether GriffinsFowler for recheck of his left foot for diabetic concerns.

## 2019-01-15 LAB — HEMOGLOBIN A1C
Est. average glucose Bld gHb Est-mCnc: 128 mg/dL
Hgb A1c MFr Bld: 6.1 % — ABNORMAL HIGH (ref 4.8–5.6)

## 2019-01-28 DIAGNOSIS — E1151 Type 2 diabetes mellitus with diabetic peripheral angiopathy without gangrene: Secondary | ICD-10-CM | POA: Diagnosis not present

## 2019-01-28 DIAGNOSIS — B351 Tinea unguium: Secondary | ICD-10-CM | POA: Diagnosis not present

## 2019-01-28 DIAGNOSIS — Z89422 Acquired absence of other left toe(s): Secondary | ICD-10-CM | POA: Diagnosis not present

## 2019-01-28 DIAGNOSIS — S88111A Complete traumatic amputation at level between knee and ankle, right lower leg, initial encounter: Secondary | ICD-10-CM | POA: Diagnosis not present

## 2019-01-28 DIAGNOSIS — M79672 Pain in left foot: Secondary | ICD-10-CM | POA: Diagnosis not present

## 2019-05-17 ENCOUNTER — Encounter: Payer: Self-pay | Admitting: Family Medicine

## 2019-05-17 ENCOUNTER — Ambulatory Visit (INDEPENDENT_AMBULATORY_CARE_PROVIDER_SITE_OTHER): Payer: Medicare Other | Admitting: Family Medicine

## 2019-05-17 ENCOUNTER — Other Ambulatory Visit: Payer: Self-pay

## 2019-05-17 DIAGNOSIS — J301 Allergic rhinitis due to pollen: Secondary | ICD-10-CM

## 2019-05-17 DIAGNOSIS — I10 Essential (primary) hypertension: Secondary | ICD-10-CM | POA: Diagnosis not present

## 2019-05-17 DIAGNOSIS — E1159 Type 2 diabetes mellitus with other circulatory complications: Secondary | ICD-10-CM | POA: Diagnosis not present

## 2019-05-17 DIAGNOSIS — D508 Other iron deficiency anemias: Secondary | ICD-10-CM

## 2019-05-17 MED ORDER — METOPROLOL TARTRATE 50 MG PO TABS: 50.0000 mg | ORAL_TABLET | Freq: Two times a day (BID) | ORAL | 1 refills | Status: DC

## 2019-05-17 MED ORDER — AMLODIPINE BESYLATE 10 MG PO TABS: 10.0000 mg | ORAL_TABLET | Freq: Every day | ORAL | 1 refills | Status: DC

## 2019-05-17 MED ORDER — LORATADINE 10 MG PO TABS: 10.0000 mg | ORAL_TABLET | Freq: Every day | ORAL | 1 refills | Status: DC

## 2019-05-17 MED ORDER — GLIPIZIDE ER 10 MG PO TB24: 10.0000 mg | ORAL_TABLET | Freq: Every day | ORAL | 1 refills | Status: DC

## 2019-05-17 MED ORDER — LISINOPRIL 5 MG PO TABS: 5.0000 mg | ORAL_TABLET | Freq: Every day | ORAL | 1 refills | Status: DC

## 2019-05-17 MED ORDER — FERROUS SULFATE 325 (65 FE) MG PO TABS: 325.0000 mg | ORAL_TABLET | Freq: Every day | ORAL | 1 refills | Status: DC

## 2019-05-17 MED ORDER — SITAGLIPTIN PHOSPHATE 100 MG PO TABS: 100.0000 mg | ORAL_TABLET | Freq: Every day | ORAL | 1 refills | Status: DC

## 2019-05-17 MED ORDER — METFORMIN HCL 500 MG PO TABS: 500.0000 mg | ORAL_TABLET | Freq: Two times a day (BID) | ORAL | 1 refills | Status: DC

## 2019-05-17 MED ORDER — HYDROCHLOROTHIAZIDE 12.5 MG PO TABS: 12.5000 mg | ORAL_TABLET | Freq: Every day | ORAL | 1 refills | Status: DC

## 2019-05-17 NOTE — Progress Notes (Signed)
cbc    Date:  05/17/2019   Name:  Jose Schroeder   DOB:  15-Nov-1932   MRN:  [id number]   Chief Complaint: Diabetes, Hypertension, Anemia, and Allergic Rhinitis   Diabetes He presents for his follow-up diabetic visit. He has type 2 diabetes mellitus. His disease course has been stable. There are no hypoglycemic associated symptoms. Pertinent negatives for hypoglycemia include no confusion, dizziness, headaches, nervousness/anxiousness, pallor or sweats. Pertinent negatives for diabetes include no blurred vision, no chest pain, no fatigue, no foot paresthesias, no foot ulcerations, no polydipsia, no polyphagia, no polyuria, no visual change, no weakness and no weight loss. There are no hypoglycemic complications. Symptoms are stable. There are no diabetic complications. Pertinent negatives for diabetic complications include no CVA, PVD or retinopathy. Risk factors for coronary artery disease include diabetes mellitus and hypertension. Current diabetic treatment includes oral agent (triple therapy). He is compliant with treatment all of the time. His weight is stable. He is following a generally healthy diet. His home blood glucose trend is fluctuating minimally. His breakfast blood glucose is taken between 8-9 am. His breakfast blood glucose range is generally 110-130 mg/dl. An ACE inhibitor/angiotensin II receptor blocker is being taken. Eye exam is not current.  Hypertension This is a chronic problem. The current episode started more than 1 year ago. The problem has been waxing and waning since onset. The problem is controlled. Pertinent negatives include no anxiety, blurred vision, chest pain, headaches, malaise/fatigue, neck pain, orthopnea, palpitations, peripheral edema, PND, shortness of breath or sweats. There are no associated agents to hypertension. Risk factors for coronary artery disease include diabetes mellitus, dyslipidemia and post-menopausal state. Past treatments include beta  blockers, calcium channel blockers, ACE inhibitors and diuretics. The current treatment provides moderate improvement. There are no compliance problems.  There is no history of angina, kidney disease, CAD/MI, CVA, heart failure, left ventricular hypertrophy, PVD or retinopathy. There is no history of chronic renal disease, a hypertension causing med (hyperlip) or renovascular disease.  Anemia Presents for follow-up visit. There has been no abdominal pain, anorexia, bruising/bleeding easily, confusion, fever, leg swelling, light-headedness, malaise/fatigue, pallor, palpitations, paresthesias or weight loss. Signs of blood loss that are not present include hematochezia and melena. There is no history of chronic renal disease, heart failure or hypothyroidism. There are no compliance problems.   Hyperlipidemia This is a chronic problem. The current episode started more than 1 year ago. The problem is controlled (relatively). Recent lipid tests were reviewed and are normal. He has no history of chronic renal disease or hypothyroidism. Factors aggravating his hyperlipidemia include thiazides. Pertinent negatives include no chest pain, focal sensory loss, leg pain, myalgias or shortness of breath. Current antihyperlipidemic treatment includes diet change. The current treatment provides no improvement of lipids.  URI  This is a chronic (for allergies) problem. The current episode started more than 1 year ago. Pertinent negatives include no abdominal pain, chest pain, coughing, diarrhea, dysuria, ear pain, headaches, nausea, neck pain, rash, sore throat or wheezing.    Lab Results  Component Value Date   CREATININE 1.03 09/13/2018   BUN 14 09/13/2018   NA 136 09/13/2018   K 4.4 09/13/2018   CL 96 09/13/2018   CO2 24 09/13/2018   Lab Results  Component Value Date   CHOL 197 05/14/2018   HDL 59 05/14/2018   LDLCALC 120 (H) 05/14/2018   TRIG 92 05/14/2018   CHOLHDL 3.0 12/15/2016   No results found  for: TSH Lab Results  Component Value Date   HGBA1C 6.1 (H) 01/14/2019     Review of Systems  Constitutional: Negative for chills, fatigue, fever, malaise/fatigue and weight loss.  HENT: Negative for drooling, ear discharge, ear pain and sore throat.   Eyes: Negative for blurred vision.  Respiratory: Negative for cough, shortness of breath and wheezing.   Cardiovascular: Negative for chest pain, palpitations, orthopnea, leg swelling and PND.  Gastrointestinal: Negative for abdominal pain, anorexia, blood in stool, constipation, diarrhea, hematochezia, melena and nausea.  Endocrine: Negative for polydipsia, polyphagia and polyuria.  Genitourinary: Negative for dysuria, frequency, hematuria and urgency.  Musculoskeletal: Negative for back pain, myalgias and neck pain.  Skin: Negative for pallor and rash.  Allergic/Immunologic: Negative for environmental allergies.  Neurological: Negative for dizziness, weakness, light-headedness, headaches and paresthesias.  Hematological: Does not bruise/bleed easily.  Psychiatric/Behavioral: Negative for confusion and suicidal ideas. The patient is not nervous/anxious.     Patient Active Problem List   Diagnosis Date Noted  . 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 Amputated below knee, right (HCC) 05/09/2017  . PVD (peripheral vascular disease) (HCC) 12/15/2016  . 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 Ulcer of right foot (HCC) 01/31/2016  . Amputated great toe (HCC) 01/11/2016  . Toe infection 01/04/2016  . Type 2 diabetes mellitus without complication, without long-term current use of insulin (HCC) 09/24/2015  . Hypertension associated with diabetes (HCC) 09/24/2015    No Known Allergies  Past Surgical History:  Procedure Laterality Date  . AMPUTATION TOE Right 01/15/2016   Procedure: AMPUTATION TOE;  Surgeon: Gwyneth Revels, DPM;  Location: [facility];  Service: Podiatry;  Laterality: Right;  . PERIPHERAL VASCULAR  CATHETERIZATION Right 01/07/2016   Procedure: Abdominal Aortogram w/Lower Extremity;  Surgeon: Renford Dills, MD;  Location: [facility];  Service: Cardiovascular;  Laterality: Right;  . TOE SURGERY     amputated 2 toes on L) foot    Social History   Tobacco Use  . Smoking status: Never Smoker  . Smokeless tobacco: Never Used  Substance Use Topics  . Alcohol use: No    Alcohol/week: 0.0 standard drinks  . Drug use: No     Medication list has been reviewed and updated.  Current Meds  Medication Sig  . amLODipine (NORVASC) 10 MG tablet Take 1 tablet (10 mg total) by mouth daily.  Marland Kitchen aspirin EC 81 MG tablet Take 1 tablet (81 mg total) by mouth daily.  . ferrous sulfate 325 (65 FE) MG tablet Take 1 tablet (325 mg total) by mouth daily with breakfast.  . glipiZIDE (GLUCOTROL XL) 10 MG 24 hr tablet Take 1 tablet (10 mg total) by mouth daily with breakfast.  . glucose blood (ONE TOUCH ULTRA TEST) test strip USE AS DIRECTED BY PHYSICIAN  . hydrochlorothiazide (HYDRODIURIL) 12.5 MG tablet Take 1 tablet (12.5 mg total) by mouth daily.  Marland Kitchen lisinopril (ZESTRIL) 5 MG tablet Take 1 tablet (5 mg total) by mouth daily.  Marland Kitchen loratadine (CLARITIN) 10 MG tablet Take 1 tablet (10 mg total) by mouth daily.  . metFORMIN (GLUCOPHAGE) 500 MG tablet Take 1 tablet (500 mg total) by mouth 2 (two) times daily.  . metoprolol tartrate (LOPRESSOR) 50 MG tablet Take 1 tablet (50 mg total) by mouth 2 (two) times daily.  . sitaGLIPtin (JANUVIA) 100 MG tablet Take 1 tablet (100 mg total) by mouth daily.    PHQ 2/9 Scores 01/14/2019 09/24/2018 09/13/2018 05/14/2018  PHQ - 2 Score 0 0  0 0  PHQ- 9 Score 0 - 0 0    BP Readings from Last 3 Encounters:  05/17/19 130/80  01/14/19 130/60  09/24/18 (!) 142/78    Physical Exam Vitals and nursing note reviewed.  HENT:     Head: Normocephalic.     Right Ear: Tympanic membrane, ear canal and external ear normal.     Left Ear: Tympanic membrane, ear canal and  external ear normal.     Nose: Nose normal.  Eyes:     General: No scleral icterus.       Right eye: No discharge.        Left eye: No discharge.     Conjunctiva/sclera: Conjunctivae normal.     Pupils: Pupils are equal, round, and reactive to light.  Neck:     Thyroid: No thyromegaly.     Vascular: No JVD.     Trachea: No tracheal deviation.  Cardiovascular:     Rate and Rhythm: Normal rate and regular rhythm.     Chest Wall: PMI is not displaced. No thrill.     Pulses: Normal pulses.     Heart sounds: Normal heart sounds, S1 normal and S2 normal. No murmur. No systolic murmur. No diastolic murmur. No friction rub. No gallop. No S3 or S4 sounds.   Pulmonary:     Effort: No respiratory distress.     Breath sounds: Normal breath sounds. No wheezing or rales.  Abdominal:     General: Bowel sounds are normal.     Palpations: Abdomen is soft. There is no mass.     Tenderness: There is no abdominal tenderness. There is no guarding or rebound.  Musculoskeletal:        General: No tenderness. Normal range of motion.     Cervical back: Normal range of motion and neck supple.     Right lower leg: No edema.     Left lower leg: No edema.  Lymphadenopathy:     Cervical: No cervical adenopathy.  Skin:    General: Skin is warm.     Findings: No rash.  Neurological:     Mental Status: He is alert and oriented to person, place, and time.     Cranial Nerves: No cranial nerve deficit.     Deep Tendon Reflexes: Reflexes are normal and symmetric.     Wt Readings from Last 3 Encounters:  05/17/19 160 lb (72.6 kg)  01/14/19 155 lb (70.3 kg)  09/24/18 161 lb 3.2 oz (73.1 kg)    BP 130/80   Pulse 80   Ht 6\' 2"  (1.88 m)   Wt 160 lb (72.6 kg)   BMI 20.54 kg/m   Assessment and Plan: 1. Type 2 diabetes mellitus with other circulatory complication, without long-term current use of insulin (HCC) Chronic.  Controlled.  Stable.  Continue Januvia 100 mg once a day, Metformin 500 mg twice a  day, and glipizide XL 10 mg once a day.  Will check CMP, hemoglobin A1c, and microalbuminuria.  We will recheck patient in 6 months - sitaGLIPtin (JANUVIA) 100 MG tablet; Take 1 tablet (100 mg total) by mouth daily.  Dispense: 90 tablet; Refill: 1 - metFORMIN (GLUCOPHAGE) 500 MG tablet; Take 1 tablet (500 mg total) by mouth 2 (two) times daily.  Dispense: 180 tablet; Refill: 1 - glipiZIDE (GLUCOTROL XL) 10 MG 24 hr tablet; Take 1 tablet (10 mg total) by mouth daily with breakfast.  Dispense: 90 tablet; Refill: 1 - HgB A1c - Microalbumin, urine -  Comprehensive Metabolic Panel (CMET)  2. Essential hypertension Chronic.  Controlled.  Stable.  Continue metoprolol 50 mg twice a day, lisinopril 5 mg once a day, hydrochlorothiazide 12.5 mg once a day, and amlodipine 10 mg once a day.  Will check CMP to assess GFR. - metoprolol tartrate (LOPRESSOR) 50 MG tablet; Take 1 tablet (50 mg total) by mouth 2 (two) times daily.  Dispense: 180 tablet; Refill: 1 - lisinopril (ZESTRIL) 5 MG tablet; Take 1 tablet (5 mg total) by mouth daily.  Dispense: 90 tablet; Refill: 1 - hydrochlorothiazide (HYDRODIURIL) 12.5 MG tablet; Take 1 tablet (12.5 mg total) by mouth daily.  Dispense: 90 tablet; Refill: 1 - amLODipine (NORVASC) 10 MG tablet; Take 1 tablet (10 mg total) by mouth daily.  Dispense: 90 tablet; Refill: 1 - Comprehensive Metabolic Panel (CMET)  3. Non-seasonal allergic rhinitis due to pollen Intermittent.  Controlled.  Recently stable until pollen season.  Patient has nonseasonal allergic rhinitis for which he takes Claritin 10 mg once a day. - loratadine (CLARITIN) 10 MG tablet; Take 1 tablet (10 mg total) by mouth daily.  Dispense: 91 tablet; Refill: 1  4. Other iron deficiency anemia Patient has a history of iron deficiency anemia for which it is controlled on 325 mg ferrous sulfate.  Will obtain a CBC to assess hemoglobin.  And continue on current iron supplementation. - ferrous sulfate 325 (65 FE) MG  tablet; Take 1 tablet (325 mg total) by mouth daily with breakfast.  Dispense: 90 tablet; Refill: 1 - CBC with Differential/Platelet

## 2019-05-18 LAB — COMPREHENSIVE METABOLIC PANEL
ALT: 7 IU/L (ref 0–44)
AST: 17 IU/L (ref 0–40)
Albumin/Globulin Ratio: 1.6 (ref 1.2–2.2)
Albumin: 4.6 g/dL (ref 3.6–4.6)
Alkaline Phosphatase: 53 IU/L (ref 39–117)
BUN/Creatinine Ratio: 13 (ref 10–24)
BUN: 13 mg/dL (ref 8–27)
Bilirubin Total: 0.3 mg/dL (ref 0.0–1.2)
CO2: 25 mmol/L (ref 20–29)
Calcium: 10 mg/dL (ref 8.6–10.2)
Chloride: 101 mmol/L (ref 96–106)
Creatinine, Ser: 1.02 mg/dL (ref 0.76–1.27)
GFR calc Af Amer: 78 mL/min/{1.73_m2} (ref >59)
GFR calc non Af Amer: 67 mL/min/{1.73_m2} (ref >59)
Globulin, Total: 2.9 g/dL (ref 1.5–4.5)
Glucose: 174 mg/dL — ABNORMAL HIGH (ref 65–99)
Potassium: 4.6 mmol/L (ref 3.5–5.2)
Sodium: 141 mmol/L (ref 134–144)
Total Protein: 7.5 g/dL (ref 6.0–8.5)

## 2019-05-18 LAB — CBC WITH DIFFERENTIAL/PLATELET
Basophils Absolute: 0 10*3/uL (ref 0.0–0.2)
Basos: 1 %
EOS (ABSOLUTE): 0.1 10*3/uL (ref 0.0–0.4)
Eos: 2 %
Hematocrit: 33.2 % — ABNORMAL LOW (ref 37.5–51.0)
Hemoglobin: 11.2 g/dL — ABNORMAL LOW (ref 13.0–17.7)
Immature Grans (Abs): 0 10*3/uL (ref 0.0–0.1)
Immature Granulocytes: 0 %
Lymphocytes Absolute: 1.9 10*3/uL (ref 0.7–3.1)
Lymphs: 31 %
MCH: 29.5 pg (ref 26.6–33.0)
MCHC: 33.7 g/dL (ref 31.5–35.7)
MCV: 87 fL (ref 79–97)
Monocytes Absolute: 0.5 10*3/uL (ref 0.1–0.9)
Monocytes: 8 %
Neutrophils Absolute: 3.6 10*3/uL (ref 1.4–7.0)
Neutrophils: 58 %
Platelets: 260 10*3/uL (ref 150–450)
RBC: 3.8 x10E6/uL — ABNORMAL LOW (ref 4.14–5.80)
RDW: 11.9 % (ref 11.6–15.4)
WBC: 6.2 10*3/uL (ref 3.4–10.8)

## 2019-05-18 LAB — HEMOGLOBIN A1C
Est. average glucose Bld gHb Est-mCnc: 137 mg/dL
Hgb A1c MFr Bld: 6.4 % — ABNORMAL HIGH (ref 4.8–5.6)

## 2019-05-18 LAB — MICROALBUMIN, URINE: Microalbumin, Urine: 11.8 ug/mL

## 2019-05-31 ENCOUNTER — Other Ambulatory Visit: Payer: Self-pay

## 2019-05-31 DIAGNOSIS — E1159 Type 2 diabetes mellitus with other circulatory complications: Secondary | ICD-10-CM

## 2019-05-31 MED ORDER — GLIPIZIDE ER 10 MG PO TB24: 10.0000 mg | ORAL_TABLET | Freq: Every day | ORAL | 0 refills | Status: DC

## 2019-05-31 NOTE — Progress Notes (Unsigned)
Sent in Glipizide to [facility] [address]

## 2019-06-22 DIAGNOSIS — Z23 Encounter for immunization: Secondary | ICD-10-CM | POA: Diagnosis not present

## 2019-08-15 DIAGNOSIS — E113599 Type 2 diabetes mellitus with proliferative diabetic retinopathy without macular edema, unspecified eye: Secondary | ICD-10-CM | POA: Diagnosis not present

## 2019-08-15 LAB — HM DIABETES EYE EXAM

## 2019-08-22 ENCOUNTER — Other Ambulatory Visit: Payer: Self-pay

## 2019-08-22 NOTE — Progress Notes (Unsigned)
Entered eye exam 

## 2019-08-26 ENCOUNTER — Other Ambulatory Visit: Payer: Self-pay | Admitting: Family Medicine

## 2019-08-26 ENCOUNTER — Telehealth: Payer: Self-pay | Admitting: Family Medicine

## 2019-08-26 DIAGNOSIS — I10 Essential (primary) hypertension: Secondary | ICD-10-CM

## 2019-08-26 DIAGNOSIS — E1159 Type 2 diabetes mellitus with other circulatory complications: Secondary | ICD-10-CM

## 2019-08-26 MED ORDER — LISINOPRIL 5 MG PO TABS: 5.0000 mg | ORAL_TABLET | Freq: Every day | ORAL | 0 refills | Status: DC

## 2019-08-26 MED ORDER — AMLODIPINE BESYLATE 10 MG PO TABS: 10.0000 mg | ORAL_TABLET | Freq: Every day | ORAL | 0 refills | Status: DC

## 2019-08-26 MED ORDER — METOPROLOL TARTRATE 50 MG PO TABS: 50.0000 mg | ORAL_TABLET | Freq: Two times a day (BID) | ORAL | 0 refills | Status: DC

## 2019-08-26 MED ORDER — METFORMIN HCL 500 MG PO TABS: 500.0000 mg | ORAL_TABLET | Freq: Two times a day (BID) | ORAL | 0 refills | Status: DC

## 2019-08-26 MED ORDER — GLIPIZIDE ER 10 MG PO TB24: 10.0000 mg | ORAL_TABLET | Freq: Every day | ORAL | 0 refills | Status: DC

## 2019-08-26 MED ORDER — SITAGLIPTIN PHOSPHATE 100 MG PO TABS: 100.0000 mg | ORAL_TABLET | Freq: Every day | ORAL | 0 refills | Status: DC

## 2019-08-26 MED ORDER — HYDROCHLOROTHIAZIDE 12.5 MG PO TABS: 12.5000 mg | ORAL_TABLET | Freq: Every day | ORAL | 0 refills | Status: DC

## 2019-08-26 NOTE — Telephone Encounter (Signed)
hydrochlorothiazide (HYDRODIURIL) 12.5 MG tablet  metFORMIN (GLUCOPHAGE) 500 MG tablet  amLODipine (NORVASC) 10 MG tablet  metoprolol tartrate (LOPRESSOR) 50 MG tablet lisinopril (ZESTRIL) 5 MG tablet  sitaGLIPtin (JANUVIA) 100 MG tablet  glipiZIDE (GLUCOTROL XL) 10 MG 24 hr tablet    Patient requesting refills of all medications.

## 2019-08-26 NOTE — Telephone Encounter (Unsigned)
Copied from CRM [id number]. Topic: General - Other >> Aug 26, 2019 12:06 PM Jose Schroeder wrote: Reason for CRM: Pts wife called and is requesting to have nurse give pt a call back. Please advise.

## 2019-08-26 NOTE — Telephone Encounter (Signed)
Meds sent to [facility] and I spoke to wife concerning appt for June

## 2019-08-26 NOTE — Telephone Encounter (Signed)
Unable to grant request- not provider with specific medications

## 2019-08-26 NOTE — Telephone Encounter (Signed)
I spoke to wife and scheduled appt for June

## 2019-08-26 NOTE — Telephone Encounter (Signed)
Requested Prescriptions  Pending Prescriptions Disp Refills  . amLODipine (NORVASC) 10 MG tablet 90 tablet 0    Sig: Take 1 tablet (10 mg total) by mouth daily.     Cardiovascular:  Calcium Channel Blockers Passed - 08/26/2019  2:47 PM      Passed - Last BP in normal range    BP Readings from Last 1 Encounters:  05/17/19 130/80         Passed - Valid encounter within last 6 months    Recent Outpatient Visits          3 months ago Type 2 diabetes mellitus with other circulatory complication, without long-term current use of insulin ([place])   [facility] Juline Patch, MD   7 months ago Essential hypertension   [facility] Juline Patch, MD   11 months ago Essential hypertension   [facility] Juline Patch, [facility]   1 year ago Non-seasonal allergic rhinitis due to pollen   [facility] Juline Patch, MD   1 year ago Type 2 diabetes mellitus with other circulatory complication, without long-term current use of insulin ([place])   [facility] Juline Patch, MD             . metFORMIN (GLUCOPHAGE) 500 MG tablet 180 tablet 0    Sig: Take 1 tablet (500 mg total) by mouth 2 (two) times daily.     Endocrinology:  Diabetes - Biguanides Passed - 08/26/2019  2:47 PM      Passed - Cr in normal range and within 360 days    Creatinine  Date Value Ref Range Status  11/12/2012 0.99 0.60 - 1.30 mg/dL Final   Creatinine, Ser  Date Value Ref Range Status  05/17/2019 1.02 0.76 - 1.27 mg/dL Final         Passed - HBA1C is between 0 and 7.9 and within 180 days    Hgb A1c MFr Bld  Date Value Ref Range Status  05/17/2019 6.4 (H) 4.8 - 5.6 % Final    Comment:             Prediabetes: 5.7 - 6.4          Diabetes: >6.4          Glycemic control for adults with diabetes: <7.0          Passed - eGFR in normal range and within 360 days    EGFR (African American)  Date Value Ref Range Status  11/12/2012 >60  Final   GFR calc Af Amer  Date  Value Ref Range Status  05/17/2019 78 >59 mL/min/1.73 Final   EGFR (Non-African Amer.)  Date Value Ref Range Status  11/12/2012 >60  Final    Comment:    eGFR values <31m/min/1.73 m2 may be an indication of chronic kidney disease (CKD). Calculated eGFR is useful in patients with stable renal function. The eGFR calculation will not be reliable in acutely ill patients when serum creatinine is changing rapidly. It is not useful in  patients on dialysis. The eGFR calculation may not be applicable to patients at the low and high extremes of body sizes, pregnant women, and vegetarians.    GFR calc non Af Amer  Date Value Ref Range Status  05/17/2019 67 >59 mL/min/1.73 Final         Passed - Valid encounter within last 6 months    Recent Outpatient Visits  3 months ago Type 2 diabetes mellitus with other circulatory complication, without long-term current use of insulin ([place])   [facility], MD   7 months ago Essential hypertension   [facility], [facility]   11 months ago Essential hypertension   [facility] Jul[facility] Patch, MD   1 year ago Non-seasonal allergic rhinitis due to pollen   [facility] Juline Patch, MD   1 year ago Type 2 diabetes mellitus with other circulatory complication, without long-term current use of insulin ([place])   [facility] Juline Patch, MD             . hydrochlorothiazide (HYDRODIURIL) 12.5 MG tablet 90 tablet 0    Sig: Take 1 tablet (12.5 mg total) by mouth daily.     Cardiovascular: Diuretics - Thiazide Passed - 08/26/2019  2:47 PM      Passed - Ca in normal range and within 360 days    Calcium  Date Value Ref Range Status  05/17/2019 10.0 8.6 - 10.2 mg/dL Final   Calcium, Total  Date Value Ref Range Status  11/12/2012 9.4 8.5 - 10.1 mg/dL Final         Passed - Cr in normal range and within 360 days    Creatinine  Date Value Ref Range Status   11/12/2012 0.99 0.60 - 1.30 mg/dL Final   Creatinine, Ser  Date Value Ref Range Status  05/17/2019 1.02 0.76 - 1.27 mg/dL Final         Passed - K in normal range and within 360 days    Potassium  Date Value Ref Range Status  05/17/2019 4.6 3.5 - 5.2 mmol/L Final  11/12/2012 4.0 3.5 - 5.1 mmol/L Final         Passed - Na in normal range and within 360 days    Sodium  Date Value Ref Range Status  05/17/2019 141 134 - 144 mmol/L Final  11/12/2012 141 136 - 145 mmol/L Final         Passed - Last BP in normal range    BP Readings from Last 1 Encounters:  05/17/19 130/80         Passed - Valid encounter within last 6 months    Recent Outpatient Visits          3 months ago Type 2 diabetes mellitus with other circulatory complication, without long-term current use of insulin ([place])   [facility], [facility]   7 months ago Essential hypertension   [facility] Patch, [facility]   11 months ago Essential hypertension   [facility] Jul[facility] Patch, [facility]   1 year ago Non-seasonal allergic rhinitis due to pollen   [facility] Juline Patch, MD   1 year ago Type 2 diabetes mellitus with other circulatory complication, without long-term current use of insulin ([place])   [facility] Juline Patch, MD             . metoprolol tartrate (LOPRESSOR) 50 MG tablet 180 tablet 0    Sig: Take 1 tablet (50 mg total) by mouth 2 (two) times daily.     Cardiovascular:  Beta Blockers Passed - 08/26/2019  2:47 PM      Passed - Last BP in normal range    BP Readings from Last 1 Encounters:  05/17/19 130/80  Passed - Last Heart Rate in normal range    Pulse Readings from Last 1 Encounters:  05/17/19 80         Passed - Valid encounter within last 6 months    Recent Outpatient Visits          3 months ago Type 2 diabetes mellitus with other circulatory complication, without long-term current use of insulin ([place])   [facility] Jul[facility] Patch, MD   7 months ago Essential hypertension   [facility], MD   11 months ago Essential hypertension   [facility] Jul[facility] Patch, MD   1 year ago Non-seasonal allergic rhinitis due to pollen   [facility] Jul[facility], [facility]   1 year ago Type 2 diabetes mellitus with other circulatory complication, without long-term current use of insulin ([place])   [facility] Juline Patch, MD             . lisinopril (ZESTRIL) 5 MG tablet 90 tablet 0    Sig: Take 1 tablet (5 mg total) by mouth daily.     Cardiovascular:  ACE Inhibitors Passed - 08/26/2019  2:47 PM      Passed - Cr in normal range and within 180 days    Creatinine  Date Value Ref Range Status  11/12/2012 0.99 0.60 - 1.30 mg/dL Final   Creatinine, Ser  Date Value Ref Range Status  05/17/2019 1.02 0.76 - 1.27 mg/dL Final         Passed - K in normal range and within 180 days    Potassium  Date Value Ref Range Status  05/17/2019 4.6 3.5 - 5.2 mmol/L Final  11/12/2012 4.0 3.5 - 5.1 mmol/L Final         Passed - Patient is not pregnant      Passed - Last BP in normal range    BP Readings from Last 1 Encounters:  05/17/19 130/80         Passed - Valid encounter within last 6 months    Recent Outpatient Visits          3 months ago Type 2 diabetes mellitus with other circulatory complication, without long-term current use of insulin ([place])   [facility], [facility]   7 months ago Essential hypertension   [facility] Patch, [facility]   11 months ago Essential hypertension   [facility] Jul[facility] Patch, MD   1 year ago Non-seasonal allergic rhinitis due to pollen   [facility] Juline Patch, MD   1 year ago Type 2 diabetes mellitus with other circulatory complication, without long-term current use of insulin ([place])   [facility] Juline Patch, MD             .  sitaGLIPtin (JANUVIA) 100 MG tablet 90 tablet 0    Sig: Take 1 tablet (100 mg total) by mouth daily.     Endocrinology:  Diabetes - DPP-4 Inhibitors Passed - 08/26/2019  2:47 PM      Passed - HBA1C is between 0 and 7.9 and within 180 days    Hgb A1c MFr Bld  Date Value Ref Range Status  05/17/2019 6.4 (H) 4.8 - 5.6 % Final    Comment:             Prediabetes: 5.7 - 6.4          Diabetes: >  6.4          Glycemic control for adults with diabetes: <7.0          Passed - Cr in normal range and within 360 days    Creatinine  Date Value Ref Range Status  11/12/2012 0.99 0.60 - 1.30 mg/dL Final   Creatinine, Ser  Date Value Ref Range Status  05/17/2019 1.02 0.76 - 1.27 mg/dL Final         Passed - Valid encounter within last 6 months    Recent Outpatient Visits          3 months ago Type 2 diabetes mellitus with other circulatory complication, without long-term current use of insulin ([place])   [facility], [facility]   7 months ago Essential hypertension   [facility] Patch, MD   11 months ago Essential hypertension   [facility] Jul[facility] Patch, MD   1 year ago Non-seasonal allergic rhinitis due to pollen   [facility] Jul[facility], MD   1 year ago Type 2 diabetes mellitus with other circulatory complication, without long-term current use of insulin ([place])   [facility] Juline Patch, MD             . glipiZIDE (GLUCOTROL XL) 10 MG 24 hr tablet 90 tablet 0    Sig: Take 1 tablet (10 mg total) by mouth daily with breakfast.     Endocrinology:  Diabetes - Sulfonylureas Passed - 08/26/2019  2:47 PM      Passed - HBA1C is between 0 and 7.9 and within 180 days    Hgb A1c MFr Bld  Date Value Ref Range Status  05/17/2019 6.4 (H) 4.8 - 5.6 % Final    Comment:             Prediabetes: 5.7 - 6.4          Diabetes: >6.4          Glycemic control for adults with diabetes: <7.0          Passed - Valid encounter  within last 6 months    Recent Outpatient Visits          3 months ago Type 2 diabetes mellitus with other circulatory complication, without long-term current use of insulin ([place])   [facility] Jul[facility] Patch, [facility]   7 months ago Essential hypertension   [facility] Jul[facility] Patch, MD   11 months ago Essential hypertension   [facility] Juline Patch, MD   1 year ago Non-seasonal allergic rhinitis due to pollen   [facility] Juline Patch, MD   1 year ago Type 2 diabetes mellitus with other circulatory complication, without long-term current use of insulin [facility])   [facility] Juline Patch, MD

## 2019-08-26 NOTE — Telephone Encounter (Signed)
Medication Refill - Medication: Pts wife called and is requesting to have all of his medications filled. She would not look up the names. Please advise.   Has the patient contacted their pharmacy? Yes.   (Agent: If no, request that the patient contact the pharmacy for the refill.) (Agent: If yes, when and what did the pharmacy advise?)  Preferred Pharmacy (with phone number or street name):  [facility] - [place], [place] - [address] [facility]  [address] [place] [address] [place] [address]  Phone: [phone] Fax: [phone]  Not a 24 hour pharmacy; exact hours not known.     Agent: Please be advised that RX refills may take up to 3 business days. We ask that you follow-up with your pharmacy.

## 2019-09-30 ENCOUNTER — Other Ambulatory Visit: Payer: Self-pay

## 2019-09-30 ENCOUNTER — Ambulatory Visit (INDEPENDENT_AMBULATORY_CARE_PROVIDER_SITE_OTHER): Payer: Medicare Other

## 2019-09-30 VITALS — BP 170/90 | HR 82 | Resp 16 | Ht 74.0 in | Wt 161.4 lb

## 2019-09-30 DIAGNOSIS — Z0000 Encounter for general adult medical examination without abnormal findings: Secondary | ICD-10-CM | POA: Diagnosis not present

## 2019-09-30 NOTE — Progress Notes (Signed)
Subjective:   Jose Schroeder is a 86 y.o. male who presents for Medicare Annual/Subsequent preventive examination.  Review of Systems:   Cardiac Risk Factors include: advanced age (>32men, >27 women);diabetes mellitus;dyslipidemia;male gender;hypertension;sedentary lifestyle     Objective:    Vitals: Pulse 82   Resp 16   Ht 6\' 2"  (1.88 m)   Wt 161 lb 6.4 oz (73.2 kg)   SpO2 98%   BMI 20.72 kg/m   Body mass index is 20.72 kg/m.  Advanced Directives 09/30/2019 09/24/2018 07/27/2016 01/15/2016 01/14/2016 01/04/2016 02/20/2015  Does Patient Have a Medical Advance Directive? No No No No No No No  Would patient like information on creating a medical advance directive? No - Patient declined Yes (MAU/Ambulatory/Procedural Areas - Information given) No - Patient declined No - patient declined information No - patient declined information No - patient declined information No - patient declined information    Tobacco Social History   Tobacco Use  Smoking Status Never Smoker  Smokeless Tobacco Never Used     Counseling given: Not Answered   Clinical Intake:  Pre-visit preparation completed: Yes  Pain : 0-10 Pain Score: 5  Pain Type: Chronic pain Pain Location: Leg Pain Orientation: Right Pain Descriptors / Indicators: Aching, Sore Pain Onset: More than a month ago Pain Frequency: Constant     BMI - recorded: 20.72 Nutritional Status: BMI of 19-24  Normal Nutritional Risks: None Diabetes: Yes CBG done?: No Did pt. bring in CBG monitor from home?: No   Nutrition Risk Assessment:  Has the patient had any N/V/D within the last 2 months?  No  Does the patient have any non-healing wounds?  No  Has the patient had any unintentional weight loss or weight gain?  No   Diabetes:  Is the patient diabetic?  Yes  If diabetic, was a CBG obtained today?  No  Did the patient bring in their glucometer from home?  No  How often do you monitor your CBG's? Daily .   Financial  Strains and Diabetes Management:  Are you having any financial strains with the device, your supplies or your medication? No .  Does the patient want to be seen by Chronic Care Management for management of their diabetes?  No  Would the patient like to be referred to a Nutritionist or for Diabetic Management?  No   Diabetic Exams:  Diabetic Eye Exam: Completed 08/15/19.   Diabetic Foot Exam: Completed 12/18/17. Pt has been advised about the importance in completing this exam. Pt is scheduled for diabetic foot exam on 10/01/19.    How often do you need to have someone help you when you read instructions, pamphlets, or other written materials from your doctor or pharmacy?: 1 - Never  Interpreter Needed?: No  Information entered by :: [email or web address] LPN  Past Medical History:  Diagnosis Date  . Diabetes mellitus without complication (HCC)   . Hypertension   . Peripheral vascular disease [facility])    Past Surgical History:  Procedure Laterality Date  . AMPUTATION TOE Right 01/15/2016   Procedure: AMPUTATION TOE;  Surgeon: 01/17/2016, DPM;  Location: [facility] ORS;  Service: Podiatry;  Laterality: Right;  . PERIPHERAL VASCULAR CATHETERIZATION Right 01/07/2016   Procedure: Abdominal Aortogram w/Lower Extremity;  Surgeon: 01/09/2016, MD;  Location: [facility] INVASIVE CV LAB;  Service: Cardiovascular;  Laterality: Right;  . TOE SURGERY     amputated 2 toes on L) foot   Family History  Problem Relation Age of Onset  .  Diabetes Mother   . Hypertension Mother   . Diabetes Father   . Hypertension Father   . Diabetes Daughter    Social History   Socioeconomic History  . Marital status: Married    Spouse name: Not on file  . Number of children: 4  . Years of education: Not on file  . Highest education level: 8th grade  Occupational History  . Occupation: retired  Tobacco Use  . Smoking status: Never Smoker  . Smokeless tobacco: Never Used  Substance and Sexual Activity  . Alcohol use: No     Alcohol/week: 0.0 standard drinks  . Drug use: No  . Sexual activity: Not Currently  Other Topics Concern  . Not on file  Social History Narrative  . Not on file   Social Determinants of Health   Financial Resource Strain: High Risk  . Difficulty of Paying Living Expenses: Hard  Food Insecurity: No Food Insecurity  . Worried About Programme researcher, broadcasting/film/video in the Last Year: Never true  . Ran Out of Food in the Last Year: Never true  Transportation Needs: No Transportation Needs  . Lack of Transportation (Medical): No  . Lack of Transportation (Non-Medical): No  Physical Activity: Inactive  . Days of Exercise per Week: 0 days  . Minutes of Exercise per Session: 0 min  Stress:   . Feeling of Stress :   Social Connections: Somewhat Isolated  . Frequency of Communication with Friends and Family: More than three times a week  . Frequency of Social Gatherings with Friends and Family: More than three times a week  . Attends Religious Services: Never  . Active Member of Clubs or Organizations: No  . Attends Banker Meetings: Never  . Marital Status: Married    Outpatient Encounter Medications as of 09/30/2019  Medication Sig  . amLODipine (NORVASC) 10 MG tablet Take 1 tablet (10 mg total) by mouth daily.  Marland Kitchen aspirin EC 81 MG tablet Take 1 tablet (81 mg total) by mouth daily.  . ferrous sulfate 325 (65 FE) MG tablet Take 1 tablet (325 mg total) by mouth daily with breakfast.  . glipiZIDE (GLUCOTROL XL) 10 MG 24 hr tablet Take 1 tablet (10 mg total) by mouth daily with breakfast.  . glucose blood (ONE TOUCH ULTRA TEST) test strip USE AS DIRECTED BY PHYSICIAN  . hydrochlorothiazide (HYDRODIURIL) 12.5 MG tablet Take 1 tablet (12.5 mg total) by mouth daily.  Marland Kitchen lisinopril (ZESTRIL) 5 MG tablet Take 1 tablet (5 mg total) by mouth daily.  Marland Kitchen loratadine (CLARITIN) 10 MG tablet Take 1 tablet (10 mg total) by mouth daily.  . metFORMIN (GLUCOPHAGE) 500 MG tablet Take 1 tablet (500 mg  total) by mouth 2 (two) times daily.  . metoprolol tartrate (LOPRESSOR) 50 MG tablet Take 1 tablet (50 mg total) by mouth 2 (two) times daily.  . sitaGLIPtin (JANUVIA) 100 MG tablet Take 1 tablet (100 mg total) by mouth daily.   No facility-administered encounter medications on file as of 09/30/2019.    Activities of Daily Living In your present state of health, do you have any difficulty performing the following activities: 09/30/2019  Hearing? N  Comment declines hearing aids  Vision? Y  Difficulty concentrating or making decisions? Y  Walking or climbing stairs? Y  Dressing or bathing? Y  Doing errands, shopping? Y  Preparing Food and eating ? N  Using the Toilet? N  In the past six months, have you accidently leaked urine? N  Do you have problems with loss of bowel control? N  Managing your Medications? Y  Managing your Finances? Y  Housekeeping or managing your Housekeeping? N  Some recent data might be hidden    Patient Care Team: Juline Patch, MD as PCP - General (Family Medicine)   Assessment:   This is a routine wellness examination for Jose Schroeder.  Exercise Activities and Dietary recommendations Current Exercise Habits: The patient does not participate in regular exercise at present, Exercise limited by: orthopedic condition(s)  Goals    . Blood Pressure < 140/90     Pt to monitor salt intake and maintain blood pressure    . Patient Stated     Maintain diabetes and reduce amount of late night eating        Fall Risk Fall Risk  09/30/2019 09/24/2018 09/13/2018 05/14/2018 12/15/2016  Falls in the past year? 0 0 0 1 No  Number falls in past yr: 0 0 - 0 -  Injury with Fall? 0 0 - 0 -  Risk for fall due to : Impaired balance/gait Impaired balance/gait;Impaired mobility - Impaired balance/gait -  Follow up Falls prevention discussed Falls prevention discussed Falls evaluation completed Falls evaluation completed -   FALL RISK PREVENTION PERTAINING TO THE HOME:  Any  stairs in or around the home? No  If so, are there any without handrails? No   Home free of loose throw rugs in walkways, pet beds, electrical cords, etc? Yes  Adequate lighting in your home to reduce risk of falls? Yes   ASSISTIVE DEVICES UTILIZED TO PREVENT FALLS:  Life alert? No  Use of a cane, walker or w/c? Yes  Grab bars in the bathroom? No  Shower chair or bench in shower? Yes  Elevated toilet seat or a handicapped toilet? Yes   DME ORDERS:  DME order needed?  No   TIMED UP AND GO:  Was the test performed? Yes .  Length of time to ambulate 10 feet: 8 sec.   GAIT:  Appearance of gait:  Gait slow and steady  with the use of an assistive device.   Education: Fall risk prevention has been discussed.  Intervention(s) required? No    DME/home health order needed?  No    Depression Screen PHQ 2/9 Scores 09/30/2019 01/14/2019 09/24/2018 09/13/2018  PHQ - 2 Score 0 0 0 0  PHQ- 9 Score - 0 - 0    Cognitive Function     6CIT Screen 09/30/2019 09/24/2018  What Year? 4 points 0 points  What month? 3 points 3 points  What time? 0 points 0 points  Count back from 20 0 points 0 points  Months in reverse 4 points 4 points  Repeat phrase 8 points 6 points  Total Score 19 13    Immunization History  Administered Date(s) Administered  . Fluad Quad(high Dose 65+) 01/14/2019  . Influenza,inj,Quad PF,6+ Mos 12/15/2016  . Pneumococcal Conjugate-13 09/24/2015  . Pneumococcal Polysaccharide-23 11/22/2013  . Tdap 02/20/2015    Qualifies for Shingles Vaccine? Yes  . Due for Shingrix. Education has been provided regarding the importance of this vaccine. Pt has been advised to call insurance company to determine out of pocket expense. Advised may also receive vaccine at local pharmacy or Health Dept. Verbalized acceptance and understanding.  Tdap: Up to date  Flu Vaccine: Up to date  Pneumococcal Vaccine: Up to date  Covid-19 Vaccine: Up to date - need vaccine record info; pt to  bring to next appt.  Screening Tests Health Maintenance  Topic Date Due  . COVID-19 Vaccine (1) Never done  . FOOT EXAM  12/19/2018  . HEMOGLOBIN A1C  11/14/2019  . INFLUENZA VACCINE  11/24/2019  . OPHTHALMOLOGY EXAM  08/14/2020  . TETANUS/TDAP  02/19/2025  . PNA vac Low Risk Adult  Completed   Cancer Screenings:  Colorectal Screening: No longer required.   Lung Cancer Screening: (Low Dose CT Chest recommended if Age 64-80 years, 30 pack-year currently smoking OR have quit w/in 15years.) does not qualify.   Additional Screening:  Hepatitis C Screening: no longer required  Vision Screening: Recommended annual ophthalmology exams for early detection of glaucoma and other disorders of the eye. Is the patient up to date with their annual eye exam?  Yes  Who is the provider or what is the name of the office in which the pt attends annual eye exams? [facility]  Dental Screening: Recommended annual dental exams for proper oral hygiene  Community Resource Referral:  CRR required this visit?  No       Plan:    I have personally reviewed and addressed the Medicare Annual Wellness questionnaire and have noted the following in the patient's chart:  A. Medical and social history B. Use of alcohol, tobacco or illicit drugs  C. Current medications and supplements D. Functional ability and status E.  Nutritional status F.  Physical activity G. Advance directives H. List of other physicians I.  Hospitalizations, surgeries, and ER visits in previous 12 months J.  Vitals K. Screenings such as hearing and vision if needed, cognitive and depression L. Referrals and appointments   In addition, I have reviewed and discussed with patient certain preventive protocols, quality metrics, and best practice recommendations. A written personalized care plan for preventive services as well as general preventive health recommendations were provided to patient.   Signed,  Reather Littler,  LPN Nurse Health Advisor   Nurse Notes: pt accompanied to visit today by daughter Karoline Caldwell. Pt c/o pain and soreness in right leg at amputation and plans to see Dr. Ether Griffins for prosthetic fitting and possible new sleeve.   BP elevated during visit. 170/100 at start of visit and 170/90 at end of visit. Per Dr. Yetta Barre have patient make sure he takes his meds tomorrow prior to appt and bring all medication and blood pressure log to appt. Pt aware and denies headaches, blurred vision or chest pain.   Pt's daughter states family concern about cognitive impairment or possible dementia with c/o patient being forgetful. 6CIT score of 19 today. Plans to discuss at visit with Dr. Yetta Barre tomorrow.

## 2019-09-30 NOTE — Patient Instructions (Signed)
Jose Schroeder , Thank you for taking time to come for your Medicare Wellness Visit. I appreciate your ongoing commitment to your health goals. Please review the following plan we discussed and let me know if I can assist you in the future.   Screening recommendations/referrals: Colonoscopy: no longer required Recommended yearly ophthalmology/optometry visit for glaucoma screening and checkup Recommended yearly dental visit for hygiene and checkup  Vaccinations: Influenza vaccine: done 01/14/19 Pneumococcal vaccine: done 09/24/15 Tdap vaccine: done 02/20/15 Shingles vaccine: Shingrix discussed. Please contact your pharmacy for coverage information.  Covid-19: Please bring your vaccination record to your next appt.   Advanced directives: Advance directive discussed with you today. Even though you declined this today please call our office should you change your mind and we can give you the proper paperwork for you to fill out.  Conditions/risks identified: Recommend reducing salt in diet to help control blood pressure.   Next appointment: Follow up in one year for your annual wellness visit.   Preventive Care 17 Years and Older, Male Preventive care refers to lifestyle choices and visits with your health care provider that can promote health and wellness. What does preventive care include?  A yearly physical exam. This is also called an annual well check.  Dental exams once or twice a year.  Routine eye exams. Ask your health care provider how often you should have your eyes checked.  Personal lifestyle choices, including:  Daily care of your teeth and gums.  Regular physical activity.  Eating a healthy diet.  Avoiding tobacco and drug use.  Limiting alcohol use.  Practicing safe sex.  Taking low doses of aspirin every day.  Taking vitamin and mineral supplements as recommended by your health care provider. What happens during an annual well check? The services and  screenings done by your health care provider during your annual well check will depend on your age, overall health, lifestyle risk factors, and family history of disease. Counseling  Your health care provider may ask you questions about your:  Alcohol use.  Tobacco use.  Drug use.  Emotional well-being.  Home and relationship well-being.  Sexual activity.  Eating habits.  History of falls.  Memory and ability to understand (cognition).  Work and work Astronomer. Screening  You may have the following tests or measurements:  Height, weight, and BMI.  Blood pressure.  Lipid and cholesterol levels. These may be checked every 5 years, or more frequently if you are over 56 years old.  Skin check.  Lung cancer screening. You may have this screening every year starting at age 66 if you have a 30-pack-year history of smoking and currently smoke or have quit within the past 15 years.  Fecal occult blood test (FOBT) of the stool. You may have this test every year starting at age 55.  Flexible sigmoidoscopy or colonoscopy. You may have a sigmoidoscopy every 5 years or a colonoscopy every 10 years starting at age 30.  Prostate cancer screening. Recommendations will vary depending on your family history and other risks.  Hepatitis C blood test.  Hepatitis B blood test.  Sexually transmitted disease (STD) testing.  Diabetes screening. This is done by checking your blood sugar (glucose) after you have not eaten for a while (fasting). You may have this done every 1-3 years.  Abdominal aortic aneurysm (AAA) screening. You may need this if you are a current or former smoker.  Osteoporosis. You may be screened starting at age 43 if you are at high risk.  Talk with your health care provider about your test results, treatment options, and if necessary, the need for more tests. Vaccines  Your health care provider may recommend certain vaccines, such as:  Influenza vaccine. This is  recommended every year.  Tetanus, diphtheria, and acellular pertussis (Tdap, Td) vaccine. You may need a Td booster every 10 years.  Zoster vaccine. You may need this after age 44.  Pneumococcal 13-valent conjugate (PCV13) vaccine. One dose is recommended after age 60.  Pneumococcal polysaccharide (PPSV23) vaccine. One dose is recommended after age 4. Talk to your health care provider about which screenings and vaccines you need and how often you need them. This information is not intended to replace advice given to you by your health care provider. Make sure you discuss any questions you have with your health care provider. Document Released: 05/08/2015 Document Revised: 12/30/2015 Document Reviewed: 02/10/2015 Elsevier Interactive Patient Education  2017 [employer] Prevention in the Home Falls can cause injuries. They can happen to people of all ages. There are many things you can do to make your home safe and to help prevent falls. What can I do on the outside of my home?  Regularly fix the edges of walkways and driveways and fix any cracks.  Remove anything that might make you trip as you walk through a door, such as a raised step or threshold.  Trim any bushes or trees on the path to your home.  Use bright outdoor lighting.  Clear any walking paths of anything that might make someone trip, such as rocks or tools.  Regularly check to see if handrails are loose or broken. Make sure that both sides of any steps have handrails.  Any raised decks and porches should have guardrails on the edges.  Have any leaves, snow, or ice cleared regularly.  Use sand or salt on walking paths during winter.  Clean up any spills in your garage right away. This includes oil or grease spills. What can I do in the bathroom?  Use night lights.  Install grab bars by the toilet and in the tub and shower. Do not use towel bars as grab bars.  Use non-skid mats or decals in the tub or  shower.  If you need to sit down in the shower, use a plastic, non-slip stool.  Keep the floor dry. Clean up any water that spills on the floor as soon as it happens.  Remove soap buildup in the tub or shower regularly.  Attach bath mats securely with double-sided non-slip rug tape.  Do not have throw rugs and other things on the floor that can make you trip. What can I do in the bedroom?  Use night lights.  Make sure that you have a light by your bed that is easy to reach.  Do not use any sheets or blankets that are too big for your bed. They should not hang down onto the floor.  Have a firm chair that has side arms. You can use this for support while you get dressed.  Do not have throw rugs and other things on the floor that can make you trip. What can I do in the kitchen?  Clean up any spills right away.  Avoid walking on wet floors.  Keep items that you use a lot in easy-to-reach places.  If you need to reach something above you, use a strong step stool that has a grab bar.  Keep electrical cords out of the way.  Do not use floor polish or wax that makes floors slippery. If you must use wax, use non-skid floor wax.  Do not have throw rugs and other things on the floor that can make you trip. What can I do with my stairs?  Do not leave any items on the stairs.  Make sure that there are handrails on both sides of the stairs and use them. Fix handrails that are broken or loose. Make sure that handrails are as long as the stairways.  Check any carpeting to make sure that it is firmly attached to the stairs. Fix any carpet that is loose or worn.  Avoid having throw rugs at the top or bottom of the stairs. If you do have throw rugs, attach them to the floor with carpet tape.  Make sure that you have a light switch at the top of the stairs and the bottom of the stairs. If you do not have them, ask someone to add them for you. What else can I do to help prevent falls?   Wear shoes that:  Do not have high heels.  Have rubber bottoms.  Are comfortable and fit you well.  Are closed at the toe. Do not wear sandals.  If you use a stepladder:  Make sure that it is fully opened. Do not climb a closed stepladder.  Make sure that both sides of the stepladder are locked into place.  Ask someone to hold it for you, if possible.  Clearly mark and make sure that you can see:  Any grab bars or handrails.  First and last steps.  Where the edge of each step is.  Use tools that help you move around (mobility aids) if they are needed. These include:  Canes.  Walkers.  Scooters.  Crutches.  Turn on the lights when you go into a dark area. Replace any light bulbs as soon as they burn out.  Set up your furniture so you have a clear path. Avoid moving your furniture around.  If any of your floors are uneven, fix them.  If there are any pets around you, be aware of where they are.  Review your medicines with your doctor. Some medicines can make you feel dizzy. This can increase your chance of falling. Ask your doctor what other things that you can do to help prevent falls. This information is not intended to replace advice given to you by your health care provider. Make sure you discuss any questions you have with your health care provider. Document Released: 02/05/2009 Document Revised: 09/17/2015 Document Reviewed: 05/16/2014 Elsevier Interactive Patient Education  2017 [employer].

## 2019-10-01 ENCOUNTER — Encounter: Payer: Self-pay | Admitting: Family Medicine

## 2019-10-01 ENCOUNTER — Ambulatory Visit (INDEPENDENT_AMBULATORY_CARE_PROVIDER_SITE_OTHER): Payer: Medicare Other | Admitting: Family Medicine

## 2019-10-01 VITALS — BP 137/80 | HR 68 | Ht 74.0 in | Wt 161.0 lb

## 2019-10-01 DIAGNOSIS — D508 Other iron deficiency anemias: Secondary | ICD-10-CM

## 2019-10-01 DIAGNOSIS — E7801 Familial hypercholesterolemia: Secondary | ICD-10-CM

## 2019-10-01 DIAGNOSIS — I10 Essential (primary) hypertension: Secondary | ICD-10-CM

## 2019-10-01 DIAGNOSIS — I739 Peripheral vascular disease, unspecified: Secondary | ICD-10-CM

## 2019-10-01 DIAGNOSIS — D638 Anemia in other chronic diseases classified elsewhere: Secondary | ICD-10-CM | POA: Insufficient documentation

## 2019-10-01 DIAGNOSIS — S88111A Complete traumatic amputation at level between knee and ankle, right lower leg, initial encounter: Secondary | ICD-10-CM

## 2019-10-01 DIAGNOSIS — D649 Anemia, unspecified: Secondary | ICD-10-CM | POA: Insufficient documentation

## 2019-10-01 DIAGNOSIS — E1159 Type 2 diabetes mellitus with other circulatory complications: Secondary | ICD-10-CM

## 2019-10-01 DIAGNOSIS — E78019 Familial hypercholesterolemia, unspecified: Secondary | ICD-10-CM | POA: Insufficient documentation

## 2019-10-01 MED ORDER — FERROUS SULFATE 325 (65 FE) MG PO TABS: 325.0000 mg | ORAL_TABLET | Freq: Every day | ORAL | 1 refills | Status: DC

## 2019-10-01 MED ORDER — LISINOPRIL 5 MG PO TABS: 5.0000 mg | ORAL_TABLET | Freq: Every day | ORAL | 1 refills | Status: DC

## 2019-10-01 MED ORDER — AMLODIPINE BESYLATE 10 MG PO TABS: 10.0000 mg | ORAL_TABLET | Freq: Every day | ORAL | 1 refills | Status: DC

## 2019-10-01 MED ORDER — METOPROLOL TARTRATE 50 MG PO TABS: 50.0000 mg | ORAL_TABLET | Freq: Two times a day (BID) | ORAL | 1 refills | Status: DC

## 2019-10-01 MED ORDER — GLIPIZIDE ER 10 MG PO TB24: 10.0000 mg | ORAL_TABLET | Freq: Every day | ORAL | 1 refills | Status: DC

## 2019-10-01 MED ORDER — METFORMIN HCL 500 MG PO TABS: 500.0000 mg | ORAL_TABLET | Freq: Two times a day (BID) | ORAL | 1 refills | Status: DC

## 2019-10-01 MED ORDER — SITAGLIPTIN PHOSPHATE 100 MG PO TABS: 100.0000 mg | ORAL_TABLET | Freq: Every day | ORAL | 1 refills | Status: DC

## 2019-10-01 MED ORDER — GLUCOSE BLOOD VI STRP: ORAL_STRIP | 1 refills | Status: DC

## 2019-10-01 MED ORDER — HYDROCHLOROTHIAZIDE 12.5 MG PO TABS: 12.5000 mg | ORAL_TABLET | Freq: Every day | ORAL | 1 refills | Status: DC

## 2019-10-01 NOTE — Progress Notes (Signed)
Date:  10/01/2019   Name:  Jose Schroeder   DOB:  [birth year]   MRN:  [id number]   Chief Complaint: Anemia, Diabetes, and Hypertension  Anemia Presents for follow-up visit. There has been no abdominal pain, anorexia, bruising/bleeding easily, confusion, fever, leg swelling, light-headedness, malaise/fatigue, pallor, palpitations, paresthesias, pica or weight loss. Signs of blood loss that are not present include hematemesis, hematochezia and melena. There is no history of chronic renal disease or heart failure. There are no compliance problems.   Diabetes He presents for his follow-up diabetic visit. He has type 2 diabetes mellitus. His disease course has been stable. There are no hypoglycemic associated symptoms. Pertinent negatives for hypoglycemia include no confusion, dizziness, headaches, nervousness/anxiousness or pallor. There are no diabetic associated symptoms. Pertinent negatives for diabetes include no chest pain, no polydipsia and no weight loss. There are no hypoglycemic complications. Symptoms are stable. There are no diabetic complications. Pertinent negatives for diabetic complications include no CVA, PVD or retinopathy. There are no known risk factors for coronary artery disease. Current diabetic treatment includes oral agent (triple therapy). An ACE inhibitor/angiotensin II receptor blocker is being taken.  Hypertension The current episode started more than 1 year ago. The problem has been gradually improving since onset. The problem is controlled. Pertinent negatives include no chest pain, headaches, malaise/fatigue, neck pain, orthopnea, palpitations or shortness of breath. Past treatments include ACE inhibitors, calcium channel blockers, beta blockers and diuretics. The current treatment provides moderate improvement. There is no history of angina, kidney disease, CAD/MI, CVA, heart failure, left ventricular hypertrophy, PVD or retinopathy. There is no history of chronic renal  disease, a hypertension causing med or renovascular disease.  Hyperlipidemia This is a chronic problem. The current episode started more than 1 year ago. The problem is controlled. Recent lipid tests were reviewed and are normal. He has no history of chronic renal disease. Pertinent negatives include no chest pain, myalgias or shortness of breath. Current antihyperlipidemic treatment includes diet change. The current treatment provides moderate improvement of lipids.    Lab Results  Component Value Date   CREATININE 1.02 05/17/2019   BUN 13 05/17/2019   NA 141 05/17/2019   K 4.6 05/17/2019   CL 101 05/17/2019   CO2 25 05/17/2019   Lab Results  Component Value Date   CHOL 197 05/14/2018   HDL 59 05/14/2018   LDLCALC 120 (H) 05/14/2018   TRIG 92 05/14/2018   CHOLHDL 3.0 12/15/2016   No results found for: TSH Lab Results  Component Value Date   HGBA1C 6.4 (H) 05/17/2019   Lab Results  Component Value Date   WBC 6.2 05/17/2019   HGB 11.2 (L) 05/17/2019   HCT 33.2 (L) 05/17/2019   MCV 87 05/17/2019   PLT 260 05/17/2019   Lab Results  Component Value Date   ALT 7 05/17/2019   AST 17 05/17/2019   ALKPHOS 53 05/17/2019   BILITOT 0.3 05/17/2019     Review of Systems  Constitutional: Negative for chills, fever, malaise/fatigue and weight loss.  HENT: Negative for drooling, ear discharge, ear pain and sore throat.   Respiratory: Negative for cough, shortness of breath and wheezing.   Cardiovascular: Negative for chest pain, palpitations, orthopnea and leg swelling.  Gastrointestinal: Negative for abdominal pain, anorexia, blood in stool, constipation, diarrhea, hematemesis, hematochezia, melena and nausea.  Endocrine: Negative for polydipsia.  Genitourinary: Negative for dysuria, frequency, hematuria and urgency.  Musculoskeletal: Negative for back pain, myalgias and neck pain.  Skin:  Negative for pallor and rash.  Allergic/Immunologic: Negative for environmental allergies.   Neurological: Negative for dizziness, light-headedness, headaches and paresthesias.  Hematological: Does not bruise/bleed easily.  Psychiatric/Behavioral: Negative for confusion and suicidal ideas. The patient is not nervous/anxious.     Patient Active Problem List   Diagnosis Date Noted  . 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 Amputated below knee, right (HCC) 05/09/2017  . PVD (peripheral vascular disease) (HCC) 12/15/2016  . 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 Ulcer of right foot (HCC) 01/31/2016  . Amputated great toe (HCC) 01/11/2016  . Toe infection 01/04/2016  . Type 2 diabetes mellitus without complication, without long-term current use of insulin (HCC) 09/24/2015  . Hypertension associated with diabetes (HCC) 09/24/2015    No Known Allergies  Past Surgical History:  Procedure Laterality Date  . AMPUTATION TOE Right 01/15/2016   Procedure: AMPUTATION TOE;  Surgeon: Gwyneth Revels, DPM;  Location: [facility] ORS;  Service: Podiatry;  Laterality: Right;  . PERIPHERAL VASCULAR CATHETERIZATION Right 01/07/2016   Procedure: Abdominal Aortogram w/Lower Extremity;  Surgeon: Renford Dills, MD;  Location: [facility];  Service: Cardiovascular;  Laterality: Right;  . TOE SURGERY     amputated 2 toes on L) foot    Social History   Tobacco Use  . Smoking status: Never Smoker  . Smokeless tobacco: Never Used  Substance Use Topics  . Alcohol use: No    Alcohol/week: 0.0 standard drinks  . Drug use: No     Medication list has been reviewed and updated.  Current Meds  Medication Sig  . amLODipine (NORVASC) 10 MG tablet Take 1 tablet (10 mg total) by mouth daily.  Marland Kitchen aspirin EC 81 MG tablet Take 1 tablet (81 mg total) by mouth daily.  . ferrous sulfate 325 (65 FE) MG tablet Take 1 tablet (325 mg total) by mouth daily with breakfast.  . glipiZIDE (GLUCOTROL XL) 10 MG 24 hr tablet Take 1 tablet (10 mg total) by mouth daily with breakfast.  . glucose  blood (ONE TOUCH ULTRA TEST) test strip USE AS DIRECTED BY PHYSICIAN  . hydrochlorothiazide (HYDRODIURIL) 12.5 MG tablet Take 1 tablet (12.5 mg total) by mouth daily.  Marland Kitchen lisinopril (ZESTRIL) 5 MG tablet Take 1 tablet (5 mg total) by mouth daily.  . metFORMIN (GLUCOPHAGE) 500 MG tablet Take 1 tablet (500 mg total) by mouth 2 (two) times daily.  . metoprolol tartrate (LOPRESSOR) 50 MG tablet Take 1 tablet (50 mg total) by mouth 2 (two) times daily.  . sitaGLIPtin (JANUVIA) 100 MG tablet Take 1 tablet (100 mg total) by mouth daily.    PHQ 2/9 Scores 10/01/2019 09/30/2019 01/14/2019 09/24/2018  PHQ - 2 Score 0 0 0 0  PHQ- 9 Score 0 - 0 -    BP Readings from Last 3 Encounters:  10/01/19 140/80  09/30/19 (!) 170/90  05/17/19 130/80    Physical Exam HENT:     Head: Normocephalic.     Right Ear: Tympanic membrane, ear canal and external ear normal.     Left Ear: Tympanic membrane, ear canal and external ear normal.     Nose: Nose normal.  Eyes:     General: No scleral icterus.       Right eye: No discharge.        Left eye: No discharge.     Conjunctiva/sclera: Conjunctivae normal.     Pupils: Pupils are equal, round, and reactive to light.  Neck:  Thyroid: No thyromegaly.     Vascular: No JVD.     Trachea: No tracheal deviation.  Cardiovascular:     Rate and Rhythm: Normal rate and regular rhythm.     Heart sounds: Normal heart sounds. No murmur. No friction rub. No gallop.   Pulmonary:     Effort: No respiratory distress.     Breath sounds: Normal breath sounds. No wheezing or rales.  Abdominal:     General: Bowel sounds are normal.     Palpations: Abdomen is soft. There is no mass.     Tenderness: There is no abdominal tenderness. There is no guarding or rebound.  Musculoskeletal:        General: No tenderness. Normal range of motion.     Cervical back: Normal range of motion and neck supple.  Lymphadenopathy:     Cervical: No cervical adenopathy.  Skin:    General: Skin is  warm.     Findings: No rash.  Neurological:     Mental Status: He is alert and oriented to person, place, and time.     Cranial Nerves: No cranial nerve deficit.     Deep Tendon Reflexes: Reflexes are normal and symmetric.     Wt Readings from Last 3 Encounters:  10/01/19 161 lb (73 kg)  09/30/19 161 lb 6.4 oz (73.2 kg)  05/17/19 160 lb (72.6 kg)    BP 140/80   Pulse 68   Ht 6\' 2"  (1.88 m)   Wt 161 lb (73 kg)   BMI 20.67 kg/m   Assessment and Plan: 1. Type 2 diabetes mellitus with other circulatory complication, without long-term current use of insulin (HCC) Chronic.  Controlled.  Stable.  Continue Glucotrol XL 10 mg once a day Metformin 500 mg twice a day Januvia 100 mg once a day.  Will check A1c and renal function panel to assess current diabetic control. - glipiZIDE (GLUCOTROL XL) 10 MG 24 hr tablet; Take 1 tablet (10 mg total) by mouth daily with breakfast.  Dispense: 90 tablet; Refill: 1 - glucose blood (ONE TOUCH ULTRA TEST) test strip; USE AS DIRECTED BY PHYSICIAN  Dispense: 100 each; Refill: 1 - metFORMIN (GLUCOPHAGE) 500 MG tablet; Take 1 tablet (500 mg total) by mouth 2 (two) times daily.  Dispense: 180 tablet; Refill: 1 - sitaGLIPtin (JANUVIA) 100 MG tablet; Take 1 tablet (100 mg total) by mouth daily.  Dispense: 90 tablet; Refill: 1 - Hemoglobin A1c - Renal Function Panel  2. Essential hypertension Chronic.  Controlled.  Stable.  Continue amlodipine 10 mg once a day hydrochlorothiazide 12.5 mg once a day lisinopril 5 mg once a day and metoprolol 50 mg 1 twice a day will check renal function panel for assessment of electrolytes and GFR. - amLODipine (NORVASC) 10 MG tablet; Take 1 tablet (10 mg total) by mouth daily.  Dispense: 90 tablet; Refill: 1 - hydrochlorothiazide (HYDRODIURIL) 12.5 MG tablet; Take 1 tablet (12.5 mg total) by mouth daily.  Dispense: 90 tablet; Refill: 1 - lisinopril (ZESTRIL) 5 MG tablet; Take 1 tablet (5 mg total) by mouth daily.  Dispense: 90  tablet; Refill: 1 - metoprolol tartrate (LOPRESSOR) 50 MG tablet; Take 1 tablet (50 mg total) by mouth 2 (two) times daily.  Dispense: 180 tablet; Refill: 1 - Renal Function Panel  3. Other iron deficiency anemia Chronic.  Stable.  Controlled.  Continue ferrous sulfate 325 mg once a day will check hemoglobin. - ferrous sulfate 325 (65 FE) MG tablet; Take 1 tablet (325 mg  total) by mouth daily with breakfast.  Dispense: 90 tablet; Refill: 1 - Hemoglobin  4. PVD (peripheral vascular disease) ([place]) Patient with a history of peripheral vascular disease followed by vascular and podiatry.  Patient has undergone amputation as noted below.  5. Familial hypercholesterolemia Chronic.  Controlled.  Stable.  There is been mild elevation of the LDL and we will check current lipid panel and the fact that he is fasting from this morning. - Lipid Panel With LDL/HDL Ratio  6. Amputated below knee, right (HCC) Chronic.  Stable.  Patient is doing well with right knee amputation below the knee.  There is some mild irritation with his prosthesis which he is going to be seen in the not too distant future for reevaluation and resetting.

## 2019-10-02 LAB — LIPID PANEL WITH LDL/HDL RATIO
Cholesterol, Total: 177 mg/dL (ref 100–199)
HDL: 52 mg/dL (ref >39)
LDL Chol Calc (NIH): 108 mg/dL — ABNORMAL HIGH (ref 0–99)
LDL/HDL Ratio: 2.1 ratio (ref 0.0–3.6)
Triglycerides: 92 mg/dL (ref 0–149)
VLDL Cholesterol Cal: 17 mg/dL (ref 5–40)

## 2019-10-02 LAB — RENAL FUNCTION PANEL
Albumin: 4.7 g/dL — ABNORMAL HIGH (ref 3.6–4.6)
BUN/Creatinine Ratio: 14 (ref 10–24)
BUN: 13 mg/dL (ref 8–27)
CO2: 24 mmol/L (ref 20–29)
Calcium: 10 mg/dL (ref 8.6–10.2)
Chloride: 101 mmol/L (ref 96–106)
Creatinine, Ser: 0.9 mg/dL (ref 0.76–1.27)
GFR calc Af Amer: 90 mL/min/{1.73_m2} (ref >59)
GFR calc non Af Amer: 78 mL/min/{1.73_m2} (ref >59)
Glucose: 180 mg/dL — ABNORMAL HIGH (ref 65–99)
Phosphorus: 3.4 mg/dL (ref 2.8–4.1)
Potassium: 4.4 mmol/L (ref 3.5–5.2)
Sodium: 140 mmol/L (ref 134–144)

## 2019-10-02 LAB — HEMOGLOBIN: Hemoglobin: 11.4 g/dL — ABNORMAL LOW (ref 13.0–17.7)

## 2019-10-02 LAB — HEMOGLOBIN A1C
Est. average glucose Bld gHb Est-mCnc: 151 mg/dL
Hgb A1c MFr Bld: 6.9 % — ABNORMAL HIGH (ref 4.8–5.6)

## 2019-10-07 DIAGNOSIS — Z89422 Acquired absence of other left toe(s): Secondary | ICD-10-CM | POA: Diagnosis not present

## 2019-10-07 DIAGNOSIS — M79672 Pain in left foot: Secondary | ICD-10-CM | POA: Diagnosis not present

## 2019-10-07 DIAGNOSIS — M722 Plantar fascial fibromatosis: Secondary | ICD-10-CM | POA: Diagnosis not present

## 2019-10-07 DIAGNOSIS — E1151 Type 2 diabetes mellitus with diabetic peripheral angiopathy without gangrene: Secondary | ICD-10-CM | POA: Diagnosis not present

## 2019-11-29 ENCOUNTER — Telehealth: Payer: Self-pay | Admitting: Family Medicine

## 2019-11-29 NOTE — Telephone Encounter (Signed)
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 [place], [place] [address]

## 2019-12-03 ENCOUNTER — Other Ambulatory Visit: Payer: Self-pay | Admitting: Family Medicine

## 2019-12-03 DIAGNOSIS — E1159 Type 2 diabetes mellitus with other circulatory complications: Secondary | ICD-10-CM

## 2019-12-03 NOTE — Telephone Encounter (Signed)
Requested Prescriptions  Pending Prescriptions Disp Refills  . ONETOUCH ULTRA test strip [employer] Med Name: T/S ONE TOUCH ULTRA BLUE] 200 strip 3    Sig: USE AS DIRECTED BY  PHYSICIAN     Endocrinology: Diabetes - Testing Supplies Passed - 12/03/2019  4:21 AM      Passed - Valid encounter within last 12 months    Recent Outpatient Visits          2 months ago Type 2 diabetes mellitus with other circulatory complication, without long-term current use of insulin (HCC)   [facility] Duanne Limerick, MD   6 months ago Type 2 diabetes mellitus with other circulatory complication, without long-term current use of insulin (HCC)   [facility] Duanne Limerick, MD   10 months ago Essential hypertension   [facility] Duanne Limerick, MD   1 year ago Essential hypertension   [facility] Duanne Limerick, MD   1 year ago Non-seasonal allergic rhinitis due to pollen   [facility] Duanne Limerick, MD      Future Appointments            In 1 month Duanne Limerick, MD [facility], [facility]

## 2019-12-05 ENCOUNTER — Other Ambulatory Visit: Payer: Self-pay

## 2019-12-05 ENCOUNTER — Ambulatory Visit (INDEPENDENT_AMBULATORY_CARE_PROVIDER_SITE_OTHER): Payer: Medicare Other | Admitting: Family Medicine

## 2019-12-05 ENCOUNTER — Encounter: Payer: Self-pay | Admitting: Family Medicine

## 2019-12-05 VITALS — BP 150/74 | HR 83 | Ht 74.0 in | Wt 156.0 lb

## 2019-12-05 DIAGNOSIS — S88111A Complete traumatic amputation at level between knee and ankle, right lower leg, initial encounter: Secondary | ICD-10-CM | POA: Diagnosis not present

## 2019-12-05 DIAGNOSIS — I739 Peripheral vascular disease, unspecified: Secondary | ICD-10-CM | POA: Diagnosis not present

## 2019-12-05 NOTE — Progress Notes (Signed)
Date:  12/05/2019   Name:  Jose Schroeder   DOB:  1932/09/19   MRN:  [id number]   Chief Complaint: face to face (Pt needs 2x silicone liners and x6 multipy prostectic socks.Supplies are needed for safe ambulation due to R. transtibial amputee)  Patient is a 86 year old male who presents for a face to face exam for prosthetic supplies . The patient reports the following problems:none. Health maintenance has been reviewed upto date.   Lab Results  Component Value Date   CREATININE 0.90 10/01/2019   BUN 13 10/01/2019   NA 140 10/01/2019   K 4.4 10/01/2019   CL 101 10/01/2019   CO2 24 10/01/2019   Lab Results  Component Value Date   CHOL 177 10/01/2019   HDL 52 10/01/2019   LDLCALC 108 (H) 10/01/2019   TRIG 92 10/01/2019   CHOLHDL 3.0 12/15/2016   No results found for: TSH Lab Results  Component Value Date   HGBA1C 6.9 (H) 10/01/2019   Lab Results  Component Value Date   WBC 6.2 05/17/2019   HGB 11.4 (L) 10/01/2019   HCT 33.2 (L) 05/17/2019   MCV 87 05/17/2019   PLT 260 05/17/2019   Lab Results  Component Value Date   ALT 7 05/17/2019   AST 17 05/17/2019   ALKPHOS 53 05/17/2019   BILITOT 0.3 05/17/2019     Review of Systems  Constitutional: Negative for chills and fever.  HENT: Negative for drooling, ear discharge, ear pain and sore throat.   Respiratory: Negative for cough, shortness of breath and wheezing.   Cardiovascular: Negative for chest pain, palpitations and leg swelling.  Gastrointestinal: Negative for abdominal pain, blood in stool, constipation, diarrhea and nausea.  Endocrine: Negative for polydipsia.  Genitourinary: Negative for dysuria, frequency, hematuria and urgency.  Musculoskeletal: Negative for back pain, myalgias and neck pain.  Skin: Negative for rash.  Allergic/Immunologic: Negative for environmental allergies.  Neurological: Negative for dizziness and headaches.  Hematological: Does not bruise/bleed easily.    Psychiatric/Behavioral: Negative for suicidal ideas. The patient is not nervous/anxious.     Patient Active Problem List   Diagnosis Date Noted  . Absolute anemia 10/01/2019  . Familial hypercholesterolemia 10/01/2019  . 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 Amputated below knee, right (HCC) 05/09/2017  . PVD (peripheral vascular disease) (HCC) 12/15/2016  . 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 Ulcer of right foot (HCC) 01/31/2016  . Amputated great toe (HCC) 01/11/2016  . Toe infection 01/04/2016  . Diabetes mellitus (HCC) 09/24/2015  . Essential hypertension 09/24/2015    No Known Allergies  Past Surgical History:  Procedure Laterality Date  . AMPUTATION TOE Right 01/15/2016   Procedure: AMPUTATION TOE;  Surgeon: Gwyneth Revels, DPM;  Location: [facility] ORS;  Service: Podiatry;  Laterality: Right;  . PERIPHERAL VASCULAR CATHETERIZATION Right 01/07/2016   Procedure: Abdominal Aortogram w/Lower Extremity;  Surgeon: Renford Dills, MD;  Location: [facility];  Service: Cardiovascular;  Laterality: Right;  . TOE SURGERY     amputated 2 toes on L) foot    Social History   Tobacco Use  . Smoking status: Never Smoker  . Smokeless tobacco: Never Used  Vaping Use  . Vaping Use: Never used  Substance Use Topics  . Alcohol use: No    Alcohol/week: 0.0 standard drinks  . Drug use: No     Medication list has been reviewed and updated.  Current Meds  Medication  Sig  . amLODipine (NORVASC) 10 MG tablet Take 1 tablet (10 mg total) by mouth daily.  Marland Kitchen aspirin EC 81 MG tablet Take 1 tablet (81 mg total) by mouth daily.  . ferrous sulfate 325 (65 FE) MG tablet Take 1 tablet (325 mg total) by mouth daily with breakfast.  . glipiZIDE (GLUCOTROL XL) 10 MG 24 hr tablet Take 1 tablet (10 mg total) by mouth daily with breakfast.  . hydrochlorothiazide (HYDRODIURIL) 12.5 MG tablet Take 1 tablet (12.5 mg total) by mouth daily.  Marland Kitchen lisinopril (ZESTRIL) 5 MG  tablet Take 1 tablet (5 mg total) by mouth daily.  . metFORMIN (GLUCOPHAGE) 500 MG tablet Take 1 tablet (500 mg total) by mouth 2 (two) times daily.  . metoprolol tartrate (LOPRESSOR) 50 MG tablet Take 1 tablet (50 mg total) by mouth 2 (two) times daily.  Letta Pate ULTRA test strip USE AS DIRECTED BY  PHYSICIAN  . sitaGLIPtin (JANUVIA) 100 MG tablet Take 1 tablet (100 mg total) by mouth daily.    PHQ 2/9 Scores 12/05/2019 10/01/2019 09/30/2019 01/14/2019  PHQ - 2 Score 0 0 0 0  PHQ- 9 Score 0 0 - 0    GAD 7 : Generalized Anxiety Score 12/05/2019 10/01/2019  Nervous, Anxious, on Edge 0 0  Control/stop worrying 0 0  Worry too much - different things 0 0  Trouble relaxing 0 0  Restless 0 0  Easily annoyed or irritable 0 0  Afraid - awful might happen 0 0  Total GAD 7 Score 0 0  Anxiety Difficulty Not difficult at all -    BP Readings from Last 3 Encounters:  12/05/19 (!) 150/74  10/01/19 137/80  09/30/19 (!) 170/90    Physical Exam Vitals and nursing note reviewed.  HENT:     Head: Normocephalic.     Right Ear: Tympanic membrane, ear canal and external ear normal.     Left Ear: Tympanic membrane, ear canal and external ear normal.     Nose: Nose normal.  Eyes:     General: No scleral icterus.       Right eye: No discharge.        Left eye: No discharge.     Conjunctiva/sclera: Conjunctivae normal.     Pupils: Pupils are equal, round, and reactive to light.  Neck:     Thyroid: No thyromegaly.     Vascular: No JVD.     Trachea: No tracheal deviation.  Cardiovascular:     Rate and Rhythm: Normal rate and regular rhythm.     Heart sounds: Normal heart sounds. No murmur heard.  No friction rub. No gallop.   Pulmonary:     Effort: No respiratory distress.     Breath sounds: Normal breath sounds. No wheezing, rhonchi or rales.  Abdominal:     General: Bowel sounds are normal.     Palpations: Abdomen is soft. There is no mass.     Tenderness: There is no abdominal tenderness.  There is no guarding or rebound.  Musculoskeletal:        General: No tenderness. Normal range of motion.     Cervical back: Normal range of motion and neck supple.  Lymphadenopathy:     Cervical: No cervical adenopathy.  Skin:    General: Skin is warm.     Findings: No rash.  Neurological:     Mental Status: He is alert and oriented to person, place, and time.     Cranial Nerves: No cranial nerve deficit.  Deep Tendon Reflexes: Reflexes are normal and symmetric.     Wt Readings from Last 3 Encounters:  12/05/19 156 lb (70.8 kg)  10/01/19 161 lb (73 kg)  09/30/19 161 lb 6.4 oz (73.2 kg)    BP (!) 150/74   Pulse 83   Ht 6\' 2"  (1.88 m)   Wt 156 lb (70.8 kg)   SpO2 98%   BMI 20.03 kg/m   Assessment and Plan: 1. Amputated below knee, right [facility]) Patient is seen for follow-up to secure supplies for amputation below the knee of the right extremity.  Patient needs (2) silicone liners and (6) multiply prosthetic socks.  Supplies are needed for the safe ambulation due to his right transtibial amputation.  2. PVD (peripheral vascular disease) (HCC) Discussion of patient's left leg is that he is doing well without concerns.  There is no ulcerations and no significant pain or involvement of the remaining limb.  Patient has been advised to continue to be aware of any signs of problems.  We will  podiatry care to continue  surveillance of remaining foot.

## 2019-12-05 NOTE — Telephone Encounter (Signed)
Spoke to [place]- she received 13 pages and will let us know if she needs anything else

## 2020-01-31 ENCOUNTER — Ambulatory Visit (INDEPENDENT_AMBULATORY_CARE_PROVIDER_SITE_OTHER): Payer: Medicare Other | Admitting: Family Medicine

## 2020-01-31 ENCOUNTER — Encounter: Payer: Self-pay | Admitting: Family Medicine

## 2020-01-31 ENCOUNTER — Other Ambulatory Visit: Payer: Self-pay

## 2020-01-31 VITALS — BP 124/82 | HR 68 | Ht 74.0 in | Wt 157.0 lb

## 2020-01-31 DIAGNOSIS — E7801 Familial hypercholesterolemia: Secondary | ICD-10-CM | POA: Diagnosis not present

## 2020-01-31 DIAGNOSIS — S88111A Complete traumatic amputation at level between knee and ankle, right lower leg, initial encounter: Secondary | ICD-10-CM | POA: Diagnosis not present

## 2020-01-31 DIAGNOSIS — Z23 Encounter for immunization: Secondary | ICD-10-CM

## 2020-01-31 DIAGNOSIS — E1159 Type 2 diabetes mellitus with other circulatory complications: Secondary | ICD-10-CM

## 2020-01-31 MED ORDER — ATORVASTATIN CALCIUM 10 MG PO TABS: 10.0000 mg | ORAL_TABLET | Freq: Every day | ORAL | 3 refills | Status: DC

## 2020-01-31 NOTE — Progress Notes (Signed)
Date:  01/31/2020   Name:  Jose Schroeder   DOB:  [birth year]   MRN:  [id number]   Chief Complaint: Diabetes (follow up) and Flu Vaccine  Diabetes He presents for his follow-up diabetic visit. He has type 2 diabetes mellitus. His disease course has been stable. There are no hypoglycemic associated symptoms. There are no diabetic associated symptoms. Pertinent negatives for diabetes include no blurred vision, no chest pain, no fatigue, no foot paresthesias, no foot ulcerations, no polydipsia, no polyphagia, no polyuria, no visual change, no weakness and no weight loss. There are no hypoglycemic complications. Symptoms are stable. There are no diabetic complications. Risk factors for coronary artery disease include diabetes mellitus, dyslipidemia and male sex. Current diabetic treatment includes oral agent (triple therapy) (glipizide/metformen/januvia). He is compliant with treatment some of the time. His weight is stable. He is following a generally healthy diet. Meal planning includes avoidance of concentrated sweets and carbohydrate counting. His home blood glucose trend is fluctuating minimally. An ACE inhibitor/angiotensin II receptor blocker is being taken.    Lab Results  Component Value Date   CREATININE 0.90 10/01/2019   BUN 13 10/01/2019   NA 140 10/01/2019   K 4.4 10/01/2019   CL 101 10/01/2019   CO2 24 10/01/2019   Lab Results  Component Value Date   CHOL 177 10/01/2019   HDL 52 10/01/2019   LDLCALC 108 (H) 10/01/2019   TRIG 92 10/01/2019   CHOLHDL 3.0 12/15/2016   No results found for: TSH Lab Results  Component Value Date   HGBA1C 6.9 (H) 10/01/2019   Lab Results  Component Value Date   WBC 6.2 05/17/2019   HGB 11.4 (L) 10/01/2019   HCT 33.2 (L) 05/17/2019   MCV 87 05/17/2019   PLT 260 05/17/2019   Lab Results  Component Value Date   ALT 7 05/17/2019   AST 17 05/17/2019   ALKPHOS 53 05/17/2019   BILITOT 0.3 05/17/2019     Review of Systems    Constitutional: Negative for fatigue and weight loss.  Eyes: Negative for blurred vision.  Cardiovascular: Negative for chest pain.  Endocrine: Negative for polydipsia, polyphagia and polyuria.  Neurological: Negative for weakness.    Patient Active Problem List   Diagnosis Date Noted  . Absolute anemia 10/01/2019  . Familial hypercholesterolemia 10/01/2019  . 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 Amputated below knee, right (HCC) 05/09/2017  . PVD (peripheral vascular disease) (HCC) 12/15/2016  . 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 Ulcer of right foot (HCC) 01/31/2016  . Amputated great toe (HCC) 01/11/2016  . Toe infection 01/04/2016  . Diabetes mellitus (HCC) 09/24/2015  . Essential hypertension 09/24/2015    No Known Allergies  Past Surgical History:  Procedure Laterality Date  . AMPUTATION TOE Right 01/15/2016   Procedure: AMPUTATION TOE;  Surgeon: Gwyneth Revels, DPM;  Location: [facility] ORS;  Service: Podiatry;  Laterality: Right;  . PERIPHERAL VASCULAR CATHETERIZATION Right 01/07/2016   Procedure: Abdominal Aortogram w/Lower Extremity;  Surgeon: Renford Dills, MD;  Location: [facility] INV[facility]IVE CV [facility];  Service: Cardiovascular;  Laterality: Right;  . TOE SURGERY     amputated 2 toes on L) foot    Social History   Tobacco Use  . Smoking status: Never Smoker  . Smokeless tobacco: Never Used  Vaping Use  . Vaping Use: Never used  Substance Use Topics  . Alcohol use: No    Alcohol/week: 0.0 standard drinks  .  Drug use: No     Medication list has been reviewed and updated.  Current Meds  Medication Sig  . amLODipine (NORVASC) 10 MG tablet Take 1 tablet (10 mg total) by mouth daily.  Marland Kitchen aspirin EC 81 MG tablet Take 1 tablet (81 mg total) by mouth daily.  . ferrous sulfate 325 (65 FE) MG tablet Take 1 tablet (325 mg total) by mouth daily with breakfast.  . glipiZIDE (GLUCOTROL XL) 10 MG 24 hr tablet Take 1 tablet (10 mg total) by mouth daily with  breakfast.  . hydrochlorothiazide (HYDRODIURIL) 12.5 MG tablet Take 1 tablet (12.5 mg total) by mouth daily.  Marland Kitchen lisinopril (ZESTRIL) 5 MG tablet Take 1 tablet (5 mg total) by mouth daily.  . metFORMIN (GLUCOPHAGE) 500 MG tablet Take 1 tablet (500 mg total) by mouth 2 (two) times daily.  . metoprolol tartrate (LOPRESSOR) 50 MG tablet Take 1 tablet (50 mg total) by mouth 2 (two) times daily.  Letta Pate ULTRA test strip USE AS DIRECTED BY  PHYSICIAN  . sitaGLIPtin (JANUVIA) 100 MG tablet Take 1 tablet (100 mg total) by mouth daily.    PHQ 2/9 Scores 12/05/2019 10/01/2019 09/30/2019 01/14/2019  PHQ - 2 Score 0 0 0 0  PHQ- 9 Score 0 0 - 0    GAD 7 : Generalized Anxiety Score 12/05/2019 10/01/2019  Nervous, Anxious, on Edge 0 0  Control/stop worrying 0 0  Worry too much - different things 0 0  Trouble relaxing 0 0  Restless 0 0  Easily annoyed or irritable 0 0  Afraid - awful might happen 0 0  Total GAD 7 Score 0 0  Anxiety Difficulty Not difficult at all -    BP Readings from Last 3 Encounters:  01/31/20 124/82  12/05/19 (!) 150/74  10/01/19 137/80    Physical Exam Vitals and nursing note reviewed.  HENT:     Head: Normocephalic.     Right Ear: External ear normal.     Left Ear: External ear normal.     Nose: Nose normal.  Eyes:     General: No scleral icterus.       Right eye: No discharge.        Left eye: No discharge.     Conjunctiva/sclera: Conjunctivae normal.     Pupils: Pupils are equal, round, and reactive to light.  Neck:     Thyroid: No thyromegaly.     Vascular: No JVD.     Trachea: No tracheal deviation.  Cardiovascular:     Rate and Rhythm: Normal rate and regular rhythm.     Pulses:          Dorsalis pedis pulses are 1+ on the left side.       Posterior tibial pulses are 1+ on the left side.     Heart sounds: Normal heart sounds. No murmur heard.  No friction rub. No gallop.   Pulmonary:     Effort: No respiratory distress.     Breath sounds: Normal breath  sounds. No wheezing or rales.  Abdominal:     General: Bowel sounds are normal.     Palpations: Abdomen is soft. There is no mass.     Tenderness: There is no abdominal tenderness. There is no guarding or rebound.  Musculoskeletal:        General: No tenderness. Normal range of motion.     Cervical back: Normal range of motion and neck supple.     Left foot: Normal range of  motion. No deformity or bunion.     Right Lower Extremity: Right leg is amputated below knee.  Feet:     Left foot:     Protective Sensation: 10 sites tested. 8 sites sensed.     Skin integrity: Dry skin present. No ulcer, blister, skin breakdown, erythema, warmth, callus or fissure.     Comments: 4th toe amputation Lymphadenopathy:     Cervical: No cervical adenopathy.  Skin:    General: Skin is warm.     Findings: No rash.  Neurological:     Mental Status: He is alert and oriented to person, place, and time.     Cranial Nerves: No cranial nerve deficit.     Deep Tendon Reflexes: Reflexes are normal and symmetric.     Wt Readings from Last 3 Encounters:  01/31/20 157 lb (71.2 kg)  12/05/19 156 lb (70.8 kg)  10/01/19 161 lb (73 kg)    BP 124/82   Pulse 68   Ht 6\' 2"  (1.88 m)   Wt 157 lb (71.2 kg)   BMI 20.16 kg/m   Assessment and Plan:  1. Familial hypercholesterolemia Chronic.  Uncontrolled.  Stable.  Unlikely that it is fasting and we was checking and it was an LDL of 109.  However given that he is diabetic and he has a history of amputation we will try to control more aggressively his lipid concerns.  We will start atorvastatin 10 mg once a day and will recheck in 4 months. - Lipid Panel With LDL/HDL Ratio - atorvastatin (LIPITOR) 10 MG tablet; Take 1 tablet (10 mg total) by mouth daily.  Dispense: 90 tablet; Refill: 3  2. Amputated below knee, right [facility]) Patient with amputation below the knee on the right.  Patient recently had revision of his prosthesis with prescriptions for socket liners  patient is seen in [facility] for this.  3. Type 2 diabetes mellitus with other circulatory complication, without long-term current use of insulin (HCC) .  Controlled.  Stable.  Last A1c 4 months ago was 6.9.  Patient continues to watch his diet and is currently on triple therapy of glipizide Januvia and Metformin.  He will continue these we will obtain an A1c at this time. - HgB A1c  4. Need for immunization against influenza Discussed and administered. - Flu Vaccine QUAD High Dose(Fluad)

## 2020-02-01 LAB — LIPID PANEL WITH LDL/HDL RATIO
Cholesterol, Total: 191 mg/dL (ref 100–199)
HDL: 53 mg/dL (ref >39)
LDL Chol Calc (NIH): 110 mg/dL — ABNORMAL HIGH (ref 0–99)
LDL/HDL Ratio: 2.1 ratio (ref 0.0–3.6)
Triglycerides: 162 mg/dL — ABNORMAL HIGH (ref 0–149)
VLDL Cholesterol Cal: 28 mg/dL (ref 5–40)

## 2020-02-01 LAB — HEMOGLOBIN A1C
Est. average glucose Bld gHb Est-mCnc: 151 mg/dL
Hgb A1c MFr Bld: 6.9 % — ABNORMAL HIGH (ref 4.8–5.6)

## 2020-03-06 ENCOUNTER — Telehealth: Payer: Self-pay

## 2020-03-06 ENCOUNTER — Other Ambulatory Visit: Payer: Self-pay

## 2020-03-06 DIAGNOSIS — S88111A Complete traumatic amputation at level between knee and ankle, right lower leg, initial encounter: Secondary | ICD-10-CM

## 2020-03-06 DIAGNOSIS — E0849 Diabetes mellitus due to underlying condition with other diabetic neurological complication: Secondary | ICD-10-CM

## 2020-03-06 MED ORDER — GABAPENTIN 100 MG PO CAPS: 100.0000 mg | ORAL_CAPSULE | Freq: Three times a day (TID) | ORAL | 1 refills | Status: DC

## 2020-03-06 NOTE — Telephone Encounter (Signed)
Copied from CRM [id number]. Topic: Referral - Request for Referral >> Mar 06, 2020 12:02 PM Randol Kern wrote: Has patient seen PCP for this complaint? Yes.   *If NO, is insurance requiring patient see PCP for this issue before PCP can refer them? Referral for which specialty: Orthopedist  Preferred provider/office: Dr. Ether Griffins (Foot/Leg) Reason for referral: Leg complications - soreness  Wife called

## 2020-03-06 NOTE — Progress Notes (Unsigned)
Put referral in for V and V and sent over Gabapentin 100mg  TID to 

## 2020-03-06 NOTE — Telephone Encounter (Signed)
Spoke to Dr Earney Mallet office- sent to [facility] and sent in Gabapentin 100mg  TID to [facility]

## 2020-03-07 ENCOUNTER — Other Ambulatory Visit: Payer: Self-pay | Admitting: Family Medicine

## 2020-03-07 DIAGNOSIS — I10 Essential (primary) hypertension: Secondary | ICD-10-CM

## 2020-03-07 DIAGNOSIS — E1159 Type 2 diabetes mellitus with other circulatory complications: Secondary | ICD-10-CM

## 2020-03-07 NOTE — Telephone Encounter (Signed)
Requested Prescriptions  Pending Prescriptions Disp Refills  . amLODipine (NORVASC) 10 MG tablet [Pharmacy Med Name: amLODIPine Besylate 10 MG Oral Tablet] 90 tablet 0    Sig: TAKE 1 TABLET BY MOUTH  DAILY     Cardiovascular:  Calcium Channel Blockers Passed - 03/07/2020  4:45 AM      Passed - Last BP in normal range    BP Readings from Last 1 Encounters:  01/31/20 124/82         Passed - Valid encounter within last 6 months    Recent Outpatient Visits          1 month ago Type 2 diabetes mellitus with other circulatory complication, without long-term current use of insulin ([place])   [facility] Juline Patch, MD   3 months ago Amputated below knee, right [facility])   [facility] Juline Patch, MD   5 months ago Type 2 diabetes mellitus with other circulatory complication, without long-term current use of insulin ([place])   [facility] Juline [facility], MD   9 months ago Type 2 diabetes mellitus with other circulatory complication, without long-term current use of insulin ([place])   [facility] Juline Patch, MD   1 year ago Essential hypertension   [place], Deanna C, MD      Future Appointments            In 1 month Juline Patch, MD [facility], [place]   In 2 months Juline Patch, MD [facility], [place]           . lisinopril (ZESTRIL) 5 MG tablet [Pharmacy Med Name: Lisinopril 5 MG Oral Tablet] 90 tablet 0    Sig: TAKE 1 TABLET BY MOUTH  DAILY     Cardiovascular:  ACE Inhibitors Passed - 03/07/2020  4:45 AM      Passed - Cr in normal range and within 180 days    Creatinine  Date Value Ref Range Status  11/12/2012 0.99 0.60 - 1.30 mg/dL Final   Creatinine, Ser  Date Value Ref Range Status  10/01/2019 0.90 0.76 - 1.27 mg/dL Final         Passed - K in normal range and within 180 days    Potassium  Date Value Ref Range Status  10/01/2019 4.4 3.5 - 5.2 mmol/L Final  11/12/2012 4.0 3.5 - 5.1  mmol/L Final         Passed - Patient is not pregnant      Passed - Last BP in normal range    BP Readings from Last 1 Encounters:  01/31/20 124/82         Passed - Valid encounter within last 6 months    Recent Outpatient Visits          1 month ago Type 2 diabetes mellitus with other circulatory complication, without long-term current use of insulin ([place])   [facility] Jul[facility], MD   3 months ago Amputated below knee, right [facility])   [facility] Juline Patch, MD   5 months ago Type 2 diabetes mellitus with other circulatory complication, without long-term current use of insulin ([place])   [facility], [facility]   9 months ago Type 2 diabetes mellitus with other circulatory complication, without long-term current use of insulin (HCC)   [facility] Jul[facility], MD   1 year ago Essential hypertension   [facility], [facility]      Future Appointments            In 1 month Juline Patch, MD [facility], [place]   In 2 months Juline Patch, MD [facility], [place]           . glipiZIDE (GLUCOTROL XL) 10 MG 24 hr tablet [Pharmacy Med Name: GLIPIZIDE  10MG  TAB  XL] 90 tablet 1    Sig: TAKE 1 TABLET BY MOUTH  DAILY WITH BREAKFAST     Endocrinology:  Diabetes - Sulfonylureas Passed - 03/07/2020  4:45 AM      Passed - HBA1C is between 0 and 7.9 and within 180 days    Hgb A1c MFr Bld  Date Value Ref Range Status  01/31/2020 6.9 (H) 4.8 - 5.6 % Final    Comment:             Prediabetes: 5.7 - 6.4          Diabetes: >6.4          Glycemic control for adults with diabetes: <7.0          Passed - Valid encounter within last 6 months    Recent Outpatient Visits          1 month ago Type 2 diabetes mellitus with other circulatory complication, without long-term current use of insulin ([place])   [facility]   3 months ago Amputated below knee, right [facility])    [facility], MD   5 months ago Type 2 diabetes mellitus with other circulatory complication, without long-term current use of insulin ([place])   [facility] Juline [facility], MD   9 months ago Type 2 diabetes mellitus with other circulatory complication, without long-term current use of insulin ([place])   [facility] Juline Patch, MD   1 year ago Essential hypertension   [place], Deanna C, MD      Future Appointments            In 1 month Juline Patch, MD [facility], [place]   In 2 months Juline Patch, MD [facility], [facility]           . metFORMIN (GLUCOPHAGE) 500 MG tablet [Pharmacy Med Name: metFORMIN HCl 500 MG Oral Tablet] 180 tablet 1    Sig: TAKE 1 TABLET BY MOUTH  TWICE DAILY     Endocrinology:  Diabetes - Biguanides Passed - 03/07/2020  4:45 AM      Passed - Cr in normal range and within 360 days    Creatinine  Date Value Ref Range Status  11/12/2012 0.99 0.60 - 1.30 mg/dL Final   Creatinine, Ser  Date Value Ref Range Status  10/01/2019 0.90 0.76 - 1.27 mg/dL Final         Passed - HBA1C is between 0 and 7.9 and within 180 days    Hgb A1c MFr Bld  Date Value Ref Range Status  01/31/2020 6.9 (H) 4.8 - 5.6 % Final    Comment:             Prediabetes: 5.7 - 6.4          Diabetes: >6.4          Glycemic control for adults with diabetes: <7.0          Passed - AA eGFR in normal range  and within 360 days    EGFR (African American)  Date Value Ref Range Status  11/12/2012 >60  Final   GFR calc Af Amer  Date Value Ref Range Status  10/01/2019 90 >59 mL/min/1.73 Final    Comment:    **[facility] currently reports eGFR in compliance with the current**   recommendations of the [employer]. [facility] will   update reporting as new guidelines are published from the NKF-ASN   Task force.    EGFR (Non-African Amer.)  Date Value Ref Range Status  11/12/2012 >60  Final     Comment:    eGFR values <63m/min/1.73 m2 may be an indication of chronic kidney disease (CKD). Calculated eGFR is useful in patients with stable renal function. The eGFR calculation will not be reliable in acutely ill patients when serum creatinine is changing rapidly. It is not useful in  patients on dialysis. The eGFR calculation may not be applicable to patients at the low and high extremes of body sizes, pregnant women, and vegetarians.    GFR calc non Af Amer  Date Value Ref Range Status  10/01/2019 78 >59 mL/min/1.73 Final         Passed - Valid encounter within last 6 months    Recent Outpatient Visits          1 month ago Type 2 diabetes mellitus with other circulatory complication, without long-term current use of insulin ([place]   [facility]   3 months ago [facility]putated below knee, right ([facility]   [facility]   5 months ago Type 2 diabetes mellitus with other circulatory complication, without long-term current use of insulin ([place]   [facility]   9 months ago Type 2 diabetes mellitus with other circulatory complication, without long-term current use of insulin ([place]   [facility]   1 year ago Essential hypertension   [facility]      Future Appointments            In 1 month [facility]   In 2 months [facility]           . metoprolol tartrate (LOPRESSOR) 50 MG tablet [Pharmacy Med Name: Metoprolol Tartrate 50 MG Oral Tablet] 180 tablet 1    Sig: TAKE 1 TABLET BY MOUTH  TWICE DAILY     Cardiovascular:  Beta Blockers Passed - 03/07/2020  4:45 AM      Passed - Last BP in normal range    BP Readings from Last 1 Encounters:  01/31/20 124/82         Passed - Last Heart Rate in normal range    Pulse Readings from Last 1 Encounters:  01/31/20 68          Passed - Valid encounter within last 6 months    Recent Outpatient Visits          1 month ago Type 2 diabetes mellitus with other circulatory complication, without long-term current use of insulin ([place]   [facility]   3 months ago Amputated below knee, right ([facility]   [facility]   5 months ago Type 2 diabetes mellitus with other circulatory complication, without long-term current use of insulin ([place]   [facility]   9 months ago Type 2 diabetes mellitus with other circulatory complication, without long-term current use of insulin ([place])   [facility] Jul[facility], MD   1 year ago Essential hypertension   [place], MD      Future Appointments            In 1 month Juline Patch, MD [facility], [facility]   In 2 months Juline Patch, MD [facility], [facility]           . hydrochlorothiazide (HYDRODIURIL) 12.5 MG tablet [Pharmacy Med Name: hydroCHLOROthiazide 12.5 MG Oral Tablet] 90 tablet 1    Sig: TAKE 1 TABLET BY MOUTH  DAILY     Cardiovascular: Diuretics - Thiazide Passed - 03/07/2020  4:45 AM      Passed - Ca in normal range and within 360 days    Calcium  Date Value Ref Range Status  10/01/2019 10.0 8.6 - 10.2 mg/dL Final   Calcium, Total  Date Value Ref Range Status  11/12/2012 9.4 8.5 - 10.1 mg/dL Final         Passed - Cr in normal range and within 360 days    Creatinine  Date Value Ref Range Status  11/12/2012 0.99 0.60 - 1.30 mg/dL Final   Creatinine, Ser  Date Value Ref Range Status  10/01/2019 0.90 0.76 - 1.27 mg/dL Final         Passed - K in normal range and within 360 days    Potassium  Date Value Ref Range Status  10/01/2019 4.4 3.5 - 5.2 mmol/L Final  11/12/2012 4.0 3.5 - 5.1 mmol/L Final         Passed - Na in normal range and within 360 days    Sodium  Date Value Ref Range Status  10/01/2019 140  134 - 144 mmol/L Final  11/12/2012 141 136 - 145 mmol/L Final         Passed - Last BP in normal range    BP Readings from Last 1 Encounters:  01/31/20 124/82         Passed - Valid encounter within last 6 months    Recent Outpatient Visits          1 month ago Type 2 diabetes mellitus with other circulatory complication, without long-term current use of insulin ([place])   [facility], MD   3 months ago Amputated below knee, right [facility])   [facility] Jul[facility], MD   5 months ago Type 2 diabetes mellitus with other circulatory complication, without long-term current use of insulin ([place])   [facility] MD   9 months ago Type 2 diabetes mellitus with other circulatory complication, without long-term current use of insulin ([place])   [facility] Juline [facility], MD   1 year ago Essential hypertension   [place], Deanna C, MD      Future Appointments            In 1 month Juline Patch, MD [facility], [place]   In 2 months Juline Patch, MD [facility], [place]           . JANUVIA 100 MG tablet [Pharmacy Med Name: Januvia 100 MG Oral Tablet] 90 tablet 1    Sig: TAKE 1 TABLET BY MOUTH  DAILY     Endocrinology:  Diabetes - DPP-4 Inhibitors Passed - 03/07/2020  4:45 AM      Passed - HBA1C is between 0 and 7.9 and within 180 days    Hgb A1c MFr Bld  Date Value Ref Range Status  01/31/2020 6.9 (H) 4.8 - 5.6 % Final    Comment:             Prediabetes: 5.7 - 6.4          Diabetes: >6.4          Glycemic control for adults with diabetes: <7.0          Passed - Cr in normal range and within 360 days    Creatinine  Date Value Ref Range Status  11/12/2012 0.99 0.60 - 1.30 mg/dL Final   Creatinine, Ser  Date Value Ref Range Status  10/01/2019 0.90 0.76 - 1.27 mg/dL Final         Passed - Valid encounter within last 6 months    Recent Outpatient Visits          1  month ago Type 2 diabetes mellitus with other circulatory complication, without long-term current use of insulin ([place])   [facility] Jul[facility], MD   3 months ago Amputated below knee, right [facility])   [facility], MD   5 months ago Type 2 diabetes mellitus with other circulatory complication, without long-term current use of insulin ([place])   [facility] MD   9 months ago Type 2 diabetes mellitus with other circulatory complication, without long-term current use of insulin ([place])   [facility] Juline [facility], MD   1 year ago Essential hypertension   [place], Deanna C, MD      Future Appointments            In 1 month Juline Patch, MD [facility], [place]   In 2 months Juline Patch, MD [facility], [facility]

## 2020-03-30 ENCOUNTER — Encounter (INDEPENDENT_AMBULATORY_CARE_PROVIDER_SITE_OTHER): Payer: Self-pay | Admitting: Vascular Surgery

## 2020-03-30 ENCOUNTER — Other Ambulatory Visit: Payer: Self-pay

## 2020-03-30 ENCOUNTER — Ambulatory Visit (INDEPENDENT_AMBULATORY_CARE_PROVIDER_SITE_OTHER): Payer: Medicare Other | Admitting: Vascular Surgery

## 2020-03-30 VITALS — BP 183/80 | HR 84 | Ht 71.0 in | Wt 154.0 lb

## 2020-03-30 DIAGNOSIS — I70212 Atherosclerosis of native arteries of extremities with intermittent claudication, left leg: Secondary | ICD-10-CM

## 2020-03-30 DIAGNOSIS — E1152 Type 2 diabetes mellitus with diabetic peripheral angiopathy with gangrene: Secondary | ICD-10-CM

## 2020-03-30 DIAGNOSIS — Z794 Long term (current) use of insulin: Secondary | ICD-10-CM

## 2020-03-30 DIAGNOSIS — I70219 Atherosclerosis of native arteries of extremities with intermittent claudication, unspecified extremity: Secondary | ICD-10-CM | POA: Insufficient documentation

## 2020-03-30 DIAGNOSIS — T879 Unspecified complications of amputation stump: Secondary | ICD-10-CM | POA: Insufficient documentation

## 2020-03-30 DIAGNOSIS — I10 Essential (primary) hypertension: Secondary | ICD-10-CM

## 2020-03-30 DIAGNOSIS — E7801 Familial hypercholesterolemia: Secondary | ICD-10-CM

## 2020-03-30 NOTE — Progress Notes (Signed)
MRN : [id number]  Jose Schroeder is a 86 y.o. (Jun 03, 1932) male who presents with chief complaint of  Chief Complaint  Patient presents with  . New Patient (Initial Visit)    tate. Stump pain  .  History of Present Illness:   The patient was last seen in 12/2015.   The patient is seen today for evaluation of painful lower extremities and diminished pulses on the left.  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  He is having pain at the end of his stump when he wears his prosthesis.  Patient notes the left leg pain is always associated with activity and is very consistent day today. Typically, the pain occurs at less than one block, progress is as activity continues to the point that the patient must stop walking. Resting including standing still for several minutes allowed resumption of the activity and the ability to walk a similar distance before stopping again. Uneven terrain and inclined shorten the distance. The pain has been progressive over the past several years. The patient states the inability to walk is now having a profound negative impact on quality of life and daily activities.  The patient denies rest pain or dangling of an extremity off the side of the bed during the night for relief. No open wounds or sores at this time. No prior interventions or surgeries.  No history of back problems or DJD of the lumbar sacral spine.   The patient denies changes in claudication symptoms or new rest pain symptoms.  No new ulcers or wounds of the foot.  The patient's blood pressure has been stable and relatively well controlled. The patient denies amaurosis fugax or recent TIA symptoms. There are no recent neurological changes noted. The patient denies history of DVT, PE or superficial thrombophlebitis. The patient denies recent episodes of angina or shortness of breath.   Current Meds  Medication Sig  . amLODipine (NORVASC) 10 MG tablet TAKE 1 TABLET BY MOUTH  DAILY  .  aspirin EC 81 MG tablet Take 1 tablet (81 mg total) by mouth daily.  Marland Kitchen atorvastatin (LIPITOR) 10 MG tablet Take 1 tablet (10 mg total) by mouth daily.  . ferrous sulfate 325 (65 FE) MG tablet Take 1 tablet (325 mg total) by mouth daily with breakfast.  . gabapentin (NEURONTIN) 100 MG capsule Take 1 capsule (100 mg total) by mouth 3 (three) times daily.  Marland Kitchen glipiZIDE (GLUCOTROL XL) 10 MG 24 hr tablet TAKE 1 TABLET BY MOUTH  DAILY WITH BREAKFAST  . hydrochlorothiazide (HYDRODIURIL) 12.5 MG tablet TAKE 1 TABLET BY MOUTH  DAILY  . JANUVIA 100 MG tablet TAKE 1 TABLET BY MOUTH  DAILY  . lisinopril (ZESTRIL) 5 MG tablet TAKE 1 TABLET BY MOUTH  DAILY  . metFORMIN (GLUCOPHAGE) 500 MG tablet TAKE 1 TABLET BY MOUTH  TWICE DAILY  . metoprolol tartrate (LOPRESSOR) 50 MG tablet TAKE 1 TABLET BY MOUTH  TWICE DAILY  . ONETOUCH ULTRA test strip USE AS DIRECTED BY  PHYSICIAN    Past Medical History:  Diagnosis Date  . Diabetes mellitus without complication (HCC)   . Hypertension   . Peripheral vascular disease [facility])     Past Surgical History:  Procedure Laterality Date  . AMPUTATION TOE Right 01/15/2016   Procedure: AMPUTATION TOE;  Surgeon: Gwyneth Revels, DPM;  Location: [facility] ORS;  Service: Podiatry;  Laterality: Right;  . PERIPHERAL VASCULAR CATHETERIZATION Right 01/07/2016   Procedure: Abdominal Aortogram w/Lower Extremity;  Surgeon:  Renford Dills, MD;  Location: [facility] INVASIVE CV LAB;  Service: Cardiovascular;  Laterality: Right;  . TOE SURGERY     amputated 2 toes on L) foot    Social History Social History   Tobacco Use  . Smoking status: Never Smoker  . Smokeless tobacco: Never Used  Vaping Use  . Vaping Use: Never used  Substance Use Topics  . Alcohol use: No    Alcohol/week: 0.0 standard drinks  . Drug use: No    Family History Family History  Problem Relation Age of Onset  . Diabetes Mother   . Hypertension Mother   . Diabetes Father   . Hypertension Father   . Diabetes  Daughter     No Known Allergies   REVIEW OF SYSTEMS (Negative unless checked)  Constitutional: [] Weight loss  [] Fever  [] Chills Cardiac: [] Chest pain   [] Chest pressure   [] Palpitations   [] Shortness of breath when laying flat   [] Shortness of breath with exertion. Vascular:  [x] Pain in legs with walking   [] Pain in legs at rest  [] History of DVT   [] Phlebitis   [] Swelling in legs   [] Varicose veins   [] Non-healing ulcers Pulmonary:   [] Uses home oxygen   [] Productive cough   [] Hemoptysis   [] Wheeze  [] COPD   [] Asthma Neurologic:  [] Dizziness   [] Seizures   [] History of stroke   [] History of TIA  [] Aphasia   [] Vissual changes   [] Weakness or numbness in arm   [x] Weakness or numbness in leg Musculoskeletal:   [] Joint swelling   [x] Joint pain   [] Low back pain Hematologic:  [] Easy bruising  [] Easy bleeding   [] Hypercoagulable state   [] Anemic Gastrointestinal:  [] Diarrhea   [] Vomiting  [] Gastroesophageal reflux/heartburn   [] Difficulty swallowing. Genitourinary:  [] Chronic kidney disease   [] Difficult urination  [] Frequent urination   [] Blood in urine Skin:  [] Rashes   [] Ulcers  Psychological:  [] History of anxiety   []  History of major depression.  Physical Examination  Vitals:   03/30/20 1320  BP: (!) 183/80  Pulse: 84  Weight: 154 lb (69.9 kg)  Height: 5\' 11"  (1.803 m)   Body mass index is 21.48 kg/m. Gen: WD/WN, NAD Head: [place]/AT, No temporalis wasting.  Ear/Nose/Throat: Hearing grossly intact, nares w/o erythema or drainage Eyes: PER, EOMI, sclera nonicteric.  Neck: Supple, no large masses.   Pulmonary:  Good air movement, no audible wheezing bilaterally, no use of accessory muscles.  Cardiac: RRR, no JVD Vascular: The right BKA stump has a bulky callous at the end which is tender and cracked.  Multiple toe amputations on the left including the great toe all well healed, no left leg ulcers Vessel Right Left  Radial Palpable Palpable  PT BKA Not Palpable  DP BKA Not Palpable   Gastrointestinal: Non-distended. No guarding/no peritoneal signs.  Musculoskeletal: M/S 5/5 throughout.  + deformity, see above.  Neurologic: CN 2-12 intact. Symmetrical.  Speech is fluent. Motor exam as listed above. Psychiatric: Judgment intact, Mood & affect appropriate for pt's clinical situation. Dermatologic: No rashes or ulcers noted.  No changes consistent with cellulitis.   CBC Lab Results  Component Value Date   WBC 6.2 05/17/2019   HGB 11.4 (L) 10/01/2019   HCT 33.2 (L) 05/17/2019   MCV 87 05/17/2019   PLT 260 05/17/2019    BMET    Component Value Date/Time   NA 140 10/01/2019 1550   NA 141 11/12/2012 0717   K 4.4 10/01/2019 1550   K 4.0 11/12/2012  0717   CL 101 10/01/2019 1550   CL 106 11/12/2012 0717   CO2 24 10/01/2019 1550   CO2 31 11/12/2012 0717   GLUCOSE 180 (H) 10/01/2019 1550   GLUCOSE 138 (H) 01/06/2016 0432   GLUCOSE 149 (H) 11/12/2012 0717   BUN 13 10/01/2019 1550   BUN 10 11/12/2012 0717   CREATININE 0.90 10/01/2019 1550   CREATININE 0.99 11/12/2012 0717   CALCIUM 10.0 10/01/2019 1550   CALCIUM 9.4 11/12/2012 0717   GFRNONAA 78 10/01/2019 1550   GFRNONAA >60 11/12/2012 0717   GFRAA 90 10/01/2019 1550   GFRAA >60 11/12/2012 0717   CrCl cannot be calculated (Patient's most recent lab result is older than the maximum 21 days allowed.).  COAG Lab Results  Component Value Date   INR 1.05 01/04/2016    Radiology No results found.   Assessment/Plan 1. Atherosclerosis of native artery of left lower extremity with intermittent claudication (HCC)  Recommend:  The patient has evidence of atherosclerosis of the lower extremities with claudication.    Noninvasive studies are over do and will be ordered.  No invasive studies, angiography or surgery at this time The patient should continue walking and begin a more formal exercise program.  The patient should continue antiplatelet therapy and aggressive treatment of the lipid  abnormalities  No changes in the patient's medications at this time  - VAS [place] LOWER EXTREMITY ARTERIAL DUPLEX; Future - VAS [place] ABI WITH/WO TBI; Future  2. BKA stump complication (HCC) I have been in contact with [employer] and given the patient a Rx for modifying his existing prosthesis  3. Essential hypertension Continue antihypertensive medications as already ordered, these medications have been reviewed and there are no changes at this time.   4. Type 2 diabetes mellitus with diabetic peripheral angiopathy and gangrene, with long-term current use of insulin (HCC) Continue hypoglycemic medications as already ordered, these medications have been reviewed and there are no changes at this time.  Hgb A1C to be monitored as already arranged by primary service   5. Familial hypercholesterolemia Continue statin as ordered and reviewed, no changes at this time    [name], MD  03/30/2020 8:35 PM

## 2020-04-13 ENCOUNTER — Other Ambulatory Visit: Payer: Self-pay

## 2020-04-13 ENCOUNTER — Ambulatory Visit (INDEPENDENT_AMBULATORY_CARE_PROVIDER_SITE_OTHER): Payer: Medicare Other | Admitting: Family Medicine

## 2020-04-13 ENCOUNTER — Encounter: Payer: Self-pay | Admitting: Family Medicine

## 2020-04-13 VITALS — BP 130/98 | HR 80

## 2020-04-13 DIAGNOSIS — E0849 Diabetes mellitus due to underlying condition with other diabetic neurological complication: Secondary | ICD-10-CM

## 2020-04-13 DIAGNOSIS — E7801 Familial hypercholesterolemia: Secondary | ICD-10-CM

## 2020-04-13 DIAGNOSIS — E1159 Type 2 diabetes mellitus with other circulatory complications: Secondary | ICD-10-CM

## 2020-04-13 DIAGNOSIS — D508 Other iron deficiency anemias: Secondary | ICD-10-CM

## 2020-04-13 DIAGNOSIS — I10 Essential (primary) hypertension: Secondary | ICD-10-CM

## 2020-04-13 MED ORDER — LISINOPRIL 10 MG PO TABS: 10.0000 mg | ORAL_TABLET | Freq: Every day | ORAL | 1 refills | Status: DC

## 2020-04-13 MED ORDER — SITAGLIPTIN PHOSPHATE 100 MG PO TABS: 100.0000 mg | ORAL_TABLET | Freq: Every day | ORAL | 1 refills | Status: DC

## 2020-04-13 MED ORDER — AMLODIPINE BESYLATE 10 MG PO TABS: 10.0000 mg | ORAL_TABLET | Freq: Every day | ORAL | 1 refills | Status: DC

## 2020-04-13 MED ORDER — METFORMIN HCL 500 MG PO TABS
500.0000 mg | ORAL_TABLET | Freq: Two times a day (BID) | ORAL | 1 refills | Status: DC
Start: 2020-04-13 — End: 2020-08-24

## 2020-04-13 MED ORDER — HYDROCHLOROTHIAZIDE 25 MG PO TABS: 25.0000 mg | ORAL_TABLET | Freq: Every day | ORAL | 1 refills | Status: DC

## 2020-04-13 MED ORDER — GABAPENTIN 100 MG PO CAPS: 100.0000 mg | ORAL_CAPSULE | Freq: Three times a day (TID) | ORAL | 1 refills | Status: DC

## 2020-04-13 MED ORDER — METOPROLOL TARTRATE 50 MG PO TABS: 50.0000 mg | ORAL_TABLET | Freq: Two times a day (BID) | ORAL | 1 refills | Status: DC

## 2020-04-13 MED ORDER — FERROUS SULFATE 325 (65 FE) MG PO TABS: 325.0000 mg | ORAL_TABLET | Freq: Every day | ORAL | 1 refills | Status: AC

## 2020-04-13 MED ORDER — GLIPIZIDE ER 10 MG PO TB24: 10.0000 mg | ORAL_TABLET | Freq: Every day | ORAL | 1 refills | Status: DC

## 2020-04-13 MED ORDER — ATORVASTATIN CALCIUM 20 MG PO TABS: 20.0000 mg | ORAL_TABLET | Freq: Every day | ORAL | 3 refills | Status: DC

## 2020-04-13 NOTE — Progress Notes (Signed)
Date:  04/13/2020   Name:  Jose Schroeder   DOB:  January 29, 1933   MRN:  [id number]   Chief Complaint: Leg Pain (R) leg pain on stump- looks like it may have been a sore there that is now healing. It has been sore for "about 3 or 4 weeks, hurts to walk on it")  Hypertension This is a chronic problem. The current episode started more than 1 year ago. The problem has been waxing and waning since onset. The problem is uncontrolled. Pertinent negatives include no anxiety, blurred vision, chest pain, headaches, malaise/fatigue, neck pain, orthopnea, palpitations, peripheral edema, PND, shortness of breath or sweats. There are no associated agents to hypertension.    Lab Results  Component Value Date   CREATININE 0.90 10/01/2019   BUN 13 10/01/2019   NA 140 10/01/2019   K 4.4 10/01/2019   CL 101 10/01/2019   CO2 24 10/01/2019   Lab Results  Component Value Date   CHOL 191 01/31/2020   HDL 53 01/31/2020   LDLCALC 110 (H) 01/31/2020   TRIG 162 (H) 01/31/2020   CHOLHDL 3.0 12/15/2016   No results found for: TSH Lab Results  Component Value Date   HGBA1C 6.9 (H) 01/31/2020   Lab Results  Component Value Date   WBC 6.2 05/17/2019   HGB 11.4 (L) 10/01/2019   HCT 33.2 (L) 05/17/2019   MCV 87 05/17/2019   PLT 260 05/17/2019   Lab Results  Component Value Date   ALT 7 05/17/2019   AST 17 05/17/2019   ALKPHOS 53 05/17/2019   BILITOT 0.3 05/17/2019     Review of Systems  Constitutional: Negative for chills, fever and malaise/fatigue.  HENT: Negative for drooling, ear discharge, ear pain and sore throat.   Eyes: Negative for blurred vision.  Respiratory: Negative for cough, shortness of breath and wheezing.   Cardiovascular: Negative for chest pain, palpitations, orthopnea, leg swelling and PND.  Gastrointestinal: Negative for abdominal pain, blood in stool, constipation, diarrhea and nausea.  Endocrine: Negative for polydipsia.  Genitourinary: Negative for dysuria,  frequency, hematuria and urgency.  Musculoskeletal: Negative for back pain, myalgias and neck pain.  Skin: Negative for rash.  Allergic/Immunologic: Negative for environmental allergies.  Neurological: Negative for dizziness and headaches.  Hematological: Does not bruise/bleed easily.  Psychiatric/Behavioral: Negative for suicidal ideas. The patient is not nervous/anxious.     Patient Active Problem List   Diagnosis Date Noted  . Atherosclerosis of native arteries of extremity with intermittent claudication (HCC) 03/30/2020  . BKA stump complication (HCC) 03/30/2020  . Absolute anemia 10/01/2019  . Familial hypercholesterolemia 10/01/2019  . 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 Amputated below knee, right (HCC) 05/09/2017  . PVD (peripheral vascular disease) (HCC) 12/15/2016  . 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 Ulcer of right foot (HCC) 01/31/2016  . Amputated great toe (HCC) 01/11/2016  . Toe infection 01/04/2016  . Diabetes mellitus (HCC) 09/24/2015  . Essential hypertension 09/24/2015    No Known Allergies  Past Surgical History:  Procedure Laterality Date  . AMPUTATION TOE Right 01/15/2016   Procedure: AMPUTATION TOE;  Surgeon: Gwyneth Revels, DPM;  Location: [facility] ORS;  Service: Podiatry;  Laterality: Right;  . PERIPHERAL VASCULAR CATHETERIZATION Right 01/07/2016   Procedure: Abdominal Aortogram w/Lower Extremity;  Surgeon: Renford Dills, MD;  Location: [facility] INVASIVE CV [facility];  Service: Cardiovascular;  Laterality: Right;  . TOE SURGERY     amputated 2 toes on L)  foot    Social History   Tobacco Use  . Smoking status: Never Smoker  . Smokeless tobacco: Never Used  Vaping Use  . Vaping Use: Never used  Substance Use Topics  . Alcohol use: No    Alcohol/week: 0.0 standard drinks  . Drug use: No     Medication list has been reviewed and updated.  Current Meds  Medication Sig  . amLODipine (NORVASC) 10 MG tablet TAKE 1 TABLET BY MOUTH   DAILY  . aspirin EC 81 MG tablet Take 1 tablet (81 mg total) by mouth daily.  Marland Kitchen. atorvastatin (LIPITOR) 10 MG tablet Take 1 tablet (10 mg total) by mouth daily.  . ferrous sulfate 325 (65 FE) MG tablet Take 1 tablet (325 mg total) by mouth daily with breakfast.  . gabapentin (NEURONTIN) 100 MG capsule Take 1 capsule (100 mg total) by mouth 3 (three) times daily.  Marland Kitchen. glipiZIDE (GLUCOTROL XL) 10 MG 24 hr tablet TAKE 1 TABLET BY MOUTH  DAILY WITH BREAKFAST  . hydrochlorothiazide (HYDRODIURIL) 12.5 MG tablet TAKE 1 TABLET BY MOUTH  DAILY  . JANUVIA 100 MG tablet TAKE 1 TABLET BY MOUTH  DAILY  . lisinopril (ZESTRIL) 5 MG tablet TAKE 1 TABLET BY MOUTH  DAILY  . metFORMIN (GLUCOPHAGE) 500 MG tablet TAKE 1 TABLET BY MOUTH  TWICE DAILY  . metoprolol tartrate (LOPRESSOR) 50 MG tablet TAKE 1 TABLET BY MOUTH  TWICE DAILY  . ONETOUCH ULTRA test strip USE AS DIRECTED BY  PHYSICIAN    PHQ 2/9 Scores 12/05/2019 10/01/2019 09/30/2019 01/14/2019  PHQ - 2 Score 0 0 0 0  PHQ- 9 Score 0 0 - 0    GAD 7 : Generalized Anxiety Score 12/05/2019 10/01/2019  Nervous, Anxious, on Edge 0 0  Control/stop worrying 0 0  Worry too much - different things 0 0  Trouble relaxing 0 0  Restless 0 0  Easily annoyed or irritable 0 0  Afraid - awful might happen 0 0  Total GAD 7 Score 0 0  Anxiety Difficulty Not difficult at all -    BP Readings from Last 3 Encounters:  04/13/20 (!) 130/98  03/30/20 (!) 183/80  01/31/20 124/82    Physical Exam Vitals and nursing note reviewed.  HENT:     Head: Normocephalic.     Right Ear: Tympanic membrane and external ear normal.     Left Ear: Tympanic membrane and external ear normal.     Nose: Nose normal.     Mouth/Throat:     Mouth: Oropharynx is clear and moist.  Eyes:     General: No scleral icterus.       Right eye: No discharge.        Left eye: No discharge.     Extraocular Movements: EOM normal.     Conjunctiva/sclera: Conjunctivae normal.     Pupils: Pupils are equal,  round, and reactive to light.  Neck:     Thyroid: No thyromegaly.     Vascular: No JVD.     Trachea: No tracheal deviation.  Cardiovascular:     Rate and Rhythm: Normal rate and regular rhythm.     Pulses: Intact distal pulses.     Heart sounds: Normal heart sounds. No murmur heard. No friction rub. No gallop.   Pulmonary:     Effort: No respiratory distress.     Breath sounds: Normal breath sounds. No wheezing or rales.  Abdominal:     General: Bowel sounds are normal.  Palpations: Abdomen is soft. There is no hepatosplenomegaly or mass.     Tenderness: There is no abdominal tenderness. There is no CVA tenderness, guarding or rebound.  Musculoskeletal:        General: No tenderness or edema. Normal range of motion.     Cervical back: Normal range of motion and neck supple.  Lymphadenopathy:     Cervical: No cervical adenopathy.  Skin:    General: Skin is warm.     Findings: No rash.  Neurological:     Mental Status: He is alert and oriented to person, place, and time.     Cranial Nerves: No cranial nerve deficit.     Deep Tendon Reflexes: Strength normal and reflexes are normal and symmetric.     Wt Readings from Last 3 Encounters:  03/30/20 154 lb (69.9 kg)  01/31/20 157 lb (71.2 kg)  12/05/19 156 lb (70.8 kg)    BP (!) 130/98   Pulse 80   Assessment and Plan: Patient's chart was reevaluated for previous encounters, most recent labs, most recent imaging and care everywhere. 1. Type 2 diabetes mellitus with other circulatory complication, without long-term current use of insulin (HCC) Chronic.  Relatively controlled.  Stable.  Last several A1c's in the 6.9 range.  We will continue glipizide XL 10 mg once a day Januvia 100 mg 1 a day and Metformin 500 mg 1 twice a day.  We will recheck patient in 6 weeks at which time we will do labs including A1c. - glipiZIDE (GLUCOTROL XL) 10 MG 24 hr tablet; Take 1 tablet (10 mg total) by mouth daily with breakfast.  Dispense: 90  tablet; Refill: 1 - sitaGLIPtin (JANUVIA) 100 MG tablet; Take 1 tablet (100 mg total) by mouth daily.  Dispense: 90 tablet; Refill: 1 - metFORMIN (GLUCOPHAGE) 500 MG tablet; Take 1 tablet (500 mg total) by mouth 2 (two) times daily.  Dispense: 180 tablet; Refill: 1  2. Essential hypertension Chronic.  Uncontrolled.  Stable.  Last several readings have been elevated and today's reading is 130/98.  We will make changes on his medications including increasing his hydrochlorothiazide to 25 mg once a day and increasing his lisinopril to 10 mg once a day.  We will continue his metoprolol 50 mg 1 twice a day and amlodipine 10 mg at the present dosing's.  Patient will return in 6 weeks for recheck of blood pressure readings. - hydrochlorothiazide (HYDRODIURIL) 25 MG tablet; Take 1 tablet (25 mg total) by mouth daily.  Dispense: 90 tablet; Refill: 1 - lisinopril (ZESTRIL) 10 MG tablet; Take 1 tablet (10 mg total) by mouth daily.  Dispense: 90 tablet; Refill: 1 - metoprolol tartrate (LOPRESSOR) 50 MG tablet; Take 1 tablet (50 mg total) by mouth 2 (two) times daily.  Dispense: 180 tablet; Refill: 1 - amLODipine (NORVASC) 10 MG tablet; Take 1 tablet (10 mg total) by mouth daily.  Dispense: 90 tablet; Refill: 1  3. Familial hypercholesterolemia Chronic.  Controlled.  Stable.  Noting that the LDL is in the 110 range and would benefit from being under 99.  We will increase atorvastatin to 20 mg and recheck lipid level in 6 weeks. - atorvastatin (LIPITOR) 20 MG tablet; Take 1 tablet (20 mg total) by mouth daily.  Dispense: 90 tablet; Refill: 3  4. 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  Controlled.  Stable.  Continue gabapentin 100 mg 3 times a day. - gabapentin (NEURONTIN) 100 MG capsule; Take 1  capsule (100 mg total) by mouth 3 (three) times daily.  Dispense: 90 capsule; Refill: 1  5. Other iron deficiency anemia Chronic.   Controlled.  Stable.  Continue ferrous sulfate 325 mg once a day. - ferrous sulfate 325 (65 FE) MG tablet; Take 1 tablet (325 mg total) by mouth daily with breakfast.  Dispense: 90 tablet; Refill: 1

## 2020-04-14 ENCOUNTER — Telehealth: Payer: Self-pay

## 2020-04-14 NOTE — Telephone Encounter (Signed)
Called and spoke to wife about doses on meds

## 2020-04-14 NOTE — Telephone Encounter (Unsigned)
Copied from CRM [id number]. Topic: Quick Communication - See Telephone Encounter >> Apr 14, 2020 11:34 AM Aretta Nip wrote: CRM for notification. See Telephone encounter for: 04/14/20. Pt wife has requested for Dr Yetta Barre nurse to call her back re her husband. [phone] discuss med suggested yesterday, has a couple questions.

## 2020-04-23 ENCOUNTER — Ambulatory Visit (INDEPENDENT_AMBULATORY_CARE_PROVIDER_SITE_OTHER): Payer: Medicare Other

## 2020-04-23 ENCOUNTER — Encounter (INDEPENDENT_AMBULATORY_CARE_PROVIDER_SITE_OTHER): Payer: Medicare Other

## 2020-04-23 ENCOUNTER — Ambulatory Visit (INDEPENDENT_AMBULATORY_CARE_PROVIDER_SITE_OTHER): Payer: Medicare Other | Admitting: Nurse Practitioner

## 2020-04-23 ENCOUNTER — Other Ambulatory Visit: Payer: Self-pay

## 2020-04-23 ENCOUNTER — Encounter (INDEPENDENT_AMBULATORY_CARE_PROVIDER_SITE_OTHER): Payer: Self-pay | Admitting: Nurse Practitioner

## 2020-04-23 VITALS — BP 168/69 | HR 80 | Resp 16 | Ht 72.0 in | Wt 156.0 lb

## 2020-04-23 DIAGNOSIS — T879 Unspecified complications of amputation stump: Secondary | ICD-10-CM | POA: Diagnosis not present

## 2020-04-23 DIAGNOSIS — I70212 Atherosclerosis of native arteries of extremities with intermittent claudication, left leg: Secondary | ICD-10-CM

## 2020-04-23 DIAGNOSIS — E7801 Familial hypercholesterolemia: Secondary | ICD-10-CM | POA: Diagnosis not present

## 2020-04-23 DIAGNOSIS — I10 Essential (primary) hypertension: Secondary | ICD-10-CM | POA: Diagnosis not present

## 2020-04-23 DIAGNOSIS — E1152 Type 2 diabetes mellitus with diabetic peripheral angiopathy with gangrene: Secondary | ICD-10-CM | POA: Diagnosis not present

## 2020-04-23 DIAGNOSIS — Z794 Long term (current) use of insulin: Secondary | ICD-10-CM

## 2020-04-23 NOTE — Progress Notes (Signed)
Subjective:    Patient ID: Jose Schroeder, male    DOB: Sep 13, 1932, 86 y.o.   MRN: [id number] Chief Complaint  Patient presents with  . Follow-up    ultrasound    The patient for follow-up evaluation of left lower extremity pain.  The patient does have a previous history of peripheral arterial disease and has a right below-knee amputation.  Initially the patient noted pain with ambulation however today he notes that he does not have any pain with walking only in his heel sometimes.  He notes that the pain is a sharp shooting pain that typically happens at night.  He also notes that this pain is not consistent day today its more sporadic than anything.  He denies any wounds of his lower extremities.  He denies any fever, chills, nausea, vomiting or diarrhea.  He denies any difficulty with ambulation that is not attributed to the discomfort in his stomach.  That should be rectified soon as he has been fitted for a new prosthesis.  The patient's daughter is also present to help with some history she correlates most of what the patient is saying.  Today the patient had ABIs done which showed an ABI of 1.10 on the left with a TBI 0.74.  The patient also underwent a left lower extremity arterial duplex which shows biphasic waveforms throughout the left lower extremity with monophasic waveforms at the distal anterior tibial and posterior tibial arteries.  There are collaterals and monophasic flow seen also at the distal anterior tibial and posterior tibial artery.  The peroneal artery has biphasic flow present.  The right lower extremity has biphasic waveforms noted in the mid common femoral artery as well as the deep femoral artery.   Review of Systems  Neurological: Positive for weakness.  All other systems reviewed and are negative.      Objective:   Physical Exam Vitals reviewed.  HENT:     Head: Normocephalic.  Cardiovascular:     Rate and Rhythm: Normal rate.     Pulses: Normal  pulses.  Pulmonary:     Effort: Pulmonary effort is normal.  Musculoskeletal:     Right Lower Extremity: Right leg is amputated below knee.  Neurological:     Mental Status: He is alert and oriented to person, place, and time.     Motor: Weakness present.     Gait: Gait abnormal.  Psychiatric:        Mood and Affect: Mood normal.        Behavior: Behavior normal.        Thought Content: Thought content normal.        Judgment: Judgment normal.     BP (!) 168/69 (BP Location: Right Arm)   Pulse 80   Resp 16   Ht 6' (1.829 m)   Wt 156 lb (70.8 kg)   BMI 21.16 kg/m   Past Medical History:  Diagnosis Date  . Diabetes mellitus without complication (HCC)   . Hypertension   . Peripheral vascular disease (HCC)     Social History   Socioeconomic History  . Marital status: Married    Spouse name: Not on file  . Number of children: 4  . Years of education: Not on file  . Highest education level: 8th grade  Occupational History  . Occupation: retired  Tobacco Use  . Smoking status: Never Smoker  . Smokeless tobacco: Never Used  Vaping Use  . Vaping Use: Never used  Substance and Sexual  Activity  . Alcohol use: No    Alcohol/week: 0.0 standard drinks  . Drug use: No  . Sexual activity: Not Currently  Other Topics Concern  . Not on file  Social History Narrative  . Not on file   Social Determinants of Health   Financial Resource Strain: High Risk  . Difficulty of Paying Living Expenses: Hard  Food Insecurity: No Food Insecurity  . Worried About Programme researcher, broadcasting/film/video in the Last Year: Never true  . Ran Out of Food in the Last Year: Never true  Transportation Needs: No Transportation Needs  . Lack of Transportation (Medical): No  . Lack of Transportation (Non-Medical): No  Physical Activity: Inactive  . Days of Exercise per Week: 0 days  . Minutes of Exercise per Session: 0 min  Stress: Not on file  Social Connections: Moderately Isolated  . Frequency of  Communication with Friends and Family: More than three times a week  . Frequency of Social Gatherings with Friends and Family: More than three times a week  . Attends Religious Services: Never  . Active Member of Clubs or Organizations: No  . Attends Banker Meetings: Never  . Marital Status: Married  Catering manager Violence: Not At Risk  . Fear of Current or Ex-Partner: No  . Emotionally Abused: No  . Physically Abused: No  . Sexually Abused: No    Past Surgical History:  Procedure Laterality Date  . AMPUTATION TOE Right 01/15/2016   Procedure: AMPUTATION TOE;  Surgeon: Gwyneth Revels, DPM;  Location: [facility] ORS;  Service: Podiatry;  Laterality: Right;  . PERIPHERAL VASCULAR CATHETERIZATION Right 01/07/2016   Procedure: Abdominal Aortogram w/Lower Extremity;  Surgeon: Renford Dills, MD;  Location: [facility] INV[facility]IVE [facility];  Service: Cardiovascular;  Laterality: Right;  . TOE SURGERY     amputated 2 toes on L) foot    Family History  Problem Relation Age of Onset  . Diabetes Mother   . Hypertension Mother   . Diabetes Father   . Hypertension Father   . Diabetes Daughter     No Known Allergies  CBC Latest Ref Rng & Units 10/01/2019 05/17/2019 09/13/2018  WBC 3.4 - 10.8 x10E3/uL - 6.2 -  Hemoglobin 13.0 - 17.7 g/dL 11.4(L) 11.2(L) 12.1(L)  Hematocrit 37.5 - 51.0 % - 33.2(L) -  Platelets 150 - 450 x10E3/uL - 260 -      CMP     Component Value Date/Time   NA 140 10/01/2019 1550   NA 141 11/12/2012 0717   K 4.4 10/01/2019 1550   K 4.0 11/12/2012 0717   CL 101 10/01/2019 1550   CL 106 11/12/2012 0717   CO2 24 10/01/2019 1550   CO2 31 11/12/2012 0717   GLUCOSE 180 (H) 10/01/2019 1550   GLUCOSE 138 (H) 01/06/2016 0432   GLUCOSE 149 (H) 11/12/2012 0717   BUN 13 10/01/2019 1550   BUN 10 11/12/2012 0717   CREATININE 0.90 10/01/2019 1550   CREATININE 0.99 11/12/2012 0717   CALCIUM 10.0 10/01/2019 1550   CALCIUM 9.4 11/12/2012 0717   PROT 7.5 05/17/2019 1412    ALBUMIN 4.7 (H) 10/01/2019 1550   AST 17 05/17/2019 1412   ALT 7 05/17/2019 1412   ALKPHOS 53 05/17/2019 1412   BILITOT 0.3 05/17/2019 1412   GFRNONAA 78 10/01/2019 1550   GFRNONAA >60 11/12/2012 0717   GFRAA 90 10/01/2019 1550   GFRAA >60 11/12/2012 0717     No results found.     Assessment &  Plan:   1. Atherosclerosis of native artery of left lower extremity with intermittent claudication (HCC)  Recommend:  The patient has evidence of atherosclerosis of the lower extremities with claudication.  The patient does not voice lifestyle limiting changes at this point in time.  Noninvasive studies do not suggest clinically significant change.  No invasive studies, angiography or surgery at this time The patient should continue walking and begin a more formal exercise program.  The patient should continue antiplatelet therapy and aggressive treatment of the lipid abnormalities  No changes in the patient's medications at this time  The patient should continue wearing graduated compression socks 10-15 mmHg strength to control the mild edema.   The patient will return in 1 year for noninvasive studies or sooner if changes should arise. 2. BKA stump complication (HCC) The patient has been fitted with a new prosthesis and it should be arriving soon.  This should assist with complication as previously discussed.  3. Familial hypercholesterolemia Continue statin as ordered and reviewed, no changes at this time   4. Essential hypertension Continue antihypertensive medications as already ordered, these medications have been reviewed and there are no changes at this time.   5. Type 2 diabetes mellitus with diabetic peripheral angiopathy and gangrene, with long-term current use of insulin (HCC) Continue hypoglycemic medications as already ordered, these medications have been reviewed and there are no changes at this time.  Hgb A1C to be monitored as already arranged by primary  service    Current Outpatient Medications on File Prior to Visit  Medication Sig Dispense Refill  . amLODipine (NORVASC) 10 MG tablet Take 1 tablet (10 mg total) by mouth daily. 90 tablet 1  . aspirin EC 81 MG tablet Take 1 tablet (81 mg total) by mouth daily. 30 tablet 2  . atorvastatin (LIPITOR) 20 MG tablet Take 1 tablet (20 mg total) by mouth daily. 90 tablet 3  . ferrous sulfate 325 (65 FE) MG tablet Take 1 tablet (325 mg total) by mouth daily with breakfast. 90 tablet 1  . gabapentin (NEURONTIN) 100 MG capsule Take 1 capsule (100 mg total) by mouth 3 (three) times daily. 90 capsule 1  . glipiZIDE (GLUCOTROL XL) 10 MG 24 hr tablet Take 1 tablet (10 mg total) by mouth daily with breakfast. 90 tablet 1  . hydrochlorothiazide (HYDRODIURIL) 25 MG tablet Take 1 tablet (25 mg total) by mouth daily. 90 tablet 1  . lisinopril (ZESTRIL) 10 MG tablet Take 1 tablet (10 mg total) by mouth daily. 90 tablet 1  . metFORMIN (GLUCOPHAGE) 500 MG tablet Take 1 tablet (500 mg total) by mouth 2 (two) times daily. 180 tablet 1  . metoprolol tartrate (LOPRESSOR) 50 MG tablet Take 1 tablet (50 mg total) by mouth 2 (two) times daily. 180 tablet 1  . ONETOUCH ULTRA test strip USE AS DIRECTED BY  PHYSICIAN 200 strip 5  . sitaGLIPtin (JANUVIA) 100 MG tablet Take 1 tablet (100 mg total) by mouth daily. 90 tablet 1   No current facility-administered medications on file prior to visit.    There are no Patient Instructions on file for this visit. No follow-ups on file.   Georgiana Spinner, NP

## 2020-05-20 ENCOUNTER — Telehealth: Payer: Self-pay

## 2020-05-20 NOTE — Telephone Encounter (Unsigned)
Copied from CRM [id number]. Topic: General - Other >> May 20, 2020  2:59 PM Gwenlyn Fudge wrote: Reason for CRM: Pts wife called and is requesting to have PCP give her a call back to discuss pts prosthetic leg. She states that the insurance is giving them a hard time about paying for it. Please advise.

## 2020-05-21 NOTE — Telephone Encounter (Signed)
Spoke to both prosthetic companies- gave wife number to [facility]- she is to call and schedule a face to face with Dr Gilda Crease to be able to turn into insurance to cover the new prosthetic leg

## 2020-05-23 ENCOUNTER — Other Ambulatory Visit: Payer: Self-pay | Admitting: Family Medicine

## 2020-05-23 DIAGNOSIS — I10 Essential (primary) hypertension: Secondary | ICD-10-CM

## 2020-05-23 NOTE — Telephone Encounter (Signed)
Requested Prescriptions  Pending Prescriptions Disp Refills  . lisinopril (ZESTRIL) 5 MG tablet [Pharmacy Med Name: Lisinopril 5 MG Oral Tablet] 90 tablet     Sig: TAKE 1 TABLET BY MOUTH  DAILY     Cardiovascular:  ACE Inhibitors Failed - 05/23/2020  4:09 AM      Failed - Cr in normal range and within 180 days    Creatinine  Date Value Ref Range Status  11/12/2012 0.99 0.60 - 1.30 mg/dL Final   Creatinine, Ser  Date Value Ref Range Status  10/01/2019 0.90 0.76 - 1.27 mg/dL Final         Failed - K in normal range and within 180 days    Potassium  Date Value Ref Range Status  10/01/2019 4.4 3.5 - 5.2 mmol/L Final  11/12/2012 4.0 3.5 - 5.1 mmol/L Final         Failed - Last BP in normal range    BP Readings from Last 1 Encounters:  04/23/20 (!) 168/69         Passed - Patient is not pregnant      Passed - Valid encounter within last 6 months    Recent Outpatient Visits          1 month ago Type 2 diabetes mellitus with other circulatory complication, without long-term current use of insulin (HCC)   [facility] Duanne Limerick, MD   3 months ago Type 2 diabetes mellitus with other circulatory complication, without long-term current use of insulin (HCC)   [facility] Duanne Limerick, MD   5 months ago Amputated below knee, right (HCC)   [facility] Duanne Limerick, MD   7 months ago Type 2 diabetes mellitus with other circulatory complication, without long-term current use of insulin (HCC)   [facility] Duanne Limerick, MD   1 year ago Type 2 diabetes mellitus with other circulatory complication, without long-term current use of insulin (HCC)   [facility] Duanne Limerick, MD      Future Appointments            In 2 days Duanne Limerick, MD [facility]           . amLODipine (NORVASC) 10 MG tablet [Pharmacy Med Name: amLODIPine Besylate 10 MG Oral Tablet] 90 tablet 2    Sig: TAKE 1 TABLET BY MOUTH   DAILY     Cardiovascular:  Calcium Channel Blockers Failed - 05/23/2020  4:09 AM      Failed - Last BP in normal range    BP Readings from Last 1 Encounters:  04/23/20 (!) 168/69         Passed - Valid encounter within last 6 months    Recent Outpatient Visits          1 month ago Type 2 diabetes mellitus with other circulatory complication, without long-term current use of insulin (HCC)   [facility] Duanne Limerick, MD   3 months ago Type 2 diabetes mellitus with other circulatory complication, without long-term current use of insulin (HCC)   [facility] Duanne Limerick, MD   5 months ago Amputated below knee, right [facility])   [facility] Duanne Limerick, MD   7 months ago Type 2 diabetes mellitus with other circulatory complication, without long-term current use of insulin [facility])   [facility] Duanne Limerick, MD   1 year ago Type 2 diabetes  mellitus with other circulatory complication, without long-term current use of insulin [facility])   [facility] Duanne Limerick, MD      Future Appointments            In 2 days Duanne Limerick, MD [facility] [address]           '

## 2020-05-25 ENCOUNTER — Encounter: Payer: Self-pay | Admitting: Family Medicine

## 2020-05-25 ENCOUNTER — Ambulatory Visit (INDEPENDENT_AMBULATORY_CARE_PROVIDER_SITE_OTHER): Payer: Medicare Other | Admitting: Family Medicine

## 2020-05-25 ENCOUNTER — Other Ambulatory Visit: Payer: Self-pay

## 2020-05-25 VITALS — BP 134/62 | HR 76 | Ht 72.0 in | Wt 156.0 lb

## 2020-05-25 DIAGNOSIS — S88111A Complete traumatic amputation at level between knee and ankle, right lower leg, initial encounter: Secondary | ICD-10-CM | POA: Diagnosis not present

## 2020-05-25 DIAGNOSIS — E1159 Type 2 diabetes mellitus with other circulatory complications: Secondary | ICD-10-CM

## 2020-05-25 DIAGNOSIS — E7801 Familial hypercholesterolemia: Secondary | ICD-10-CM

## 2020-05-25 NOTE — Progress Notes (Signed)
Date:  05/25/2020   Name:  Jose Schroeder   DOB:  08/16/1932   MRN:  [id number]   Chief Complaint: Diabetes (Follow up- recheck on A1C)  Diabetes He presents for his follow-up diabetic visit. He has type 2 diabetes mellitus. His disease course has been fluctuating. There are no hypoglycemic associated symptoms. There are no diabetic associated symptoms. Pertinent negatives for diabetes include no chest pain. There are no hypoglycemic complications. Symptoms are stable. There are no diabetic complications. Risk factors for coronary artery disease include diabetes mellitus, dyslipidemia and hypertension. Current diabetic treatment includes oral agent (triple therapy) (glip/met/jan). He is compliant with treatment some of the time (the nightly banana sandwich). Meal planning includes avoidance of concentrated sweets and carbohydrate counting. His home blood glucose trend is fluctuating dramatically. An ACE inhibitor/angiotensin II receptor blocker is being taken.  Hyperlipidemia This is a chronic problem. The current episode started more than 1 year ago. The problem is controlled. Recent lipid tests were reviewed and are normal. He has no history of chronic renal disease, diabetes, hypothyroidism, liver disease, obesity or nephrotic syndrome. Pertinent negatives include no chest pain, focal sensory loss, focal weakness, leg pain, myalgias or shortness of breath. Current antihyperlipidemic treatment includes statins.    Lab Results  Component Value Date   CREATININE 0.90 10/01/2019   BUN 13 10/01/2019   NA 140 10/01/2019   K 4.4 10/01/2019   CL 101 10/01/2019   CO2 24 10/01/2019   Lab Results  Component Value Date   CHOL 191 01/31/2020   HDL 53 01/31/2020   LDLCALC 110 (H) 01/31/2020   TRIG 162 (H) 01/31/2020   CHOLHDL 3.0 12/15/2016   No results found for: TSH Lab Results  Component Value Date   HGBA1C 6.9 (H) 01/31/2020   Lab Results  Component Value Date   WBC 6.2  05/17/2019   HGB 11.4 (L) 10/01/2019   HCT 33.2 (L) 05/17/2019   MCV 87 05/17/2019   PLT 260 05/17/2019   Lab Results  Component Value Date   ALT 7 05/17/2019   AST 17 05/17/2019   ALKPHOS 53 05/17/2019   BILITOT 0.3 05/17/2019     Review of Systems  Respiratory: Negative for shortness of breath.   Cardiovascular: Negative for chest pain.  Musculoskeletal: Negative for myalgias.  Neurological: Negative for focal weakness.    Patient Active Problem List   Diagnosis Date Noted  . Atherosclerosis of native arteries of extremity with intermittent claudication ([place]) 03/30/2020  . BKA stump complication ([place]) 25/00/3704  . Absolute anemia 10/01/2019  . Familial hypercholesterolemia 10/01/2019  . Acquired absence of right leg below knee ([place]) 09/13/2018  . Amputated below knee, right ([place]) 05/09/2017  . PVD (peripheral vascular disease) ([place]) 12/15/2016  . Hx of BKA, left ([place]) 02/25/2016  . Ulcer of right foot ([place]) 01/31/2016  . Amputated great toe ([place]) 01/11/2016  . Toe infection 01/04/2016  . Diabetes mellitus ([place]) 09/24/2015  . Essential hypertension 09/24/2015    No Known Allergies  Past Surgical History:  Procedure Laterality Date  . AMPUTATION TOE Right 01/15/2016   Procedure: AMPUTATION TOE;  Surgeon: Samara Deist, DPM;  Location: [facility];  Service: Podiatry;  Laterality: Right;  . PERIPHERAL VASCULAR CATHETERIZATION Right 01/07/2016   Procedure: Abdominal Aortogram w/Lower Extremity;  Surgeon: Katha Cabal, MD;  Location: [place] [facility];  Service: Cardiovascular;  Laterality: Right;  . TOE SURGERY     amputated 2 toes on L) foot  Social History   Tobacco Use  . Smoking status: Never Smoker  . Smokeless tobacco: Never Used  Vaping Use  . Vaping Use: Never used  Substance Use Topics  . Alcohol use: No    Alcohol/week: 0.0 standard drinks  . Drug use: No     Medication list has been reviewed and updated.  Current Meds  Medication Sig   . amLODipine (NORVASC) 10 MG tablet TAKE 1 TABLET BY MOUTH  DAILY  . aspirin EC 81 MG tablet Take 1 tablet (81 mg total) by mouth daily.  Marland Kitchen atorvastatin (LIPITOR) 20 MG tablet Take 1 tablet (20 mg total) by mouth daily.  . ferrous sulfate 325 (65 FE) MG tablet Take 1 tablet (325 mg total) by mouth daily with breakfast.  . gabapentin (NEURONTIN) 100 MG capsule Take 1 capsule (100 mg total) by mouth 3 (three) times daily.  Marland Kitchen glipiZIDE (GLUCOTROL XL) 10 MG 24 hr tablet Take 1 tablet (10 mg total) by mouth daily with breakfast.  . hydrochlorothiazide (HYDRODIURIL) 25 MG tablet Take 1 tablet (25 mg total) by mouth daily.  Marland Kitchen lisinopril (ZESTRIL) 10 MG tablet Take 1 tablet (10 mg total) by mouth daily.  . metFORMIN (GLUCOPHAGE) 500 MG tablet Take 1 tablet (500 mg total) by mouth 2 (two) times daily.  . metoprolol tartrate (LOPRESSOR) 50 MG tablet Take 1 tablet (50 mg total) by mouth 2 (two) times daily.  Glory Rosebush ULTRA test strip USE AS DIRECTED BY  PHYSICIAN  . sitaGLIPtin (JANUVIA) 100 MG tablet Take 1 tablet (100 mg total) by mouth daily.    PHQ 2/9 Scores 12/05/2019 10/01/2019 09/30/2019 01/14/2019  PHQ - 2 Score 0 0 0 0  PHQ- 9 Score 0 0 - 0    GAD 7 : Generalized Anxiety Score 12/05/2019 10/01/2019  Nervous, Anxious, on Edge 0 0  Control/stop worrying 0 0  Worry too much - different things 0 0  Trouble relaxing 0 0  Restless 0 0  Easily annoyed or irritable 0 0  Afraid - awful might happen 0 0  Total GAD 7 Score 0 0  Anxiety Difficulty Not difficult at all -    BP Readings from Last 3 Encounters:  05/25/20 134/62  04/23/20 (!) 168/69  04/13/20 (!) 130/98    Physical Exam  Wt Readings from Last 3 Encounters:  05/25/20 156 lb (70.8 kg)  04/23/20 156 lb (70.8 kg)  03/30/20 154 lb (69.9 kg)    BP 134/62   Pulse 76   Ht 6' (1.829 m)   Wt 156 lb (70.8 kg)   BMI 21.16 kg/m   Assessment and Plan: 1. Type 2 diabetes mellitus with other circulatory complication, without long-term  current use of insulin (HCC) Chronic.  Questionable control.  Stable.  Patient has had significant fluctuation in his fasting in the low 100s to lower 200s depending what he eats before he goes to bed at night.  We will check an A1c to see if he is still at 6.9 before making any adjustments on his medication.  Patient's been instructed not to consume banana sandwiches prior to bedtime and that bright bananas are significant glycemic load. - Hemoglobin A1c  2. Familial hypercholesterolemia Chronic.  Controlled.  Stable.  Patient had elevated lipids on the last visit we will check a lipid panel today with a relatively light lunch and light breakfast prior.  We will obtain an LDL for evaluation.. - Lipid Panel With LDL/HDL Ratio  3. Amputated below knee, right ([place])  Patient has an upcoming appointment with vein and vascular for 1 on 1 encounter because he needs an entirely new prosthetic leg and this is necessary prior to obtaining this needed prosthesis.  Needs in

## 2020-05-25 NOTE — Patient Instructions (Signed)

## 2020-05-26 LAB — LIPID PANEL WITH LDL/HDL RATIO
Cholesterol, Total: 124 mg/dL (ref 100–199)
HDL: 39 mg/dL — ABNORMAL LOW (ref >39)
LDL Chol Calc (NIH): 66 mg/dL (ref 0–99)
LDL/HDL Ratio: 1.7 ratio (ref 0.0–3.6)
Triglycerides: 104 mg/dL (ref 0–149)
VLDL Cholesterol Cal: 19 mg/dL (ref 5–40)

## 2020-05-26 LAB — HEMOGLOBIN A1C
Est. average glucose Bld gHb Est-mCnc: 166 mg/dL
Hgb A1c MFr Bld: 7.4 % — ABNORMAL HIGH (ref 4.8–5.6)

## 2020-05-28 DIAGNOSIS — H26491 Other secondary cataract, right eye: Secondary | ICD-10-CM | POA: Diagnosis not present

## 2020-05-28 DIAGNOSIS — E113591 Type 2 diabetes mellitus with proliferative diabetic retinopathy without macular edema, right eye: Secondary | ICD-10-CM | POA: Diagnosis not present

## 2020-06-04 ENCOUNTER — Other Ambulatory Visit: Payer: Self-pay

## 2020-06-04 ENCOUNTER — Encounter (INDEPENDENT_AMBULATORY_CARE_PROVIDER_SITE_OTHER): Payer: Self-pay | Admitting: Vascular Surgery

## 2020-06-04 ENCOUNTER — Ambulatory Visit (INDEPENDENT_AMBULATORY_CARE_PROVIDER_SITE_OTHER): Payer: Medicare Other | Admitting: Vascular Surgery

## 2020-06-04 ENCOUNTER — Ambulatory Visit: Payer: Medicare Other | Admitting: Family Medicine

## 2020-06-04 VITALS — BP 176/79 | HR 86 | Resp 16 | Wt 155.4 lb

## 2020-06-04 DIAGNOSIS — I10 Essential (primary) hypertension: Secondary | ICD-10-CM

## 2020-06-04 DIAGNOSIS — I70212 Atherosclerosis of native arteries of extremities with intermittent claudication, left leg: Secondary | ICD-10-CM

## 2020-06-04 DIAGNOSIS — Z794 Long term (current) use of insulin: Secondary | ICD-10-CM

## 2020-06-04 DIAGNOSIS — E1152 Type 2 diabetes mellitus with diabetic peripheral angiopathy with gangrene: Secondary | ICD-10-CM | POA: Diagnosis not present

## 2020-06-04 DIAGNOSIS — S88111A Complete traumatic amputation at level between knee and ankle, right lower leg, initial encounter: Secondary | ICD-10-CM | POA: Diagnosis not present

## 2020-06-04 DIAGNOSIS — E7801 Familial hypercholesterolemia: Secondary | ICD-10-CM

## 2020-06-04 NOTE — Progress Notes (Signed)
MRN : [id number]  Jose Schroeder is a 87 y.o. (1932/06/05) male who presents with chief complaint of No chief complaint on file. Marland Kitchen  History of Present Illness:   The patient was last seen in 03/2019.   The patient is seen today for evaluation of painful lower extremities and diminished pulses on the left.  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  He is having pain at the end of his stump when he wears his prosthesis.    He has not been able to ambulate because his prosthesis is not fitting at all.  The patient denies rest pain or dangling of an extremity off the side of the bed during the night for relief. No open wounds or sores at this time. No prior interventions or surgeries.  No history of back problems or DJD of the lumbar sacral spine.   The patient denies changes in claudication symptoms or new rest pain symptoms.  No new ulcers or wounds of the foot.  No outpatient medications have been marked as taking for the 06/04/20 encounter (Appointment) with Gilda Crease, Latina Craver, MD.    Past Medical History:  Diagnosis Date  . Diabetes mellitus without complication (HCC)   . Hypertension   . Peripheral vascular disease [facility])     Past Surgical History:  Procedure Laterality Date  . AMPUTATION TOE Right 01/15/2016   Procedure: AMPUTATION TOE;  Surgeon: Gwyneth Revels, DPM;  Location: [facility] OR[facility];  Service: Podiatry;  Laterality: Right;  . PERIPHERAL VASCULAR CATHETERIZATION Right 01/07/2016   Procedure: Abdominal Aortogram w/Lower Extremity;  Surgeon: Renford Dills, MD;  Location: [facility] IN[facility];  Service: Cardiovascular;  Laterality: Right;  . TOE SURGERY     amputated 2 toes on L) foot    Social History Social History   Tobacco Use  . Smoking status: Never Smoker  . Smokeless tobacco: Never Used  Vaping Use  . Vaping Use: Never used  Substance Use Topics  . Alcohol use: No    Alcohol/week: 0.0 standard drinks  . Drug use: No    Family  History Family History  Problem Relation Age of Onset  . Diabetes Mother   . Hypertension Mother   . Diabetes Father   . Hypertension Father   . Diabetes Daughter     No Known Allergies   REVIEW OF SYSTEMS (Negative unless checked)  Constitutional: [] Weight loss  [] Fever  [] Chills Cardiac: [] Chest pain   [] Chest pressure   [] Palpitations   [] Shortness of breath when laying flat   [] Shortness of breath with exertion. Vascular:  [] Pain in legs with walking   [] Pain in legs at rest  [] History of DVT   [] Phlebitis   [] Swelling in legs   [] Varicose veins   [] Non-healing ulcers Pulmonary:   [] Uses home oxygen   [] Productive cough   [] Hemoptysis   [] Wheeze  [] COPD   [] Asthma Neurologic:  [] Dizziness   [] Seizures   [] History of stroke   [] History of TIA  [] Aphasia   [] Vissual changes   [] Weakness or numbness in arm   [] Weakness or numbness in leg Musculoskeletal:   [] Joint swelling   [] Joint pain   [] Low back pain Hematologic:  [] Easy bruising  [] Easy bleeding   [] Hypercoagulable state   [] Anemic Gastrointestinal:  [] Diarrhea   [] Vomiting  [] Gastroesophageal reflux/heartburn   [] Difficulty swallowing. Genitourinary:  [] Chronic kidney disease   [] Difficult urination  [] Frequent urination   [] Blood in urine Skin:  [] Rashes   [] Ulcers  Psychological:  [] History of anxiety   []  History of major depression.  Physical Examination  There were no vitals filed for this visit. There is no height or weight on file to calculate BMI. Gen: WD/WN, NAD Head: [place]/AT, No temporalis wasting.  Ear/Nose/Throat: Hearing grossly intact, nares w/o erythema or drainage Eyes: PER, EOMI, sclera nonicteric.  Neck: Supple, no large masses.   Pulmonary:  Good air movement, no audible wheezing bilaterally, no use of accessory muscles.  Cardiac: RRR, no JVD Vascular: Raw spot with an abrasion of the stump tender to touch Vessel Right Left  Radial Palpable Palpable  PT BKA Palpable  DP BKA Palpable   Gastrointestinal: Non-distended. No guarding/no peritoneal signs.  Musculoskeletal: M/S 5/5 throughout.  No deformity or atrophy.  Neurologic: CN 2-12 intact. Symmetrical.  Speech is fluent. Motor exam as listed above. Psychiatric: Judgment intact, Mood & affect appropriate for pt's clinical situation. Dermatologic: No rashes or ulcers noted.  No changes consistent with cellulitis. Lymph : No lichenification or skin changes of chronic lymphedema.  CBC Lab Results  Component Value Date   WBC 6.2 05/17/2019   HGB 11.4 (L) 10/01/2019   HCT 33.2 (L) 05/17/2019   MCV 87 05/17/2019   PLT 260 05/17/2019    BMET    Component Value Date/Time   NA 140 10/01/2019 1550   NA 141 11/12/2012 0717   K 4.4 10/01/2019 1550   K 4.0 11/12/2012 0717   CL 101 10/01/2019 1550   CL 106 11/12/2012 0717   CO2 24 10/01/2019 1550   CO2 31 11/12/2012 0717   GLUCOSE 180 (H) 10/01/2019 1550   GLUCOSE 138 (H) 01/06/2016 0432   GLUCOSE 149 (H) 11/12/2012 0717   BUN 13 10/01/2019 1550   BUN 10 11/12/2012 0717   CREATININE 0.90 10/01/2019 1550   CREATININE 0.99 11/12/2012 0717   CALCIUM 10.0 10/01/2019 1550   CALCIUM 9.4 11/12/2012 0717   GFRNONAA 78 10/01/2019 1550   GFRNONAA >60 11/12/2012 0717   GFRAA 90 10/01/2019 1550   GFRAA >60 11/12/2012 0717   CrCl cannot be calculated (Patient's most recent lab result is older than the maximum 21 days allowed.).  COAG Lab Results  Component Value Date   INR 1.05 01/04/2016    Radiology No results found.   Assessment/Plan 1. Atherosclerosis of native artery of left lower extremity with intermittent claudication (HCC)  Recommend:  The patient has evidence of atherosclerosis of the lower extremities with claudication.   He is s/p right BKA but is now having severe problems with ambulating because he has had significant volume loss of his stump.  He is now wearing 15 + ply of socks which is not working.  His current prosthesis in not functional and  can't be fixed. The patient and he needs a new K2 ambulatory.  Noninvasive studies are over do and will be ordered.  No invasive studies, angiography or surgery at this time The patient should continue walking and begin a more formal exercise program.  The patient should continue antiplatelet therapy and aggressive treatment of the lipid abnormalities  No changes in the patient's medications at this time  - VAS 11/14/2012 LOWER EXTREMITY ARTERIAL DUPLEX; Future - VAS 03/05/2016 ABI WITH/WO TBI; Future  2. BKA stump complication (HCC) I have been in contact with [employer] and given the patient a Rx for modifying his existing prosthesis  3. Essential hypertension Continue antihypertensive medications as already ordered, these medications have been reviewed and there are no changes at this  time.   4. Type 2 diabetes mellitus with diabetic peripheral angiopathy and gangrene, with long-term current use of insulin (HCC) Continue hypoglycemic medications as already ordered, these medications have been reviewed and there are no changes at this time.  Hgb A1C to be monitored as already arranged by primary service   5. Familial hypercholesterolemia Continue statin as ordered and reviewed, no changes at this time   [name], MD  06/04/2020 12:43 PM

## 2020-06-28 ENCOUNTER — Encounter (INDEPENDENT_AMBULATORY_CARE_PROVIDER_SITE_OTHER): Payer: Self-pay | Admitting: Vascular Surgery

## 2020-08-24 ENCOUNTER — Other Ambulatory Visit: Payer: Self-pay | Admitting: Family Medicine

## 2020-08-24 DIAGNOSIS — E0849 Diabetes mellitus due to underlying condition with other diabetic neurological complication: Secondary | ICD-10-CM

## 2020-08-24 DIAGNOSIS — E1159 Type 2 diabetes mellitus with other circulatory complications: Secondary | ICD-10-CM

## 2020-08-24 DIAGNOSIS — I10 Essential (primary) hypertension: Secondary | ICD-10-CM

## 2020-08-24 DIAGNOSIS — E7801 Familial hypercholesterolemia: Secondary | ICD-10-CM

## 2020-08-24 MED ORDER — GABAPENTIN 100 MG PO CAPS: 100.0000 mg | ORAL_CAPSULE | Freq: Three times a day (TID) | ORAL | 0 refills | Status: DC

## 2020-08-24 MED ORDER — SITAGLIPTIN PHOSPHATE 100 MG PO TABS: 100.0000 mg | ORAL_TABLET | Freq: Every day | ORAL | 1 refills | Status: DC

## 2020-08-24 MED ORDER — HYDROCHLOROTHIAZIDE 25 MG PO TABS: 25.0000 mg | ORAL_TABLET | Freq: Every day | ORAL | 0 refills | Status: DC

## 2020-08-24 MED ORDER — METOPROLOL TARTRATE 50 MG PO TABS: 50.0000 mg | ORAL_TABLET | Freq: Two times a day (BID) | ORAL | 0 refills | Status: DC

## 2020-08-24 MED ORDER — LISINOPRIL 10 MG PO TABS: 10.0000 mg | ORAL_TABLET | Freq: Every day | ORAL | 0 refills | Status: DC

## 2020-08-24 MED ORDER — METFORMIN HCL 500 MG PO TABS: 500.0000 mg | ORAL_TABLET | Freq: Two times a day (BID) | ORAL | 0 refills | Status: DC

## 2020-08-24 MED ORDER — ATORVASTATIN CALCIUM 20 MG PO TABS: 20.0000 mg | ORAL_TABLET | Freq: Every day | ORAL | 2 refills | Status: DC

## 2020-08-24 NOTE — Telephone Encounter (Signed)
Change in pharmacy to mail order- Rx RF per protocol- upcoming appointment 5/17.

## 2020-08-24 NOTE — Telephone Encounter (Signed)
Medication Refill - Medication:   gabapentin (NEURONTIN) 100 MG capsule   hydrochlorothiazide (HYDRODIURIL) 25 MG tablet  lisinopril (ZESTRIL) 10 MG tablet   sitaGLIPtin (JANUVIA) 100 MG tablet   atorvastatin (LIPITOR) 20 MG tablet   metFORMIN (GLUCOPHAGE) 500 MG tablet   metoprolol tartrate (LOPRESSOR) 50 MG tablet      Has the patient contacted their pharmacy? Yes.  no refills left, contact office.    Preferred Pharmacy (with phone number or street name):   [facility] - [place], [place] - [address] [place], [address]  [address] [place], [address] [place]  [address]  Phone: [phone] Fax: [phone]    Agent: Please be advised that RX refills may take up to 3 business days. We ask that you follow-up with your pharmacy.

## 2020-09-03 ENCOUNTER — Ambulatory Visit (INDEPENDENT_AMBULATORY_CARE_PROVIDER_SITE_OTHER): Payer: Medicare Other | Admitting: Vascular Surgery

## 2020-09-03 ENCOUNTER — Encounter (INDEPENDENT_AMBULATORY_CARE_PROVIDER_SITE_OTHER): Payer: Self-pay | Admitting: Vascular Surgery

## 2020-09-03 ENCOUNTER — Other Ambulatory Visit: Payer: Self-pay

## 2020-09-03 VITALS — BP 166/57 | HR 76 | Ht 74.0 in

## 2020-09-03 DIAGNOSIS — T879 Unspecified complications of amputation stump: Secondary | ICD-10-CM

## 2020-09-03 DIAGNOSIS — I10 Essential (primary) hypertension: Secondary | ICD-10-CM | POA: Diagnosis not present

## 2020-09-03 DIAGNOSIS — E1152 Type 2 diabetes mellitus with diabetic peripheral angiopathy with gangrene: Secondary | ICD-10-CM

## 2020-09-03 DIAGNOSIS — I70212 Atherosclerosis of native arteries of extremities with intermittent claudication, left leg: Secondary | ICD-10-CM

## 2020-09-03 DIAGNOSIS — Z794 Long term (current) use of insulin: Secondary | ICD-10-CM | POA: Diagnosis not present

## 2020-09-03 DIAGNOSIS — E7801 Familial hypercholesterolemia: Secondary | ICD-10-CM

## 2020-09-03 NOTE — Progress Notes (Signed)
MRN : [id number]  Jose Schroeder is a 87 y.o. (Apr 30, 1932) male who presents with chief complaint of No chief complaint on file. Marland Kitchen  History of Present Illness:   The patient is seen todayfor evaluation of diminished pulses on the left.He is now s/p right BKA at an outside institution. Today he denies pain at the end of his stump when he wears his prosthesis.   He has been able to ambulate because his prosthesis.  The patient denies rest pain or dangling of an extremity off the side of the bed during the night for relief. No open wounds or sores at this time. No prior interventions or surgeries.  No history of back problems or DJD of the lumbar sacral spine.   The patient denies changes in claudication symptoms or new rest pain symptoms. No new ulcers or wounds of the foot.  No outpatient medications have been marked as taking for the 09/03/20 encounter (Appointment) with Gilda Crease, Latina Craver, MD.    Past Medical History:  Diagnosis Date  . Diabetes mellitus without complication (HCC)   . Hypertension   . Peripheral vascular disease [facility])     Past Surgical History:  Procedure Laterality Date  . AMPUTATION TOE Right 01/15/2016   Procedure: AMPUTATION TOE;  Surgeon: Gwyneth Revels, DPM;  Location: [facility] ORS;  Service: Podiatry;  Laterality: Right;  . PERIPHERAL VASCULAR CATHETERIZATION Right 01/07/2016   Procedure: Abdominal Aortogram w/Lower Extremity;  Surgeon: Renford Dills, MD;  Location: [facility] INV[facility];  Service: Cardiovascular;  Laterality: Right;  . TOE SURGERY     amputated 2 toes on L) foot    Social History Social History   Tobacco Use  . Smoking status: Never Smoker  . Smokeless tobacco: Never Used  Vaping Use  . Vaping Use: Never used  Substance Use Topics  . Alcohol use: No    Alcohol/week: 0.0 standard drinks  . Drug use: No    Family History Family History  Problem Relation Age of Onset  . Diabetes Mother   . Hypertension  Mother   . Diabetes Father   . Hypertension Father   . Diabetes Daughter     No Known Allergies   REVIEW OF SYSTEMS (Negative unless checked)  Constitutional: [] Weight loss  [] Fever  [] Chills Cardiac: [] Chest pain   [] Chest pressure   [] Palpitations   [] Shortness of breath when laying flat   [] Shortness of breath with exertion. Vascular:  [] Pain in legs with walking   [] Pain in legs at rest  [] History of DVT   [] Phlebitis   [] Swelling in legs   [] Varicose veins   [] Non-healing ulcers Pulmonary:   [] Uses home oxygen   [] Productive cough   [] Hemoptysis   [] Wheeze  [] COPD   [] Asthma Neurologic:  [] Dizziness   [] Seizures   [] History of stroke   [] History of TIA  [] Aphasia   [] Vissual changes   [] Weakness or numbness in arm   [] Weakness or numbness in leg Musculoskeletal:   [] Joint swelling   [] Joint pain   [] Low back pain Hematologic:  [] Easy bruising  [] Easy bleeding   [] Hypercoagulable state   [] Anemic Gastrointestinal:  [] Diarrhea   [] Vomiting  [] Gastroesophageal reflux/heartburn   [] Difficulty swallowing. Genitourinary:  [] Chronic kidney disease   [] Difficult urination  [] Frequent urination   [] Blood in urine Skin:  [] Rashes   [] Ulcers  Psychological:  [] History of anxiety   []  History of major depression.  Physical Examination  There were no vitals filed for this visit. There  is no height or weight on file to calculate BMI. Gen: WD/WN, NAD Head: [place]/AT, No temporalis wasting.  Ear/Nose/Throat: Hearing grossly intact, nares w/o erythema or drainage Eyes: PER, EOMI, sclera nonicteric.  Neck: Supple, no large masses.   Pulmonary:  Good air movement, no audible wheezing bilaterally, no use of accessory muscles.  Cardiac: RRR, no JVD Vascular:  Vessel Right Left  Radial Palpable Palpable  PT BKA Not Palpable  DP BKA Not Palpable  Gastrointestinal: Non-distended. No guarding/no peritoneal signs.  Musculoskeletal: M/S 5/5 throughout.  No deformity or atrophy.  Neurologic: CN 2-12  intact. Symmetrical.  Speech is fluent. Motor exam as listed above. Psychiatric: Judgment intact, Mood & affect appropriate for pt's clinical situation. Dermatologic: No rashes or ulcers noted.  No changes consistent with cellulitis.   CBC Lab Results  Component Value Date   WBC 6.2 05/17/2019   HGB 11.4 (L) 10/01/2019   HCT 33.2 (L) 05/17/2019   MCV 87 05/17/2019   PLT 260 05/17/2019    BMET    Component Value Date/Time   NA 140 10/01/2019 1550   NA 141 11/12/2012 0717   K 4.4 10/01/2019 1550   K 4.0 11/12/2012 0717   CL 101 10/01/2019 1550   CL 106 11/12/2012 0717   CO2 24 10/01/2019 1550   CO2 31 11/12/2012 0717   GLUCOSE 180 (H) 10/01/2019 1550   GLUCOSE 138 (H) 01/06/2016 0432   GLUCOSE 149 (H) 11/12/2012 0717   BUN 13 10/01/2019 1550   BUN 10 11/12/2012 0717   CREATININE 0.90 10/01/2019 1550   CREATININE 0.99 11/12/2012 0717   CALCIUM 10.0 10/01/2019 1550   CALCIUM 9.4 11/12/2012 0717   GFRNONAA 78 10/01/2019 1550   GFRNONAA >60 11/12/2012 0717   GFRAA 90 10/01/2019 1550   GFRAA >60 11/12/2012 0717   CrCl cannot be calculated (Patient's most recent lab result is older than the maximum 21 days allowed.).  COAG Lab Results  Component Value Date   INR 1.05 01/04/2016    Radiology No results found.   Assessment/Plan 1. Atherosclerosis of native artery of left lower extremity with intermittent claudication (HCC)  Recommend:  The patient has evidence of atherosclerosis of the lower extremities with claudication.  The patient does not voice lifestyle limiting changes at this point in time.  Noninvasive studies do not suggest clinically significant change.  No invasive studies, angiography or surgery at this time The patient should continue walking and begin a more formal exercise program.  The patient should continue antiplatelet therapy and aggressive treatment of the lipid abnormalities  No changes in the patient's medications at this time  The  patient should continue wearing graduated compression socks 10-15 mmHg strength to control the mild edema.   - VAS [place] ABI WITH/WO TBI; Future  2. BKA stump complication (HCC) He has an improved prosthesis and will continue to follow with [facility]  3. Essential hypertension Continue antihypertensive medications as already ordered, these medications have been reviewed and there are no changes at this time.   4. Type 2 diabetes mellitus with diabetic peripheral angiopathy and gangrene, with long-term current use of insulin (HCC) Continue hypoglycemic medications as already ordered, these medications have been reviewed and there are no changes at this time.  Hgb A1C to be monitored as already arranged by primary service   5. Familial hypercholesterolemia Continue statin as ordered and reviewed, no changes at this time     [name], MD  09/03/2020 1:19 PM

## 2020-09-08 ENCOUNTER — Telehealth: Payer: Self-pay

## 2020-09-08 ENCOUNTER — Ambulatory Visit (INDEPENDENT_AMBULATORY_CARE_PROVIDER_SITE_OTHER): Payer: Medicare Other | Admitting: Family Medicine

## 2020-09-08 ENCOUNTER — Encounter: Payer: Self-pay | Admitting: Family Medicine

## 2020-09-08 ENCOUNTER — Other Ambulatory Visit: Payer: Self-pay

## 2020-09-08 VITALS — BP 130/80 | HR 68 | Ht 72.0 in | Wt 152.0 lb

## 2020-09-08 DIAGNOSIS — E7801 Familial hypercholesterolemia: Secondary | ICD-10-CM | POA: Diagnosis not present

## 2020-09-08 DIAGNOSIS — E1159 Type 2 diabetes mellitus with other circulatory complications: Secondary | ICD-10-CM | POA: Diagnosis not present

## 2020-09-08 DIAGNOSIS — M722 Plantar fascial fibromatosis: Secondary | ICD-10-CM | POA: Diagnosis not present

## 2020-09-08 DIAGNOSIS — I10 Essential (primary) hypertension: Secondary | ICD-10-CM | POA: Diagnosis not present

## 2020-09-08 MED ORDER — SITAGLIPTIN PHOSPHATE 100 MG PO TABS: 100.0000 mg | ORAL_TABLET | Freq: Every day | ORAL | 1 refills | Status: DC

## 2020-09-08 MED ORDER — HYDROCHLOROTHIAZIDE 25 MG PO TABS
25.0000 mg | ORAL_TABLET | Freq: Every day | ORAL | 1 refills | Status: DC
Start: 2020-09-08 — End: 2021-02-19

## 2020-09-08 MED ORDER — ATORVASTATIN CALCIUM 20 MG PO TABS: 20.0000 mg | ORAL_TABLET | Freq: Every day | ORAL | 2 refills | Status: DC

## 2020-09-08 MED ORDER — METFORMIN HCL 500 MG PO TABS: 500.0000 mg | ORAL_TABLET | Freq: Two times a day (BID) | ORAL | 0 refills | Status: DC

## 2020-09-08 MED ORDER — AMLODIPINE BESYLATE 10 MG PO TABS: 1.0000 | ORAL_TABLET | Freq: Every day | ORAL | 1 refills | Status: DC

## 2020-09-08 MED ORDER — GLIPIZIDE ER 10 MG PO TB24: 10.0000 mg | ORAL_TABLET | Freq: Every day | ORAL | 1 refills | Status: DC

## 2020-09-08 MED ORDER — METOPROLOL TARTRATE 50 MG PO TABS: 50.0000 mg | ORAL_TABLET | Freq: Two times a day (BID) | ORAL | 0 refills | Status: DC

## 2020-09-08 MED ORDER — LISINOPRIL 10 MG PO TABS: 10.0000 mg | ORAL_TABLET | Freq: Every day | ORAL | 1 refills | Status: DC

## 2020-09-08 NOTE — Telephone Encounter (Signed)
Pt was seen today and is confused as to why he has not heard from his prosthetic leg. Daughter was with pt and conveyed the same message. I called the number I had for Purnell Shoemaker- the lady that has been helping me with the pt for the past year. The number has been disconnected. I called and talked to [place] at [facility] and she told me to speak with Cordelia Pen at [phone]. I was informed Cordelia Pen no longer is there and "Dawn" helped me. I am under the impression that Dawn didn't realize this has been going on since July of last year as she couldn't understand why "all other places are calling me." I explained to her that the pt was asked in January to see [facility]. He saw them in Feb and then again on May 10,22. He is still waiting on his leg. Dawn stated the information was submitted on May 11 and it would take 5-15 days for insurance to respond. I have informed pt and daughter to call us back if they have not heard from anyone by Monday of next week. By that time, it will be 12 days since submission to ins.

## 2020-09-08 NOTE — Progress Notes (Signed)
Date:  09/08/2020   Name:  Jose Schroeder   DOB:  1933-01-22   MRN:  [id number]   Chief Complaint: Diabetes  Diabetes He presents for his follow-up diabetic visit. He has type 2 diabetes mellitus. His disease course has been stable. There are no hypoglycemic associated symptoms. There are no diabetic associated symptoms. Pertinent negatives for diabetes include no blurred vision and no chest pain. There are no hypoglycemic complications. Symptoms are stable. There are no diabetic complications. There are no known risk factors for coronary artery disease. Current diabetic treatment includes oral agent (triple therapy). He is compliant with treatment all of the time. He is following a generally healthy diet. Meal planning includes avoidance of concentrated sweets and carbohydrate counting. He has had a previous visit with a dietitian. He participates in exercise intermittently. Home blood sugar record trend: uncertain. An ACE inhibitor/angiotensin II receptor blocker is being taken.  Hypertension This is a chronic problem. The current episode started more than 1 year ago. The problem has been gradually improving since onset. The problem is controlled. Pertinent negatives include no anxiety, blurred vision, chest pain, neck pain, orthopnea, palpitations, peripheral edema, PND or shortness of breath. Past treatments include ACE inhibitors, calcium channel blockers, beta blockers and diuretics. The current treatment provides moderate improvement.  Hyperlipidemia This is a chronic problem. The current episode started more than 1 year ago. The problem is controlled. Recent lipid tests were reviewed and are normal. Pertinent negatives include no chest pain or shortness of breath. Current antihyperlipidemic treatment includes statins. The current treatment provides moderate improvement of lipids.    Lab Results  Component Value Date   CREATININE 0.90 10/01/2019   BUN 13 10/01/2019   NA 140  10/01/2019   K 4.4 10/01/2019   CL 101 10/01/2019   CO2 24 10/01/2019   Lab Results  Component Value Date   CHOL 124 05/25/2020   HDL 39 (L) 05/25/2020   LDLCALC 66 05/25/2020   TRIG 104 05/25/2020   CHOLHDL 3.0 12/15/2016   No results found for: TSH Lab Results  Component Value Date   HGBA1C 7.4 (H) 05/25/2020   Lab Results  Component Value Date   WBC 6.2 05/17/2019   HGB 11.4 (L) 10/01/2019   HCT 33.2 (L) 05/17/2019   MCV 87 05/17/2019   PLT 260 05/17/2019   Lab Results  Component Value Date   ALT 7 05/17/2019   AST 17 05/17/2019   ALKPHOS 53 05/17/2019   BILITOT 0.3 05/17/2019     Review of Systems  Eyes: Negative for blurred vision.  Respiratory: Negative for shortness of breath.   Cardiovascular: Negative for chest pain, palpitations, orthopnea and PND.  Musculoskeletal: Negative for neck pain.    Patient Active Problem List   Diagnosis Date Noted  . Atherosclerosis of native arteries of extremity with intermittent claudication (HCC) 03/30/2020  . BKA stump complication (HCC) 03/30/2020  . Absolute anemia 10/01/2019  . Familial hypercholesterolemia 10/01/2019  . 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 Amputated below knee, right (HCC) 05/09/2017  . PVD (peripheral vascular disease) (HCC) 12/15/2016  . 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 Ulcer of right foot (HCC) 01/31/2016  . Amputated great toe (HCC) 01/11/2016  . Toe infection 01/04/2016  . Diabetes mellitus (HCC) 09/24/2015  . Essential hypertension 09/24/2015    No Known Allergies  Past Surgical History:  Procedure Laterality Date  . AMPUTATION TOE Right 01/15/2016   Procedure: AMPUTATION  TOE;  Surgeon: Gwyneth Revels, DPM;  Location: [facility] ORS;  Service: Podiatry;  Laterality: Right;  . PERIPHERAL VASCULAR CATHETERIZATION Right 01/07/2016   Procedure: Abdominal Aortogram w/Lower Extremity;  Surgeon: Renford Dills, MD;  Location: [facility] INVASIVE CV [facility];  Service:  Cardiovascular;  Laterality: Right;  . TOE SURGERY     amputated 2 toes on L) foot    Social History   Tobacco Use  . Smoking status: Never Smoker  . Smokeless tobacco: Never Used  Vaping Use  . Vaping Use: Never used  Substance Use Topics  . Alcohol use: No    Alcohol/week: 0.0 standard drinks  . Drug use: No     Medication list has been reviewed and updated.  No outpatient medications have been marked as taking for the 09/08/20 encounter (Office Visit) with Duanne Limerick, MD.    [facility] 2/9 Scores 12/05/2019 10/01/2019 09/30/2019 01/14/2019  PHQ - 2 Score 0 0 0 0  PHQ- 9 Score 0 0 - 0    GAD 7 : Generalized Anxiety Score 12/05/2019 10/01/2019  Nervous, Anxious, on Edge 0 0  Control/stop worrying 0 0  Worry too much - different things 0 0  Trouble relaxing 0 0  Restless 0 0  Easily annoyed or irritable 0 0  Afraid - awful might happen 0 0  Total GAD 7 Score 0 0  Anxiety Difficulty Not difficult at all -    BP Readings from Last 3 Encounters:  09/08/20 130/80  09/03/20 (!) 166/57  06/04/20 (!) 176/79    Physical Exam  Wt Readings from Last 3 Encounters:  09/08/20 152 lb (68.9 kg)  06/04/20 155 lb 6.4 oz (70.5 kg)  05/25/20 156 lb (70.8 kg)    BP 130/80   Pulse 68   Ht 6' (1.829 m)   Wt 152 lb (68.9 kg)   BMI 20.61 kg/m   Assessment and Plan:  1. Type 2 diabetes mellitus with other circulatory complication, without long-term current use of insulin (HCC) Chronic.  Controlled.  Stable.  Continue glipizide XL 10 mg once a day metformin 500 mg 1 twice a day and Januvia 100 mg once a day.  Will check A1c for current diabetic control. - HgB A1c - Microalbumin, urine - glipiZIDE (GLUCOTROL XL) 10 MG 24 hr tablet; Take 1 tablet (10 mg total) by mouth daily with breakfast.  Dispense: 90 tablet; Refill: 1 - sitaGLIPtin (JANUVIA) 100 MG tablet; Take 1 tablet (100 mg total) by mouth daily.  Dispense: 90 tablet; Refill: 1 - metFORMIN (GLUCOPHAGE) 500 MG tablet; Take 1  tablet (500 mg total) by mouth 2 (two) times daily.  Dispense: 180 tablet; Refill: 0 - Renal Function Panel  2. Essential hypertension Chronic.  Controlled.  Stable.  Blood pressure today is 130/80.  Continue Lopressor 50 mg 1 twice a day hydrochlorothiazide 25 mg once a day lisinopril 10 mg once a day and amlodipine 10 mg once a day.  Will check renal function panel for electrolytes and GFR. - amLODipine (NORVASC) 10 MG tablet; Take 1 tablet (10 mg total) by mouth daily.  Dispense: 90 tablet; Refill: 1 - lisinopril (ZESTRIL) 10 MG tablet; Take 1 tablet (10 mg total) by mouth daily.  Dispense: 90 tablet; Refill: 1 - hydrochlorothiazide (HYDRODIURIL) 25 MG tablet; Take 1 tablet (25 mg total) by mouth daily.  Dispense: 90 tablet; Refill: 1 - metoprolol tartrate (LOPRESSOR) 50 MG tablet; Take 1 tablet (50 mg total) by mouth 2 (two) times daily.  Dispense: 180  tablet; Refill: 0 - Renal Function Panel  3. Familial hypercholesterolemia Chronic.  Controlled.  Stable.  Continue atorvastatin 20 mg once a day.  Will check lipid panel for current status. - atorvastatin (LIPITOR) 20 MG tablet; Take 1 tablet (20 mg total) by mouth daily.  Dispense: 90 tablet; Refill: 2 - Lipid panel  4. Plantar fasciitis Chronic.  Episodic.  Currently symptomatic.  Patient is having more difficulty with heel pain of the left leg.  Patient saw approximately 1 year ago Dr. Ether Griffins who treated it with injection.  We will refer back to Dr. Ether Griffins for evaluation and treatment. - Ambulatory referral to Podiatry

## 2020-09-09 LAB — RENAL FUNCTION PANEL
Albumin: 4.7 g/dL — ABNORMAL HIGH (ref 3.6–4.6)
BUN/Creatinine Ratio: 17 (ref 10–24)
BUN: 12 mg/dL (ref 8–27)
CO2: 27 mmol/L (ref 20–29)
Calcium: 9.7 mg/dL (ref 8.6–10.2)
Chloride: 97 mmol/L (ref 96–106)
Creatinine, Ser: 0.72 mg/dL — ABNORMAL LOW (ref 0.76–1.27)
Glucose: 201 mg/dL — ABNORMAL HIGH (ref 65–99)
Phosphorus: 3.4 mg/dL (ref 2.8–4.1)
Potassium: 4.4 mmol/L (ref 3.5–5.2)
Sodium: 140 mmol/L (ref 134–144)
eGFR: 89 mL/min/{1.73_m2} (ref >59)

## 2020-09-09 LAB — LIPID PANEL
Chol/HDL Ratio: 2.6 ratio (ref 0.0–5.0)
Cholesterol, Total: 123 mg/dL (ref 100–199)
HDL: 48 mg/dL (ref >39)
LDL Chol Calc (NIH): 59 mg/dL (ref 0–99)
Triglycerides: 82 mg/dL (ref 0–149)
VLDL Cholesterol Cal: 16 mg/dL (ref 5–40)

## 2020-09-09 LAB — HEMOGLOBIN A1C
Est. average glucose Bld gHb Est-mCnc: 183 mg/dL
Hgb A1c MFr Bld: 8 % — ABNORMAL HIGH (ref 4.8–5.6)

## 2020-09-10 ENCOUNTER — Other Ambulatory Visit: Payer: Self-pay

## 2020-09-10 DIAGNOSIS — E1159 Type 2 diabetes mellitus with other circulatory complications: Secondary | ICD-10-CM

## 2020-09-25 DIAGNOSIS — Z89511 Acquired absence of right leg below knee: Secondary | ICD-10-CM | POA: Diagnosis not present

## 2020-09-30 ENCOUNTER — Ambulatory Visit: Payer: Medicare Other

## 2020-10-28 ENCOUNTER — Ambulatory Visit (INDEPENDENT_AMBULATORY_CARE_PROVIDER_SITE_OTHER): Payer: Medicare Other

## 2020-10-28 ENCOUNTER — Other Ambulatory Visit: Payer: Self-pay

## 2020-10-28 VITALS — BP 132/70 | HR 89 | Temp 97.5°F | Resp 16 | Ht 72.0 in | Wt 142.2 lb

## 2020-10-28 DIAGNOSIS — Z0000 Encounter for general adult medical examination without abnormal findings: Secondary | ICD-10-CM | POA: Diagnosis not present

## 2020-10-28 NOTE — Progress Notes (Signed)
Subjective:   Jose Schroeder is a 87 y.o. male who presents for Medicare Annual/Subsequent preventive examination.  Review of Systems     Cardiac Risk Factors include: advanced age (>37men, >66 women);diabetes mellitus;dyslipidemia;male gender;hypertension;sedentary lifestyle     Objective:    Today's Vitals   10/28/20 1524  BP: 132/70  Pulse: 89  Resp: 16  Temp: (!) 97.5 F (36.4 C)  TempSrc: Oral  SpO2: 97%  Weight: 142 lb 3.2 oz (64.5 kg)  Height: 6' (1.829 m)   Body mass index is 19.29 kg/m.  Advanced Directives 10/28/2020 09/30/2019 09/24/2018 07/27/2016 01/15/2016 01/14/2016 01/04/2016  Does Patient Have a Medical Advance Directive? No No No No No No No  Would patient like information on creating a medical advance directive? No - Patient declined No - Patient declined Yes (MAU/Ambulatory/Procedural Areas - Information given) No - Patient declined No - patient declined information No - patient declined information No - patient declined information    Current Medications (verified) Outpatient Encounter Medications as of 10/28/2020  Medication Sig   amLODipine (NORVASC) 10 MG tablet Take 1 tablet (10 mg total) by mouth daily.   aspirin EC 81 MG tablet Take 1 tablet (81 mg total) by mouth daily.   atorvastatin (LIPITOR) 20 MG tablet Take 1 tablet (20 mg total) by mouth daily.   ferrous sulfate 325 (65 FE) MG tablet Take 1 tablet (325 mg total) by mouth daily with breakfast.   gabapentin (NEURONTIN) 100 MG capsule Take 1 capsule (100 mg total) by mouth 3 (three) times daily.   glipiZIDE (GLUCOTROL XL) 10 MG 24 hr tablet Take 1 tablet (10 mg total) by mouth daily with breakfast.   hydrochlorothiazide (HYDRODIURIL) 25 MG tablet Take 1 tablet (25 mg total) by mouth daily.   lisinopril (ZESTRIL) 10 MG tablet Take 1 tablet (10 mg total) by mouth daily.   metFORMIN (GLUCOPHAGE) 500 MG tablet Take 1 tablet (500 mg total) by mouth 2 (two) times daily.   metoprolol tartrate (LOPRESSOR)  50 MG tablet Take 1 tablet (50 mg total) by mouth 2 (two) times daily.   ONETOUCH ULTRA test strip USE AS DIRECTED BY  PHYSICIAN   sitaGLIPtin (JANUVIA) 100 MG tablet Take 1 tablet (100 mg total) by mouth daily.   No facility-administered encounter medications on file as of 10/28/2020.    Allergies (verified) Patient has no known allergies.   History: Past Medical History:  Diagnosis Date   Diabetes mellitus without complication (HCC)    Hypertension    Peripheral vascular disease (HCC)    Past Surgical History:  Procedure Laterality Date   AMPUTATION TOE Right 01/15/2016   Procedure: AMPUTATION TOE;  Surgeon: Gwyneth Revels, DPM;  Location: [facility] ORS;  Service: Podiatry;  Laterality: Right;   PERIPHERAL VASCULAR CATHETERIZATION Right 01/07/2016   Procedure: Abdominal Aortogram w/Lower Extremity;  Surgeon: Renford Dills, MD;  Location: [facility] INVASIVE CV LAB;  Service: Cardiovascular;  Laterality: Right;   TOE SURGERY     amputated 2 toes on L) foot   Family History  Problem Relation Age of Onset   Diabetes Mother    Hypertension Mother    Diabetes Father    Hypertension Father    Diabetes Daughter    Social History   Socioeconomic History   Marital status: Married    Spouse name: Not on file   Number of children: 4   Years of education: Not on file   Highest education level: 8th grade  Occupational History  Occupation: retired  Tobacco Use   Smoking status: Never   Smokeless tobacco: Never   Tobacco comments:    Smoking cessation materials not required  Vaping Use   Vaping Use: Never used  Substance and Sexual Activity   Alcohol use: No    Alcohol/week: 0.0 standard drinks   Drug use: No   Sexual activity: Not Currently  Other Topics Concern   Not on file  Social History Narrative   Not on file   Social Determinants of Health   Financial Resource Strain: Low Risk    Difficulty of Paying Living Expenses: Not very hard  Food Insecurity: No Food Insecurity    Worried About Programme researcher, broadcasting/film/videounning Out of Food in the Last Year: Never true   Ran Out of Food in the Last Year: Never true  Transportation Needs: No Transportation Needs   Lack of Transportation (Medical): No   Lack of Transportation (Non-Medical): No  Physical Activity: Inactive   Days of Exercise per Week: 0 days   Minutes of Exercise per Session: 0 min  Stress: No Stress Concern Present   Feeling of Stress : Not at all  Social Connections: Moderately Isolated   Frequency of Communication with Friends and Family: More than three times a week   Frequency of Social Gatherings with Friends and Family: Not on file   Attends Religious Services: Never   Database administratorActive Member of Clubs or Organizations: No   Attends Engineer, structuralClub or Organization Meetings: Never   Marital Status: Married    Tobacco Counseling Counseling given: No Tobacco comments: Smoking cessation materials not required   Clinical Intake:  Pre-visit preparation completed: Yes  Pain : No/denies pain     BMI - recorded: 19.29 Nutritional Status: BMI of 19-24  Normal Nutritional Risks: None Diabetes: Yes CBG done?: No Did pt. bring in CBG monitor from home?: No  How often do you need to have someone help you when you read instructions, pamphlets, or other written materials from your doctor or pharmacy?: 1 - Never  Nutrition Risk Assessment:  Has the patient had any N/V/D within the last 2 months?  No  Does the patient have any non-healing wounds?  No  Has the patient had any unintentional weight loss or weight gain?  No   Diabetes:  Is the patient diabetic?  Yes  If diabetic, was a CBG obtained today?  No  Did the patient bring in their glucometer from home?  No  How often do you monitor your CBG's? Pt does not actively check blood sugar.   Financial Strains and Diabetes Management:  Are you having any financial strains with the device, your supplies or your medication? No .  Does the patient want to be seen by Chronic Care  Management for management of their diabetes?  No  Would the patient like to be referred to a Nutritionist or for Diabetic Management?  No   Diabetic Exams:  Diabetic Eye Exam: Completed 05/28/20 positive retinopathy.   Diabetic Foot Exam: Completed 09/08/20.   Interpreter Needed?: No  Information entered by :: Reather LittlerKasey Dian Minahan LPN   Activities of Daily Living In your present state of health, do you have any difficulty performing the following activities: 10/28/2020  Hearing? N  Vision? Y  Difficulty concentrating or making decisions? Y  Walking or climbing stairs? Y  Dressing or bathing? N  Doing errands, shopping? Y  Preparing Food and eating ? N  Using the Toilet? N  In the past six months, have you accidently  leaked urine? N  Do you have problems with loss of bowel control? N  Managing your Medications? Y  Managing your Finances? Y  Housekeeping or managing your Housekeeping? Y  Some recent data might be hidden    Patient Care Team: Duanne Limerick, MD as PCP - General (Family Medicine) Schnier, Latina Craver, MD (Vascular Surgery) Gwyneth Revels, DPM as Referring Physician (Podiatry)  Indicate any recent Medical Services you may have received from other than [facility] providers in the past year (date may be approximate).     Assessment:   This is a routine wellness examination for Jose Schroeder.  Hearing/Vision screen Hearing Screening - Comments::  Pt denies hearing difficulty Vision Screening - Comments:: Annual vision screenings [facility] with Dr. Brooke Dare  Dietary issues and exercise activities discussed: Current Exercise Habits: The patient does not participate in regular exercise at present, Exercise limited by: orthopedic condition(s)   Goals Addressed   None    Depression Screen PHQ 2/9 Scores 10/28/2020 12/05/2019 10/01/2019 09/30/2019 01/14/2019 09/24/2018 09/13/2018  PHQ - 2 Score 0 0 0 0 0 0 0  PHQ- 9 Score - 0 0 - 0 - 0    Fall Risk Fall Risk  10/28/2020 12/05/2019  10/01/2019 09/30/2019 09/24/2018  Falls in the past year? 0 0 0 0 0  Number falls in past yr: 0 - - 0 0  Injury with Fall? 0 - - 0 0  Risk for fall due to : Impaired balance/gait;Impaired mobility No Fall Risks - Impaired balance/gait Impaired balance/gait;Impaired mobility  Follow up Falls prevention discussed Falls evaluation completed Falls evaluation completed Falls prevention discussed Falls prevention discussed    FALL RISK PREVENTION PERTAINING TO THE HOME:  Any stairs in or around the home? No  If so, are there any without handrails? No  Home free of loose throw rugs in walkways, pet beds, electrical cords, etc? Yes  Adequate lighting in your home to reduce risk of falls? Yes   ASSISTIVE DEVICES UTILIZED TO PREVENT FALLS:  Life alert? No  Use of a cane, walker or w/c? Yes  Grab bars in the bathroom? Yes  Shower chair or bench in shower? Yes  Elevated toilet seat or a handicapped toilet? No   TIMED UP AND GO:  Was the test performed? Yes .  Length of time to ambulate 10 feet: 9 sec.   Gait slow and steady with assistive device  Cognitive Function: Cognitive status assessed by direct observation. Pt declined 6CIT today; scheduled for follow up with Dr. Yetta Barre 01/12/21      6CIT Screen 09/30/2019 09/24/2018  What Year? 4 points 0 points  What month? 3 points 3 points  What time? 0 points 0 points  Count back from 20 0 points 0 points  Months in reverse 4 points 4 points  Repeat phrase 8 points 6 points  Total Score 19 13    Immunizations Immunization History  Administered Date(s) Administered   Fluad Quad(high Dose 65+) 01/14/2019, 01/31/2020   Influenza,inj,Quad PF,6+ Mos 12/15/2016   Moderna Sars-Covid-2 Vaccination 06/22/2019, 07/20/2019   Pneumococcal Conjugate-13 09/24/2015   Pneumococcal Polysaccharide-23 11/22/2013   Tdap 02/20/2015    TDAP status: Up to date  Flu Vaccine status: Up to date  Pneumococcal vaccine status: Up to date  Covid-19 vaccine status:  Completed vaccines  Qualifies for Shingles Vaccine? Yes   Zostavax completed No   Shingrix Completed?: No.    Education has been provided regarding the importance of this vaccine. Patient  has been advised to call insurance company to determine out of pocket expense if they have not yet received this vaccine. Advised may also receive vaccine at local pharmacy or Health Dept. Verbalized acceptance and understanding.  Screening Tests Health Maintenance  Topic Date Due   Zoster Vaccines- Shingrix (1 of 2) Never done   INFLUENZA VACCINE  11/23/2020   HEMOGLOBIN A1C  03/11/2021   OPHTHALMOLOGY EXAM  05/28/2021   FOOT EXAM  09/08/2021   TETANUS/TDAP  02/19/2025   PNA vac Low Risk Adult  Completed   HPV VACCINES  Aged Out   COVID-19 Vaccine  Discontinued    Health Maintenance  Health Maintenance Due  Topic Date Due   Zoster Vaccines- Shingrix (1 of 2) Never done    Colorectal cancer screening: No longer required.   Lung Cancer Screening: (Low Dose CT Chest recommended if Age 40-80 years, 30 pack-year currently smoking OR have quit w/in 15years.) does not qualify.   Additional Screening:  Hepatitis C Screening: does not qualify.  Vision Screening: Recommended annual ophthalmology exams for early detection of glaucoma and other disorders of the eye. Is the patient up to date with their annual eye exam?  Yes  Who is the provider or what is the name of the office in which the patient attends annual eye exams? Dr. Brooke Dare   Dental Screening: Recommended annual dental exams for proper oral hygiene  Community Resource Referral / Chronic Care Management: CRR required this visit?  No   CCM required this visit?  No      Plan:     I have personally reviewed and noted the following in the patient's chart:   Medical and social history Use of alcohol, tobacco or illicit drugs  Current medications and supplements including opioid prescriptions. Patient is not currently taking opioid  prescriptions. Functional ability and status Nutritional status Physical activity Advanced directives List of other physicians Hospitalizations, surgeries, and ER visits in previous 12 months Vitals Screenings to include cognitive, depression, and falls Referrals and appointments  In addition, I have reviewed and discussed with patient certain preventive protocols, quality metrics, and best practice recommendations. A written personalized care plan for preventive services as well as general preventive health recommendations were provided to patient.     Reather Littler, LPN   10/30/6765   Nurse Notes: pt accompanied to visit today by his brother Hessie Knows. Pt c/o pain in left foot on outer side and heel when laying down at night; no visible swelling or irritation. Pt advised to schedule appt with Dr. Yetta Barre or Dr. Ether Griffins if sxs worsen or persist.

## 2020-10-28 NOTE — Patient Instructions (Signed)
Jose Schroeder , Thank you for taking time to come for your Medicare Wellness Visit. I appreciate your ongoing commitment to your health goals. Please review the following plan we discussed and let me know if I can assist you in the future.   Screening recommendations/referrals: Colonoscopy: no longer required Recommended yearly ophthalmology/optometry visit for glaucoma screening and checkup Recommended yearly dental visit for hygiene and checkup  Vaccinations: Influenza vaccine: done 01/31/20 Pneumococcal vaccine: done 09/24/15 Tdap vaccine: done 02/20/15 Shingles vaccine: Shingrix discussed. Please contact your pharmacy for coverage information.  Covid-19:  done 06/22/19 & 07/20/19; due for booster vaccine  Advanced directives: Advance directive discussed with you today. Even though you declined this today please call our office should you change your mind and we can give you the proper paperwork for you to fill out.   Conditions/risks identified: Recommend healthy eating to help lower A1c for diabetes management.   Next appointment: Follow up in one year for your annual wellness visit.   Preventive Care 4365 Years and Older, Male Preventive care refers to lifestyle choices and visits with your health care provider that can promote health and wellness. What does preventive care include? A yearly physical exam. This is also called an annual well check. Dental exams once or twice a year. Routine eye exams. Ask your health care provider how often you should have your eyes checked. Personal lifestyle choices, including: Daily care of your teeth and gums. Regular physical activity. Eating a healthy diet. Avoiding tobacco and drug use. Limiting alcohol use. Practicing safe sex. Taking low doses of aspirin every day. Taking vitamin and mineral supplements as recommended by your health care provider. What happens during an annual well check? The services and screenings done by your health  care provider during your annual well check will depend on your age, overall health, lifestyle risk factors, and family history of disease. Counseling  Your health care provider may ask you questions about your: Alcohol use. Tobacco use. Drug use. Emotional well-being. Home and relationship well-being. Sexual activity. Eating habits. History of falls. Memory and ability to understand (cognition). Work and work [employer]. Screening  You may have the following tests or measurements: Height, weight, and BMI. Blood pressure. Lipid and cholesterol levels. These may be checked every 5 years, or more frequently if you are over 85 years old. Skin check. Lung cancer screening. You may have this screening every year starting at age 85 if you have a 30-pack-year history of smoking and currently smoke or have quit within the past 15 years. Fecal occult blood test (FOBT) of the stool. You may have this test every year starting at age 85. Flexible sigmoidoscopy or colonoscopy. You may have a sigmoidoscopy every 5 years or a colonoscopy every 10 years starting at age 85. Prostate cancer screening. Recommendations will vary depending on your family history and other risks. Hepatitis C blood test. Hepatitis B blood test. Sexually transmitted disease (STD) testing. Diabetes screening. This is done by checking your blood sugar (glucose) after you have not eaten for a while (fasting). You may have this done every 1-3 years. Abdominal aortic aneurysm (AAA) screening. You may need this if you are a current or former smoker. Osteoporosis. You may be screened starting at age 85 if you are at high risk. Talk with your health care provider about your test results, treatment options, and if necessary, the need for more tests. Vaccines  Your health care provider may recommend certain vaccines, such as: Influenza vaccine. This is  recommended every year. Tetanus, diphtheria, and acellular pertussis (Tdap, Td)  vaccine. You may need a Td booster every 10 years. Zoster vaccine. You may need this after age 53. Pneumococcal 13-valent conjugate (PCV13) vaccine. One dose is recommended after age 70. Pneumococcal polysaccharide (PPSV23) vaccine. One dose is recommended after age 77. Talk to your health care provider about which screenings and vaccines you need and how often you need them. This information is not intended to replace advice given to you by your health care provider. Make sure you discuss any questions you have with your health care provider. Document Released: 05/08/2015 Document Revised: 12/30/2015 Document Reviewed: 02/10/2015 Elsevier Interactive Patient Education  2017 [place] Prevention in the Home Falls can cause injuries. They can happen to people of all ages. There are many things you can do to make your home safe and to help prevent falls. What can I do on the outside of my home? Regularly fix the edges of walkways and driveways and fix any cracks. Remove anything that might make you trip as you walk through a door, such as a raised step or threshold. Trim any bushes or trees on the path to your home. Use bright outdoor lighting. Clear any walking paths of anything that might make someone trip, such as rocks or tools. Regularly check to see if handrails are loose or broken. Make sure that both sides of any steps have handrails. Any raised decks and porches should have guardrails on the edges. Have any leaves, snow, or ice cleared regularly. Use sand or salt on walking paths during winter. Clean up any spills in your garage right away. This includes oil or grease spills. What can I do in the bathroom? Use night lights. Install grab bars by the toilet and in the tub and shower. Do not use towel bars as grab bars. Use non-skid mats or decals in the tub or shower. If you need to sit down in the shower, use a plastic, non-slip stool. Keep the floor dry. Clean up any  water that spills on the floor as soon as it happens. Remove soap buildup in the tub or shower regularly. Attach bath mats securely with double-sided non-slip rug tape. Do not have throw rugs and other things on the floor that can make you trip. What can I do in the bedroom? Use night lights. Make sure that you have a light by your bed that is easy to reach. Do not use any sheets or blankets that are too big for your bed. They should not hang down onto the floor. Have a firm chair that has side arms. You can use this for support while you get dressed. Do not have throw rugs and other things on the floor that can make you trip. What can I do in the kitchen? Clean up any spills right away. Avoid walking on wet floors. Keep items that you use a lot in easy-to-reach places. If you need to reach something above you, use a strong step stool that has a grab bar. Keep electrical cords out of the way. Do not use floor polish or wax that makes floors slippery. If you must use wax, use non-skid floor wax. Do not have throw rugs and other things on the floor that can make you trip. What can I do with my stairs? Do not leave any items on the stairs. Make sure that there are handrails on both sides of the stairs and use them. Fix handrails that are  broken or loose. Make sure that handrails are as long as the stairways. Check any carpeting to make sure that it is firmly attached to the stairs. Fix any carpet that is loose or worn. Avoid having throw rugs at the top or bottom of the stairs. If you do have throw rugs, attach them to the floor with carpet tape. Make sure that you have a light switch at the top of the stairs and the bottom of the stairs. If you do not have them, ask someone to add them for you. What else can I do to help prevent falls? Wear shoes that: Do not have high heels. Have rubber bottoms. Are comfortable and fit you well. Are closed at the toe. Do not wear sandals. If you use a  stepladder: Make sure that it is fully opened. Do not climb a closed stepladder. Make sure that both sides of the stepladder are locked into place. Ask someone to hold it for you, if possible. Clearly mark and make sure that you can see: Any grab bars or handrails. First and last steps. Where the edge of each step is. Use tools that help you move around (mobility aids) if they are needed. These include: Canes. Walkers. Scooters. Crutches. Turn on the lights when you go into a dark area. Replace any light bulbs as soon as they burn out. Set up your furniture so you have a clear path. Avoid moving your furniture around. If any of your floors are uneven, fix them. If there are any pets around you, be aware of where they are. Review your medicines with your doctor. Some medicines can make you feel dizzy. This can increase your chance of falling. Ask your doctor what other things that you can do to help prevent falls. This information is not intended to replace advice given to you by your health care provider. Make sure you discuss any questions you have with your health care provider. Document Released: 02/05/2009 Document Revised: 09/17/2015 Document Reviewed: 05/16/2014 Elsevier Interactive Patient Education  2017 Else[employer] Inc[employer]   Diabetes Mellitus and Nutrition, Adult When you have diabetes, or diabetes mellitus, it is very important to have healthy eating habits because your blood sugar (glucose) levels are greatly affected by what you eat and drink. Eating healthy foods in the right amounts, at about the same times every day, can help you: Control your blood glucose. Lower your risk of heart disease. Improve your blood pressure. Reach or maintain a healthy weight. What can affect my meal plan? Every person with diabetes is different, and each person has different needs for a meal plan. Your health care provider may recommend that you work with a dietitian to make a meal plan that is  best for you. Your meal plan may vary depending on factors such as: The calories you need. The medicines you take. Your weight. Your blood glucose, blood pressure, and cholesterol levels. Your activity level. Other health conditions you have, such as heart or kidney disease. How do carbohydrates affect me? Carbohydrates, also called carbs, affect your blood glucose level more than any other type of food. Eating carbs naturally raises the amount of glucose in your blood. Carb counting is a method for keeping track of how many carbs you eat. Counting carbs is important to keep your blood glucose at a healthy level,especially if you use insulin or take certain oral diabetes medicines. It is important to know how many carbs you can safely have in each meal. This is different  for every person. Your dietitian can help you calculate how manycarbs you should have at each meal and for each snack. How does alcohol affect me? Alcohol can cause a sudden decrease in blood glucose (hypoglycemia), especially if you use insulin or take certain oral diabetes medicines. Hypoglycemia can be a life-threatening condition. Symptoms of hypoglycemia, such as sleepiness, dizziness, and confusion, are similar to symptoms of having too much alcohol. Do not drink alcohol if: Your health care provider tells you not to drink. You are pregnant, may be pregnant, or are planning to become pregnant. If you drink alcohol: Do not drink on an empty stomach. Limit how much you use to: 0-1 drink a day for women. 0-2 drinks a day for men. Be aware of how much alcohol is in your drink. In the [place] one drink equals one 12 oz bottle of beer (355 mL), one 5 oz glass of wine (148 mL), or one 1 oz glass of hard liquor (44 mL). Keep yourself hydrated with water, diet soda, or unsweetened iced tea. Keep in mind that regular soda, juice, and other mixers may contain a lot of sugar and must be counted as carbs. What are tips for following  this plan?  Reading food labels Start by checking the serving size on the "Nutrition Facts" label of packaged foods and drinks. The amount of calories, carbs, fats, and other nutrients listed on the label is based on one serving of the item. Many items contain more than one serving per package. Check the total grams (g) of carbs in one serving. You can calculate the number of servings of carbs in one serving by dividing the total carbs by 15. For example, if a food has 30 g of total carbs per serving, it would be equal to 2 servings of carbs. Check the number of grams (g) of saturated fats and trans fats in one serving. Choose foods that have a low amount or none of these fats. Check the number of milligrams (mg) of salt (sodium) in one serving. Most people should limit total sodium intake to less than 2,300 mg per day. Always check the nutrition information of foods labeled as "low-fat" or "nonfat." These foods may be higher in added sugar or refined carbs and should be avoided. Talk to your dietitian to identify your daily goals for nutrients listed on the label. Shopping Avoid buying canned, pre-made, or processed foods. These foods tend to be high in fat, sodium, and added sugar. Shop around the outside edge of the grocery store. This is where you will most often find fresh fruits and vegetables, bulk grains, fresh meats, and fresh dairy. Cooking Use low-heat cooking methods, such as baking, instead of high-heat cooking methods like deep frying. Cook using healthy oils, such as olive, canola, or sunflower oil. Avoid cooking with butter, cream, or high-fat meats. Meal planning Eat meals and snacks regularly, preferably at the same times every day. Avoid going long periods of time without eating. Eat foods that are high in fiber, such as fresh fruits, vegetables, beans, and whole grains. Talk with your dietitian about how many servings of carbs you can eat at each meal. Eat 4-6 oz (112-168 g) of  lean protein each day, such as lean meat, chicken, fish, eggs, or tofu. One ounce (oz) of lean protein is equal to: 1 oz (28 g) of meat, chicken, or fish. 1 egg.  cup (62 g) of tofu. Eat some foods each day that contain healthy fats, such as avocado,  nuts, seeds, and fish. What foods should I eat? Fruits Berries. Apples. Oranges. Peaches. Apricots. Plums. Grapes. Mango. Papaya.Pomegranate. Kiwi. Cherries. Vegetables Lettuce. Spinach. Leafy greens, including kale, chard, collard greens, and mustard greens. Beets. Cauliflower. Cabbage. Broccoli. Carrots. Green beans.Tomatoes. Peppers. Onions. Cucumbers. Brussels sprouts. Grains Whole grains, such as whole-wheat or whole-grain bread, crackers, tortillas,cereal, and pasta. Unsweetened oatmeal. Quinoa. Brown or wild rice. Meats and other proteins Seafood. Poultry without skin. Lean cuts of poultry and beef. Tofu. Nuts. Seeds. Dairy Low-fat or fat-free dairy products such as milk, yogurt, and cheese. The items listed above may not be a complete list of foods and beverages you can eat. Contact a dietitian for more information. What foods should I avoid? Fruits Fruits canned with syrup. Vegetables Canned vegetables. Frozen vegetables with butter or cream sauce. Grains Refined white flour and flour products such as bread, pasta, snack foods, andcereals. Avoid all processed foods. Meats and other proteins Fatty cuts of meat. Poultry with skin. Breaded or fried meats. Processed meat.Avoid saturated fats. Dairy Full-fat yogurt, cheese, or milk. Beverages Sweetened drinks, such as soda or iced tea. The items listed above may not be a complete list of foods and beverages you should avoid. Contact a dietitian for more information. Questions to ask a health care provider Do I need to meet with a diabetes educator? Do I need to meet with a dietitian? What number can I call if I have questions? When are the best times to check my blood  glucose? Where to find more information: American Diabetes Association: diabetes.org Academy of Nutrition and Dietetics: www.eatright.[employer] of Diabetes and Digestive and Kidney Diseases: [employer].tn Association of Diabetes Care and Education Specialists: www.diabeteseducator.org Summary It is important to have healthy eating habits because your blood sugar (glucose) levels are greatly affected by what you eat and drink. A healthy meal plan will help you control your blood glucose and maintain a healthy lifestyle. Your health care provider may recommend that you work with a dietitian to make a meal plan that is best for you. Keep in mind that carbohydrates (carbs) and alcohol have immediate effects on your blood glucose levels. It is important to count carbs and to use alcohol carefully. This information is not intended to replace advice given to you by your health care provider. Make sure you discuss any questions you have with your healthcare provider. Document Revised: 03/19/2019 Document Reviewed: 03/19/2019 Elsevier Patient Education  2021 [employer].

## 2020-12-27 ENCOUNTER — Other Ambulatory Visit: Payer: Self-pay | Admitting: Family Medicine

## 2020-12-27 DIAGNOSIS — E1159 Type 2 diabetes mellitus with other circulatory complications: Secondary | ICD-10-CM

## 2020-12-27 DIAGNOSIS — I10 Essential (primary) hypertension: Secondary | ICD-10-CM

## 2020-12-27 NOTE — Telephone Encounter (Signed)
Requested Prescriptions  Pending Prescriptions Disp Refills  . metFORMIN (GLUCOPHAGE) 500 MG tablet [Pharmacy Med Name: metFORMIN HCl 500 MG Oral Tablet] 180 tablet 0    Sig: TAKE 1 TABLET BY MOUTH  TWICE DAILY     Endocrinology:  Diabetes - Biguanides Failed - 12/27/2020  4:18 AM      Failed - Cr in normal range and within 360 days    Creatinine  Date Value Ref Range Status  11/12/2012 0.99 0.60 - 1.30 mg/dL Final   Creatinine, Ser  Date Value Ref Range Status  09/08/2020 0.72 (L) 0.76 - 1.27 mg/dL Final         Failed - HBA1C is between 0 and 7.9 and within 180 days    Hgb A1c MFr Bld  Date Value Ref Range Status  09/08/2020 8.0 (H) 4.8 - 5.6 % Final    Comment:             Prediabetes: 5.7 - 6.4          Diabetes: >6.4          Glycemic control for adults with diabetes: <7.0          Failed - AA eGFR in normal range and within 360 days    EGFR (African American)  Date Value Ref Range Status  11/12/2012 >60  Final   GFR calc Af Amer  Date Value Ref Range Status  10/01/2019 90 >59 mL/min/1.73 Final    Comment:    **[facility] currently reports eGFR in compliance with the current**   recommendations of the [employer]. [facility] will   update reporting as new guidelines are published from the NKF-ASN   Task force.    EGFR (Non-African Amer.)  Date Value Ref Range Status  11/12/2012 >60  Final    Comment:    eGFR values <12mL/min/1.73 m2 may be an indication of chronic kidney disease (CKD). Calculated eGFR is useful in patients with stable renal function. The eGFR calculation will not be reliable in acutely ill patients when serum creatinine is changing rapidly. It is not useful in  patients on dialysis. The eGFR calculation may not be applicable to patients at the low and high extremes of body sizes, pregnant women, and vegetarians.    GFR calc non Af Amer  Date Value Ref Range Status  10/01/2019 78 >59 mL/min/1.73 Final   eGFR  Date Value Ref  Range Status  09/08/2020 89 >59 mL/min/1.73 Final         Passed - Valid encounter within last 6 months    Recent Outpatient Visits          3 months ago Type 2 diabetes mellitus with other circulatory complication, without long-term current use of insulin ([place])   [facility] Juline Patch, MD   7 months ago Type 2 diabetes mellitus with other circulatory complication, without long-term current use of insulin ([place])   [facility] Juline Patch, MD   8 months ago Type 2 diabetes mellitus with other circulatory complication, without long-term current use of insulin ([place])   [facility] Juline Patch, MD   11 months ago Type 2 diabetes mellitus with other circulatory complication, without long-term current use of insulin ([place])   [facility] Juline Patch, MD   1 year ago Amputated below knee, right [facility])   [facility] Juline Patch, MD      Future Appointments  In 2 weeks Juline Patch, MD [facility]           . metoprolol tartrate (LOPRESSOR) 50 MG tablet [Pharmacy Med Name: Metoprolol Tartrate 50 MG Oral Tablet] 180 tablet 0    Sig: TAKE 1 TABLET BY MOUTH  TWICE DAILY     Cardiovascular:  Beta Blockers Passed - 12/27/2020  4:18 AM      Passed - Last BP in normal range    BP Readings from Last 1 Encounters:  10/28/20 132/70         Passed - Last Heart Rate in normal range    Pulse Readings from Last 1 Encounters:  10/28/20 89         Passed - Valid encounter within last 6 months    Recent Outpatient Visits          3 months ago Type 2 diabetes mellitus with other circulatory complication, without long-term current use of insulin ([place])   [facility] Juline Patch, MD   7 months ago Type 2 diabetes mellitus with other circulatory complication, without long-term current use of insulin ([place])   [facility] Juline Patch, MD   8 months ago Type 2 diabetes mellitus with  other circulatory complication, without long-term current use of insulin ([place])   [facility] Juline Patch, MD   11 months ago Type 2 diabetes mellitus with other circulatory complication, without long-term current use of insulin ([place])   [facility] Juline Patch, MD   1 year ago Amputated below knee, right [facility])   [facility] Juline Patch, MD      Future Appointments            In 2 weeks Juline Patch, MD [facility], [facility]

## 2021-01-12 ENCOUNTER — Ambulatory Visit (INDEPENDENT_AMBULATORY_CARE_PROVIDER_SITE_OTHER): Payer: Medicare Other | Admitting: Family Medicine

## 2021-01-12 ENCOUNTER — Other Ambulatory Visit: Payer: Self-pay

## 2021-01-12 ENCOUNTER — Encounter: Payer: Self-pay | Admitting: Family Medicine

## 2021-01-12 VITALS — BP 138/87 | HR 68 | Ht 72.0 in | Wt 145.0 lb

## 2021-01-12 DIAGNOSIS — Z23 Encounter for immunization: Secondary | ICD-10-CM | POA: Diagnosis not present

## 2021-01-12 DIAGNOSIS — E1159 Type 2 diabetes mellitus with other circulatory complications: Secondary | ICD-10-CM | POA: Diagnosis not present

## 2021-01-12 MED ORDER — GLIPIZIDE ER 10 MG PO TB24: 10.0000 mg | ORAL_TABLET | Freq: Every day | ORAL | 1 refills | Status: DC

## 2021-01-12 MED ORDER — SITAGLIPTIN PHOSPHATE 100 MG PO TABS: 100.0000 mg | ORAL_TABLET | Freq: Every day | ORAL | 1 refills | Status: DC

## 2021-01-12 MED ORDER — METFORMIN HCL 500 MG PO TABS: 500.0000 mg | ORAL_TABLET | Freq: Two times a day (BID) | ORAL | 1 refills | Status: DC

## 2021-01-12 NOTE — Progress Notes (Signed)
Date:  01/12/2021   Name:  Jose Schroeder   DOB:  [birth year]   MRN:  [id number]   Chief Complaint: Diabetes, Flu Vaccine, and face to face eval (Home health)  Diabetes He presents for his follow-up diabetic visit. He has type 2 diabetes mellitus. His disease course has been stable. There are no hypoglycemic associated symptoms. Pertinent negatives for hypoglycemia include no dizziness, headaches or nervousness/anxiousness. Pertinent negatives for diabetes include no blurred vision, no chest pain, no fatigue, no foot paresthesias, no foot ulcerations, no polydipsia, no polyphagia, no polyuria, no visual change, no weakness and no weight loss. There are no hypoglycemic complications. Symptoms are stable. There are no diabetic complications. Risk factors for coronary artery disease include dyslipidemia. Current diabetic treatment includes oral agent (triple therapy). His weight is stable. He is following a generally healthy diet. An ACE inhibitor/angiotensin II receptor blocker is being taken.   Lab Results  Component Value Date   CREATININE 0.72 (L) 09/08/2020   BUN 12 09/08/2020   NA 140 09/08/2020   K 4.4 09/08/2020   CL 97 09/08/2020   CO2 27 09/08/2020   Lab Results  Component Value Date   CHOL 123 09/08/2020   HDL 48 09/08/2020   LDLCALC 59 09/08/2020   TRIG 82 09/08/2020   CHOLHDL 2.6 09/08/2020   No results found for: TSH Lab Results  Component Value Date   HGBA1C 8.0 (H) 09/08/2020   Lab Results  Component Value Date   WBC 6.2 05/17/2019   HGB 11.4 (L) 10/01/2019   HCT 33.2 (L) 05/17/2019   MCV 87 05/17/2019   PLT 260 05/17/2019   Lab Results  Component Value Date   ALT 7 05/17/2019   AST 17 05/17/2019   ALKPHOS 53 05/17/2019   BILITOT 0.3 05/17/2019     Review of Systems  Constitutional:  Negative for chills, fatigue, fever and weight loss.  HENT:  Negative for drooling, ear discharge, ear pain and sore throat.   Eyes:  Negative for blurred vision.   Respiratory:  Negative for cough, shortness of breath and wheezing.   Cardiovascular:  Negative for chest pain, palpitations and leg swelling.  Gastrointestinal:  Negative for abdominal pain, blood in stool, constipation, diarrhea and nausea.  Endocrine: Negative for polydipsia, polyphagia and polyuria.  Genitourinary:  Negative for dysuria, frequency, hematuria and urgency.  Musculoskeletal:  Negative for back pain, myalgias and neck pain.  Skin:  Negative for rash.  Allergic/Immunologic: Negative for environmental allergies.  Neurological:  Negative for dizziness, weakness and headaches.  Hematological:  Does not bruise/bleed easily.  Psychiatric/Behavioral:  Negative for suicidal ideas. The patient is not nervous/anxious.    Patient Active Problem List   Diagnosis Date Noted   Atherosclerosis of native arteries of extremity with intermittent claudication (HCC) 03/30/2020   BKA stump complication (HCC) 03/30/2020   Absolute anemia 10/01/2019   Familial hypercholesterolemia 10/01/2019   Acquired absence of right leg below knee (HCC) 09/13/2018   Amputated below knee, right (HCC) 05/09/2017   PVD (peripheral vascular disease) (HCC) 12/15/2016   Hx of BKA, 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   Ulcer of right foot (HCC) 01/31/2016   Amputated great toe (HCC) 01/11/2016   Toe infection 01/04/2016   Diabetes mellitus (HCC) 09/24/2015   Essential hypertension 09/24/2015    No Known Allergies  Past Surgical History:  Procedure Laterality Date   AMPUTATION TOE Right 01/15/2016   Procedure: AMPUTATION TOE;  Surgeon: Gwyneth Revels, DPM;  Location: [facility] ORS;  Service:  Podiatry;  Laterality: Right;   PERIPHERAL VASCULAR CATHETERIZATION Right 01/07/2016   Procedure: Abdominal Aortogram w/Lower Extremity;  Surgeon: Renford Dills, MD;  Location: [facility] IN[facility];  Service: Cardiovascular;  Laterality: Right;   TOE SURGERY     amputated 2 toes on L) foot    Social History   Tobacco Use    Smoking status: Never   Smokeless tobacco: Never   Tobacco comments:    Smoking cessation materials not required  Vaping Use   Vaping Use: Never used  Substance Use Topics   Alcohol use: No    Alcohol/week: 0.0 standard drinks   Drug use: No     Medication list has been reviewed and updated.  No outpatient medications have been marked as taking for the 01/12/21 encounter (Office Visit) with Duanne Limerick, MD.    [facility] 2/9 Scores 10/28/2020 12/05/2019 10/01/2019 09/30/2019  PHQ - 2 Score 0 0 0 0  PHQ- 9 Score - 0 0 -    GAD 7 : Generalized Anxiety Score 12/05/2019 10/01/2019  Nervous, Anxious, on Edge 0 0  Control/stop worrying 0 0  Worry too much - different things 0 0  Trouble relaxing 0 0  Restless 0 0  Easily annoyed or irritable 0 0  Afraid - awful might happen 0 0  Total GAD 7 Score 0 0  Anxiety Difficulty Not difficult at all -    BP Readings from Last 3 Encounters:  01/12/21 (!) 138/92  10/28/20 132/70  09/08/20 130/80    Physical Exam Vitals and nursing note reviewed.  HENT:     Head: Normocephalic.     Right Ear: Tympanic membrane and external ear normal.     Left Ear: Tympanic membrane and external ear normal.     Nose: Nose normal. No congestion or rhinorrhea.     Mouth/Throat:     Mouth: Mucous membranes are moist.  Eyes:     General: No scleral icterus.       Right eye: No discharge.        Left eye: No discharge.     Conjunctiva/sclera: Conjunctivae normal.     Pupils: Pupils are equal, round, and reactive to light.  Neck:     Thyroid: No thyromegaly.     Vascular: No JVD.     Trachea: No tracheal deviation.  Cardiovascular:     Rate and Rhythm: Normal rate and regular rhythm.     Heart sounds: Normal heart sounds. No murmur heard.   No friction rub. No gallop.  Pulmonary:     Effort: No respiratory distress.     Breath sounds: Normal breath sounds. No wheezing, rhonchi or rales.  Abdominal:     General: Bowel sounds are normal.     Palpations:  Abdomen is soft. There is no mass.     Tenderness: There is no abdominal tenderness. There is no guarding or rebound.  Musculoskeletal:        General: No tenderness. Normal range of motion.     Cervical back: Normal range of motion and neck supple.  Lymphadenopathy:     Cervical: No cervical adenopathy.  Skin:    General: Skin is warm.     Findings: No rash.  Neurological:     Mental Status: He is alert and oriented to person, place, and time.     Cranial Nerves: No cranial nerve deficit.     Deep Tendon Reflexes: Reflexes are normal and symmetric.    Wt Readings from  Last 3 Encounters:  01/12/21 145 lb (65.8 kg)  10/28/20 142 lb 3.2 oz (64.5 kg)  09/08/20 152 lb (68.9 kg)    BP (!) 138/92   Pulse 68   Ht 6' (1.829 m)   Wt 145 lb (65.8 kg)   BMI 19.67 kg/m   Assessment and Plan:  1. Type 2 diabetes mellitus with other circulatory complication, without long-term current use of insulin (HCC) Chronic.  Controlled.  Stable.  Continue Januvia 100 mg once a day, metformin 500 mg twice a day, and glipizide XL 10 mg once a day.  Will check A1c and microalbuminuria for current level of control. - HgB A1c - Microalbumin, urine - glipiZIDE (GLUCOTROL XL) 10 MG 24 hr tablet; Take 1 tablet (10 mg total) by mouth daily with breakfast.  Dispense: 90 tablet; Refill: 1 - metFORMIN (GLUCOPHAGE) 500 MG tablet; Take 1 tablet (500 mg total) by mouth 2 (two) times daily.  Dispense: 180 tablet; Refill: 1 - sitaGLIPtin (JANUVIA) 100 MG tablet; Take 1 tablet (100 mg total) by mouth daily.  Dispense: 90 tablet; Refill: 1   Family brought up [facility] program that the would like to investigate coming in to do baths and general cleaning perhaps cooking that I am not aware of that this is a home health program.  Patient does not qualify for home health and I recognize that he may need assistance with daily living activity but this may be a community based program that needs to be investigated.

## 2021-01-13 LAB — HEMOGLOBIN A1C
Est. average glucose Bld gHb Est-mCnc: 203 mg/dL
Hgb A1c MFr Bld: 8.7 % — ABNORMAL HIGH (ref 4.8–5.6)

## 2021-01-13 LAB — MICROALBUMIN, URINE: Microalbumin, Urine: 14.4 ug/mL

## 2021-01-14 NOTE — Addendum Note (Signed)
Addended by: Everitt Amber on: 01/14/2021 12:45 PM   Modules accepted: Orders

## 2021-02-10 ENCOUNTER — Emergency Department: Payer: Medicare Other

## 2021-02-10 ENCOUNTER — Encounter: Payer: Self-pay | Admitting: Emergency Medicine

## 2021-02-10 ENCOUNTER — Inpatient Hospital Stay
Admission: EM | Admit: 2021-02-10 | Discharge: 2021-02-13 | DRG: 871 | Disposition: A | Discharge status: Home Health | Payer: Medicare Other | Attending: Internal Medicine | Admitting: Internal Medicine

## 2021-02-10 ENCOUNTER — Other Ambulatory Visit: Payer: Self-pay

## 2021-02-10 DIAGNOSIS — I499 Cardiac arrhythmia, unspecified: Secondary | ICD-10-CM | POA: Diagnosis not present

## 2021-02-10 DIAGNOSIS — E871 Hypo-osmolality and hyponatremia: Secondary | ICD-10-CM | POA: Diagnosis present

## 2021-02-10 DIAGNOSIS — Z7984 Long term (current) use of oral hypoglycemic drugs: Secondary | ICD-10-CM | POA: Diagnosis not present

## 2021-02-10 DIAGNOSIS — Z89511 Acquired absence of right leg below knee: Secondary | ICD-10-CM | POA: Diagnosis not present

## 2021-02-10 DIAGNOSIS — E119 Type 2 diabetes mellitus without complications: Secondary | ICD-10-CM

## 2021-02-10 DIAGNOSIS — I739 Peripheral vascular disease, unspecified: Secondary | ICD-10-CM | POA: Diagnosis present

## 2021-02-10 DIAGNOSIS — R6889 Other general symptoms and signs: Secondary | ICD-10-CM | POA: Diagnosis not present

## 2021-02-10 DIAGNOSIS — D649 Anemia, unspecified: Secondary | ICD-10-CM | POA: Diagnosis not present

## 2021-02-10 DIAGNOSIS — F0390 Unspecified dementia without behavioral disturbance: Secondary | ICD-10-CM | POA: Diagnosis present

## 2021-02-10 DIAGNOSIS — D508 Other iron deficiency anemias: Secondary | ICD-10-CM | POA: Diagnosis not present

## 2021-02-10 DIAGNOSIS — E872 Acidosis, unspecified: Secondary | ICD-10-CM | POA: Diagnosis not present

## 2021-02-10 DIAGNOSIS — E1151 Type 2 diabetes mellitus with diabetic peripheral angiopathy without gangrene: Secondary | ICD-10-CM | POA: Diagnosis not present

## 2021-02-10 DIAGNOSIS — Z743 Need for continuous supervision: Secondary | ICD-10-CM | POA: Diagnosis not present

## 2021-02-10 DIAGNOSIS — Z89421 Acquired absence of other right toe(s): Secondary | ICD-10-CM

## 2021-02-10 DIAGNOSIS — Z8249 Family history of ischemic heart disease and other diseases of the circulatory system: Secondary | ICD-10-CM

## 2021-02-10 DIAGNOSIS — G9341 Metabolic encephalopathy: Secondary | ICD-10-CM | POA: Diagnosis present

## 2021-02-10 DIAGNOSIS — E785 Hyperlipidemia, unspecified: Secondary | ICD-10-CM | POA: Diagnosis not present

## 2021-02-10 DIAGNOSIS — I10 Essential (primary) hypertension: Secondary | ICD-10-CM | POA: Diagnosis present

## 2021-02-10 DIAGNOSIS — I4510 Unspecified right bundle-branch block: Secondary | ICD-10-CM | POA: Diagnosis not present

## 2021-02-10 DIAGNOSIS — R410 Disorientation, unspecified: Secondary | ICD-10-CM | POA: Diagnosis present

## 2021-02-10 DIAGNOSIS — R404 Transient alteration of awareness: Secondary | ICD-10-CM | POA: Diagnosis not present

## 2021-02-10 DIAGNOSIS — E876 Hypokalemia: Secondary | ICD-10-CM | POA: Diagnosis not present

## 2021-02-10 DIAGNOSIS — Z833 Family history of diabetes mellitus: Secondary | ICD-10-CM

## 2021-02-10 DIAGNOSIS — Z20822 Contact with and (suspected) exposure to covid-19: Secondary | ICD-10-CM | POA: Diagnosis present

## 2021-02-10 DIAGNOSIS — S88111A Complete traumatic amputation at level between knee and ankle, right lower leg, initial encounter: Secondary | ICD-10-CM | POA: Diagnosis present

## 2021-02-10 DIAGNOSIS — Z89512 Acquired absence of left leg below knee: Secondary | ICD-10-CM | POA: Diagnosis not present

## 2021-02-10 DIAGNOSIS — Z794 Long term (current) use of insulin: Secondary | ICD-10-CM

## 2021-02-10 DIAGNOSIS — Z79899 Other long term (current) drug therapy: Secondary | ICD-10-CM | POA: Diagnosis not present

## 2021-02-10 DIAGNOSIS — M25561 Pain in right knee: Secondary | ICD-10-CM | POA: Diagnosis not present

## 2021-02-10 DIAGNOSIS — Z7982 Long term (current) use of aspirin: Secondary | ICD-10-CM

## 2021-02-10 DIAGNOSIS — R52 Pain, unspecified: Secondary | ICD-10-CM

## 2021-02-10 DIAGNOSIS — Z8679 Personal history of other diseases of the circulatory system: Secondary | ICD-10-CM | POA: Diagnosis not present

## 2021-02-10 DIAGNOSIS — E1152 Type 2 diabetes mellitus with diabetic peripheral angiopathy with gangrene: Secondary | ICD-10-CM | POA: Diagnosis not present

## 2021-02-10 DIAGNOSIS — A419 Sepsis, unspecified organism: Secondary | ICD-10-CM | POA: Diagnosis not present

## 2021-02-10 DIAGNOSIS — D638 Anemia in other chronic diseases classified elsewhere: Secondary | ICD-10-CM | POA: Diagnosis present

## 2021-02-10 DIAGNOSIS — R4182 Altered mental status, unspecified: Secondary | ICD-10-CM

## 2021-02-10 LAB — CBC WITH DIFFERENTIAL/PLATELET
Abs Immature Granulocytes: 0.16 10*3/uL — ABNORMAL HIGH (ref 0.00–0.07)
Basophils Absolute: 0 10*3/uL (ref 0.0–0.1)
Basophils Relative: 0 %
Eosinophils Absolute: 0 10*3/uL (ref 0.0–0.5)
Eosinophils Relative: 0 %
HCT: 28.6 % — ABNORMAL LOW (ref 39.0–52.0)
Hemoglobin: 9.8 g/dL — ABNORMAL LOW (ref 13.0–17.0)
Immature Granulocytes: 1 %
Lymphocytes Relative: 5 %
Lymphs Abs: 0.9 10*3/uL (ref 0.7–4.0)
MCH: 30.9 pg (ref 26.0–34.0)
MCHC: 34.3 g/dL (ref 30.0–36.0)
MCV: 90.2 fL (ref 80.0–100.0)
Monocytes Absolute: 0.9 10*3/uL (ref 0.1–1.0)
Monocytes Relative: 5 %
Neutro Abs: 16.7 10*3/uL — ABNORMAL HIGH (ref 1.7–7.7)
Neutrophils Relative %: 89 %
Platelets: 185 10*3/uL (ref 150–400)
RBC: 3.17 MIL/uL — ABNORMAL LOW (ref 4.22–5.81)
RDW: 12 % (ref 11.5–15.5)
WBC: 18.7 10*3/uL — ABNORMAL HIGH (ref 4.0–10.5)
nRBC: 0 % (ref 0.0–0.2)

## 2021-02-10 LAB — URINALYSIS, COMPLETE (UACMP) WITH MICROSCOPIC
Bilirubin Urine: NEGATIVE
Glucose, UA: >=500 mg/dL — AB
Ketones, ur: NEGATIVE mg/dL
Leukocytes,Ua: NEGATIVE
Nitrite: NEGATIVE
Protein, ur: NEGATIVE mg/dL
Specific Gravity, Urine: 1.023 (ref 1.005–1.030)
pH: 5 (ref 5.0–8.0)

## 2021-02-10 LAB — COMPREHENSIVE METABOLIC PANEL
ALT: 21 U/L (ref 0–44)
AST: 28 U/L (ref 15–41)
Albumin: 4.1 g/dL (ref 3.5–5.0)
Alkaline Phosphatase: 45 U/L (ref 38–126)
Anion gap: 4 — ABNORMAL LOW (ref 5–15)
BUN: 16 mg/dL (ref 8–23)
CO2: 28 mmol/L (ref 22–32)
Calcium: 8.8 mg/dL — ABNORMAL LOW (ref 8.9–10.3)
Chloride: 100 mmol/L (ref 98–111)
Creatinine, Ser: 0.9 mg/dL (ref 0.61–1.24)
GFR, Estimated: >60 mL/min (ref >60)
Glucose, Bld: 382 mg/dL — ABNORMAL HIGH (ref 70–99)
Potassium: 3.8 mmol/L (ref 3.5–5.1)
Sodium: 132 mmol/L — ABNORMAL LOW (ref 135–145)
Total Bilirubin: 0.8 mg/dL (ref 0.3–1.2)
Total Protein: 7.2 g/dL (ref 6.5–8.1)

## 2021-02-10 LAB — RESP PANEL BY RT-PCR (FLU A&B, COVID) ARPGX2
Influenza A by PCR: NEGATIVE
Influenza B by PCR: NEGATIVE
SARS Coronavirus 2 by RT PCR: NEGATIVE

## 2021-02-10 LAB — CBC
HCT: 27.5 % — ABNORMAL LOW (ref 39.0–52.0)
Hemoglobin: 9.5 g/dL — ABNORMAL LOW (ref 13.0–17.0)
MCH: 30.9 pg (ref 26.0–34.0)
MCHC: 34.5 g/dL (ref 30.0–36.0)
MCV: 89.6 fL (ref 80.0–100.0)
Platelets: 183 10*3/uL (ref 150–400)
RBC: 3.07 MIL/uL — ABNORMAL LOW (ref 4.22–5.81)
RDW: 12.2 % (ref 11.5–15.5)
WBC: 18 10*3/uL — ABNORMAL HIGH (ref 4.0–10.5)
nRBC: 0 % (ref 0.0–0.2)

## 2021-02-10 LAB — LACTIC ACID, PLASMA
Lactic Acid, Venous: 2.6 mmol/L (ref 0.5–1.9)
Lactic Acid, Venous: 2.6 mmol/L (ref 0.5–1.9)

## 2021-02-10 LAB — CREATININE, SERUM
Creatinine, Ser: 0.8 mg/dL (ref 0.61–1.24)
GFR, Estimated: >60 mL/min (ref >60)

## 2021-02-10 LAB — CBG MONITORING, ED: Glucose-Capillary: 224 mg/dL — ABNORMAL HIGH (ref 70–99)

## 2021-02-10 MED ORDER — VANCOMYCIN HCL 1250 MG/250ML IV SOLN
1250.0000 mg | INTRAVENOUS | Status: DC
Start: 2021-02-11 — End: 2021-02-11
  Filled 2021-02-10: qty 250

## 2021-02-10 MED ORDER — ONDANSETRON HCL 4 MG/2ML IJ SOLN: 4.0000 mg | Freq: Four times a day (QID) | INTRAMUSCULAR | Status: DC | PRN

## 2021-02-10 MED ORDER — VANCOMYCIN HCL 1500 MG/300ML IV SOLN
1500.0000 mg | Freq: Once | INTRAVENOUS | Status: AC
  Administered 2021-02-10: 1500 mg via INTRAVENOUS
  Filled 2021-02-10: qty 300

## 2021-02-10 MED ORDER — INSULIN ASPART 100 UNIT/ML IJ SOLN
0.0000 [IU] | Freq: Every day | INTRAMUSCULAR | Status: DC
  Administered 2021-02-10 – 2021-02-11 (×2): 2 [IU] via SUBCUTANEOUS
  Filled 2021-02-10 (×2): qty 1

## 2021-02-10 MED ORDER — VANCOMYCIN HCL IN DEXTROSE 1-5 GM/200ML-% IV SOLN: 1000.0000 mg | Freq: Once | INTRAVENOUS | Status: DC

## 2021-02-10 MED ORDER — ONDANSETRON HCL 4 MG PO TABS: 4.0000 mg | ORAL_TABLET | Freq: Four times a day (QID) | ORAL | Status: DC | PRN

## 2021-02-10 MED ORDER — LACTATED RINGERS IV SOLN: INTRAVENOUS | Status: DC

## 2021-02-10 MED ORDER — VANCOMYCIN HCL IN DEXTROSE 1-5 GM/200ML-% IV SOLN
1000.0000 mg | Freq: Once | INTRAVENOUS | Status: DC
  Administered 2021-02-10: 1000 mg via INTRAVENOUS
  Filled 2021-02-10: qty 200

## 2021-02-10 MED ORDER — SODIUM CHLORIDE 0.9 % IV BOLUS
1000.0000 mL | Freq: Once | INTRAVENOUS | Status: AC
  Administered 2021-02-10: 1000 mL via INTRAVENOUS

## 2021-02-10 MED ORDER — SODIUM CHLORIDE 0.9 % IV SOLN
2.0000 g | Freq: Once | INTRAVENOUS | Status: AC
  Administered 2021-02-10: 2 g via INTRAVENOUS
  Filled 2021-02-10: qty 2

## 2021-02-10 MED ORDER — ACETAMINOPHEN 325 MG PO TABS: 650.0000 mg | ORAL_TABLET | Freq: Four times a day (QID) | ORAL | Status: DC | PRN

## 2021-02-10 MED ORDER — ACETAMINOPHEN 650 MG RE SUPP: 650.0000 mg | Freq: Four times a day (QID) | RECTAL | Status: DC | PRN

## 2021-02-10 MED ORDER — INSULIN ASPART 100 UNIT/ML IJ SOLN
0.0000 [IU] | Freq: Three times a day (TID) | INTRAMUSCULAR | Status: DC
  Administered 2021-02-12: 2 [IU] via SUBCUTANEOUS
  Administered 2021-02-12: 5 [IU] via SUBCUTANEOUS
  Administered 2021-02-12 – 2021-02-13 (×3): 8 [IU] via SUBCUTANEOUS
  Filled 2021-02-10 (×5): qty 1

## 2021-02-10 MED ORDER — SODIUM CHLORIDE 0.9 % IV SOLN: 2.0000 g | Freq: Once | INTRAVENOUS | Status: DC

## 2021-02-10 MED ORDER — METRONIDAZOLE 500 MG/100ML IV SOLN
500.0000 mg | Freq: Two times a day (BID) | INTRAVENOUS | Status: DC
Start: 2021-02-10 — End: 2021-02-12
  Administered 2021-02-11 (×3): 500 mg via INTRAVENOUS
  Filled 2021-02-10 (×4): qty 100

## 2021-02-10 MED ORDER — SODIUM CHLORIDE 0.9 % IV SOLN
2.0000 g | Freq: Two times a day (BID) | INTRAVENOUS | Status: DC
  Administered 2021-02-11: 2 g via INTRAVENOUS
  Filled 2021-02-10 (×2): qty 2

## 2021-02-10 MED ORDER — ENOXAPARIN SODIUM 40 MG/0.4ML IJ SOSY
40.0000 mg | PREFILLED_SYRINGE | INTRAMUSCULAR | Status: DC
  Administered 2021-02-11 – 2021-02-13 (×3): 40 mg via SUBCUTANEOUS
  Filled 2021-02-10 (×2): qty 0.4

## 2021-02-10 NOTE — H&P (Signed)
History and Physical   DYSON SEVEY WPV:[id number] DOB: 1932-09-23 DOA: 02/10/2021  Referring MD/NP/PA: Dr. Archie Balboa  PCP: Juline Patch, MD   Outpatient Specialists: None  Patient coming from: Home  Chief Complaint: Confusion  HPI: Jose Schroeder is a 87 y.o. male with medical history significant of diabetes, hypertension, hyperlipidemia, peripheral vascular disease status post right BKA, who lives at home and apparently was doing fine.  Patient was found today confused by family saying that he was trying to climb into his mailbox to get his mail.  This is quite unusual for him patient.  He was known to have mild memory impairment not sure if he has full diagnosis of dementia.  He was brought into the ER where he was still confused unable to give adequate history.  Patient has met sepsis criteria but without a identifiable source.  He therefore has SIRS with unknown source.  No fever or chills but has significant leukocytosis.  Also tachycardia as well as lactic acidosis.  He is COVID-19 negative.  Patient being admitted with sepsis of unknown source..  ED Course: Temperature 99.8, blood pressure 142/82, pulse 101 respirate of 19 oxygen sat 97% on room air.  White count is 18.7, hemoglobin 9.8 and platelets 185.  Sodium 132 potassium 3.8 chloride 100 CO2 28 BUN 16 creatinine 1.90 calcium 8.8.  Glucose is 382 and lactic acid 2.6.  Chest x-ray shows no acute findings.  Patient suspected to have sepsis of unknown source and is being admitted for further evaluation and treatment.  Review of Systems: As per HPI otherwise 10 point review of systems negative.    Past Medical History:  Diagnosis Date   Diabetes mellitus without complication ([place])    Hypertension    Peripheral vascular disease ([place])     Past Surgical History:  Procedure Laterality Date   AMPUTATION TOE Right 01/15/2016   Procedure: AMPUTATION TOE;  Surgeon: Samara Deist, DPM;  Location: [facility] ORS;  Service:  Podiatry;  Laterality: Right;   PERIPHERAL VASCULAR CATHETERIZATION Right 01/07/2016   Procedure: Abdominal Aortogram w/Lower Extremity;  Surgeon: Katha Cabal, MD;  Location: [place] [facility];  Service: Cardiovascular;  Laterality: Right;   TOE SURGERY     amputated 2 toes on L) foot     reports that he has never smoked. He has never used smokeless tobacco. He reports that he does not drink alcohol and does not use drugs.  No Known Allergies  Family History  Problem Relation Age of Onset   Diabetes Mother    Hypertension Mother    Diabetes Father    Hypertension Father    Diabetes Daughter      Prior to Admission medications   Medication Sig Start Date End Date Taking? Authorizing Provider  amLODipine (NORVASC) 10 MG tablet Take 1 tablet (10 mg total) by mouth daily. 09/08/20   Juline Patch, MD  aspirin EC 81 MG tablet Take 1 tablet (81 mg total) by mouth daily. 01/08/16   Demetrios Loll, MD  atorvastatin (LIPITOR) 20 MG tablet Take 1 tablet (20 mg total) by mouth daily. 09/08/20   Juline Patch, MD  ferrous sulfate 325 (65 FE) MG tablet Take 1 tablet (325 mg total) by mouth daily with breakfast. 04/13/20   Juline Patch, MD  gabapentin (NEURONTIN) 100 MG capsule Take 1 capsule (100 mg total) by mouth 3 (three) times daily. 08/24/20   Juline Patch, MD  glipiZIDE (GLUCOTROL XL) 10 MG 24 hr  tablet Take 1 tablet (10 mg total) by mouth daily with breakfast. 01/12/21   Juline Patch, MD  hydrochlorothiazide (HYDRODIURIL) 25 MG tablet Take 1 tablet (25 mg total) by mouth daily. 09/08/20   Juline Patch, MD  lisinopril (ZESTRIL) 10 MG tablet Take 1 tablet (10 mg total) by mouth daily. 09/08/20   Juline Patch, MD  metFORMIN (GLUCOPHAGE) 500 MG tablet Take 1 tablet (500 mg total) by mouth 2 (two) times daily. 01/12/21   Juline Patch, MD  metoprolol tartrate (LOPRESSOR) 50 MG tablet TAKE 1 TABLET BY MOUTH  TWICE DAILY 12/27/20   Juline Patch, MD  [facility] ULTRA test strip USE  AS DIRECTED BY  PHYSICIAN 12/03/19   Juline Patch, MD  sitaGLIPtin (JANUVIA) 100 MG tablet Take 1 tablet (100 mg total) by mouth daily. 01/12/21   Juline Patch, MD    Physical Exam: Vitals:   02/10/21 1742 02/10/21 2007  BP: (!) 142/82 (!) 161/93  Pulse: (!) 101 92  Resp: 17 19  Temp: 99.8 F (37.7 C)   TempSrc: Oral   SpO2: 97% 98%  Weight: 68 kg   Height: 6' (1.829 m)       Constitutional: Confused, pleasant, no distress Vitals:   02/10/21 1742 02/10/21 2007  BP: (!) 142/82 (!) 161/93  Pulse: (!) 101 92  Resp: 17 19  Temp: 99.8 F (37.7 C)   TempSrc: Oral   SpO2: 97% 98%  Weight: 68 kg   Height: 6' (1.829 m)    Eyes: PERRL, lids and conjunctivae normal ENMT: Mucous membranes are dry. Posterior pharynx clear of any exudate or lesions.Normal dentition.  Neck: normal, supple, no masses, no thyromegaly Respiratory: clear to auscultation bilaterally, no wheezing, no crackles. Normal respiratory effort. No accessory muscle use.  Cardiovascular: Sinus tachycardia, no murmurs / rubs / gallops. No extremity edema. 2+ pedal pulses. No carotid bruits.  Abdomen: no tenderness, no masses palpated. No hepatosplenomegaly. Bowel sounds positive.  Musculoskeletal: no clubbing / cyanosis.  Right BKA, good ROM, no contractures. Normal muscle tone.  Skin: no rashes, lesions, ulcers. No induration Neurologic: CN 2-12 grossly intact. Sensation intact, DTR normal. Strength 5/5 in all 4.  Psychiatric: Confused but answers simple questions   Labs on Admission: I have personally reviewed following labs and imaging studies  CBC: Recent Labs  Lab 02/10/21 1836  WBC 18.7*  NEUTROABS 16.7*  HGB 9.8*  HCT 28.6*  MCV 90.2  PLT 030   Basic Metabolic Panel: Recent Labs  Lab 02/10/21 1836  NA 132*  K 3.8  CL 100  CO2 28  GLUCOSE 382*  BUN 16  CREATININE 0.90  CALCIUM 8.8*   GFR: Estimated Creatinine Clearance: 56.7 mL/min (by C-G formula based on SCr of 0.9 mg/dL). Liver  Function Tests: Recent Labs  Lab 02/10/21 1836  AST 28  ALT 21  ALKPHOS 45  BILITOT 0.8  PROT 7.2  ALBUMIN 4.1   No results for input(s): LIPASE, AMYLASE in the last 168 hours. No results for input(s): AMMONIA in the last 168 hours. Coagulation Profile: No results for input(s): INR, PROTIME in the last 168 hours. Cardiac Enzymes: No results for input(s): CKTOTAL, CKMB, CKMBINDEX, TROPONINI in the last 168 hours. BNP (last 3 results) No results for input(s): PROBNP in the last 8760 hours. HbA1C: No results for input(s): HGBA1C in the last 72 hours. CBG: No results for input(s): GLUCAP in the last 168 hours. Lipid Profile: No results for input(s): CHOL, HDL, LDLCALC,  TRIG, CHOLHDL, LDLDIRECT in the last 72 hours. Thyroid Function Tests: No results for input(s): TSH, T4TOTAL, FREET4, T3FREE, THYROIDAB in the last 72 hours. Anemia Panel: No results for input(s): VITAMINB12, FOLATE, FERRITIN, TIBC, IRON, RETICCTPCT in the last 72 hours. Urine analysis:    Component Value Date/Time   COLORURINE YELLOW (A) 02/10/2021 1836   APPEARANCEUR CLEAR (A) 02/10/2021 1836   LABSPEC 1.023 02/10/2021 1836   PHURINE 5.0 02/10/2021 1836   GLUCOSEU >=500 (A) 02/10/2021 1836   HGBUR MODERATE (A) 02/10/2021 1836   BILIRUBINUR NEGATIVE 02/10/2021 1836   KETONESUR NEGATIVE 02/10/2021 1836   PROTEINUR NEGATIVE 02/10/2021 1836   NITRITE NEGATIVE 02/10/2021 1836   LEUKOCYTESUR NEGATIVE 02/10/2021 1836   Sepsis Labs: _0 (procalcitonin:4,lacticidven:4) ) Recent Results (from the past 240 hour(s))  Resp Panel by RT-PCR (Flu A&B, Covid) Nasopharyngeal Swab     Status: None   Collection Time: 02/10/21  7:50 PM   Specimen: Nasopharyngeal Swab; Nasopharyngeal(NP) swabs in vial transport medium  Result Value Ref Range Status   SARS Coronavirus 2 by RT PCR NEGATIVE NEGATIVE Final    Comment: (NOTE) SARS-CoV-2 target nucleic acids are NOT DETECTED.  The SARS-CoV-2 RNA is generally detectable  in upper respiratory specimens during the acute phase of infection. The lowest concentration of SARS-CoV-2 viral copies this assay can detect is 138 copies/mL. A negative result does not preclude SARS-Cov-2 infection and should not be used as the sole basis for treatment or other patient management decisions. A negative result may occur with  improper specimen collection/handling, submission of specimen other than nasopharyngeal swab, presence of viral mutation(s) within the areas targeted by this assay, and inadequate number of viral copies(<138 copies/mL). A negative result must be combined with clinical observations, patient history, and epidemiological information. The expected result is Negative.  Fact Sheet for Patients:  EntrepreneurPulse.com.au  Fact Sheet for Healthcare Providers:  IncredibleEmployment.be  This test is no t yet approved or cleared by the [place] FDA and  has been authorized for detection and/or diagnosis of SARS-CoV-2 by FDA under an Emergency Use Authorization (EUA). This EUA will remain  in effect (meaning this test can be used) for the duration of the COVID-19 declaration under Section 564(b)(1) of the Act, 21 U.S.C.section 360bbb-3(b)(1), unless the authorization is terminated  or revoked sooner.       Influenza A by PCR NEGATIVE NEGATIVE Final   Influenza B by PCR NEGATIVE NEGATIVE Final    Comment: (NOTE) The Xpert Xpress SARS-CoV-2/FLU/RSV plus assay is intended as an aid in the diagnosis of influenza from Nasopharyngeal swab specimens and should not be used as a sole basis for treatment. Nasal washings and aspirates are unacceptable for Xpert Xpress SARS-CoV-2/FLU/RSV testing.  Fact Sheet for Patients: EntrepreneurPulse.com.au  Fact Sheet for Healthcare Providers: IncredibleEmployment.be  This test is not yet approved or cleared by the [place] FDA and has  been authorized for detection and/or diagnosis of SARS-CoV-2 by FDA under an Emergency Use Authorization (EUA). This EUA will remain in effect (meaning this test can be used) for the duration of the COVID-19 declaration under Section 564(b)(1) of the Act, 21 U.S.C. section 360bbb-3(b)(1), unless the authorization is terminated or revoked.  Performed at [facility], [address] [place], [place] [address]      Radiological Exams on Admission: CT Head Wo Contrast  Result Date: 02/10/2021 CLINICAL DATA:  Altered mental status EXAM: CT HEAD WITHOUT CONTRAST TECHNIQUE: Contiguous axial images were obtained from the base of the skull through the  vertex without intravenous contrast. COMPARISON:  None. FINDINGS: Brain: There is atrophy and chronic small vessel disease changes. No acute intracranial abnormality. Specifically, no hemorrhage, hydrocephalus, mass lesion, acute infarction, or significant intracranial injury. Vascular: No hyperdense vessel or unexpected calcification. Skull: No acute calvarial abnormality. Sinuses/Orbits: No acute findings Other: None IMPRESSION: Atrophy, chronic microvascular disease. No acute intracranial abnormality. Electronically Signed   By: Rolm Baptise M.D.   On: 02/10/2021 18:49   DG Chest Portable 1 View  Result Date: 02/10/2021 CLINICAL DATA:  Altered mental status EXAM: PORTABLE CHEST 1 VIEW COMPARISON:  None. FINDINGS: Heart and mediastinal contours are within normal limits. No focal opacities or effusions. No acute bony abnormality. IMPRESSION: No active disease. Electronically Signed   By: Rolm Baptise M.D.   On: 02/10/2021 19:35      Assessment/Plan Principal Problem:   Sepsis ([place]) Active Problems:   Diabetes mellitus ([place])   Essential hypertension   PVD (peripheral vascular disease) ([place])   Amputated below knee, right ([place])   Anemia   Delirium   Hyponatremia     #1 sepsis of unknown source: Patient meets sepsis criteria with  leukocytosis, tachycardia, and respiratory rate.  Also has lactic acidosis.  We will treat him with IV vancomycin and cefepime for sepsis of unknown source.  Hydrate patient and monitor.  #2 acute delirium: Patient appears to have baseline dementia but now worsened.  Supportive care only and monitor.  #3 non-insulin-dependent diabetes: Sliding scale insulin initiated.  #4 hyperlipidemia: Confirm on resume statin.  #5 essential hypertension: Patient on lisinopril, metoprolol at home.  Confirm doses and resume  #6 peripheral vascular disease: Patient is status post right BKA.  Supportive care mainly.  #7 normocytic anemia: Most likely chronic disease.  Has had low hemoglobin for a while.  Outpatient work-up.  #8 hyponatremia: Possibly dehydration.  Hydrate and monitor   DVT prophylaxis: Lovenox Code Status: Full Family Communication: No family at bedside Disposition Plan: To be determined Consults called: Home Admission status: Inpatient  Severity of Illness: The appropriate patient status for this patient is OBSERVATION. Observation status is judged to be reasonable and necessary in order to provide the required intensity of service to ensure the patient's safety. The patient's presenting symptoms, physical exam findings, and initial radiographic and laboratory data in the context of their medical condition is felt to place them at decreased risk for further clinical deterioration. Furthermore, it is anticipated that the patient will be medically stable for discharge from the hospital within 2 midnights of admission.    Barbette Merino MD [facility] Pager [phone]   If 7PM-7AM, please contact night-coverage [email or web address] Password [email or web address]  02/10/2021, 10:15 PM

## 2021-02-10 NOTE — ED Provider Notes (Signed)
[facility] Emergency Department Provider Note   ____________________________________________   I have reviewed the triage vital signs and the nursing notes.   HISTORY  Chief Complaint Altered Mental Status   History limited by and level 5 caveat due to: AMS  HPI Kiyon R Risinger is a 87 y.o. male who presents to the emergency department today via EMS because of concern for altered mental status. The patient himself cannot give any meaningful history, states that he has "been here a long time and is ready to go" when asked questions.    Records reviewed. Per medical record review patient has a history of DM, HTN,  Past Medical History:  Diagnosis Date   Diabetes mellitus without complication (HCC)    Hypertension    Peripheral vascular disease (HCC)     Patient Active Problem List   Diagnosis Date Noted   Atherosclerosis of native arteries of extremity with intermittent claudication (HCC) 03/30/2020   BKA stump complication (HCC) 03/30/2020   Absolute anemia 10/01/2019   Familial hypercholesterolemia 10/01/2019   Acquired absence of right leg below knee (HCC) 09/13/2018   Amputated below knee, right (HCC) 05/09/2017   PVD (peripheral vascular disease) (HCC) 12/15/2016   Hx of BKA, 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   Ulcer of right foot (HCC) 01/31/2016   Amputated great toe (HCC) 01/11/2016   Toe infection 01/04/2016   Diabetes mellitus (HCC) 09/24/2015   Essential hypertension 09/24/2015    Past Surgical History:  Procedure Laterality Date   AMPUTATION TOE Right 01/15/2016   Procedure: AMPUTATION TOE;  Surgeon: Gwyneth Revels, DPM;  Location: [facility] ORS;  Service: Podiatry;  Laterality: Right;   PERIPHERAL VASCULAR CATHETERIZATION Right 01/07/2016   Procedure: Abdominal Aortogram w/Lower Extremity;  Surgeon: Renford Dills, MD;  Location: [facility] INVASIVE CV LAB;  Service: Cardiovascular;  Laterality: Right;   TOE SURGERY     amputated 2 toes on L) foot     Prior to Admission medications   Medication Sig Start Date End Date Taking? Authorizing Provider  amLODipine (NORVASC) 10 MG tablet Take 1 tablet (10 mg total) by mouth daily. 09/08/20   Duanne Limerick, MD  aspirin EC 81 MG tablet Take 1 tablet (81 mg total) by mouth daily. 01/08/16   Shaune Pollack, MD  atorvastatin (LIPITOR) 20 MG tablet Take 1 tablet (20 mg total) by mouth daily. 09/08/20   Duanne Limerick, MD  ferrous sulfate 325 (65 FE) MG tablet Take 1 tablet (325 mg total) by mouth daily with breakfast. 04/13/20   Duanne Limerick, MD  gabapentin (NEURONTIN) 100 MG capsule Take 1 capsule (100 mg total) by mouth 3 (three) times daily. 08/24/20   Duanne Limerick, MD  glipiZIDE (GLUCOTROL XL) 10 MG 24 hr tablet Take 1 tablet (10 mg total) by mouth daily with breakfast. 01/12/21   Duanne Limerick, MD  hydrochlorothiazide (HYDRODIURIL) 25 MG tablet Take 1 tablet (25 mg total) by mouth daily. 09/08/20   Duanne Limerick, MD  lisinopril (ZESTRIL) 10 MG tablet Take 1 tablet (10 mg total) by mouth daily. 09/08/20   Duanne Limerick, MD  metFORMIN (GLUCOPHAGE) 500 MG tablet Take 1 tablet (500 mg total) by mouth 2 (two) times daily. 01/12/21   Duanne Limerick, MD  metoprolol tartrate (LOPRESSOR) 50 MG tablet TAKE 1 TABLET BY MOUTH  TWICE DAILY 12/27/20   Duanne Limerick, MD  [facility] ULTRA test strip USE AS DIRECTED BY  PHYSICIAN 12/03/19   Duanne Limerick, MD  sitaGLIPtin (JANUVIA) 100 MG tablet Take 1 tablet (100 mg total) by mouth daily. 01/12/21   Duanne Limerick, MD    Allergies Patient has no known allergies.  Family History  Problem Relation Age of Onset   Diabetes Mother    Hypertension Mother    Diabetes Father    Hypertension Father    Diabetes Daughter     Social History Social History   Tobacco Use   Smoking status: Never   Smokeless tobacco: Never   Tobacco comments:    Smoking cessation materials not required  Vaping Use   Vaping Use: Never used  Substance Use Topics   Alcohol  use: No    Alcohol/week: 0.0 standard drinks   Drug use: No    Review of Systems Unable to obtain reliable ROS secondary to altered mental status.   ____________________________________________   PHYSICAL EXAM:  VITAL SIGNS: ED Triage Vitals [02/10/21 1742]  Enc Vitals Group     BP (!) 142/82     Pulse Rate (!) 101     Resp 17     Temp 99.8 F (37.7 C)     Temp Source Oral     SpO2 97 %     Weight 150 lb (68 kg)     Height 6' (1.829 m)   Constitutional: Awake, alert. Not oriented.  Eyes: Conjunctivae are normal.  ENT      Head: Normocephalic and atraumatic.      Nose: No congestion/rhinnorhea.      Mouth/Throat: Mucous membranes are moist.      Neck: No stridor. Hematological/Lymphatic/Immunilogical: No cervical lymphadenopathy. Cardiovascular: Normal rate, regular rhythm.  No murmurs, rubs, or gallops.  Respiratory: Normal respiratory effort without tachypnea nor retractions. Breath sounds are clear and equal bilaterally. No wheezes/rales/rhonchi. Gastrointestinal: Soft and non tender. No rebound. No guarding.  Genitourinary: Deferred Musculoskeletal: s/p right lower extremity amputation. Neurologic:  Not oriented. Moving all extremities.  Skin:  Skin is warm, dry and intact. No rash noted.  ____________________________________________    LABS (pertinent positives/negatives)  COVID negative CMP na 132, glu 382, ca 8.8, otherwise wnl Lactic acid 2.6 CBC wbc 18.7, hgb 9.8, plt 185  ____________________________________________   EKG  None  ____________________________________________    RADIOLOGY  CT head No acute abnormality  CXR No active disease  ____________________________________________   PROCEDURES  Procedures  ____________________________________________   INITIAL IMPRESSION / ASSESSMENT AND PLAN / ED COURSE  Pertinent labs & imaging results that were available during my care of the patient were reviewed by me and considered in  my medical decision making (see chart for details).   Patient presented to the emergency department today because of concerns for altered mental status.  Patient is unable to give any significant history.  Work-up here is concerning for with leukocytosis and elevated lactic acid level of possible infection.  However no clear source.  Chest x-ray and urine without findings concerning for infection.  Did not appreciate any skin ulcers.  Patient's abdomen was soft and benign.  Will have her start broad-spectrum antibiotics.  Will plan on admission to the hospital service.   ____________________________________________   FINAL CLINICAL IMPRESSION(S) / ED DIAGNOSES  Final diagnoses:  Altered mental status, unspecified altered mental status type     Note: This dictation was prepared with Dragon dictation. Any transcriptional errors that result from this process are unintentional     Phineas Semen, MD 02/10/21 2311

## 2021-02-10 NOTE — ED Triage Notes (Signed)
PT to ER from home via EMS with increased confusion.  Pt was found by family "outside trying to climb into his mailbox to get his mail."  Per EMS family reports some normal confusion, but today is acutely worse.  Pt is able to give name and follow simple commands.  Pt is disoriented to all else.  NAD noted at this time.

## 2021-02-10 NOTE — ED Notes (Signed)
Pt refuses to keep cardiac monitoring on and is trying to get out of bed; pt changed brief and sheets; pt now on bed alarm

## 2021-02-10 NOTE — Progress Notes (Signed)
Pharmacy Antibiotic Note  Jose Schroeder is a 87 y.o. male admitted on 02/10/2021 with suspected sepsis from unknown source.  Pharmacy has been consulted for Cefepime and Vancomycin dosing x 7 days.  Plan: Cefepime 2 gm q12h per indication and renal fxn.  Vancomycin Pt given initial dose of 1500  mg Vancomycin 1250 mg IV Q 24 hrs.  Goal AUC 400-550. Expected AUC: 496.4 SCr used: 0.9, TBW 68 kg < IBW 77.6  Pharmacy will continue to follow and will adjust dosing if warranted.  Height: 6' (182.9 cm) Weight: 68 kg (150 lb) IBW/kg (Calculated) : 77.6  Temp (24hrs), Avg:99.8 F (37.7 C), Min:99.8 F (37.7 C), Max:99.8 F (37.7 C)  Recent Labs  Lab 02/10/21 1836 02/10/21 1950 02/10/21 2051  WBC 18.7*  --   --   CREATININE 0.90  --   --   LATICACIDVEN  --  2.6* 2.6*    Estimated Creatinine Clearance: 56.7 mL/min (by C-G formula based on SCr of 0.9 mg/dL).    No Known Allergies  Antimicrobials this admission: 10/19 Vancomycin >> x 7 days 10/19 Cefepime >> x 7 days 10/20 Flagyl >> x 7 days  Microbiology results: 10/19 BCx: Pending  Thank you for allowing pharmacy to be a part of this patient's care.  Otelia Sergeant, PharmD, [facility] 02/10/2021 10:25 PM

## 2021-02-10 NOTE — ED Notes (Signed)
Report given to [facility] the tele-sitter for this pt.

## 2021-02-11 DIAGNOSIS — R410 Disorientation, unspecified: Secondary | ICD-10-CM

## 2021-02-11 DIAGNOSIS — E871 Hypo-osmolality and hyponatremia: Secondary | ICD-10-CM

## 2021-02-11 LAB — PROCALCITONIN
Procalcitonin: 9.1 ng/mL
Procalcitonin: 8.84 ng/mL

## 2021-02-11 LAB — COMPREHENSIVE METABOLIC PANEL
ALT: 17 U/L (ref 0–44)
ALT: 18 U/L (ref 0–44)
AST: 24 U/L (ref 15–41)
AST: 27 U/L (ref 15–41)
Albumin: 3.4 g/dL — ABNORMAL LOW (ref 3.5–5.0)
Albumin: 3.4 g/dL — ABNORMAL LOW (ref 3.5–5.0)
Alkaline Phosphatase: 38 U/L (ref 38–126)
Alkaline Phosphatase: 38 U/L (ref 38–126)
Anion gap: 8 (ref 5–15)
Anion gap: 7 (ref 5–15)
BUN: 12 mg/dL (ref 8–23)
BUN: 11 mg/dL (ref 8–23)
CO2: 27 mmol/L (ref 22–32)
CO2: 25 mmol/L (ref 22–32)
Calcium: 8.6 mg/dL — ABNORMAL LOW (ref 8.9–10.3)
Calcium: 8.3 mg/dL — ABNORMAL LOW (ref 8.9–10.3)
Chloride: 102 mmol/L (ref 98–111)
Chloride: 104 mmol/L (ref 98–111)
Creatinine, Ser: 0.76 mg/dL (ref 0.61–1.24)
Creatinine, Ser: 0.73 mg/dL (ref 0.61–1.24)
GFR, Estimated: >60 mL/min (ref >60)
GFR, Estimated: >60 mL/min (ref >60)
Glucose, Bld: 118 mg/dL — ABNORMAL HIGH (ref 70–99)
Glucose, Bld: 245 mg/dL — ABNORMAL HIGH (ref 70–99)
Potassium: 3.4 mmol/L — ABNORMAL LOW (ref 3.5–5.1)
Potassium: 3.8 mmol/L (ref 3.5–5.1)
Sodium: 137 mmol/L (ref 135–145)
Sodium: 136 mmol/L (ref 135–145)
Total Bilirubin: 1 mg/dL (ref 0.3–1.2)
Total Bilirubin: 0.9 mg/dL (ref 0.3–1.2)
Total Protein: 6.2 g/dL — ABNORMAL LOW (ref 6.5–8.1)
Total Protein: 6.3 g/dL — ABNORMAL LOW (ref 6.5–8.1)

## 2021-02-11 LAB — CBC
HCT: 28 % — ABNORMAL LOW (ref 39.0–52.0)
Hemoglobin: 9.6 g/dL — ABNORMAL LOW (ref 13.0–17.0)
MCH: 30.6 pg (ref 26.0–34.0)
MCHC: 34.3 g/dL (ref 30.0–36.0)
MCV: 89.2 fL (ref 80.0–100.0)
Platelets: 188 10*3/uL (ref 150–400)
RBC: 3.14 MIL/uL — ABNORMAL LOW (ref 4.22–5.81)
RDW: 12.1 % (ref 11.5–15.5)
WBC: 14.6 10*3/uL — ABNORMAL HIGH (ref 4.0–10.5)
nRBC: 0 % (ref 0.0–0.2)

## 2021-02-11 LAB — GLUCOSE, CAPILLARY
Glucose-Capillary: 109 mg/dL — ABNORMAL HIGH (ref 70–99)
Glucose-Capillary: 120 mg/dL — ABNORMAL HIGH (ref 70–99)
Glucose-Capillary: 105 mg/dL — ABNORMAL HIGH (ref 70–99)
Glucose-Capillary: 239 mg/dL — ABNORMAL HIGH (ref 70–99)

## 2021-02-11 LAB — SURGICAL PCR SCREEN
MRSA, PCR: NEGATIVE
Staphylococcus aureus: NEGATIVE

## 2021-02-11 LAB — PROTIME-INR
INR: 1.4 — ABNORMAL HIGH (ref 0.8–1.2)
Prothrombin Time: 17.1 seconds — ABNORMAL HIGH (ref 11.4–15.2)

## 2021-02-11 LAB — MAGNESIUM: Magnesium: 1.3 mg/dL — ABNORMAL LOW (ref 1.7–2.4)

## 2021-02-11 LAB — LACTIC ACID, PLASMA
Lactic Acid, Venous: 1.5 mmol/L (ref 0.5–1.9)
Lactic Acid, Venous: 1.3 mmol/L (ref 0.5–1.9)

## 2021-02-11 LAB — CORTISOL-AM, BLOOD: Cortisol - AM: 14.5 ug/dL (ref 6.7–22.6)

## 2021-02-11 LAB — PHOSPHORUS: Phosphorus: 2.4 mg/dL — ABNORMAL LOW (ref 2.5–4.6)

## 2021-02-11 MED ORDER — SODIUM CHLORIDE 0.9 % IV SOLN
2.0000 g | Freq: Three times a day (TID) | INTRAVENOUS | Status: DC
  Administered 2021-02-11 – 2021-02-13 (×5): 2 g via INTRAVENOUS
  Filled 2021-02-11 (×9): qty 2

## 2021-02-11 MED ORDER — AMLODIPINE BESYLATE 10 MG PO TABS
10.0000 mg | ORAL_TABLET | Freq: Every day | ORAL | Status: DC
  Administered 2021-02-11 – 2021-02-13 (×3): 10 mg via ORAL
  Filled 2021-02-11 (×3): qty 1

## 2021-02-11 MED ORDER — FERROUS SULFATE 325 (65 FE) MG PO TABS
325.0000 mg | ORAL_TABLET | Freq: Every day | ORAL | Status: DC
  Administered 2021-02-12 – 2021-02-13 (×2): 325 mg via ORAL
  Filled 2021-02-11 (×2): qty 1

## 2021-02-11 MED ORDER — ATORVASTATIN CALCIUM 20 MG PO TABS
20.0000 mg | ORAL_TABLET | Freq: Every day | ORAL | Status: DC
  Administered 2021-02-11 – 2021-02-12 (×2): 20 mg via ORAL
  Filled 2021-02-11 (×2): qty 1

## 2021-02-11 MED ORDER — LISINOPRIL 10 MG PO TABS
10.0000 mg | ORAL_TABLET | Freq: Every day | ORAL | Status: DC
  Administered 2021-02-12 – 2021-02-13 (×2): 10 mg via ORAL
  Filled 2021-02-11 (×2): qty 1

## 2021-02-11 MED ORDER — ASPIRIN EC 81 MG PO TBEC
81.0000 mg | DELAYED_RELEASE_TABLET | Freq: Every day | ORAL | Status: DC
  Administered 2021-02-11 – 2021-02-13 (×3): 81 mg via ORAL
  Filled 2021-02-11 (×3): qty 1

## 2021-02-11 MED ORDER — VANCOMYCIN HCL 1500 MG/300ML IV SOLN
1500.0000 mg | INTRAVENOUS | Status: DC
Start: 2021-02-11 — End: 2021-02-11
  Filled 2021-02-11: qty 300

## 2021-02-11 MED ORDER — MAGNESIUM SULFATE 4 GM/100ML IV SOLN
4.0000 g | Freq: Once | INTRAVENOUS | Status: AC
  Administered 2021-02-11: 4 g via INTRAVENOUS
  Filled 2021-02-11: qty 100

## 2021-02-11 MED ORDER — METOPROLOL TARTRATE 50 MG PO TABS
50.0000 mg | ORAL_TABLET | Freq: Two times a day (BID) | ORAL | Status: DC
  Administered 2021-02-11 – 2021-02-13 (×4): 50 mg via ORAL
  Filled 2021-02-11 (×4): qty 1

## 2021-02-11 MED ORDER — POTASSIUM CHLORIDE CRYS ER 20 MEQ PO TBCR
40.0000 meq | EXTENDED_RELEASE_TABLET | ORAL | Status: AC
  Administered 2021-02-11 (×2): 40 meq via ORAL
  Filled 2021-02-11 (×2): qty 2

## 2021-02-11 NOTE — Progress Notes (Signed)
PROGRESS NOTE    Jose Schroeder  XBJ:[id number] DOB: 1932/12/23 DOA: 02/10/2021 PCP: Juline Patch, MD   Brief Narrative:  The patient is an 87 year old elderly African-American male with past medical history significant for but not limited to diabetes mellitus type 2, hypertension, hyperlipidemia, peripheral vascular disease status post right BKA as well as other comorbidities who lives at home and is currently doing fine but he was found confused by his family saying that he is try to climb to his mailbox and get his mail.  This is very unusual for the patient and he is known to have a mild memory impairment and not sure of a full diagnosis of dementia but he is brought to the ED he still confused and unable to give adequate history.  He met sepsis criteria without any identifiable source.  He had no fevers or chills but did have a significant leukocytosis and also tachycardia as well as lactic acidosis.  His COVID-19 testing was negative and he was admitted for sepsis with unclear source.  Chest x-ray showed no acute findings.  Assessment & Plan:   Principal Problem:   Sepsis ([name]) Active Problems:   Diabetes mellitus ([place])   Essential hypertension   PVD (peripheral vascular disease) ([place])   Amputated below knee, right ([facility])   Anemia   Delirium   Hyponatremia  Sepsis Secondary to unclear etiology and source -Presented with a WBC of 18.7 he was tachycardic and tachypneic without a clear source of infection but he did have a lactic acidosis and is trended down and resolved -Procalcitonin level was 9.10 and trended down to 8.84 -Urinalysis was relatively unremarkable and was negative for ketones, leukocytes but did show rare bacteria and 0-5 squamous epithelial cells and 0-5 RBCs per high-powered field as well as 0-5 WBCs -Blood cultures x2 pending -WBC went from 18.7 -> 18.0 -> 14.6 -Chest x-ray showed no active disease -We will follow-up on cultures and patient was empirically  placed on antibiotics with IV vancomycin, IV cefepime, IV Flagyl and we will now stop IV vancomycin given the patient is MRSA negative -Patient received a 1 L sodium chloride bolus and then was placed on maintenance IV fluids with lactated Ringer's at 100 MLS per hour which we will continue throughout today and reduced to 75 MLS per hour  Hyponatremia -Mild and improved with IV fluid hydration -Na+ went from 132 -> 137 -Continue to Monitor and Trend -Repeat CBC in the AM  Acute delirium and encephalopathy superimposed on baseline dementia -Mental status is improving -Head CT done and showed "Atrophy, chronic microvascular disease. No acute intracranial abnormality." -Continue with IV fluid hydration as above -Continue supportive care and will need PT OT to further evaluate and treat -Placed on delirium precautions  Hypomagnesemia -Patient's mag level was 1.3 -Replete with IV mag sulfate 4 g -Continue to monitor and replete as necessary -Repeat magnesium level in a.m.  Hypokalemia -Patient's potassium was 3.4 -Replete with p.o. KCl 40 close twice daily x2 doses and repeat magnesium level as above -Continue to monitor and replete as necessary -Repeat CMP in a.m.  Non-insulin-dependent diabetes mellitus type 2 -Patient was initiated on moderate NovoLog/scale insulin before meals and at bedtime -Continue to hold his home medications including glipizide 10 mg p.o. daily with breakfast, metformin 500 mg p.o. twice daily, sitagliptin 100 mg p.o. daily -CBGs ranged from 105-224  Normocytic Anemia -Patient's hemoglobin/hematocrit went from 9.8/28.6 -> 9.5/27.5 -> 9.6/28.0 -Continue with ferrous sulfate 325 mg p.o. daily with  breakfast -Check Anemia Panel in the AM -Continue to Monitor for S/Sx of Bleeding -Repeat CBC in the AM   HLD -C/w Atorvastatin 20 mg p.o. daily  HTN -C/w amlodipine 10 mg p.o. daily, lisinopril 10 mg p.o. daily, metoprolol tartrate 50 g p.o. twice daily and  will hold his hydrochlorothiazide 25 mg p.o. daily given that he is getting IV fluid hydration -Continue to monitor blood pressures per protocol  PVD -Patient is s/p Right BKA  -Resume aspirin 81 mg p.o. daily as well as atorvastatin 20 g p.o. daily  DVT prophylaxis: Enoxaparin 40 mg sq q24h Code Status: FULL CODE  Family Communication: No family present at bedside  Disposition Plan: Ending further clinical improvement and he will need evaluation by PT OT  Status is: Inpatient  Remains inpatient appropriate because: Unclear source of infection and confusion need to be improved.  We will need PT and OT to evaluate for safe discharge disposition  Consultants:  None  Procedures: None  Antimicrobials:  Anti-infectives (From admission, onward)    Start     Dose/Rate Route Frequency Ordered Stop   02/11/21 2200  vancomycin (VANCOREADY) IVPB 1250 mg/250 mL  Status:  Discontinued        1,250 mg 166.7 mL/hr over 90 Minutes Intravenous Every 24 hours 02/10/21 2310 02/11/21 1413   02/11/21 2200  ceFEPIme (MAXIPIME) 2 g in sodium chloride 0.9 % 100 mL IVPB        2 g 200 mL/hr over 30 Minutes Intravenous Every 8 hours 02/11/21 1413     02/11/21 2000  vancomycin (VANCOREADY) IVPB 1500 mg/300 mL  Status:  Discontinued        1,500 mg 150 mL/hr over 120 Minutes Intravenous Every 24 hours 02/11/21 1413 02/11/21 1432   02/11/21 1000  ceFEPIme (MAXIPIME) 2 g in sodium chloride 0.9 % 100 mL IVPB  Status:  Discontinued        2 g 200 mL/hr over 30 Minutes Intravenous Every 12 hours 02/10/21 2228 02/11/21 1413   02/10/21 2315  ceFEPIme (MAXIPIME) 2 g in sodium chloride 0.9 % 100 mL IVPB  Status:  Discontinued        2 g 200 mL/hr over 30 Minutes Intravenous  Once 02/10/21 2300 02/10/21 2309   02/10/21 2315  metroNIDAZOLE (FLAGYL) IVPB 500 mg        500 mg 100 mL/hr over 60 Minutes Intravenous Every 12 hours 02/10/21 2300 02/17/21 2314   02/10/21 2315  vancomycin (VANCOCIN) IVPB 1000 mg/200 mL  premix  Status:  Discontinued        1,000 mg 200 mL/hr over 60 Minutes Intravenous  Once 02/10/21 2300 02/10/21 2309   02/10/21 2230  vancomycin (VANCOREADY) IVPB 1500 mg/300 mL        1,500 mg 150 mL/hr over 120 Minutes Intravenous  Once 02/10/21 2215 02/11/21 0104   02/10/21 2115  vancomycin (VANCOCIN) IVPB 1000 mg/200 mL premix  Status:  Discontinued        1,000 mg 200 mL/hr over 60 Minutes Intravenous  Once 02/10/21 2114 02/10/21 2321   02/10/21 2115  ceFEPIme (MAXIPIME) 2 g in sodium chloride 0.9 % 100 mL IVPB        2 g 200 mL/hr over 30 Minutes Intravenous  Once 02/10/21 2114 02/10/21 2155        Subjective: Seen and examined at bedside and states that he is doing okay.  Denies chest pain or shortness of breath.  Was a little confused and wanted to  sleep.  No lightheadedness or dizziness.  Denies any burning or discomfort in his urine and no chest pain or shortness of breath.  Objective: Vitals:   02/11/21 0316 02/11/21 0759 02/11/21 1151 02/11/21 1516  BP: (!) 156/85 127/81 137/62 (!) 146/70  Pulse: 79 72 80 67  Resp: _0 Temp:  98 F (36.7 C) 98.7 F (37.1 C) 97.8 F (36.6 C)  TempSrc:  Oral Oral   SpO2: 98% 100% 100% 100%  Weight:      Height:        Intake/Output Summary (Last 24 hours) at 02/11/2021 1700 Last data filed at 02/11/2021 1602 Gross per 24 hour  Intake 1899.5 ml  Output 350 ml  Net 1549.5 ml   Filed Weights   02/10/21 1742  Weight: 68 kg   Examination: Physical Exam:  Constitutional: Thin chronically ill-appearing African-American male currently in no acute distress wanting to rest Eyes: Lids and conjunctivae normal, sclerae anicteric  ENMT: External Ears, Nose appear normal. Grossly normal hearing. Mucous membranes are moist.  Neck: Appears normal, supple, no cervical masses, normal ROM, no appreciable thyromegaly; no appreciable JVD Respiratory: Diminished to auscultation bilaterally, no wheezing, rales, rhonchi or crackles.  Normal respiratory effort and patient is not tachypenic. No accessory muscle use.  Cardiovascular: RRR, no murmurs / rubs / gallops. S1 and S2 auscultated. No extremity edema.  Abdomen: Soft, non-tender, non-distended. Bowel sounds positive.  GU: Deferred. Musculoskeletal: No clubbing / cyanosis of digits/nails.  Has a right BKA Skin: No rashes, lesions, ulcers. No induration; Warm and dry.  Neurologic: CN 2-12 grossly intact with no focal deficits. Romberg sign and cerebellar reflexes not assessed.  Psychiatric: Slightly impaired judgment and insight. Alert and oriented x 2 Normal mood and appropriate affect.   Data Reviewed: I have personally reviewed following labs and imaging studies  CBC: Recent Labs  Lab 02/10/21 1836 02/10/21 2327 02/11/21 0559  WBC 18.7* 18.0* 14.6*  NEUTROABS 16.7*  --   --   HGB 9.8* 9.5* 9.6*  HCT 28.6* 27.5* 28.0*  MCV 90.2 89.6 89.2  PLT 185 183 754   Basic Metabolic Panel: Recent Labs  Lab 02/10/21 1836 02/10/21 2327 02/11/21 0900  NA 132*  --  137  K 3.8  --  3.4*  CL 100  --  102  CO2 28  --  27  GLUCOSE 382*  --  118*  BUN 16  --  12  CREATININE 0.90 0.80 0.76  CALCIUM 8.8*  --  8.6*  MG  --   --  1.3*  PHOS  --   --  2.4*   GFR: Estimated Creatinine Clearance: 63.8 mL/min (by C-G formula based on SCr of 0.76 mg/dL). Liver Function Tests: Recent Labs  Lab 02/10/21 1836 02/11/21 0900  AST 28 24  ALT 21 17  ALKPHOS 45 38  BILITOT 0.8 1.0  PROT 7.2 6.2*  ALBUMIN 4.1 3.4*   No results for input(s): LIPASE, AMYLASE in the last 168 hours. No results for input(s): AMMONIA in the last 168 hours. Coagulation Profile: Recent Labs  Lab 02/11/21 0559  INR 1.4*   Cardiac Enzymes: No results for input(s): CKTOTAL, CKMB, CKMBINDEX, TROPONINI in the last 168 hours. BNP (last 3 results) No results for input(s): PROBNP in the last 8760 hours. HbA1C: No results for input(s): HGBA1C in the last 72 hours. CBG: Recent Labs  Lab  02/10/21 2335 02/11/21 0846 02/11/21 1117 02/11/21 1554  GLUCAP 224* 109* 120* 105*  Lipid Profile: No results for input(s): CHOL, HDL, LDLCALC, TRIG, CHOLHDL, LDLDIRECT in the last 72 hours. Thyroid Function Tests: No results for input(s): TSH, T4TOTAL, FREET4, T3FREE, THYROIDAB in the last 72 hours. Anemia Panel: No results for input(s): VITAMINB12, FOLATE, FERRITIN, TIBC, IRON, RETICCTPCT in the last 72 hours. Sepsis Labs: Recent Labs  Lab 02/10/21 1950 02/10/21 2051 02/11/21 0559 02/11/21 0900 02/11/21 1138  PROCALCITON  --   --  9.10 8.84  --   LATICACIDVEN 2.6* 2.6*  --  1.5 1.3    Recent Results (from the past 240 hour(s))  Resp Panel by RT-PCR (Flu A&B, Covid) Nasopharyngeal Swab     Status: None   Collection Time: 02/10/21  7:50 PM   Specimen: Nasopharyngeal Swab; Nasopharyngeal(NP) swabs in vial transport medium  Result Value Ref Range Status   SARS Coronavirus 2 by RT PCR NEGATIVE NEGATIVE Final    Comment: (NOTE) SARS-CoV-2 target nucleic acids are NOT DETECTED.  The SARS-CoV-2 RNA is generally detectable in upper respiratory specimens during the acute phase of infection. The lowest concentration of SARS-CoV-2 viral copies this assay can detect is 138 copies/mL. A negative result does not preclude SARS-Cov-2 infection and should not be used as the sole basis for treatment or other patient management decisions. A negative result may occur with  improper specimen collection/handling, submission of specimen other than nasopharyngeal swab, presence of viral mutation(s) within the areas targeted by this assay, and inadequate number of viral copies(<138 copies/mL). A negative result must be combined with clinical observations, patient history, and epidemiological information. The expected result is Negative.  Fact Sheet for Patients:  EntrepreneurPulse.com.au  Fact Sheet for Healthcare Providers:   IncredibleEmployment.be  This test is no t yet approved or cleared by the [place] FDA and  has been authorized for detection and/or diagnosis of SARS-CoV-2 by FDA under an Emergency Use Authorization (EUA). This EUA will remain  in effect (meaning this test can be used) for the duration of the COVID-19 declaration under Section 564(b)(1) of the Act, 21 U.S.C.section 360bbb-3(b)(1), unless the authorization is terminated  or revoked sooner.       Influenza A by PCR NEGATIVE NEGATIVE Final   Influenza B by PCR NEGATIVE NEGATIVE Final    Comment: (NOTE) The Xpert Xpress SARS-CoV-2/FLU/RSV plus assay is intended as an aid in the diagnosis of influenza from Nasopharyngeal swab specimens and should not be used as a sole basis for treatment. Nasal washings and aspirates are unacceptable for Xpert Xpress SARS-CoV-2/FLU/RSV testing.  Fact Sheet for Patients: EntrepreneurPulse.com.au  Fact Sheet for Healthcare Providers: IncredibleEmployment.be  This test is not yet approved or cleared by the [place] FDA and has been authorized for detection and/or diagnosis of SARS-CoV-2 by FDA under an Emergency Use Authorization (EUA). This EUA will remain in effect (meaning this test can be used) for the duration of the COVID-19 declaration under Section 564(b)(1) of the Act, 21 U.S.C. section 360bbb-3(b)(1), unless the authorization is terminated or revoked.  Performed at [facility], [address] [place], [place] [address]   Blood culture (routine x 2)     Status: None (Preliminary result)   Collection Time: 02/10/21  8:50 PM   Specimen: BLOOD  Result Value Ref Range Status   Specimen Description BLOOD LEFT ANTECUBITAL  Final   Special Requests   Final    BOTTLES DRAWN AEROBIC AND ANAEROBIC Blood Culture results may not be optimal due to an inadequate volume of blood received in culture bottles  Culture   Final     NO GROWTH < 12 HOURS Performed at [facility], [address] [place], [place] [address]    Report Status PENDING  Incomplete  Blood culture (routine x 2)     Status: None (Preliminary result)   Collection Time: 02/10/21  8:50 PM   Specimen: BLOOD  Result Value Ref Range Status   Specimen Description BLOOD BLOOD LEFT HAND  Final   Special Requests   Final    BOTTLES DRAWN AEROBIC ONLY Blood Culture results may not be optimal due to an inadequate volume of blood received in culture bottles   Culture   Final    NO GROWTH < 12 HOURS Performed at [facility], [address], [place], [place] [address]    Report Status PENDING  Incomplete  Surgical pcr screen     Status: None   Collection Time: 02/11/21 11:20 AM   Specimen: Nasal Mucosa; Nasal Swab  Result Value Ref Range Status   MRSA, PCR NEGATIVE NEGATIVE Final   Staphylococcus aureus NEGATIVE NEGATIVE Final    Comment: (NOTE) The Xpert SA Assay (FDA approved for NASAL specimens in patients 46 years of age and older), is one component of a comprehensive surveillance program. It is not intended to diagnose infection nor to guide or monitor treatment. Performed at [facility], [address] [place], [place] [address]     RN Pressure Injury Documentation:     Estimated body mass index is 20.34 kg/m as calculated from the following:   Height as of this encounter: 6' (1.829 m).   Weight as of this encounter: 68 kg.  Malnutrition Type:   Malnutrition Characteristics:   Nutrition Interventions:    Radiology Studies: CT Head Wo Contrast  Result Date: 02/10/2021 CLINICAL DATA:  Altered mental status EXAM: CT HEAD WITHOUT CONTRAST TECHNIQUE: Contiguous axial images were obtained from the base of the skull through the vertex without intravenous contrast. COMPARISON:  None. FINDINGS: Brain: There is atrophy and chronic small vessel disease changes. No acute intracranial abnormality.  Specifically, no hemorrhage, hydrocephalus, mass lesion, acute infarction, or significant intracranial injury. Vascular: No hyperdense vessel or unexpected calcification. Skull: No acute calvarial abnormality. Sinuses/Orbits: No acute findings Other: None IMPRESSION: Atrophy, chronic microvascular disease. No acute intracranial abnormality. Electronically Signed   By: Rolm Baptise M.D.   On: 02/10/2021 18:49   DG Chest Portable 1 View  Result Date: 02/10/2021 CLINICAL DATA:  Altered mental status EXAM: PORTABLE CHEST 1 VIEW COMPARISON:  None. FINDINGS: Heart and mediastinal contours are within normal limits. No focal opacities or effusions. No acute bony abnormality. IMPRESSION: No active disease. Electronically Signed   By: Rolm Baptise M.D.   On: 02/10/2021 19:35    Scheduled Meds:  enoxaparin (LOVENOX) injection  40 mg Subcutaneous Q24H   insulin aspart  0-15 Units Subcutaneous TID WC   insulin aspart  0-5 Units Subcutaneous QHS   Continuous Infusions:  ceFEPime (MAXIPIME) IV     lactated ringers 100 mL/hr at 02/11/21 1554   metronidazole Stopped (02/11/21 1159)    LOS: 1 day   Kerney Elbe, DO [facility] PAGER is on [place]  If 7PM-7AM, please contact night-coverage [email or web address]

## 2021-02-11 NOTE — Progress Notes (Signed)
Pharmacy Antibiotic Note  Jose Schroeder is a 87 y.o. male admitted on 02/10/2021 with suspected sepsis from unknown source.  Pharmacy has been consulted for Cefepime and Vancomycin dosing x 7 days.  Patient's renal function is back at baseline(SCR 0.9>0.76). Will adjust antibiotic doses as such.  Plan: Cefepime 2 gm q8h per indication and renal fxn.  Vancomycin Will adjust dose to Vancomycin 1500 mg IV Q 24 hrs.  Goal AUC 400-550. Expected AUC: 534.6 Expected Css: 11.3 SCr used: 0.8(actual: 0.76, TBW 68 kg < IBW 77.6  Pharmacy will continue to follow and will adjust dosing if warranted.  Height: 6' (182.9 cm) Weight: 68 kg (150 lb) IBW/kg (Calculated) : 77.6  Temp (24hrs), Avg:98.8 F (37.1 C), Min:98 F (36.7 C), Max:99.8 F (37.7 C)  Recent Labs  Lab 02/10/21 1836 02/10/21 1950 02/10/21 2051 02/10/21 2327 02/11/21 0559 02/11/21 0900 02/11/21 1138  WBC 18.7*  --   --  18.0* 14.6*  --   --   CREATININE 0.90  --   --  0.80  --  0.76  --   LATICACIDVEN  --  2.6* 2.6*  --   --  1.5 1.3     Estimated Creatinine Clearance: 63.8 mL/min (by C-G formula based on SCr of 0.76 mg/dL).    No Known Allergies  Antimicrobials this admission: 10/19 Vancomycin >> x 7 days 10/19 Cefepime >> x 7 days 10/20 Flagyl >> x 7 days  Microbiology results: 10/19 BCx: NG<12 hours  Thank you for allowing pharmacy to be a part of this patient's care.  Bettey Costa, PharmD Clinical Pharmacist 02/11/2021 2:15 PM

## 2021-02-11 NOTE — Progress Notes (Signed)
The patient is confused only oriented to self Talked to the patient's wife on the phone Britta Mccreedy [phone] He lives at home with her He uses a canr at home but also has walkers ,he has a prosthetic leg and manages well His daughter provided transportation, his wife stated that they do not have any needs at home at this time, he is independent and she helps with ADLS

## 2021-02-11 NOTE — Progress Notes (Signed)
Chaplain Maggie attempted to visit but patient unavailable. Pastoral Care expects to follow up.

## 2021-02-12 LAB — COMPREHENSIVE METABOLIC PANEL
ALT: 19 U/L (ref 0–44)
AST: 28 U/L (ref 15–41)
Albumin: 3 g/dL — ABNORMAL LOW (ref 3.5–5.0)
Alkaline Phosphatase: 40 U/L (ref 38–126)
Anion gap: 6 (ref 5–15)
BUN: 9 mg/dL (ref 8–23)
CO2: 27 mmol/L (ref 22–32)
Calcium: 8.4 mg/dL — ABNORMAL LOW (ref 8.9–10.3)
Chloride: 106 mmol/L (ref 98–111)
Creatinine, Ser: 0.58 mg/dL — ABNORMAL LOW (ref 0.61–1.24)
GFR, Estimated: >60 mL/min (ref >60)
Glucose, Bld: 131 mg/dL — ABNORMAL HIGH (ref 70–99)
Potassium: 3.5 mmol/L (ref 3.5–5.1)
Sodium: 139 mmol/L (ref 135–145)
Total Bilirubin: 0.9 mg/dL (ref 0.3–1.2)
Total Protein: 6.4 g/dL — ABNORMAL LOW (ref 6.5–8.1)

## 2021-02-12 LAB — CBC WITH DIFFERENTIAL/PLATELET
Abs Immature Granulocytes: 0.06 10*3/uL (ref 0.00–0.07)
Basophils Absolute: 0 10*3/uL (ref 0.0–0.1)
Basophils Relative: 0 %
Eosinophils Absolute: 0.1 10*3/uL (ref 0.0–0.5)
Eosinophils Relative: 1 %
HCT: 29.1 % — ABNORMAL LOW (ref 39.0–52.0)
Hemoglobin: 9.8 g/dL — ABNORMAL LOW (ref 13.0–17.0)
Immature Granulocytes: 1 %
Lymphocytes Relative: 14 %
Lymphs Abs: 1.3 10*3/uL (ref 0.7–4.0)
MCH: 29.6 pg (ref 26.0–34.0)
MCHC: 33.7 g/dL (ref 30.0–36.0)
MCV: 87.9 fL (ref 80.0–100.0)
Monocytes Absolute: 0.9 10*3/uL (ref 0.1–1.0)
Monocytes Relative: 10 %
Neutro Abs: 6.8 10*3/uL (ref 1.7–7.7)
Neutrophils Relative %: 74 %
Platelets: 190 10*3/uL (ref 150–400)
RBC: 3.31 MIL/uL — ABNORMAL LOW (ref 4.22–5.81)
RDW: 12.2 % (ref 11.5–15.5)
WBC: 9.2 10*3/uL (ref 4.0–10.5)
nRBC: 0 % (ref 0.0–0.2)

## 2021-02-12 LAB — GLUCOSE, CAPILLARY
Glucose-Capillary: 125 mg/dL — ABNORMAL HIGH (ref 70–99)
Glucose-Capillary: 265 mg/dL — ABNORMAL HIGH (ref 70–99)
Glucose-Capillary: 215 mg/dL — ABNORMAL HIGH (ref 70–99)
Glucose-Capillary: 196 mg/dL — ABNORMAL HIGH (ref 70–99)

## 2021-02-12 LAB — PROCALCITONIN: Procalcitonin: 6.48 ng/mL

## 2021-02-12 LAB — PHOSPHORUS: Phosphorus: 2.3 mg/dL — ABNORMAL LOW (ref 2.5–4.6)

## 2021-02-12 LAB — MAGNESIUM: Magnesium: 1.8 mg/dL (ref 1.7–2.4)

## 2021-02-12 MED ORDER — K PHOS MONO-SOD PHOS DI & MONO 155-852-130 MG PO TABS
500.0000 mg | ORAL_TABLET | Freq: Two times a day (BID) | ORAL | Status: AC
  Administered 2021-02-12 (×2): 500 mg via ORAL
  Filled 2021-02-12 (×2): qty 2

## 2021-02-12 MED ORDER — MAGNESIUM SULFATE 2 GM/50ML IV SOLN
2.0000 g | Freq: Once | INTRAVENOUS | Status: AC
  Administered 2021-02-12: 2 g via INTRAVENOUS
  Filled 2021-02-12: qty 50

## 2021-02-12 MED ORDER — POTASSIUM CHLORIDE CRYS ER 20 MEQ PO TBCR
40.0000 meq | EXTENDED_RELEASE_TABLET | Freq: Once | ORAL | Status: AC
  Administered 2021-02-12: 40 meq via ORAL
  Filled 2021-02-12: qty 2

## 2021-02-12 NOTE — Evaluation (Signed)
Physical Therapy Evaluation Patient Details Name: Jose Schroeder MRN: [id number] DOB: 1933/04/15 Today's Date: 02/12/2021  History of Present Illness  87yo male presents to the ED with AMS, was found by family to be crawling to the mailbox. PMHx includes DM, HTN, PVD s/p R BKA (has prosthetic), MCI.    Clinical Impression  Pt received in Semi-Fowler's position and agreeable to therapy.  Pt was able to donn prosthetic leg without any difficulty.  Pt then able to transfer with CGA for safety into standing using FWW.  Pt with good stability in standing and during ambulation.  Pt with good technique throughout and is stable through stair training as well.  Pt able to ambulate up/down stairs with no complications.  Pt then transferred back to room with sitter present and bed alarm on and all needs met.  Pt will benefit from skilled PT intervention to increase independence and safety with basic mobility in preparation for discharge to the venue listed below.        Recommendations for follow up therapy are one component of a multi-disciplinary discharge planning process, led by the attending physician.  Recommendations may be updated based on patient status, additional functional criteria and insurance authorization.  Follow Up Recommendations Home health PT;Supervision for mobility/OOB    Equipment Recommendations  None recommended by PT    Recommendations for Other Services Rehab consult     Precautions / Restrictions Precautions Precautions: Fall Restrictions Weight Bearing Restrictions: No Other Position/Activity Restrictions: R BKA, wears prosthetic      Mobility  Bed Mobility Overal bed mobility: Needs Assistance Bed Mobility: Supine to Sit;Sit to Supine     Supine to sit: Supervision;HOB elevated Sit to supine: Supervision   General bed mobility comments: cues to initiate    Transfers Overall transfer level: Needs assistance Equipment used: Rolling walker (2  wheeled) Transfers: Sit to/from Stand Sit to Stand: Min guard;Supervision         General transfer comment: from std height, supv/SBA, cues for hand placement on RW  Ambulation/Gait                Stairs            Wheelchair Mobility    Modified Rankin (Stroke Patients Only)       Balance Overall balance assessment: Needs assistance Sitting-balance support: No upper extremity supported;Feet supported;Feet unsupported Sitting balance-Leahy Scale: Good     Standing balance support: Bilateral upper extremity supported;No upper extremity supported;During functional activity Standing balance-Leahy Scale: Fair                               Pertinent Vitals/Pain Pain Assessment: No/denies pain    Home Living Family/patient expects to be discharged to:: Private residence Living Arrangements: Spouse/significant other Available Help at Discharge: Family Type of Home: House       Home Layout: One level        Prior Function Level of Independence: Needs assistance   Gait / Transfers Assistance Needed: Per chart/pt, ambulates with SPC & RLE  prosthetic  ADL's / Homemaking Assistance Needed: Pt reports indep, chart indicates that wife and pt "help each other"; dtr provides transportation, groceries. Per pt wife manages medications.  Comments: Pt denies falls     Hand Dominance   Dominant Hand: Right    Extremity/Trunk Assessment   Upper Extremity Assessment Upper Extremity Assessment: Generalized weakness    Lower Extremity Assessment Lower Extremity Assessment:  Generalized weakness       Communication   Communication: No difficulties  Cognition Arousal/Alertness: Awake/alert Behavior During Therapy: WFL for tasks assessed/performed Overall Cognitive Status: History of cognitive impairments - at baseline                                 General Comments: Pt pleasant, oriented to self, follows simple commands well,  some mild sequencing difficulties but with cues does well      General Comments      Exercises Other Exercises Other Exercises: Pt educated on roles of PT and services provided during hospital stay.   Assessment/Plan    PT Assessment Patient needs continued PT services  PT Problem List Decreased strength       PT Treatment Interventions DME instruction;Gait training;Stair training;Functional mobility training;Therapeutic activities;Therapeutic exercise    PT Goals (Current goals can be found in the Care Plan section)  Acute Rehab PT Goals Patient Stated Goal: go home to my wife PT Goal Formulation: With patient Time For Goal Achievement: 02/26/21 Potential to Achieve Goals: Good    Frequency Min 2X/week   Barriers to discharge        Co-evaluation               AM-PAC PT "6 Clicks" Mobility  Outcome Measure Help needed turning from your back to your side while in a flat bed without using bedrails?: A Little Help needed moving from lying on your back to sitting on the side of a flat bed without using bedrails?: A Little Help needed moving to and from a bed to a chair (including a wheelchair)?: A Little Help needed standing up from a chair using your arms (e.g., wheelchair or bedside chair)?: A Little Help needed to walk in hospital room?: A Little Help needed climbing 3-5 steps with a railing? : A Little 6 Click Score: 18    End of Session Equipment Utilized During Treatment: Gait belt Activity Tolerance: Patient tolerated treatment well Patient left: in bed;with call bell/phone within reach;with bed alarm set;with nursing/sitter in room Nurse Communication: Mobility status PT Visit Diagnosis: Unsteadiness on feet (R26.81);Other abnormalities of gait and mobility (R26.89);Repeated falls (R29.6);Muscle weakness (generalized) (M62.81)    Time: 1002-1037 PT Time Calculation (min) (ACUTE ONLY): 35 min   Charges:   PT Evaluation $PT Eval Low Complexity: 1  Low PT Treatments $Gait Training: 8-22 mins $Therapeutic Activity: 8-22 mins         , PT, DPT 02/12/21, 1:36 PM    W  02/12/2021, 1:25 PM  

## 2021-02-12 NOTE — Evaluation (Signed)
Occupational Therapy Evaluation Patient Details Name: Jose Schroeder MRN: [id number] DOB: [birth year] Today's Date: 02/12/2021   History of Present Illness 87yo male presents to the ED with AMS, was found by family to be crawling to the mailbox. PMHx includes DM, HTN, PVD s/p R BKA (has prosthetic), MCI.   Clinical Impression   Pt was seen for OT evaluation this date. Prior to hospital admission, pt was mod indep with mobility using cane, living with his wife in a 1 story home. Per chart/pt he was generally indep with ADL and wife assists with medications and bills. Daughter proves transportation and groceries. Pt pleasant, oriented to self but easily oriented. Follows commands well, PRN VC for sequencing/safety during session. Pt washed hands, face at sink with supervision, CGA for ADL mobility + RW, SBA for toilet transfer (BSC over toilet), pericare in semi standing position; supv for prosthetic mgt and donning sock/shoe on L foot from seated position. Pt would benefit from skilled OT services to address noted impairments and functional limitations (see below for any additional details) in order to maximize safety and independence while minimizing falls risk and caregiver burden. Upon hospital discharge, recommend HHOT to maximize pt safety and return to functional independence during meaningful occupations of daily life.    Recommendations for follow up therapy are one component of a multi-disciplinary discharge planning process, led by the attending physician.  Recommendations may be updated based on patient status, additional functional criteria and insurance authorization.   Follow Up Recommendations  Home health OT;Supervision/Assistance - 24 hour    Equipment Recommendations  None recommended by OT    Recommendations for Other Services       Precautions / Restrictions Precautions Precautions: Fall Restrictions Other Position/Activity Restrictions: R BKA, wears prosthetic       Mobility Bed Mobility Overal bed mobility: Needs Assistance Bed Mobility: Supine to Sit;Sit to Supine     Supine to sit: Supervision;HOB elevated Sit to supine: Supervision   General bed mobility comments: cues to initiate    Transfers Overall transfer level: Needs assistance Equipment used: Rolling walker (2 wheeled) Transfers: Sit to/from Stand Sit to Stand: Min guard;Supervision         General transfer comment: from std height, supv/SBA, cues for hand placement on RW    Balance Overall balance assessment: Needs assistance Sitting-balance support: No upper extremity supported;Feet supported;Feet unsupported Sitting balance-Leahy Scale: Good     Standing balance support: Bilateral upper extremity supported;No upper extremity supported;During functional activity Standing balance-Leahy Scale: Fair                             ADL either performed or assessed with clinical judgement   ADL                                         General ADL Comments: Pt washed hands, face at sink with supervision, CGA for ADL mobility + RW, SBA for toilet transfer (BSC over toilet), pericare in semi standing position; supv for prosthetic mgt and donning sock/shoe on L foot from seated position.     Vision         Perception     Praxis      Pertinent Vitals/Pain Pain Assessment: No/denies pain     Hand Dominance Right   Extremity/Trunk Assessment Upper Extremity Assessment Upper Extremity Assessment: Generalized  weakness   Lower Extremity Assessment Lower Extremity Assessment: Generalized weakness (hx R BKA)       Communication Communication Communication: No difficulties   Cognition Arousal/Alertness: Awake/alert Behavior During Therapy: WFL for tasks assessed/performed Overall Cognitive Status: History of cognitive impairments - at baseline                                 General Comments: Pt pleasant, oriented to  self, follows simple commands well, some mild sequencing difficulties but with cues does well   General Comments       Exercises     Shoulder Instructions      Home Living Family/patient expects to be discharged to:: Private residence Living Arrangements: Spouse/significant other Available Help at Discharge: Family Type of Home: House       Home Layout: One level                          Prior Functioning/Environment Level of Independence: Needs assistance  Gait / Transfers Assistance Needed: Per chart/pt, ambulates with SPC & RLE  prosthetic ADL's / Homemaking Assistance Needed: Pt reports indep, chart indicates that wife and pt "help each other"; dtr provides transportation, groceries. Per pt wife manages medications.   Comments: Pt denies falls        OT Problem List: Decreased cognition;Impaired balance (sitting and/or standing);Decreased safety awareness;Decreased knowledge of use of DME or AE      OT Treatment/Interventions: Self-care/ADL training;Therapeutic exercise;Therapeutic activities;DME and/or AE instruction;Cognitive remediation/compensation;Patient/family education;Balance training    OT Goals(Current goals can be found in the care plan section) Acute Rehab OT Goals Patient Stated Goal: go home to my wife OT Goal Formulation: With patient Time For Goal Achievement: 02/26/21 Potential to Achieve Goals: Good ADL Goals Pt Will Perform Lower Body Dressing: with supervision;sit to/from stand Pt Will Transfer to Toilet: with supervision;ambulating (LRAD PRN) Additional ADL Goal #1: Pt will perform ADL with Supv + PRN VC for sfety/sequencing, 5/5 opportunities.  OT Frequency: Min 2X/week   Barriers to D/C:            Co-evaluation              AM-PAC OT "6 Clicks" Daily Activity     Outcome Measure Help from another person eating meals?: None Help from another person taking care of personal grooming?: None Help from another person  toileting, which includes using toliet, bedpan, or urinal?: A Little Help from another person bathing (including washing, rinsing, drying)?: A Little Help from another person to put on and taking off regular upper body clothing?: None Help from another person to put on and taking off regular lower body clothing?: A Little 6 Click Score: 21   End of Session Equipment Utilized During Treatment: Gait belt;Rolling walker Nurse Communication: Mobility status  Activity Tolerance: Patient tolerated treatment well Patient left: in bed;with call bell/phone within reach;with bed alarm set;with nursing/sitter in room  OT Visit Diagnosis: Other abnormalities of gait and mobility (R26.89);Other symptoms and signs involving cognitive function                Time: 0936-1006 OT Time Calculation (min): 30 min Charges:  OT General Charges $OT Visit: 1 Visit OT Evaluation $OT Eval Low Complexity: 1 Low OT Treatments $Self Care/Home Management : 8-22 mins  Arman Filter., MPH, MS, OTR/L ascom [phone] 02/12/21, 10:35 AM

## 2021-02-12 NOTE — Progress Notes (Signed)
Pharmacy Antibiotic Note  Jose Schroeder is a 87 y.o. male admitted on 02/10/2021 with suspected sepsis from unknown source.  Pharmacy has been consulted for Cefepime dosing.  Patient's renal function is back at baseline(SCR 0.9>0.76>0.58).   Plan: Will continue Cefepime 2 gm q8h. Likely changing to PO abx on 10/21  Pharmacy will continue to follow and will adjust dosing if warranted.  Height: 6' (182.9 cm) Weight: 68 kg (150 lb) IBW/kg (Calculated) : 77.6  Temp (24hrs), Avg:98.2 F (36.8 C), Min:97.8 F (36.6 C), Max:98.7 F (37.1 C)  Recent Labs  Lab 02/10/21 1836 02/10/21 1950 02/10/21 2051 02/10/21 2327 02/11/21 0559 02/11/21 0900 02/11/21 1138 02/11/21 2130 02/12/21 0335  WBC 18.7*  --   --  18.0* 14.6*  --   --   --  9.2  CREATININE 0.90  --   --  0.80  --  0.76  --  0.73 0.58*  LATICACIDVEN  --  2.6* 2.6*  --   --  1.5 1.3  --   --      Estimated Creatinine Clearance: 63.8 mL/min (A) (by C-G formula based on SCr of 0.58 mg/dL (L)).    No Known Allergies  Antimicrobials this admission: 10/19 Vancomycin >> 10/20 10/19 Cefepime >>  10/20 Flagyl >> 10/21  Microbiology results: 10/19 BCx: NGTD 10/20 MRSA PCR: negative  Thank you for allowing pharmacy to be a part of this patient's care.  Bettey Costa, PharmD Clinical Pharmacist 02/12/2021 2:27 PM

## 2021-02-12 NOTE — Care Management Important Message (Signed)
Important Message  Patient Details  Name: Jose Schroeder MRN: [id number] Date of Birth: April 09, 1933   Medicare Important Message Given:  Yes     Olegario Messier A Lousie Calico 02/12/2021, 12:25 PM

## 2021-02-12 NOTE — Progress Notes (Signed)
PROGRESS NOTE    Jose Schroeder  ESP:[id number] DOB: 1932-06-11 DOA: 02/10/2021 PCP: Juline Patch, MD   Brief Narrative:  The patient is an 87 year old elderly African-American male with past medical history significant for but not limited to diabetes mellitus type 2, hypertension, hyperlipidemia, peripheral vascular disease status post right BKA as well as other comorbidities who lives at home and is currently doing fine but he was found confused by his family saying that he is try to climb to his mailbox and get his mail.  This is very unusual for the patient and he is known to have a mild memory impairment and not sure of a full diagnosis of dementia but he is brought to the ED he still confused and unable to give adequate history.  He met sepsis criteria without any identifiable source.  He had no fevers or chills but did have a significant leukocytosis and also tachycardia as well as lactic acidosis.  His COVID-19 testing was negative and he was admitted for sepsis with unclear source.  Chest x-ray showed no acute findings.  Vancomycin was discontinued yesterday given that his MRSA PCR was negative.  We will discontinue Flagyl today and continue on cefepime for now.  Sources on culture still have not grown anything yet and patient does not have any specific complaints.  PT OT evaluated and recommending home health PT and OT when medically stable to be discharged.  Assessment & Plan:   Principal Problem:   Sepsis ([place]) Active Problems:   Diabetes mellitus ([place])   Essential hypertension   PVD (peripheral vascular disease) ([place])   Amputated below knee, right (HCC)   Anemia   Delirium   Hyponatremia  Sepsis Secondary to unclear etiology and source -Presented with a WBC of 18.7 he was tachycardic and tachypneic without a clear source of infection but he did have a lactic acidosis and is trended down and resolved -Procalcitonin level was 9.10 and trended down to 8.84 yesterday and  today it was 6.48 -Urinalysis was relatively unremarkable and was negative for ketones, leukocytes but did show rare bacteria and 0-5 squamous epithelial cells and 0-5 RBCs per high-powered field as well as 0-5 WBCs -Blood cultures x2 showed no growth to date 2 days -WBC went from 18.7 -> 18.0 -> 14.6 and today has normalized at 9.2 -Chest x-ray showed no active disease -We will follow-up on cultures and patient was empirically placed on antibiotics with IV vancomycin, IV cefepime, IV Flagyl  -top IV vancomycin given the patient is MRSA negative -Patient received a 1 L sodium chloride bolus and then was placed on maintenance IV fluids with lactated Ringer's at 100 MLS per hour which we will continue throughout today and reduced to 75 MLS per hour  Hyponatremia -Mild and improved with IV fluid hydration -Na+ went from 132 -> 137 -> 136 and is now 139 -Continue to Monitor and Trend -Repeat CMP in the AM  Acute delirium and encephalopathy superimposed on baseline dementia -Mental status is improving and he was awake and alert and talkative  -Head CT done and showed "Atrophy, chronic microvascular disease. No acute intracranial abnormality." -Continue with IV fluid hydration as above -Continue supportive care and will need PT OT to further evaluate and treat and recommending [place] on delirium precautions but overnight had to have a Air cabin crew instead of [employer] given his Agitation and confusion   Hypomagnesemia -Patient's mag level was 1.3 and improved to 1.8 -Replete with IV mag sulfate 2  g -Continue to monitor and replete as necessary -Repeat magnesium level in a.m.  Hypokalemia -Patient's potassium was 3.5 -Replete with p.o. KCl 40 mEQ x1 and also with K Phos Neutral 500 mg po BID x2  -Continue to monitor and replete as necessary -Repeat CMP in a.m.  Hypophosphatemia -Patient's Phos Level was 2.3 -Replete with po K Phos Neutral 500 mg x2 -Continue to Monitor  and replete as Necessary -Repeat Phos in the AM   Non-insulin-dependent diabetes mellitus type 2 -Patient was initiated on moderate NovoLog/scale insulin before meals and at bedtime -Continue to hold his home medications including glipizide 10 mg p.o. daily with breakfast, metformin 500 mg p.o. twice daily, sitagliptin 100 mg p.o. daily -CBGs ranged from 125-265  Normocytic Anemia -Patient's hemoglobin/hematocrit went from 9.8/28.6 -> 9.5/27.5 -> 9.6/28.0 -> 9.8/29.1 -Continue with ferrous sulfate 325 mg p.o. daily with breakfast -Check Anemia Panel in the AM -Continue to Monitor for S/Sx of Bleeding -Repeat CBC in the AM   HLD -C/w Atorvastatin 20 mg p.o. daily  HTN -C/w amlodipine 10 mg p.o. daily, lisinopril 10 mg p.o. daily, metoprolol tartrate 50 g p.o. twice daily and will hold his hydrochlorothiazide 25 mg p.o. daily given that he is getting IV fluid hydration -Continue to monitor blood pressures per protocol -Last BP was 134/71  PVD -Patient is s/p Right BKA  -Resume aspirin 81 mg p.o. daily as well as atorvastatin 20 g p.o. daily -Had some mild tenderness around the Stump but no S/Sx of Infection  DVT prophylaxis: Enoxaparin 40 mg sq q24h Code Status: FULL CODE  Family Communication: No family present at bedside  Disposition Plan: Ending further clinical improvement and he will need evaluation by PT OT  Status is: Inpatient  Remains inpatient appropriate because: Unclear source of infection and confusion need to be improved.  We will need PT and OT to evaluate for safe discharge disposition  Consultants:  None  Procedures: None  Antimicrobials:  Anti-infectives (From admission, onward)    Start     Dose/Rate Route Frequency Ordered Stop   02/11/21 2200  vancomycin (VANCOREADY) IVPB 1250 mg/250 mL  Status:  Discontinued        1,250 mg 166.7 mL/hr over 90 Minutes Intravenous Every 24 hours 02/10/21 2310 02/11/21 1413   02/11/21 2200  ceFEPIme (MAXIPIME) 2 g in  sodium chloride 0.9 % 100 mL IVPB        2 g 200 mL/hr over 30 Minutes Intravenous Every 8 hours 02/11/21 1413     02/11/21 2000  vancomycin (VANCOREADY) IVPB 1500 mg/300 mL  Status:  Discontinued        1,500 mg 150 mL/hr over 120 Minutes Intravenous Every 24 hours 02/11/21 1413 02/11/21 1432   02/11/21 1000  ceFEPIme (MAXIPIME) 2 g in sodium chloride 0.9 % 100 mL IVPB  Status:  Discontinued        2 g 200 mL/hr over 30 Minutes Intravenous Every 12 hours 02/10/21 2228 02/11/21 1413   02/10/21 2315  ceFEPIme (MAXIPIME) 2 g in sodium chloride 0.9 % 100 mL IVPB  Status:  Discontinued        2 g 200 mL/hr over 30 Minutes Intravenous  Once 02/10/21 2300 02/10/21 2309   02/10/21 2315  metroNIDAZOLE (FLAGYL) IVPB 500 mg  Status:  Discontinued        500 mg 100 mL/hr over 60 Minutes Intravenous Every 12 hours 02/10/21 2300 02/12/21 1041   02/10/21 2315  vancomycin (VANCOCIN) IVPB 1000 mg/200 mL premix  Status:  Discontinued        1,000 mg 200 mL/hr over 60 Minutes Intravenous  Once 02/10/21 2300 02/10/21 2309   02/10/21 2230  vancomycin (VANCOREADY) IVPB 1500 mg/300 mL        1,500 mg 150 mL/hr over 120 Minutes Intravenous  Once 02/10/21 2215 02/11/21 0104   02/10/21 2115  vancomycin (VANCOCIN) IVPB 1000 mg/200 mL premix  Status:  Discontinued        1,000 mg 200 mL/hr over 60 Minutes Intravenous  Once 02/10/21 2114 02/10/21 2321   02/10/21 2115  ceFEPIme (MAXIPIME) 2 g in sodium chloride 0.9 % 100 mL IVPB        2 g 200 mL/hr over 30 Minutes Intravenous  Once 02/10/21 2114 02/10/21 2155        Subjective: Seen and examined at bedside and he was doing okay.  Denies chest pain or shortness of breath.  No nausea or vomiting and no burning or discomfort in his urine.  He denies that he had abdominal but the nurse tech actually states that he did have one earlier.  No other concerns or complaints at this time.  Objective: Vitals:   02/11/21 2330 02/12/21 0343 02/12/21 0722 02/12/21 1132   BP: (!) 151/84 (!) 147/69 (!) 148/81 134/71  Pulse: 88 70 77 74  Resp: _0 Temp: 98 F (36.7 C) 98.3 F (36.8 C) 98.1 F (36.7 C) 98.2 F (36.8 C)  TempSrc: Oral Oral Oral Oral  SpO2: 100% 100% 100% 100%  Weight:      Height:        Intake/Output Summary (Last 24 hours) at 02/12/2021 1411 Last data filed at 02/12/2021 0900 Gross per 24 hour  Intake 869.94 ml  Output 1250 ml  Net -380.06 ml    Filed Weights   02/10/21 1742  Weight: 68 kg   Examination: Physical Exam:  Constitutional: Thin chronically ill-appearing African-American male currently in no acute distress sitting up in the bed Eyes: Lids and conjunctivae normal, sclerae anicteric  ENMT: External Ears, Nose appear normal. Grossly normal hearing. Neck: Appears normal, supple, no cervical masses, normal ROM, no appreciable thyromegaly: No appreciable JVD Respiratory: Mildly diminished to auscultation bilaterally, no wheezing, rales, rhonchi or crackles. Normal respiratory effort and patient is not tachypenic. No accessory muscle use.,  Unlabored breathing Cardiovascular: RRR, no murmurs / rubs / gallops. S1 and S2 auscultated. No extremity edema.  Abdomen: Soft, non-tender, non-distended. Bowel sounds positive.  GU: Deferred. Musculoskeletal: No clubbing / cyanosis of digits/nails.  Has a right BKA Skin: No rashes, lesions, ulcers. No induration; Warm and dry.  Neurologic: CN 2-12 grossly intact with no focal deficits. Romberg sign cerebellar reflexes not assessed.  Psychiatric: Mildly impaired judgment and insight. Alert and oriented x 2. Normal mood and appropriate affect.   Data Reviewed: I have personally reviewed following labs and imaging studies  CBC: Recent Labs  Lab 02/10/21 1836 02/10/21 2327 02/11/21 0559 02/12/21 0335  WBC 18.7* 18.0* 14.6* 9.2  NEUTROABS 16.7*  --   --  6.8  HGB 9.8* 9.5* 9.6* 9.8*  HCT 28.6* 27.5* 28.0* 29.1*  MCV 90.2 89.6 89.2 87.9  PLT 185 183 188 190     Basic Metabolic Panel: Recent Labs  Lab 02/10/21 1836 02/10/21 2327 02/11/21 0900 02/11/21 2130 02/12/21 0335  NA 132*  --  137 136 139  K 3.8  --  3.4* 3.8 3.5  CL 100  --  102 104 106  CO2  28  --  _0 GLUCOSE 382*  --  118* 245* 131*  BUN 16  --  _1 CREATININE 0.90 0.80 0.76 0.73 0.58*  CALCIUM 8.8*  --  8.6* 8.3* 8.4*  MG  --   --  1.3*  --  1.8  PHOS  --   --  2.4*  --  2.3*    GFR: Estimated Creatinine Clearance: 63.8 mL/min (A) (by C-G formula based on SCr of 0.58 mg/dL (L)). Liver Function Tests: Recent Labs  Lab 02/10/21 1836 02/11/21 0900 02/11/21 2130 02/12/21 0335  AST _2 ALT _3 ALKPHOS 45 38 38 40  BILITOT 0.8 1.0 0.9 0.9  PROT 7.2 6.2* 6.3* 6.4*  ALBUMIN 4.1 3.4* 3.4* 3.0*    No results for input(s): LIPASE, AMYLASE in the last 168 hours. No results for input(s): AMMONIA in the last 168 hours. Coagulation Profile: Recent Labs  Lab 02/11/21 0559  INR 1.4*    Cardiac Enzymes: No results for input(s): CKTOTAL, CKMB, CKMBINDEX, TROPONINI in the last 168 hours. BNP (last 3 results) No results for input(s): PROBNP in the last 8760 hours. HbA1C: No results for input(s): HGBA1C in the last 72 hours. CBG: Recent Labs  Lab 02/11/21 1117 02/11/21 1554 02/11/21 2224 02/12/21 0722 02/12/21 1134  GLUCAP 120* 105* 239* 125* 265*    Lipid Profile: No results for input(s): CHOL, HDL, LDLCALC, TRIG, CHOLHDL, LDLDIRECT in the last 72 hours. Thyroid Function Tests: No results for input(s): TSH, T4TOTAL, FREET4, T3FREE, THYROIDAB in the last 72 hours. Anemia Panel: No results for input(s): VITAMINB12, FOLATE, FERRITIN, TIBC, IRON, RETICCTPCT in the last 72 hours. Sepsis Labs: Recent Labs  Lab 02/10/21 1950 02/10/21 2051 02/11/21 0559 02/11/21 0900 02/11/21 1138 02/12/21 0335  PROCALCITON  --   --  9.10 8.84  --  6.48  LATICACIDVEN 2.6* 2.6*  --  1.5 1.3  --      Recent Results (from the past 240 hour(s))   Resp Panel by RT-PCR (Flu A&B, Covid) Nasopharyngeal Swab     Status: None   Collection Time: 02/10/21  7:50 PM   Specimen: Nasopharyngeal Swab; Nasopharyngeal(NP) swabs in vial transport medium  Result Value Ref Range Status   SARS Coronavirus 2 by RT PCR NEGATIVE NEGATIVE Final    Comment: (NOTE) SARS-CoV-2 target nucleic acids are NOT DETECTED.  The SARS-CoV-2 RNA is generally detectable in upper respiratory specimens during the acute phase of infection. The lowest concentration of SARS-CoV-2 viral copies this assay can detect is 138 copies/mL. A negative result does not preclude SARS-Cov-2 infection and should not be used as the sole basis for treatment or other patient management decisions. A negative result may occur with  improper specimen collection/handling, submission of specimen other than nasopharyngeal swab, presence of viral mutation(s) within the areas targeted by this assay, and inadequate number of viral copies(<138 copies/mL). A negative result must be combined with clinical observations, patient history, and epidemiological information. The expected result is Negative.  Fact Sheet for Patients:  EntrepreneurPulse.com.au  Fact Sheet for Healthcare Providers:  IncredibleEmployment.be  This test is no t yet approved or cleared by the [place] FDA and  has been authorized for detection and/or diagnosis of SARS-CoV-2 by FDA under an Emergency Use Authorization (EUA). This EUA will remain  in effect (meaning this test can be used) for the duration of the COVID-19 declaration under Section 564(b)(1) of the Act, 21 U.S.C.section 360bbb-3(b)(1), unless  the authorization is terminated  or revoked sooner.       Influenza A by PCR NEGATIVE NEGATIVE Final   Influenza B by PCR NEGATIVE NEGATIVE Final    Comment: (NOTE) The Xpert Xpress SARS-CoV-2/FLU/RSV plus assay is intended as an aid in the diagnosis of influenza from  Nasopharyngeal swab specimens and should not be used as a sole basis for treatment. Nasal washings and aspirates are unacceptable for Xpert Xpress SARS-CoV-2/FLU/RSV testing.  Fact Sheet for Patients: EntrepreneurPulse.com.au  Fact Sheet for Healthcare Providers: IncredibleEmployment.be  This test is not yet approved or cleared by the [place] FDA and has been authorized for detection and/or diagnosis of SARS-CoV-2 by FDA under an Emergency Use Authorization (EUA). This EUA will remain in effect (meaning this test can be used) for the duration of the COVID-19 declaration under Section 564(b)(1) of the Act, 21 U.S.C. section 360bbb-3(b)(1), unless the authorization is terminated or revoked.  Performed at [facility], [address] [place], [place] [address]   Blood culture (routine x 2)     Status: None (Preliminary result)   Collection Time: 02/10/21  8:50 PM   Specimen: BLOOD  Result Value Ref Range Status   Specimen Description BLOOD LEFT ANTECUBITAL  Final   Special Requests   Final    BOTTLES DRAWN AEROBIC AND ANAEROBIC Blood Culture results may not be optimal due to an inadequate volume of blood received in culture bottles   Culture   Final    NO GROWTH 2 DAYS Performed at [facility], [address], [place], [place] [address]    Report Status PENDING  Incomplete  Blood culture (routine x 2)     Status: None (Preliminary result)   Collection Time: 02/10/21  8:50 PM   Specimen: BLOOD  Result Value Ref Range Status   Specimen Description BLOOD BLOOD LEFT HAND  Final   Special Requests   Final    BOTTLES DRAWN AEROBIC ONLY Blood Culture results may not be optimal due to an inadequate volume of blood received in culture bottles   Culture   Final    NO GROWTH 2 DAYS Performed at [facility], [address], [place], [place] [address]    Report Status PENDING  Incomplete  Surgical pcr screen      Status: None   Collection Time: 02/11/21 11:20 AM   Specimen: Nasal Mucosa; Nasal Swab  Result Value Ref Range Status   MRSA, PCR NEGATIVE NEGATIVE Final   Staphylococcus aureus NEGATIVE NEGATIVE Final    Comment: (NOTE) The Xpert SA Assay (FDA approved for NASAL specimens in patients 24 years of age and older), is one component of a comprehensive surveillance program. It is not intended to diagnose infection nor to guide or monitor treatment. Performed at [facility], [address] [place], [place] [address]      RN Pressure Injury Documentation:     Estimated body mass index is 20.34 kg/m as calculated from the following:   Height as of this encounter: 6' (1.829 m).   Weight as of this encounter: 68 kg.  Malnutrition Type:   Malnutrition Characteristics:   Nutrition Interventions:    Radiology Studies: CT Head Wo Contrast  Result Date: 02/10/2021 CLINICAL DATA:  Altered mental status EXAM: CT HEAD WITHOUT CONTRAST TECHNIQUE: Contiguous axial images were obtained from the base of the skull through the vertex without intravenous contrast. COMPARISON:  None. FINDINGS: Brain: There is atrophy and chronic small vessel disease changes. No acute  intracranial abnormality. Specifically, no hemorrhage, hydrocephalus, mass lesion, acute infarction, or significant intracranial injury. Vascular: No hyperdense vessel or unexpected calcification. Skull: No acute calvarial abnormality. Sinuses/Orbits: No acute findings Other: None IMPRESSION: Atrophy, chronic microvascular disease. No acute intracranial abnormality. Electronically Signed   By: Rolm Baptise M.D.   On: 02/10/2021 18:49   DG Chest Portable 1 View  Result Date: 02/10/2021 CLINICAL DATA:  Altered mental status EXAM: PORTABLE CHEST 1 VIEW COMPARISON:  None. FINDINGS: Heart and mediastinal contours are within normal limits. No focal opacities or effusions. No acute bony abnormality. IMPRESSION: No active disease.  Electronically Signed   By: Rolm Baptise M.D.   On: 02/10/2021 19:35    Scheduled Meds:  amLODipine  10 mg Oral Daily   aspirin EC  81 mg Oral Daily   atorvastatin  20 mg Oral Daily   enoxaparin (LOVENOX) injection  40 mg Subcutaneous Q24H   ferrous sulfate  325 mg Oral Q breakfast   insulin aspart  0-15 Units Subcutaneous TID WC   insulin aspart  0-5 Units Subcutaneous QHS   lisinopril  10 mg Oral Daily   metoprolol tartrate  50 mg Oral BID   phosphorus  500 mg Oral BID   Continuous Infusions:  ceFEPime (MAXIPIME) IV 2 g (02/12/21 0516)   lactated ringers 75 mL/hr at 02/12/21 0514    LOS: 2 days   Kerney Elbe, DO [facility] PAGER is on AMION  If 7PM-7AM, please contact night-coverage [email or web address]

## 2021-02-13 ENCOUNTER — Inpatient Hospital Stay: Payer: Medicare Other

## 2021-02-13 LAB — MAGNESIUM: Magnesium: 1.8 mg/dL (ref 1.7–2.4)

## 2021-02-13 LAB — CBC WITH DIFFERENTIAL/PLATELET
Abs Immature Granulocytes: 0.02 10*3/uL (ref 0.00–0.07)
Basophils Absolute: 0 10*3/uL (ref 0.0–0.1)
Basophils Relative: 0 %
Eosinophils Absolute: 0.1 10*3/uL (ref 0.0–0.5)
Eosinophils Relative: 2 %
HCT: 28.1 % — ABNORMAL LOW (ref 39.0–52.0)
Hemoglobin: 9.4 g/dL — ABNORMAL LOW (ref 13.0–17.0)
Immature Granulocytes: 0 %
Lymphocytes Relative: 23 %
Lymphs Abs: 1.6 10*3/uL (ref 0.7–4.0)
MCH: 29.7 pg (ref 26.0–34.0)
MCHC: 33.5 g/dL (ref 30.0–36.0)
MCV: 88.6 fL (ref 80.0–100.0)
Monocytes Absolute: 0.7 10*3/uL (ref 0.1–1.0)
Monocytes Relative: 11 %
Neutro Abs: 4.4 10*3/uL (ref 1.7–7.7)
Neutrophils Relative %: 64 %
Platelets: 191 10*3/uL (ref 150–400)
RBC: 3.17 MIL/uL — ABNORMAL LOW (ref 4.22–5.81)
RDW: 12.1 % (ref 11.5–15.5)
WBC: 6.9 10*3/uL (ref 4.0–10.5)
nRBC: 0 % (ref 0.0–0.2)

## 2021-02-13 LAB — COMPREHENSIVE METABOLIC PANEL
ALT: 24 U/L (ref 0–44)
AST: 36 U/L (ref 15–41)
Albumin: 3.4 g/dL — ABNORMAL LOW (ref 3.5–5.0)
Alkaline Phosphatase: 41 U/L (ref 38–126)
Anion gap: 5 (ref 5–15)
BUN: 11 mg/dL (ref 8–23)
CO2: 27 mmol/L (ref 22–32)
Calcium: 8.4 mg/dL — ABNORMAL LOW (ref 8.9–10.3)
Chloride: 105 mmol/L (ref 98–111)
Creatinine, Ser: 0.62 mg/dL (ref 0.61–1.24)
GFR, Estimated: >60 mL/min (ref >60)
Glucose, Bld: 215 mg/dL — ABNORMAL HIGH (ref 70–99)
Potassium: 4 mmol/L (ref 3.5–5.1)
Sodium: 137 mmol/L (ref 135–145)
Total Bilirubin: 1.2 mg/dL (ref 0.3–1.2)
Total Protein: 6.3 g/dL — ABNORMAL LOW (ref 6.5–8.1)

## 2021-02-13 LAB — IRON AND TIBC
Iron: 71 ug/dL (ref 45–182)
Saturation Ratios: 40 % — ABNORMAL HIGH (ref 17.9–39.5)
TIBC: 179 ug/dL — ABNORMAL LOW (ref 250–450)
UIBC: 108 ug/dL

## 2021-02-13 LAB — PHOSPHORUS: Phosphorus: 2.8 mg/dL (ref 2.5–4.6)

## 2021-02-13 LAB — RETICULOCYTES
Immature Retic Fract: 22.3 % — ABNORMAL HIGH (ref 2.3–15.9)
RBC.: 3.15 MIL/uL — ABNORMAL LOW (ref 4.22–5.81)
Retic Count, Absolute: 97.7 10*3/uL (ref 19.0–186.0)
Retic Ct Pct: 3.1 % (ref 0.4–3.1)

## 2021-02-13 LAB — GLUCOSE, CAPILLARY
Glucose-Capillary: 297 mg/dL — ABNORMAL HIGH (ref 70–99)
Glucose-Capillary: 292 mg/dL — ABNORMAL HIGH (ref 70–99)

## 2021-02-13 LAB — PROCALCITONIN: Procalcitonin: 3.95 ng/mL

## 2021-02-13 LAB — FERRITIN: Ferritin: 218 ng/mL (ref 24–336)

## 2021-02-13 LAB — FOLATE: Folate: 21.5 ng/mL (ref >5.9)

## 2021-02-13 LAB — VITAMIN B12: Vitamin B-12: 238 pg/mL (ref 180–914)

## 2021-02-13 MED ORDER — AMOXICILLIN-POT CLAVULANATE 875-125 MG PO TABS: 1.0000 | ORAL_TABLET | Freq: Two times a day (BID) | ORAL | 0 refills | Status: DC

## 2021-02-13 MED ORDER — MAGNESIUM SULFATE 2 GM/50ML IV SOLN
2.0000 g | Freq: Once | INTRAVENOUS | Status: AC
  Administered 2021-02-13: 2 g via INTRAVENOUS
  Filled 2021-02-13: qty 50

## 2021-02-13 MED ORDER — ACETAMINOPHEN 325 MG PO TABS: 650.0000 mg | ORAL_TABLET | Freq: Four times a day (QID) | ORAL | 0 refills | Status: AC | PRN

## 2021-02-13 MED ORDER — ONDANSETRON HCL 4 MG PO TABS: 4.0000 mg | ORAL_TABLET | Freq: Four times a day (QID) | ORAL | 0 refills | Status: DC | PRN

## 2021-02-13 NOTE — TOC Transition Note (Signed)
Transition of Care [facility]) - CM/SW Discharge Note   Patient Details  Name: Jose Schroeder MRN: [id number] Date of Birth: 1933-02-03  [facility] of [facility] CM/SW Contact:  Bing Quarry, RN Phone Number: 02/13/2021, 12:46 PM   Clinical Narrative:   Pt/OT evaluations completed and HH services recommended. Spoke with patient spouse, Britta Mccreedy, and she had used [facility] in the past. [facility] accepted patient via Willaim Rayas at [phone] with PT SoS on Monday 02/15/21. Patient has all needed DME. Spouse will arrange someone to transport patient home from hospital. Spoke with spouse due to documentation of patient confusion. Gabriel Cirri RN CM   Final next level of care: Home w Home Health Services Barriers to Discharge: Barriers Resolved   Patient Goals and CMS Choice     Choice offered to / list presented to : Spouse  Discharge Placement:                   Name of family member notified: Spouse Britta Mccreedy notified of [facility] agency and discharge today. [phone] Patient and family notified of of transfer: 02/13/21 (Notified in impending discharge today.)  Discharge Plan and Services   Discharge Planning Services: (P) CM Consult            DME Arranged: N/A (Has all needed DME at home per spouse.)         HH Arranged: OT, PT HH Agency: [facility] Date [facility] Agency Contacted: 02/13/21 Time HH Agency Contacted: 1245 Representative spoke with at [facility] Agency: Willaim Rayas on call for [facility]--accepted SOS 02/15/21.  Social Determinants of Health (SDOH) Interventions     Readmission Risk Interventions No flowsheet data found.

## 2021-02-13 NOTE — Progress Notes (Signed)
Blood pressure (!) 151/73, pulse 69, temperature 97.7 F (36.5 C), resp. rate 17, height 6' (1.829 m), weight 68 kg, SpO2 100 %. IV cath removed site is c/d/I, discussed d/c packet with pt and daughter both verbalized understanding of information. Pt transported via w/c down to private car with all belongings.

## 2021-02-13 NOTE — Discharge Summary (Signed)
Physician Discharge Summary  Jose CHARLOT XHB:[id number] DOB: July 26, 1932 DOA: 02/10/2021  PCP: Juline Patch, MD  Admit date: 02/10/2021 Discharge date: 02/13/2021  Admitted From: Home Disposition: Home with Home Health PT/OT  Recommendations for Outpatient Follow-up:  Follow up with PCP in 1-2 weeks Follow up with Neurology in the outpatient setting for outpatient workup for Dementia Please obtain CMP/CBC, Mag, Phos in one week Please follow up on the following pending results:  Home Health: Yes  Equipment/Devices: None    Discharge Condition: Stable  CODE STATUS: FULL CODE   Diet recommendation: Heart Healthy Carb Modified Diet  Brief/Interim Summary: The patient is an 87 year old elderly African-American male with past medical history significant for but not limited to diabetes mellitus type 2, hypertension, hyperlipidemia, peripheral vascular disease status post right BKA as well as other comorbidities who lives at home and is currently doing fine but he was found confused by his family saying that he is try to climb to his mailbox and get his mail.  This is very unusual for the patient and he is known to have a mild memory impairment and not sure of a full diagnosis of dementia but he is brought to the ED he still confused and unable to give adequate history.  He met sepsis criteria without any identifiable source.  He had no fevers or chills but did have a significant leukocytosis and also tachycardia as well as lactic acidosis.  His COVID-19 testing was negative and he was admitted for sepsis with unclear source.  Chest x-ray showed no acute findings.  Vancomycin was discontinued yesterday given that his MRSA PCR was negative.  We will discontinue Flagyl today and continue on cefepime for now.  Sources on culture still have not grown anything yet and patient does not have any specific complaints but is significantly improved.  PT OT evaluated and recommending home health PT and  OT.  I spoke with infectious diseases Dr. Juleen China who recommends discharging the patient on 7 more days of Augmentin given that the cultures have not grown anything.  Patient is significantly improved and back to his baseline and wife was updated about the plan of care and she is appreciated of the care.  He will need to follow-up with PCP as well as neurology in the outpatient setting for further work-up of his dementia  Discharge Diagnoses:  Principal Problem:   Sepsis ([place]) Active Problems:   Diabetes mellitus ([place])   Essential hypertension   PVD (peripheral vascular disease) ([place])   Amputated below knee, right ([place])   Anemia   Delirium   Hyponatremia  Sepsis Secondary to unclear etiology and source, improved significantly -Presented with a WBC of 18.7 he was tachycardic and tachypneic without a clear source of infection but he did have a lactic acidosis and is trended down and resolved -Procalcitonin level was 9.10 and trended all the way down to 3.95 today -Urinalysis was relatively unremarkable and was negative for ketones, leukocytes but did show rare bacteria and 0-5 squamous epithelial cells and 0-5 RBCs per high-powered field as well as 0-5 WBCs; no urine culture was done given his urinalysis findings -MRSA PCR was negative -X-ray of his knee as below and unremarkable -Blood cultures x2 showed no growth to date 2 days stable -WBC went from 18.7 -> 18.0 -> 14.6 and today has normalized at 9.2 and further trended down to 6.9 -Chest x-ray showed no active disease -Initially was placed on empiric antibiotics with IV vancomycin, IV cefepime and  IV Flagyl in the IV vancomycin and IV Flagyl have been discontinued and IV cefepime will be changed to p.o. Augmentin for 7 more days -IV fluid hydration now stopped -Given lack of source of infection I spoke with ID who recommends 7 more days of Augmentin p.o. at discharge -Follow-up with PCP within 1 to 2 weeks and continue to monitor for  signs and symptoms of infection   Hyponatremia -Mild and improved with IV fluid hydration -Na+ went from 132 -> 137 -> 136  -> 139 -> 137 -Continue to Monitor and Trend -Repeat CMP within 1 week   Acute delirium and encephalopathy superimposed on baseline dementia -Mental status is improving and he was awake and alert and talkative; appears to be his baseline and is only alert and oriented x1 -Head CT done and showed "Atrophy, chronic microvascular disease. No acute intracranial abnormality." -Continue with IV fluid hydration as above -Continue supportive care and will need PT OT to further evaluate and treat and recommending [place] on delirium precautions but overnight had to have a Air cabin crew instead of [employer] given his Agitation and confusion  -He is back to his baseline and wife states that he is chronically confused and the recommendation is to have the patient follow-up with outpatient setting with neurology for further work-up of his dementia   Hypomagnesemia -Patient's mag level was 1.3 and improved to 1.8 -Replete with IV mag sulfate 2 g prior to D/C -Continue to monitor and replete as necessary -Repeat magnesium level within 1 week    Hypokalemia -Patient's potassium was 3.5 and improved to 4.0 -Continue to monitor and replete as necessary -Repeat CMP within 1 week    Hypophosphatemia -Patient's Phos Level was 2.3 and improved to 2.8 -Continue to Monitor and replete as Necessary -Repeat Phos Level within 1 week    Non-insulin-dependent diabetes mellitus type 2 -Patient was initiated on moderate NovoLog/scale insulin before meals and at bedtime -Continue to hold his home medications including glipizide 10 mg p.o. daily with breakfast, metformin 500 mg p.o. twice daily, sitagliptin 100 mg p.o. daily; resume these at discharge -CBGs ranged from 125-297   Normocytic Anemia -Patient's hemoglobin/hematocrit went from 9.8/28.6 -> 9.5/27.5 -> 9.6/28.0 ->  9.8/29.1 and today it is now 9.4/20.1 on the day of discharge -Continue with ferrous sulfate 325 mg p.o. daily with breakfast -Checked Anemia Panel and showed an iron level of 71, U IBC of 108, TIBC 179, saturation ratios of 40%, ferritin level 218, folate level 21.5, vitamin B12 level 235 -Continue to Monitor for S/Sx of Bleeding -Repeat CBC within 1 week   HLD -C/w Atorvastatin 20 mg p.o. daily   HTN -C/w amlodipine 10 mg p.o. daily, lisinopril 10 mg p.o. daily, metoprolol tartrate 50 g p.o. twice daily and will hold his hydrochlorothiazide 25 mg p.o. daily given that he is getting IV fluid hydration -Continue to monitor blood pressures per protocol -Last BP was 151/73   PVD -Patient is s/p Right BKA  -Resume aspirin 81 mg p.o. daily as well as atorvastatin 20 g p.o. daily -X-ray done and showed "Signs of below the knee amputation identified. There are no focal bone erosions identified to suggest osteomyelitis. No acute fracture or dislocation. Atherosclerotic calcifications are identified within the soft tissues." -Had some mild tenderness around the Stump but no S/Sx of Infection    Discharge Instructions  Discharge Instructions     Call MD for:  difficulty breathing, headache or visual disturbances   Complete by: As directed  Call MD for:  extreme fatigue   Complete by: As directed    Call MD for:  hives   Complete by: As directed    Call MD for:  persistant dizziness or light-headedness   Complete by: As directed    Call MD for:  persistant nausea and vomiting   Complete by: As directed    Call MD for:  redness, tenderness, or signs of infection (pain, swelling, redness, odor or green/yellow discharge around incision site)   Complete by: As directed    Call MD for:  severe uncontrolled pain   Complete by: As directed    Call MD for:  temperature >100.4   Complete by: As directed    Diet - low sodium heart healthy   Complete by: As directed    Discharge instructions    Complete by: As directed    You were cared for by a hospitalist during your hospital stay. If you have any questions about your discharge medications or the care you received while you were in the hospital after you are discharged, you can call the unit and ask to speak with the hospitalist on call if the hospitalist that took care of you is not available. Once you are discharged, your primary care physician will handle any further medical issues. Please note that NO REFILLS for any discharge medications will be authorized once you are discharged, as it is imperative that you return to your primary care physician (or establish a relationship with a primary care physician if you do not have one) for your aftercare needs so that they can reassess your need for medications and monitor your lab values.  Follow up with PCP and Neurology as an outpatient. Take all medications as prescribed. If symptoms change or worsen please return to the ED for evaluation   Increase activity slowly   Complete by: As directed       Allergies as of 02/13/2021   No Known Allergies      Medication List     TAKE these medications    acetaminophen 325 MG tablet Commonly known as: TYLENOL Take 2 tablets (650 mg total) by mouth every 6 (six) hours as needed for mild pain (or Fever >/= 101).   amLODipine 10 MG tablet Commonly known as: NORVASC Take 1 tablet (10 mg total) by mouth daily.   amoxicillin-clavulanate 875-125 MG tablet Commonly known as: Augmentin Take 1 tablet by mouth 2 (two) times daily for 7 days.   aspirin EC 81 MG tablet Take 1 tablet (81 mg total) by mouth daily.   atorvastatin 20 MG tablet Commonly known as: LIPITOR Take 1 tablet (20 mg total) by mouth daily.   ferrous sulfate 325 (65 FE) MG tablet Take 1 tablet (325 mg total) by mouth daily with breakfast.   gabapentin 100 MG capsule Commonly known as: NEURONTIN Take 1 capsule (100 mg total) by mouth 3 (three) times daily.    glipiZIDE 10 MG 24 hr tablet Commonly known as: GLUCOTROL XL Take 1 tablet (10 mg total) by mouth daily with breakfast.   hydrochlorothiazide 25 MG tablet Commonly known as: HYDRODIURIL Take 1 tablet (25 mg total) by mouth daily.   lisinopril 10 MG tablet Commonly known as: ZESTRIL Take 1 tablet (10 mg total) by mouth daily.   metFORMIN 500 MG tablet Commonly known as: GLUCOPHAGE Take 1 tablet (500 mg total) by mouth 2 (two) times daily.   metoprolol tartrate 50 MG tablet Commonly known as: LOPRESSOR TAKE 1  TABLET BY MOUTH  TWICE DAILY   ondansetron 4 MG tablet Commonly known as: ZOFRAN Take 1 tablet (4 mg total) by mouth every 6 (six) hours as needed for nausea.   OneTouch Ultra test strip Generic drug: glucose blood USE AS DIRECTED BY  PHYSICIAN   sitaGLIPtin 100 MG tablet Commonly known as: Januvia Take 1 tablet (100 mg total) by mouth daily.       No Known Allergies  Consultations: Spoke with ID Dr. Juleen China for further evaluation and recommendations.  Procedures/Studies: DG Knee 1-2 Views Right  Result Date: 02/13/2021 CLINICAL DATA:  Pain status post remote amputation. History of diabetes and peripheral vascular disease. EXAM: RIGHT KNEE - 1-2 VIEW COMPARISON:  None. FINDINGS: Signs of below the knee amputation identified. There are no focal bone erosions identified to suggest osteomyelitis. No acute fracture or dislocation. Atherosclerotic calcifications are identified within the soft tissues. IMPRESSION: 1. No acute findings. 2. Status post below the knee amputation. 3. Atherosclerosis. Electronically Signed   By: Kerby Moors M.D.   On: 02/13/2021 11:01   CT Head Wo Contrast  Result Date: 02/10/2021 CLINICAL DATA:  Altered mental status EXAM: CT HEAD WITHOUT CONTRAST TECHNIQUE: Contiguous axial images were obtained from the base of the skull through the vertex without intravenous contrast. COMPARISON:  None. FINDINGS: Brain: There is atrophy and chronic  small vessel disease changes. No acute intracranial abnormality. Specifically, no hemorrhage, hydrocephalus, mass lesion, acute infarction, or significant intracranial injury. Vascular: No hyperdense vessel or unexpected calcification. Skull: No acute calvarial abnormality. Sinuses/Orbits: No acute findings Other: None IMPRESSION: Atrophy, chronic microvascular disease. No acute intracranial abnormality. Electronically Signed   By: Rolm Baptise M.D.   On: 02/10/2021 18:49   DG Chest Portable 1 View  Result Date: 02/10/2021 CLINICAL DATA:  Altered mental status EXAM: PORTABLE CHEST 1 VIEW COMPARISON:  None. FINDINGS: Heart and mediastinal contours are within normal limits. No focal opacities or effusions. No acute bony abnormality. IMPRESSION: No active disease. Electronically Signed   By: Rolm Baptise M.D.   On: 02/10/2021 19:35    Subjective: Seen and examined at bedside and he was awake and alert and oriented x1.  Was at his baseline.  The nurse tech said that he is a little restless but felt okay.  He denied any complaints and had no chest pain, shortness breath, nausea, vomiting, diarrhea, burning or discomfort in his urine.  Was deemed medically stable to be discharged and follow-up with PCP in the outpatient setting as well as neurology and wife was called and updated and she had no questions.  Discharge Exam: Vitals:   02/12/21 2344 02/13/21 0414  BP: (!) 141/75 (!) 151/73  Pulse: 65 69  Resp: 16 17  Temp: 97.6 F (36.4 C) 97.7 F (36.5 C)  SpO2: 100% 100%   Vitals:   02/12/21 1132 02/12/21 1831 02/12/21 2344 02/13/21 0414  BP: 134/71 126/64 (!) 141/75 (!) 151/73  Pulse: 74 76 65 69  Resp: _0 Temp: 98.2 F (36.8 C) 98.1 F (36.7 C) 97.6 F (36.4 C) 97.7 F (36.5 C)  TempSrc: Oral     SpO2: 100% 100% 100% 100%  Weight:      Height:       General: Pt is alert, awake, not in acute distress; only oriented x1 Cardiovascular: RRR, S1/S2 +, no rubs, no  gallops Respiratory: Slightly diminished bilaterally, no wheezing, no rhonchi; unlabored breathing and not wearing supplemental extremities again Abdominal: Soft, NT, ND, bowel  sounds + Extremities: no edema, no cyanosis; has a right BKA  The results of significant diagnostics from this hospitalization (including imaging, microbiology, ancillary and laboratory) are listed below for reference.    Microbiology: Recent Results (from the past 240 hour(s))  Resp Panel by RT-PCR (Flu A&B, Covid) Nasopharyngeal Swab     Status: None   Collection Time: 02/10/21  7:50 PM   Specimen: Nasopharyngeal Swab; Nasopharyngeal(NP) swabs in vial transport medium  Result Value Ref Range Status   SARS Coronavirus 2 by RT PCR NEGATIVE NEGATIVE Final    Comment: (NOTE) SARS-CoV-2 target nucleic acids are NOT DETECTED.  The SARS-CoV-2 RNA is generally detectable in upper respiratory specimens during the acute phase of infection. The lowest concentration of SARS-CoV-2 viral copies this assay can detect is 138 copies/mL. A negative result does not preclude SARS-Cov-2 infection and should not be used as the sole basis for treatment or other patient management decisions. A negative result may occur with  improper specimen collection/handling, submission of specimen other than nasopharyngeal swab, presence of viral mutation(s) within the areas targeted by this assay, and inadequate number of viral copies(<138 copies/mL). A negative result must be combined with clinical observations, patient history, and epidemiological information. The expected result is Negative.  Fact Sheet for Patients:  EntrepreneurPulse.com.au  Fact Sheet for Healthcare Providers:  IncredibleEmployment.be  This test is no t yet approved or cleared by the [place] FDA and  has been authorized for detection and/or diagnosis of SARS-CoV-2 by FDA under an Emergency Use Authorization (EUA). This EUA  will remain  in effect (meaning this test can be used) for the duration of the COVID-19 declaration under Section 564(b)(1) of the Act, 21 U.S.C.section 360bbb-3(b)(1), unless the authorization is terminated  or revoked sooner.       Influenza A by PCR NEGATIVE NEGATIVE Final   Influenza B by PCR NEGATIVE NEGATIVE Final    Comment: (NOTE) The Xpert Xpress SARS-CoV-2/FLU/RSV plus assay is intended as an aid in the diagnosis of influenza from Nasopharyngeal swab specimens and should not be used as a sole basis for treatment. Nasal washings and aspirates are unacceptable for Xpert Xpress SARS-CoV-2/FLU/RSV testing.  Fact Sheet for Patients: EntrepreneurPulse.com.au  Fact Sheet for Healthcare Providers: IncredibleEmployment.be  This test is not yet approved or cleared by the [place] FDA and has been authorized for detection and/or diagnosis of SARS-CoV-2 by FDA under an Emergency Use Authorization (EUA). This EUA will remain in effect (meaning this test can be used) for the duration of the COVID-19 declaration under Section 564(b)(1) of the Act, 21 U.S.C. section 360bbb-3(b)(1), unless the authorization is terminated or revoked.  Performed at [facility], [address] [place],  [address]   Blood culture (routine x 2)     Status: None (Preliminary result)   Collection Time: 02/10/21  8:50 PM   Specimen: BLOOD  Result Value Ref Range Status   Specimen Description BLOOD LEFT ANTECUBITAL  Final   Special Requests   Final    BOTTLES DRAWN AEROBIC AND ANAEROBIC Blood Culture results may not be optimal due to an inadequate volume of blood received in culture bottles   Culture   Final    NO GROWTH 2 DAYS Performed at [facility], [address] [place],  [address]    Report Status PENDING  Incomplete  Blood culture (routine x 2)     Status: None (Preliminary result)   Collection Time: 02/10/21  8:50 PM    Specimen:  BLOOD  Result Value Ref Range Status   Specimen Description BLOOD BLOOD LEFT HAND  Final   Special Requests   Final    BOTTLES DRAWN AEROBIC ONLY Blood Culture results may not be optimal due to an inadequate volume of blood received in culture bottles   Culture   Final    NO GROWTH 2 DAYS Performed at [facility], [address], [place], [place] [address]    Report Status PENDING  Incomplete  Surgical pcr screen     Status: None   Collection Time: 02/11/21 11:20 AM   Specimen: Nasal Mucosa; Nasal Swab  Result Value Ref Range Status   MRSA, PCR NEGATIVE NEGATIVE Final   Staphylococcus aureus NEGATIVE NEGATIVE Final    Comment: (NOTE) The Xpert SA Assay (FDA approved for NASAL specimens in patients 73 years of age and older), is one component of a comprehensive surveillance program. It is not intended to diagnose infection nor to guide or monitor treatment. Performed at [facility], [address] [place], [place] [address]     Labs: BNP (last 3 results) No results for input(s): BNP in the last 8760 hours. Basic Metabolic Panel: Recent Labs  Lab 02/10/21 1836 02/10/21 2327 02/11/21 0900 02/11/21 2130 02/12/21 0335 02/13/21 0431  NA 132*  --  137 136 139 137  K 3.8  --  3.4* 3.8 3.5 4.0  CL 100  --  102 104 106 105  CO2 28  --  _0 GLUCOSE 382*  --  118* 245* 131* 215*  BUN 16  --  _1 CREATININE 0.90 0.80 0.76 0.73 0.58* 0.62  CALCIUM 8.8*  --  8.6* 8.3* 8.4* 8.4*  MG  --   --  1.3*  --  1.8 1.8  PHOS  --   --  2.4*  --  2.3* 2.8   Liver Function Tests: Recent Labs  Lab 02/10/21 1836 02/11/21 0900 02/11/21 2130 02/12/21 0335 02/13/21 0431  AST _2 36  ALT _3 ALKPHOS 45 38 38 40 41  BILITOT 0.8 1.0 0.9 0.9 1.2  PROT 7.2 6.2* 6.3* 6.4* 6.3*  ALBUMIN 4.1 3.4* 3.4* 3.0* 3.4*   No results for input(s): LIPASE, AMYLASE in the last 168 hours. No results for input(s): AMMONIA in the last  168 hours. CBC: Recent Labs  Lab 02/10/21 1836 02/10/21 2327 02/11/21 0559 02/12/21 0335 02/13/21 0431  WBC 18.7* 18.0* 14.6* 9.2 6.9  NEUTROABS 16.7*  --   --  6.8 4.4  HGB 9.8* 9.5* 9.6* 9.8* 9.4*  HCT 28.6* 27.5* 28.0* 29.1* 28.1*  MCV 90.2 89.6 89.2 87.9 88.6  PLT 185 183 188 190 191   Cardiac Enzymes: No results for input(s): CKTOTAL, CKMB, CKMBINDEX, TROPONINI in the last 168 hours. BNP: Invalid input(s): POCBNP CBG: Recent Labs  Lab 02/12/21 1134 02/12/21 1722 02/12/21 2111 02/13/21 0849 02/13/21 1150  GLUCAP 265* 215* 196* 297* 292*   D-Dimer No results for input(s): DDIMER in the last 72 hours. Hgb A1c No results for input(s): HGBA1C in the last 72 hours. Lipid Profile No results for input(s): CHOL, HDL, LDLCALC, TRIG, CHOLHDL, LDLDIRECT in the last 72 hours. Thyroid function studies No results for input(s): TSH, T4TOTAL, T3FREE, THYROIDAB in the last 72 hours.  Invalid input(s): FREET3 Anemia work up Recent Labs    02/13/21 0431  VITAMINB12 238  FOLATE 21.5  FERRITIN 218  TIBC 179*  IRON  71  RETICCTPCT 3.1   Urinalysis    Component Value Date/Time   COLORURINE YELLOW (A) 02/10/2021 1836   APPEARANCEUR CLEAR (A) 02/10/2021 1836   LABSPEC 1.023 02/10/2021 1836   PHURINE 5.0 02/10/2021 1836   GLUCOSEU >=500 (A) 02/10/2021 1836   HGBUR MODERATE (A) 02/10/2021 1836   BILIRUBINUR NEGATIVE 02/10/2021 1836   KETONESUR NEGATIVE 02/10/2021 1836   PROTEINUR NEGATIVE 02/10/2021 1836   NITRITE NEGATIVE 02/10/2021 1836   LEUKOCYTESUR NEGATIVE 02/10/2021 1836   Sepsis Labs Invalid input(s): PROCALCITONIN,  WBC,  LACTICIDVEN Microbiology Recent Results (from the past 240 hour(s))  Resp Panel by RT-PCR (Flu A&B, Covid) Nasopharyngeal Swab     Status: None   Collection Time: 02/10/21  7:50 PM   Specimen: Nasopharyngeal Swab; Nasopharyngeal(NP) swabs in vial transport medium  Result Value Ref Range Status   SARS Coronavirus 2 by RT PCR NEGATIVE  NEGATIVE Final    Comment: (NOTE) SARS-CoV-2 target nucleic acids are NOT DETECTED.  The SARS-CoV-2 RNA is generally detectable in upper respiratory specimens during the acute phase of infection. The lowest concentration of SARS-CoV-2 viral copies this assay can detect is 138 copies/mL. A negative result does not preclude SARS-Cov-2 infection and should not be used as the sole basis for treatment or other patient management decisions. A negative result may occur with  improper specimen collection/handling, submission of specimen other than nasopharyngeal swab, presence of viral mutation(s) within the areas targeted by this assay, and inadequate number of viral copies(<138 copies/mL). A negative result must be combined with clinical observations, patient history, and epidemiological information. The expected result is Negative.  Fact Sheet for Patients:  EntrepreneurPulse.com.au  Fact Sheet for Healthcare Providers:  IncredibleEmployment.be  This test is no t yet approved or cleared by the [place] FDA and  has been authorized for detection and/or diagnosis of SARS-CoV-2 by FDA under an Emergency Use Authorization (EUA). This EUA will remain  in effect (meaning this test can be used) for the duration of the COVID-19 declaration under Section 564(b)(1) of the Act, 21 U.S.C.section 360bbb-3(b)(1), unless the authorization is terminated  or revoked sooner.       Influenza A by PCR NEGATIVE NEGATIVE Final   Influenza B by PCR NEGATIVE NEGATIVE Final    Comment: (NOTE) The Xpert Xpress SARS-CoV-2/FLU/RSV plus assay is intended as an aid in the diagnosis of influenza from Nasopharyngeal swab specimens and should not be used as a sole basis for treatment. Nasal washings and aspirates are unacceptable for Xpert Xpress SARS-CoV-2/FLU/RSV testing.  Fact Sheet for Patients: EntrepreneurPulse.com.au  Fact Sheet for Healthcare  Providers: IncredibleEmployment.be  This test is not yet approved or cleared by the [place] FDA and has been authorized for detection and/or diagnosis of SARS-CoV-2 by FDA under an Emergency Use Authorization (EUA). This EUA will remain in effect (meaning this test can be used) for the duration of the COVID-19 declaration under Section 564(b)(1) of the Act, 21 U.S.C. section 360bbb-3(b)(1), unless the authorization is terminated or revoked.  Performed at [facility], [address] [place], [place] [address]   Blood culture (routine x 2)     Status: None (Preliminary result)   Collection Time: 02/10/21  8:50 PM   Specimen: BLOOD  Result Value Ref Range Status   Specimen Description BLOOD LEFT ANTECUBITAL  Final   Special Requests   Final    BOTTLES DRAWN AEROBIC AND ANAEROBIC Blood Culture results may not be optimal due to an inadequate volume of blood received in  culture bottles   Culture   Final    NO GROWTH 2 DAYS Performed at [facility], [address] [place], [place] [address]    Report Status PENDING  Incomplete  Blood culture (routine x 2)     Status: None (Preliminary result)   Collection Time: 02/10/21  8:50 PM   Specimen: BLOOD  Result Value Ref Range Status   Specimen Description BLOOD BLOOD LEFT HAND  Final   Special Requests   Final    BOTTLES DRAWN AEROBIC ONLY Blood Culture results may not be optimal due to an inadequate volume of blood received in culture bottles   Culture   Final    NO GROWTH 2 DAYS Performed at [facility], [address] [place], [place] [address]    Report Status PENDING  Incomplete  Surgical pcr screen     Status: None   Collection Time: 02/11/21 11:20 AM   Specimen: Nasal Mucosa; Nasal Swab  Result Value Ref Range Status   MRSA, PCR NEGATIVE NEGATIVE Final   Staphylococcus aureus NEGATIVE NEGATIVE Final    Comment: (NOTE) The Xpert SA Assay (FDA approved for NASAL  specimens in patients 44 years of age and older), is one component of a comprehensive surveillance program. It is not intended to diagnose infection nor to guide or monitor treatment. Performed at [facility], [address], [place], [place] [address]    Time coordinating discharge: 35 minutes  SIGNED:  Kerney Elbe, DO [facility] 02/13/2021, 12:36 PM Pager is on [place]  If 7PM-7AM, please contact night-coverage [email or web address]

## 2021-02-14 LAB — BLOOD CULTURE ID PANEL (REFLEXED) - BCID2
A.calcoaceticus-baumannii: NOT DETECTED
Bacteroides fragilis: NOT DETECTED
Candida albicans: NOT DETECTED
Candida auris: NOT DETECTED
Candida glabrata: NOT DETECTED
Candida krusei: NOT DETECTED
Candida parapsilosis: NOT DETECTED
Candida tropicalis: NOT DETECTED
Cryptococcus neoformans/gattii: NOT DETECTED
Enterobacter cloacae complex: NOT DETECTED
Enterobacterales: NOT DETECTED
Enterococcus Faecium: NOT DETECTED
Enterococcus faecalis: NOT DETECTED
Escherichia coli: NOT DETECTED
Haemophilus influenzae: NOT DETECTED
Klebsiella aerogenes: NOT DETECTED
Klebsiella oxytoca: NOT DETECTED
Klebsiella pneumoniae: NOT DETECTED
Listeria monocytogenes: NOT DETECTED
Neisseria meningitidis: NOT DETECTED
Proteus species: NOT DETECTED
Pseudomonas aeruginosa: NOT DETECTED
Salmonella species: NOT DETECTED
Serratia marcescens: NOT DETECTED
Staphylococcus epidermidis: NOT DETECTED
Staphylococcus lugdunensis: NOT DETECTED
Staphylococcus species: NOT DETECTED
Stenotrophomonas maltophilia: NOT DETECTED
Streptococcus agalactiae: NOT DETECTED
Streptococcus pneumoniae: NOT DETECTED
Streptococcus pyogenes: NOT DETECTED
Streptococcus species: NOT DETECTED

## 2021-02-15 ENCOUNTER — Telehealth: Payer: Self-pay | Admitting: Family Medicine

## 2021-02-15 ENCOUNTER — Telehealth: Payer: Self-pay

## 2021-02-15 DIAGNOSIS — G9340 Encephalopathy, unspecified: Secondary | ICD-10-CM | POA: Diagnosis not present

## 2021-02-15 DIAGNOSIS — I10 Essential (primary) hypertension: Secondary | ICD-10-CM | POA: Diagnosis not present

## 2021-02-15 DIAGNOSIS — Z89511 Acquired absence of right leg below knee: Secondary | ICD-10-CM | POA: Diagnosis not present

## 2021-02-15 DIAGNOSIS — E876 Hypokalemia: Secondary | ICD-10-CM | POA: Diagnosis not present

## 2021-02-15 DIAGNOSIS — A419 Sepsis, unspecified organism: Secondary | ICD-10-CM | POA: Diagnosis not present

## 2021-02-15 DIAGNOSIS — E785 Hyperlipidemia, unspecified: Secondary | ICD-10-CM | POA: Diagnosis not present

## 2021-02-15 DIAGNOSIS — D649 Anemia, unspecified: Secondary | ICD-10-CM | POA: Diagnosis not present

## 2021-02-15 DIAGNOSIS — E871 Hypo-osmolality and hyponatremia: Secondary | ICD-10-CM | POA: Diagnosis not present

## 2021-02-15 DIAGNOSIS — E1151 Type 2 diabetes mellitus with diabetic peripheral angiopathy without gangrene: Secondary | ICD-10-CM | POA: Diagnosis not present

## 2021-02-15 DIAGNOSIS — Z9181 History of falling: Secondary | ICD-10-CM | POA: Diagnosis not present

## 2021-02-15 LAB — CULTURE, BLOOD (ROUTINE X 2): Culture: NO GROWTH

## 2021-02-15 NOTE — Telephone Encounter (Signed)
General/Other Bess Kinds from [facility] requesting 4 wheel rollator walker with brakes for patient. Please call back at [phone]

## 2021-02-15 NOTE — Telephone Encounter (Signed)
Home Health Verbal Orders - Caller/Agency:shamaina from [facility] Number: [phone] Requesting:PT Frequency: 1x6

## 2021-02-15 NOTE — Telephone Encounter (Signed)
Pts wife attempted to call back [phone]

## 2021-02-15 NOTE — Telephone Encounter (Signed)
Transition Care Management Unsuccessful Follow-up Telephone Call  Date of discharge and from where:  [facility] 02/13/21  Attempts:  1st Attempt  Reason for unsuccessful TCM follow-up call:  Left voice message    

## 2021-02-15 NOTE — Telephone Encounter (Signed)
Original request routed to wrong office. Sent to PCP

## 2021-02-16 NOTE — Telephone Encounter (Signed)
Transition Care Management Follow-up Telephone Call Date of discharge and from where: 02/13/21 [facility] How have you been since you were released from the hospital? Pt's wife  Any questions or concerns? No  Items Reviewed: Did the pt receive and understand the discharge instructions provided? Yes  Medications obtained and verified? Yes  Other? No  Any new allergies since your discharge? No  Dietary orders reviewed? Yes Do you have support at home? Yes   Home Care and Equipment/Supplies: Were home health services ordered? yes If so, what is the name of the agency? [facility]  Has the agency set up a time to come to the patient's home? yes Were any new equipment or medical supplies ordered?  Yes: walker rollator What is the name of the medical supply agency? Pt needs rx from PCP sent to DME supplier. [employer] preferred DME supplier [employer]. Were you able to get the supplies/equipment? no Do you have any questions related to the use of the equipment or supplies? No  Functional Questionnaire: (I = Independent and D = Dependent) ADLs: I with assistance  Bathing/Dressing- I with assistance  Meal Prep- D  Eating- I  Maintaining continence- I  Transferring/Ambulation- I   Managing Meds- D  Follow up appointments reviewed:  PCP Hospital f/u appt confirmed? Yes  Scheduled to see Dr. Yetta Barre on 02/19/21 @ 3:00. Specialist Hospital f/u appt confirmed? No   Are transportation arrangements needed? No  If their condition worsens, is the pt aware to call PCP or go to the Emergency Dept.? Yes Was the patient provided with contact information for the PCP's office or ED? Yes Was to pt encouraged to call back with questions or concerns? Yes

## 2021-02-17 ENCOUNTER — Telehealth: Payer: Self-pay

## 2021-02-17 LAB — CULTURE, BLOOD (ROUTINE X 2)

## 2021-02-17 NOTE — Telephone Encounter (Signed)
Copied from CRM [id number]. Topic: General - Other >> Feb 17, 2021  3:25 PM Marylen Ponto wrote: Reason for CRM: Blossom Hoops a PT with [facility] stated she spoke with Dr. Yetta Barre' nurse previously and she would like for the nurse to return her call. Cb# [phone]

## 2021-02-17 NOTE — Telephone Encounter (Signed)
Spoke to [place]- daughter will be bringing pt in on Friday. I advised her to go and pick out the walker they want from an equipment store and have the store request a Rx from [place]. Or she can let us know on Friday which walker they want and Yetta Barre can write a Rx for it.

## 2021-02-18 ENCOUNTER — Other Ambulatory Visit: Payer: Self-pay

## 2021-02-18 DIAGNOSIS — R7881 Bacteremia: Secondary | ICD-10-CM

## 2021-02-19 ENCOUNTER — Other Ambulatory Visit: Payer: Self-pay

## 2021-02-19 ENCOUNTER — Encounter: Payer: Self-pay | Admitting: Family Medicine

## 2021-02-19 ENCOUNTER — Ambulatory Visit (INDEPENDENT_AMBULATORY_CARE_PROVIDER_SITE_OTHER): Payer: Medicare Other | Admitting: Family Medicine

## 2021-02-19 VITALS — BP 140/88 | HR 84 | Ht 72.0 in | Wt 146.0 lb

## 2021-02-19 DIAGNOSIS — I10 Essential (primary) hypertension: Secondary | ICD-10-CM | POA: Diagnosis not present

## 2021-02-19 DIAGNOSIS — E876 Hypokalemia: Secondary | ICD-10-CM | POA: Diagnosis not present

## 2021-02-19 DIAGNOSIS — D508 Other iron deficiency anemias: Secondary | ICD-10-CM

## 2021-02-19 DIAGNOSIS — R7881 Bacteremia: Secondary | ICD-10-CM | POA: Diagnosis not present

## 2021-02-19 DIAGNOSIS — E1159 Type 2 diabetes mellitus with other circulatory complications: Secondary | ICD-10-CM | POA: Diagnosis not present

## 2021-02-19 DIAGNOSIS — Z89511 Acquired absence of right leg below knee: Secondary | ICD-10-CM | POA: Diagnosis not present

## 2021-02-19 DIAGNOSIS — D649 Anemia, unspecified: Secondary | ICD-10-CM | POA: Diagnosis not present

## 2021-02-19 DIAGNOSIS — E7801 Familial hypercholesterolemia: Secondary | ICD-10-CM

## 2021-02-19 DIAGNOSIS — E78019 Familial hypercholesterolemia, unspecified: Secondary | ICD-10-CM

## 2021-02-19 DIAGNOSIS — E785 Hyperlipidemia, unspecified: Secondary | ICD-10-CM | POA: Diagnosis not present

## 2021-02-19 DIAGNOSIS — Z09 Encounter for follow-up examination after completed treatment for conditions other than malignant neoplasm: Secondary | ICD-10-CM | POA: Diagnosis not present

## 2021-02-19 DIAGNOSIS — E1151 Type 2 diabetes mellitus with diabetic peripheral angiopathy without gangrene: Secondary | ICD-10-CM | POA: Diagnosis not present

## 2021-02-19 DIAGNOSIS — A419 Sepsis, unspecified organism: Secondary | ICD-10-CM | POA: Diagnosis not present

## 2021-02-19 DIAGNOSIS — Z9181 History of falling: Secondary | ICD-10-CM | POA: Diagnosis not present

## 2021-02-19 DIAGNOSIS — E0849 Diabetes mellitus due to underlying condition with other diabetic neurological complication: Secondary | ICD-10-CM

## 2021-02-19 DIAGNOSIS — G9340 Encephalopathy, unspecified: Secondary | ICD-10-CM | POA: Diagnosis not present

## 2021-02-19 DIAGNOSIS — E871 Hypo-osmolality and hyponatremia: Secondary | ICD-10-CM | POA: Diagnosis not present

## 2021-02-19 MED ORDER — AMLODIPINE BESYLATE 10 MG PO TABS: 10.0000 mg | ORAL_TABLET | Freq: Every day | ORAL | 1 refills | Status: DC

## 2021-02-19 MED ORDER — GLIPIZIDE ER 10 MG PO TB24: 10.0000 mg | ORAL_TABLET | Freq: Every day | ORAL | 1 refills | Status: AC

## 2021-02-19 MED ORDER — SITAGLIPTIN PHOSPHATE 100 MG PO TABS: 100.0000 mg | ORAL_TABLET | Freq: Every day | ORAL | 1 refills | Status: AC

## 2021-02-19 MED ORDER — HYDROCHLOROTHIAZIDE 25 MG PO TABS: 25.0000 mg | ORAL_TABLET | Freq: Every day | ORAL | 1 refills | Status: DC

## 2021-02-19 MED ORDER — LISINOPRIL 10 MG PO TABS: 10.0000 mg | ORAL_TABLET | Freq: Every day | ORAL | 1 refills | Status: DC

## 2021-02-19 MED ORDER — ATORVASTATIN CALCIUM 20 MG PO TABS: 20.0000 mg | ORAL_TABLET | Freq: Every day | ORAL | 1 refills | Status: DC

## 2021-02-19 MED ORDER — METOPROLOL TARTRATE 50 MG PO TABS: 50.0000 mg | ORAL_TABLET | Freq: Two times a day (BID) | ORAL | 1 refills | Status: DC

## 2021-02-19 MED ORDER — ATORVASTATIN CALCIUM 20 MG PO TABS: 20.0000 mg | ORAL_TABLET | Freq: Every day | ORAL | 2 refills | Status: DC

## 2021-02-19 MED ORDER — GLIPIZIDE ER 10 MG PO TB24: 10.0000 mg | ORAL_TABLET | Freq: Every day | ORAL | 1 refills | Status: DC

## 2021-02-19 MED ORDER — SITAGLIPTIN PHOSPHATE 100 MG PO TABS: 100.0000 mg | ORAL_TABLET | Freq: Every day | ORAL | 1 refills | Status: DC

## 2021-02-19 NOTE — Progress Notes (Addendum)
Date:  02/19/2021   Name:  Jose Schroeder   DOB:  1932/11/29   MRN:  [id number]   Chief Complaint: face to face (For home health)  Patient is a 87 year old male who presents for a hospital discharge followup exam. The patient reports the following problems: none presently. Health maintenance has been reviewed up to date.      Lab Results  Component Value Date   CREATININE 0.62 02/13/2021   BUN 11 02/13/2021   NA 137 02/13/2021   K 4.0 02/13/2021   CL 105 02/13/2021   CO2 27 02/13/2021   Lab Results  Component Value Date   CHOL 123 09/08/2020   HDL 48 09/08/2020   LDLCALC 59 09/08/2020   TRIG 82 09/08/2020   CHOLHDL 2.6 09/08/2020   No results found for: TSH Lab Results  Component Value Date   HGBA1C 8.7 (H) 01/12/2021   Lab Results  Component Value Date   WBC 6.9 02/13/2021   HGB 9.4 (L) 02/13/2021   HCT 28.1 (L) 02/13/2021   MCV 88.6 02/13/2021   PLT 191 02/13/2021   Lab Results  Component Value Date   ALT 24 02/13/2021   AST 36 02/13/2021   ALKPHOS 41 02/13/2021   BILITOT 1.2 02/13/2021     Review of Systems  Constitutional:  Negative for chills and fever.  HENT:  Negative for drooling, ear discharge, ear pain and sore throat.   Respiratory:  Negative for cough, shortness of breath and wheezing.   Cardiovascular:  Negative for chest pain, palpitations and leg swelling.  Gastrointestinal:  Negative for abdominal pain, blood in stool, constipation, diarrhea and nausea.  Endocrine: Negative for polydipsia.  Genitourinary:  Negative for dysuria, frequency, hematuria and urgency.  Musculoskeletal:  Negative for back pain, myalgias and neck pain.  Skin:  Negative for rash.  Allergic/Immunologic: Negative for environmental allergies.  Neurological:  Negative for dizziness and headaches.  Hematological:  Does not bruise/bleed easily.  Psychiatric/Behavioral:  Negative for suicidal ideas. The patient is not nervous/anxious.    Patient Active Problem  List   Diagnosis Date Noted   Sepsis (HCC) 02/10/2021   Delirium 02/10/2021   Hyponatremia 02/10/2021   Atherosclerosis of native arteries of extremity with intermittent claudication (HCC) 03/30/2020   BKA stump complication (HCC) 03/30/2020   Anemia 10/01/2019   Familial hypercholesterolemia 10/01/2019   Acquired absence of right leg below knee (HCC) 09/13/2018   Amputated below knee, right (HCC) 05/09/2017   PVD (peripheral vascular disease) (HCC) 12/15/2016   Hx of BKA, 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   Ulcer of right foot (HCC) 01/31/2016   Amputated great toe (HCC) 01/11/2016   Toe infection 01/04/2016   Diabetes mellitus (HCC) 09/24/2015   Essential hypertension 09/24/2015    No Known Allergies  Past Surgical History:  Procedure Laterality Date   AMPUTATION TOE Right 01/15/2016   Procedure: AMPUTATION TOE;  Surgeon: Gwyneth Revels, DPM;  Location: [facility] ORS;  Service: Podiatry;  Laterality: Right;   PERIPHERAL VASCULAR CATHETERIZATION Right 01/07/2016   Procedure: Abdominal Aortogram w/Lower Extremity;  Surgeon: Renford Dills, MD;  Location: [facility] INVASIVE CV LAB;  Service: Cardiovascular;  Laterality: Right;   TOE SURGERY     amputated 2 toes on L) foot    Social History   Tobacco Use   Smoking status: Never   Smokeless tobacco: Never   Tobacco comments:    Smoking cessation materials not required  Vaping Use   Vaping Use: Never used  Substance  Use Topics   Alcohol use: No    Alcohol/week: 0.0 standard drinks   Drug use: No     Medication list has been reviewed and updated.  Current Meds  Medication Sig   acetaminophen (TYLENOL) 325 MG tablet Take 2 tablets (650 mg total) by mouth every 6 (six) hours as needed for mild pain (or Fever >/= 101).   amLODipine (NORVASC) 10 MG tablet Take 1 tablet (10 mg total) by mouth daily.   aspirin EC 81 MG tablet Take 1 tablet (81 mg total) by mouth daily.   atorvastatin (LIPITOR) 20 MG tablet Take 1 tablet (20 mg total) by mouth  daily.   ferrous sulfate 325 (65 FE) MG tablet Take 1 tablet (325 mg total) by mouth daily with breakfast.   gabapentin (NEURONTIN) 100 MG capsule Take 1 capsule (100 mg total) by mouth 3 (three) times daily.   glipiZIDE (GLUCOTROL XL) 10 MG 24 hr tablet Take 1 tablet (10 mg total) by mouth daily with breakfast.   hydrochlorothiazide (HYDRODIURIL) 25 MG tablet Take 1 tablet (25 mg total) by mouth daily.   lisinopril (ZESTRIL) 10 MG tablet Take 1 tablet (10 mg total) by mouth daily.   metFORMIN (GLUCOPHAGE) 500 MG tablet Take 1 tablet (500 mg total) by mouth 2 (two) times daily.   metoprolol tartrate (LOPRESSOR) 50 MG tablet TAKE 1 TABLET BY MOUTH  TWICE DAILY   ONETOUCH ULTRA test strip USE AS DIRECTED BY  PHYSICIAN   sitaGLIPtin (JANUVIA) 100 MG tablet Take 1 tablet (100 mg total) by mouth daily.    PHQ 2/9 Scores 10/28/2020 12/05/2019 10/01/2019 09/30/2019  PHQ - 2 Score 0 0 0 0  PHQ- 9 Score - 0 0 -    GAD 7 : Generalized Anxiety Score 12/05/2019 10/01/2019  Nervous, Anxious, on Edge 0 0  Control/stop worrying 0 0  Worry too much - different things 0 0  Trouble relaxing 0 0  Restless 0 0  Easily annoyed or irritable 0 0  Afraid - awful might happen 0 0  Total GAD 7 Score 0 0  Anxiety Difficulty Not difficult at all -    BP Readings from Last 3 Encounters:  02/19/21 140/88  02/13/21 (!) 151/73  01/12/21 138/87    Physical Exam Vitals and nursing note reviewed.  HENT:     Head: Normocephalic.     Right Ear: Tympanic membrane and external ear normal.     Left Ear: Tympanic membrane and external ear normal.     Nose: Nose normal. No congestion or rhinorrhea.  Eyes:     General: No scleral icterus.       Right eye: No discharge.        Left eye: No discharge.     Conjunctiva/sclera: Conjunctivae normal.     Pupils: Pupils are equal, round, and reactive to light.  Neck:     Thyroid: No thyromegaly.     Vascular: No JVD.     Trachea: No tracheal deviation.  Cardiovascular:      Rate and Rhythm: Normal rate and regular rhythm.     Heart sounds: Normal heart sounds. No murmur heard.   No friction rub. No gallop.  Pulmonary:     Effort: No respiratory distress.     Breath sounds: Normal breath sounds. No wheezing, rhonchi or rales.  Abdominal:     General: Bowel sounds are normal.     Palpations: Abdomen is soft. There is no mass.     Tenderness: There  is no abdominal tenderness. There is no guarding or rebound.  Musculoskeletal:        General: No tenderness. Normal range of motion.     Cervical back: Normal range of motion and neck supple.  Lymphadenopathy:     Cervical: No cervical adenopathy.  Skin:    General: Skin is warm.     Findings: No rash.  Neurological:     Mental Status: He is alert and oriented to person, place, and time.     Cranial Nerves: No cranial nerve deficit.     Deep Tendon Reflexes: Reflexes are normal and symmetric.    Wt Readings from Last 3 Encounters:  02/19/21 146 lb (66.2 kg)  02/10/21 150 lb (68 kg)  01/12/21 145 lb (65.8 kg)    BP 140/88   Pulse 84   Ht 6' (1.829 m)   Wt 146 lb (66.2 kg)   BMI 19.80 kg/m   Assessment and Plan:  1. Hospital discharge follow-up Patient with hospital follow-up for sepsis.  Patient is doing well.  Review of hospitalization as well as imaging and laboratories was done.  2. Positive blood culture We received confirmation that there was a positive blood culture with a gram-negative rod which was a corynebacteria.  On review of his hospitalization he was treated with vancomycin as well as a cephalosporin I believe which would have been sufficient but I am not certain if he needs further evaluation as to the source of this infection and if any further treatment needs to be done at this time or follow-up cultures. - CBC with Differential/Platelet  3. Type 2 diabetes mellitus with other circulatory complication, without long-term current use of insulin (HCC) Chronic.  Controlled.  Stable.   Continue glipizide and Sitagliptin as well as metformin.  We will check renal function panel. - Renal Function Panel - glipiZIDE (GLUCOTROL XL) 10 MG 24 hr tablet; Take 1 tablet (10 mg total) by mouth daily with breakfast.  Dispense: 90 tablet; Refill: 1 - sitaGLIPtin (JANUVIA) 100 MG tablet; Take 1 tablet (100 mg total) by mouth daily.  Dispense: 90 tablet; Refill: 1  4. Other iron deficiency anemia Patient has a history of iron deficiency anemia for which he will have a CBC for evaluation in the meantime he has been encouraged to continue his iron supplementation. - CBC with Differential/Platelet  5. Essential hypertension Chronic.  Controlled.  Stable.  Blood pressure is 140/88.  Continue amlodipine 10 mg and hydrochlorothiazide 25 mg as well as metoprolol 50 mg twice a day and lisinopril 10 mg once a day. - amLODipine (NORVASC) 10 MG tablet; Take 1 tablet (10 mg total) by mouth daily.  Dispense: 90 tablet; Refill: 1 - hydrochlorothiazide (HYDRODIURIL) 25 MG tablet; Take 1 tablet (25 mg total) by mouth daily.  Dispense: 90 tablet; Refill: 1 - lisinopril (ZESTRIL) 10 MG tablet; Take 1 tablet (10 mg total) by mouth daily.  Dispense: 90 tablet; Refill: 1 - metoprolol tartrate (LOPRESSOR) 50 MG tablet; Take 1 tablet (50 mg total) by mouth 2 (two) times daily.  Dispense: 180 tablet; Refill: 1  6. Familial hypercholesterolemia Chronic.  Controlled.  Stable.  Continue atorvastatin 20 mg once a day. - atorvastatin (LIPITOR) 20 MG tablet; Take 1 tablet (20 mg total) by mouth daily.  Dispense: 90 tablet; Refill: 1   Addendum to visit.  Patient also was a face-to-face for review disposition for unsteady gait and is post amputee.  Patient needs assistant with walking with a mobile assisted equipment  which involves a walker with designated modifications for assistance.

## 2021-02-20 LAB — RENAL FUNCTION PANEL
Albumin: 4.8 g/dL — ABNORMAL HIGH (ref 3.6–4.6)
BUN/Creatinine Ratio: 10 (ref 10–24)
BUN: 9 mg/dL (ref 8–27)
CO2: 27 mmol/L (ref 20–29)
Calcium: 10 mg/dL (ref 8.6–10.2)
Chloride: 98 mmol/L (ref 96–106)
Creatinine, Ser: 0.88 mg/dL (ref 0.76–1.27)
Glucose: 289 mg/dL — ABNORMAL HIGH (ref 70–99)
Phosphorus: 3.4 mg/dL (ref 2.8–4.1)
Potassium: 4.5 mmol/L (ref 3.5–5.2)
Sodium: 140 mmol/L (ref 134–144)
eGFR: 84 mL/min/{1.73_m2} (ref >59)

## 2021-02-20 LAB — CBC WITH DIFFERENTIAL/PLATELET
Basophils Absolute: 0 10*3/uL (ref 0.0–0.2)
Basos: 1 %
EOS (ABSOLUTE): 0.1 10*3/uL (ref 0.0–0.4)
Eos: 2 %
Hematocrit: 31 % — ABNORMAL LOW (ref 37.5–51.0)
Hemoglobin: 10.3 g/dL — ABNORMAL LOW (ref 13.0–17.7)
Immature Grans (Abs): 0.1 10*3/uL (ref 0.0–0.1)
Immature Granulocytes: 1 %
Lymphocytes Absolute: 1.9 10*3/uL (ref 0.7–3.1)
Lymphs: 30 %
MCH: 29.5 pg (ref 26.6–33.0)
MCHC: 33.2 g/dL (ref 31.5–35.7)
MCV: 89 fL (ref 79–97)
Monocytes Absolute: 0.5 10*3/uL (ref 0.1–0.9)
Monocytes: 9 %
Neutrophils Absolute: 3.7 10*3/uL (ref 1.4–7.0)
Neutrophils: 57 %
Platelets: 322 10*3/uL (ref 150–450)
RBC: 3.49 x10E6/uL — ABNORMAL LOW (ref 4.14–5.80)
RDW: 11.9 % (ref 11.6–15.4)
WBC: 6.3 10*3/uL (ref 3.4–10.8)

## 2021-02-22 ENCOUNTER — Other Ambulatory Visit: Payer: Self-pay | Admitting: Family Medicine

## 2021-02-22 ENCOUNTER — Other Ambulatory Visit: Payer: Self-pay

## 2021-02-22 DIAGNOSIS — E0849 Diabetes mellitus due to underlying condition with other diabetic neurological complication: Secondary | ICD-10-CM

## 2021-02-22 DIAGNOSIS — I10 Essential (primary) hypertension: Secondary | ICD-10-CM

## 2021-02-22 MED ORDER — METOPROLOL TARTRATE 50 MG PO TABS: 50.0000 mg | ORAL_TABLET | Freq: Two times a day (BID) | ORAL | 0 refills | Status: DC

## 2021-02-22 NOTE — Telephone Encounter (Signed)
Requested Prescriptions  Pending Prescriptions Disp Refills  . gabapentin (NEURONTIN) 100 MG capsule [Pharmacy Med Name: Gabapentin 100 MG Oral Capsule] 90 capsule 0    Sig: TAKE 1 CAPSULE BY MOUTH 3  TIMES DAILY     Neurology: Anticonvulsants - gabapentin Passed - 02/22/2021  4:41 PM      Passed - Valid encounter within last 12 months    Recent Outpatient Visits          3 days ago Hospital discharge follow-up   [facility] Duanne Limerick, MD   1 month ago Need for immunization against influenza   [facility] Duanne Limerick, MD   5 months ago Type 2 diabetes mellitus with other circulatory complication, without long-term current use of insulin (HCC)   [facility] Duanne Limerick, MD   9 months ago Type 2 diabetes mellitus with other circulatory complication, without long-term current use of insulin (HCC)   [facility] Duanne Limerick, MD   10 months ago Type 2 diabetes mellitus with other circulatory complication, without long-term current use of insulin (HCC)   [facility] Duanne Limerick, MD      Future Appointments            Tomorrow Lynn Ito, MD [facility]   In 2 months Duanne Limerick, MD [facility]

## 2021-02-23 ENCOUNTER — Encounter: Payer: Self-pay | Admitting: Infectious Diseases

## 2021-02-23 ENCOUNTER — Other Ambulatory Visit: Payer: Self-pay

## 2021-02-23 ENCOUNTER — Ambulatory Visit: Payer: Medicare Other | Attending: Infectious Diseases | Admitting: Infectious Diseases

## 2021-02-23 VITALS — BP 145/81 | HR 71 | Temp 98.4°F | Resp 16 | Ht 72.0 in | Wt 146.0 lb

## 2021-02-23 DIAGNOSIS — Z89422 Acquired absence of other left toe(s): Secondary | ICD-10-CM | POA: Diagnosis not present

## 2021-02-23 DIAGNOSIS — I739 Peripheral vascular disease, unspecified: Secondary | ICD-10-CM | POA: Insufficient documentation

## 2021-02-23 DIAGNOSIS — Z09 Encounter for follow-up examination after completed treatment for conditions other than malignant neoplasm: Secondary | ICD-10-CM | POA: Insufficient documentation

## 2021-02-23 DIAGNOSIS — Z833 Family history of diabetes mellitus: Secondary | ICD-10-CM | POA: Insufficient documentation

## 2021-02-23 DIAGNOSIS — G3184 Mild cognitive impairment, so stated: Secondary | ICD-10-CM | POA: Diagnosis not present

## 2021-02-23 DIAGNOSIS — Z7984 Long term (current) use of oral hypoglycemic drugs: Secondary | ICD-10-CM | POA: Insufficient documentation

## 2021-02-23 DIAGNOSIS — E1151 Type 2 diabetes mellitus with diabetic peripheral angiopathy without gangrene: Secondary | ICD-10-CM | POA: Insufficient documentation

## 2021-02-23 DIAGNOSIS — I10 Essential (primary) hypertension: Secondary | ICD-10-CM | POA: Insufficient documentation

## 2021-02-23 DIAGNOSIS — R7881 Bacteremia: Secondary | ICD-10-CM

## 2021-02-23 DIAGNOSIS — Z89511 Acquired absence of right leg below knee: Secondary | ICD-10-CM | POA: Insufficient documentation

## 2021-02-23 DIAGNOSIS — Z7982 Long term (current) use of aspirin: Secondary | ICD-10-CM | POA: Insufficient documentation

## 2021-02-23 DIAGNOSIS — Z79899 Other long term (current) drug therapy: Secondary | ICD-10-CM | POA: Insufficient documentation

## 2021-02-23 DIAGNOSIS — Z8249 Family history of ischemic heart disease and other diseases of the circulatory system: Secondary | ICD-10-CM | POA: Diagnosis not present

## 2021-02-23 NOTE — Progress Notes (Signed)
NAME: Jose Schroeder  DOB: 09/29/1932  MRN: [id number]  Date/Time: 02/23/2021 10:08 AM  Subjective:  ? Jose Schroeder is a 87 y.o. with a history of DM, Htn, PVD Rt BKA Pt is referred to me by his PCP Dr.Jones for a positive blood culture corynebacterium when he was recently in the hospital Pt was hospitalized 02/10/21-02/13/21 for confusion and was found to be septic with no obvious source- He had high wbc, high procal, AKI. HE was treated with IV cefepime/flagyl and vance ( was stopped once MRSA was neg) He was discharged home on augmentin for 7 days and has completed treatment The blood culture drawn on 10/19 one bottle of 4 resulted as corynebacterium on 10/26. 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   Past Medical History:  Diagnosis Date   Diabetes mellitus without complication ([place])    Hypertension    Peripheral vascular disease ([place])     Past Surgical History:  Procedure Laterality Date   AMPUTATION TOE Right 01/15/2016   Procedure: AMPUTATION TOE;  Surgeon: Samara Deist, DPM;  Location: [facility] ORS;  Service: Podiatry;  Laterality: Right;   PERIPHERAL VASCULAR CATHETERIZATION Right 01/07/2016   Procedure: Abdominal Aortogram w/Lower Extremity;  Surgeon: Katha Cabal, MD;  Location: [place] CV [facility];  Service: Cardiovascular;  Laterality: Right;   TOE SURGERY     amputated 2 toes on L) foot   Rt BKA Social History   Socioeconomic History   Marital status: Married    Spouse name: Not on file   Number of children: 4   Years of education: Not on file   Highest education level: 8th grade  Occupational History   Occupation: retired  Tobacco Use   Smoking status: Never   Smokeless tobacco: Never   Tobacco comments:    Smoking cessation materials not  required  Vaping Use   Vaping Use: Never used  Substance and Sexual Activity   Alcohol use: No    Alcohol/week: 0.0 standard drinks   Drug use: No   Sexual activity: Not Currently  Other Topics Concern   Not on file  Social History Narrative   Not on file   Social Determinants of Health   Financial Resource Strain: Low Risk    Difficulty of Paying Living Expenses: Not very hard  Food Insecurity: No Food Insecurity   Worried About Charity fundraiser in the Last Year: Never true   Ran Out of Food in the Last Year: Never true  Transportation Needs: No Transportation Needs   Lack of Transportation (Medical): No   Lack of Transportation (Non-Medical): No  Physical Activity: Inactive   Days of Exercise per Week: 0 days   Minutes of Exercise per Session: 0 min  Stress: No Stress Concern Present   Feeling of Stress : Not at all  Social Connections: Moderately Isolated   Frequency of Communication with Friends and Family: More than three times a week   Frequency of Social Gatherings with Friends and Family: Not on file   Attends Religious Services: Never   Marine scientist or Organizations: No   Attends Archivist Meetings: Never   Marital Status: Married  Human resources officer Violence: Not At Risk   Fear of Current or Ex-Partner: No  Emotionally Abused: No   Physically Abused: No   Sexually Abused: No    Family History  Problem Relation Age of Onset   Diabetes Mother    Hypertension Mother    Diabetes Father    Hypertension Father    Diabetes Daughter    No Known Allergies I? Current Outpatient Medications  Medication Sig Dispense Refill   acetaminophen (TYLENOL) 325 MG tablet Take 2 tablets (650 mg total) by mouth every 6 (six) hours as needed for mild pain (or Fever >/= 101). 20 tablet 0   amLODipine (NORVASC) 10 MG tablet Take 1 tablet (10 mg total) by mouth daily. 90 tablet 1   aspirin EC 81 MG tablet Take 1 tablet (81 mg total) by mouth daily. 30  tablet 2   atorvastatin (LIPITOR) 20 MG tablet Take 1 tablet (20 mg total) by mouth daily. 90 tablet 1   ferrous sulfate 325 (65 FE) MG tablet Take 1 tablet (325 mg total) by mouth daily with breakfast. 90 tablet 1   gabapentin (NEURONTIN) 100 MG capsule TAKE 1 CAPSULE BY MOUTH 3  TIMES DAILY 90 capsule 0   glipiZIDE (GLUCOTROL XL) 10 MG 24 hr tablet Take 1 tablet (10 mg total) by mouth daily with breakfast. 90 tablet 1   hydrochlorothiazide (HYDRODIURIL) 25 MG tablet Take 1 tablet (25 mg total) by mouth daily. 90 tablet 1   lisinopril (ZESTRIL) 10 MG tablet Take 1 tablet (10 mg total) by mouth daily. 90 tablet 1   metFORMIN (GLUCOPHAGE) 500 MG tablet Take 1 tablet (500 mg total) by mouth 2 (two) times daily. 180 tablet 1   metoprolol tartrate (LOPRESSOR) 50 MG tablet Take 1 tablet (50 mg total) by mouth 2 (two) times daily. 28 tablet 0   ONETOUCH ULTRA test strip USE AS DIRECTED BY  PHYSICIAN 200 strip 5   sitaGLIPtin (JANUVIA) 100 MG tablet Take 1 tablet (100 mg total) by mouth daily. 90 tablet 1   No current facility-administered medications for this visit.     Abtx:  Anti-infectives (From admission, onward)    None       REVIEW OF SYSTEMS:  Const: negative fever, negative chills, negative weight loss Eyes: negative diplopia or visual changes, negative eye pain ENT: negative coryza, negative sore throat Resp: negative cough, hemoptysis, dyspnea Cards: negative for chest pain, palpitations, lower extremity edema GU: negative for frequency, dysuria and hematuria GI: Negative for abdominal pain, diarrhea, bleeding, constipation Skin: negative for rash and pruritus Heme: negative for easy bruising and gum/nose bleeding MS: general weakness Neurolo:, memory problems  Psych: negative for feelings of anxiety, depression  Endocrine:  diabetes Allergy/Immunology- NKDA Objective:  VITALS:  BP (!) 145/81   Pulse 71   Temp 98.4 F (36.9 C) (Oral)   Resp 16   Ht 6' (1.829 m)   Wt  146 lb (66.2 kg)   SpO2 97%   BMI 19.80 kg/m  PHYSICAL EXAM:  General: Alert, cooperative, no distress, appears stated age. Oriented in person, follows commands- knows his DOB, verbally appropriate, in a wheel chair Head: Normocephalic, without obvious abnormality, atraumatic. Eyes: Conjunctivae clear, anicteric sclerae. Pupils are equal ENT Nares normal. No drainage or sinus tenderness. Lips, mucosa, and tongue normal. No Thrush, poor dentition Neck: Supple, symmetrical, no adenopathy, thyroid: non tender no carotid bruit and no JVD. Back: No CVA tenderness. Lungs: Clear to auscultation bilaterally. No Wheezing or Rhonchi. No rales. Heart: Regular rate and rhythm, no murmur, rub or gallop. Abdomen: Soft, non-tender, Extremities:  rt BKA- has prosthetic leg Left foot- 4 th toe amputated- other wise no wounds or callus Skin: No rashes or lesions. Or bruising Lymph: Cervical, supraclavicular normal. Neurologic: moves all limbs Pertinent Labs Lab Results CBC    Component Value Date/Time   WBC 6.3 02/19/2021 1538   WBC 6.9 02/13/2021 0431   RBC 3.49 (L) 02/19/2021 1538   RBC 3.15 (L) 02/13/2021 0431   RBC 3.17 (L) 02/13/2021 0431   HGB 10.3 (L) 02/19/2021 1538   HCT 31.0 (L) 02/19/2021 1538   PLT 322 02/19/2021 1538   MCV 89 02/19/2021 1538   MCH 29.5 02/19/2021 1538   MCH 29.7 02/13/2021 0431   MCHC 33.2 02/19/2021 1538   MCHC 33.5 02/13/2021 0431   RDW 11.9 02/19/2021 1538   LYMPHSABS 1.9 02/19/2021 1538   MONOABS 0.7 02/13/2021 0431   EOSABS 0.1 02/19/2021 1538   BASOSABS 0.0 02/19/2021 1538    CMP Latest Ref Rng & Units 02/19/2021 02/13/2021 02/12/2021  Glucose 70 - 99 mg/dL 289(H) 215(H) 131(H)  BUN 8 - 27 mg/dL 9 11 9   Creatinine 0.76 - 1.27 mg/dL 0.88 0.62 0.58(L)  Sodium 134 - 144 mmol/L 140 137 139  Potassium 3.5 - 5.2 mmol/L 4.5 4.0 3.5  Chloride 96 - 106 mmol/L 98 105 106  CO2 20 - 29 mmol/L 27 27 27   Calcium 8.6 - 10.2 mg/dL 10.0 8.4(L) 8.4(L)  Total  Protein 6.5 - 8.1 g/dL - 6.3(L) 6.4(L)  Total Bilirubin 0.3 - 1.2 mg/dL - 1.2 0.9  Alkaline Phos 38 - 126 U/L - 41 40  AST 15 - 41 U/L - 36 28  ALT 0 - 44 U/L - 24 19     Micro 10/19 - 1 bottle of 4 corynebacterium  ? Impression/Recommendation ?corynebacterium in 1 of 4 bottles recently is a skin contaminant- no need to treat or do anything further  Recent hospitalization for acute encephalopathy - resolved Back to baseline Has neurocog impairment  Recent sepsis with unclear source- resolved- He took a few days of Iv cefepime in hospital and was sent home on po augmentin for 7 days which he completed Doing well  DM- not well controlled- wife says he has a follow up appt On glipizide  Discussed the management with wife and brother ( who accompanied him)  and patient ? ?follow PRN

## 2021-02-23 NOTE — Patient Instructions (Addendum)
You are here for follow up of a recent hospitalization for sepsis of unclear nature- you have been treated empirically with antibiotics and doing well- The blood culture taken on 10/19 ( 1 of 4 bottle) was a skin contamination and no further treatment/intervention is needed.

## 2021-02-24 DIAGNOSIS — E1151 Type 2 diabetes mellitus with diabetic peripheral angiopathy without gangrene: Secondary | ICD-10-CM | POA: Diagnosis not present

## 2021-02-24 DIAGNOSIS — E871 Hypo-osmolality and hyponatremia: Secondary | ICD-10-CM | POA: Diagnosis not present

## 2021-02-24 DIAGNOSIS — I10 Essential (primary) hypertension: Secondary | ICD-10-CM | POA: Diagnosis not present

## 2021-02-24 DIAGNOSIS — E785 Hyperlipidemia, unspecified: Secondary | ICD-10-CM | POA: Diagnosis not present

## 2021-02-24 DIAGNOSIS — E876 Hypokalemia: Secondary | ICD-10-CM | POA: Diagnosis not present

## 2021-02-24 DIAGNOSIS — Z9181 History of falling: Secondary | ICD-10-CM | POA: Diagnosis not present

## 2021-02-24 DIAGNOSIS — G9340 Encephalopathy, unspecified: Secondary | ICD-10-CM | POA: Diagnosis not present

## 2021-02-24 DIAGNOSIS — A419 Sepsis, unspecified organism: Secondary | ICD-10-CM | POA: Diagnosis not present

## 2021-02-24 DIAGNOSIS — D649 Anemia, unspecified: Secondary | ICD-10-CM | POA: Diagnosis not present

## 2021-02-24 DIAGNOSIS — Z89511 Acquired absence of right leg below knee: Secondary | ICD-10-CM | POA: Diagnosis not present

## 2021-02-25 DIAGNOSIS — R2681 Unsteadiness on feet: Secondary | ICD-10-CM | POA: Diagnosis not present

## 2021-02-25 DIAGNOSIS — M6281 Muscle weakness (generalized): Secondary | ICD-10-CM | POA: Diagnosis not present

## 2021-02-25 DIAGNOSIS — R531 Weakness: Secondary | ICD-10-CM | POA: Diagnosis not present

## 2021-03-04 DIAGNOSIS — E1151 Type 2 diabetes mellitus with diabetic peripheral angiopathy without gangrene: Secondary | ICD-10-CM | POA: Diagnosis not present

## 2021-03-04 DIAGNOSIS — D649 Anemia, unspecified: Secondary | ICD-10-CM | POA: Diagnosis not present

## 2021-03-04 DIAGNOSIS — E876 Hypokalemia: Secondary | ICD-10-CM | POA: Diagnosis not present

## 2021-03-04 DIAGNOSIS — Z9181 History of falling: Secondary | ICD-10-CM | POA: Diagnosis not present

## 2021-03-04 DIAGNOSIS — G9340 Encephalopathy, unspecified: Secondary | ICD-10-CM | POA: Diagnosis not present

## 2021-03-04 DIAGNOSIS — E785 Hyperlipidemia, unspecified: Secondary | ICD-10-CM | POA: Diagnosis not present

## 2021-03-04 DIAGNOSIS — Z89511 Acquired absence of right leg below knee: Secondary | ICD-10-CM | POA: Diagnosis not present

## 2021-03-04 DIAGNOSIS — A419 Sepsis, unspecified organism: Secondary | ICD-10-CM | POA: Diagnosis not present

## 2021-03-04 DIAGNOSIS — E871 Hypo-osmolality and hyponatremia: Secondary | ICD-10-CM | POA: Diagnosis not present

## 2021-03-04 DIAGNOSIS — I10 Essential (primary) hypertension: Secondary | ICD-10-CM | POA: Diagnosis not present

## 2021-03-12 DIAGNOSIS — D649 Anemia, unspecified: Secondary | ICD-10-CM | POA: Diagnosis not present

## 2021-03-12 DIAGNOSIS — Z89511 Acquired absence of right leg below knee: Secondary | ICD-10-CM | POA: Diagnosis not present

## 2021-03-12 DIAGNOSIS — Z9181 History of falling: Secondary | ICD-10-CM | POA: Diagnosis not present

## 2021-03-12 DIAGNOSIS — E871 Hypo-osmolality and hyponatremia: Secondary | ICD-10-CM | POA: Diagnosis not present

## 2021-03-12 DIAGNOSIS — E876 Hypokalemia: Secondary | ICD-10-CM | POA: Diagnosis not present

## 2021-03-12 DIAGNOSIS — A419 Sepsis, unspecified organism: Secondary | ICD-10-CM | POA: Diagnosis not present

## 2021-03-12 DIAGNOSIS — I10 Essential (primary) hypertension: Secondary | ICD-10-CM | POA: Diagnosis not present

## 2021-03-12 DIAGNOSIS — E785 Hyperlipidemia, unspecified: Secondary | ICD-10-CM | POA: Diagnosis not present

## 2021-03-12 DIAGNOSIS — E1151 Type 2 diabetes mellitus with diabetic peripheral angiopathy without gangrene: Secondary | ICD-10-CM | POA: Diagnosis not present

## 2021-03-12 DIAGNOSIS — G9340 Encephalopathy, unspecified: Secondary | ICD-10-CM | POA: Diagnosis not present

## 2021-03-14 ENCOUNTER — Other Ambulatory Visit: Payer: Self-pay | Admitting: Family Medicine

## 2021-03-14 DIAGNOSIS — E0849 Diabetes mellitus due to underlying condition with other diabetic neurological complication: Secondary | ICD-10-CM

## 2021-03-14 NOTE — Telephone Encounter (Signed)
Requested Prescriptions  Pending Prescriptions Disp Refills  . gabapentin (NEURONTIN) 100 MG capsule [Pharmacy Med Name: Gabapentin 100 MG Oral Capsule] 90 capsule 2    Sig: TAKE 1 CAPSULE BY MOUTH 3 TIMES  DAILY     Neurology: Anticonvulsants - gabapentin Passed - 03/14/2021  4:17 AM      Passed - Valid encounter within last 12 months    Recent Outpatient Visits          3 weeks ago Hospital discharge follow-up   [facility] Duanne Limerick, MD   2 months ago Need for immunization against influenza   [facility] Duanne Limerick, MD   6 months ago Type 2 diabetes mellitus with other circulatory complication, without long-term current use of insulin (HCC)   [facility] Duanne Limerick, MD   9 months ago Type 2 diabetes mellitus with other circulatory complication, without long-term current use of insulin (HCC)   [facility] Duanne Limerick, MD   11 months ago Type 2 diabetes mellitus with other circulatory complication, without long-term current use of insulin (HCC)   [facility] Duanne Limerick, MD      Future Appointments            In 2 months Duanne Limerick, MD [facility], [facility]

## 2021-03-17 DIAGNOSIS — D649 Anemia, unspecified: Secondary | ICD-10-CM | POA: Diagnosis not present

## 2021-03-17 DIAGNOSIS — G9340 Encephalopathy, unspecified: Secondary | ICD-10-CM | POA: Diagnosis not present

## 2021-03-17 DIAGNOSIS — I10 Essential (primary) hypertension: Secondary | ICD-10-CM | POA: Diagnosis not present

## 2021-03-17 DIAGNOSIS — Z89511 Acquired absence of right leg below knee: Secondary | ICD-10-CM | POA: Diagnosis not present

## 2021-03-17 DIAGNOSIS — E871 Hypo-osmolality and hyponatremia: Secondary | ICD-10-CM | POA: Diagnosis not present

## 2021-03-17 DIAGNOSIS — E785 Hyperlipidemia, unspecified: Secondary | ICD-10-CM | POA: Diagnosis not present

## 2021-03-17 DIAGNOSIS — E876 Hypokalemia: Secondary | ICD-10-CM | POA: Diagnosis not present

## 2021-03-17 DIAGNOSIS — A419 Sepsis, unspecified organism: Secondary | ICD-10-CM | POA: Diagnosis not present

## 2021-03-17 DIAGNOSIS — E1151 Type 2 diabetes mellitus with diabetic peripheral angiopathy without gangrene: Secondary | ICD-10-CM | POA: Diagnosis not present

## 2021-03-17 DIAGNOSIS — Z9181 History of falling: Secondary | ICD-10-CM | POA: Diagnosis not present

## 2021-03-22 ENCOUNTER — Telehealth: Payer: Self-pay

## 2021-03-22 NOTE — Telephone Encounter (Signed)
Copied from CRM [id number]. Topic: General - Other >> Mar 22, 2021 11:23 AM Traci Sermon wrote: Reason for CRM: Marylene Land from [facility] called in stating they were needing some orders signed and sent over that they have faxed to the office. (Just from looking through the Media tab) the first one was put in on 10/28- PLAN OF CARE, that one needed to be signed and faxed back and the second was [facility]/04[facility], that one was signed, but they did not receive. Marylene Land gave a fax number of [phone], please advise

## 2021-03-22 NOTE — Telephone Encounter (Signed)
Signed page was faxed again. Unsigned copy printed for Dr. Yetta Barre to sign.  KP

## 2021-03-25 DIAGNOSIS — I10 Essential (primary) hypertension: Secondary | ICD-10-CM | POA: Diagnosis not present

## 2021-03-25 DIAGNOSIS — E1151 Type 2 diabetes mellitus with diabetic peripheral angiopathy without gangrene: Secondary | ICD-10-CM | POA: Diagnosis not present

## 2021-03-25 DIAGNOSIS — E871 Hypo-osmolality and hyponatremia: Secondary | ICD-10-CM | POA: Diagnosis not present

## 2021-03-25 DIAGNOSIS — A419 Sepsis, unspecified organism: Secondary | ICD-10-CM | POA: Diagnosis not present

## 2021-03-25 DIAGNOSIS — E785 Hyperlipidemia, unspecified: Secondary | ICD-10-CM | POA: Diagnosis not present

## 2021-03-25 DIAGNOSIS — Z9181 History of falling: Secondary | ICD-10-CM | POA: Diagnosis not present

## 2021-03-25 DIAGNOSIS — E876 Hypokalemia: Secondary | ICD-10-CM | POA: Diagnosis not present

## 2021-03-25 DIAGNOSIS — Z89511 Acquired absence of right leg below knee: Secondary | ICD-10-CM | POA: Diagnosis not present

## 2021-03-25 DIAGNOSIS — G9340 Encephalopathy, unspecified: Secondary | ICD-10-CM | POA: Diagnosis not present

## 2021-03-25 DIAGNOSIS — D649 Anemia, unspecified: Secondary | ICD-10-CM | POA: Diagnosis not present

## 2021-04-06 NOTE — Telephone Encounter (Signed)
Jose Schroeder called back in to get an update stating they still have not received it and wanted to see if it can be faxed to a different number at [phone], please advise.

## 2021-04-15 DIAGNOSIS — E1151 Type 2 diabetes mellitus with diabetic peripheral angiopathy without gangrene: Secondary | ICD-10-CM | POA: Diagnosis not present

## 2021-04-15 DIAGNOSIS — Z89511 Acquired absence of right leg below knee: Secondary | ICD-10-CM | POA: Diagnosis not present

## 2021-04-15 DIAGNOSIS — M79672 Pain in left foot: Secondary | ICD-10-CM | POA: Diagnosis not present

## 2021-04-15 DIAGNOSIS — L97523 Non-pressure chronic ulcer of other part of left foot with necrosis of muscle: Secondary | ICD-10-CM | POA: Diagnosis not present

## 2021-04-15 DIAGNOSIS — I739 Peripheral vascular disease, unspecified: Secondary | ICD-10-CM | POA: Diagnosis not present

## 2021-04-22 ENCOUNTER — Encounter (INDEPENDENT_AMBULATORY_CARE_PROVIDER_SITE_OTHER): Payer: Medicare Other

## 2021-04-22 ENCOUNTER — Ambulatory Visit (INDEPENDENT_AMBULATORY_CARE_PROVIDER_SITE_OTHER): Payer: Medicare Other | Admitting: Vascular Surgery

## 2021-04-29 DIAGNOSIS — E1151 Type 2 diabetes mellitus with diabetic peripheral angiopathy without gangrene: Secondary | ICD-10-CM | POA: Diagnosis not present

## 2021-04-29 DIAGNOSIS — I96 Gangrene, not elsewhere classified: Secondary | ICD-10-CM | POA: Diagnosis not present

## 2021-04-29 DIAGNOSIS — Z89511 Acquired absence of right leg below knee: Secondary | ICD-10-CM | POA: Diagnosis not present

## 2021-05-11 ENCOUNTER — Other Ambulatory Visit (INDEPENDENT_AMBULATORY_CARE_PROVIDER_SITE_OTHER): Payer: Self-pay | Admitting: Nurse Practitioner

## 2021-05-11 ENCOUNTER — Other Ambulatory Visit: Payer: Self-pay | Admitting: Family Medicine

## 2021-05-11 DIAGNOSIS — I70212 Atherosclerosis of native arteries of extremities with intermittent claudication, left leg: Secondary | ICD-10-CM

## 2021-05-11 DIAGNOSIS — E0849 Diabetes mellitus due to underlying condition with other diabetic neurological complication: Secondary | ICD-10-CM

## 2021-05-11 NOTE — Telephone Encounter (Signed)
Requested Prescriptions  Pending Prescriptions Disp Refills   gabapentin (NEURONTIN) 100 MG capsule [Pharmacy Med Name: GABAPENTIN 100 MG CAPSULE] 90 capsule 11    Sig: Take one capsule by mouth three times daily     Neurology: Anticonvulsants - gabapentin Passed - 05/11/2021 12:24 PM      Passed - Valid encounter within last 12 months    Recent Outpatient Visits          2 months ago Hospital discharge follow-up   [facility] Duanne Limerick, MD   3 months ago Need for immunization against influenza   [facility] Duanne Limerick, MD   8 months ago Type 2 diabetes mellitus with other circulatory complication, without long-term current use of insulin (HCC)   [facility] Duanne Limerick, MD   11 months ago Type 2 diabetes mellitus with other circulatory complication, without long-term current use of insulin (HCC)   [facility] Duanne Limerick, MD   1 year ago Type 2 diabetes mellitus with other circulatory complication, without long-term current use of insulin (HCC)   [facility] Duanne Limerick, MD      Future Appointments            In 3 days Duanne Limerick, MD [facility], [facility]

## 2021-05-12 ENCOUNTER — Ambulatory Visit (INDEPENDENT_AMBULATORY_CARE_PROVIDER_SITE_OTHER): Payer: Medicare Other

## 2021-05-12 ENCOUNTER — Encounter (INDEPENDENT_AMBULATORY_CARE_PROVIDER_SITE_OTHER): Payer: Self-pay | Admitting: Nurse Practitioner

## 2021-05-12 ENCOUNTER — Ambulatory Visit (INDEPENDENT_AMBULATORY_CARE_PROVIDER_SITE_OTHER): Payer: Medicare Other | Admitting: Nurse Practitioner

## 2021-05-12 ENCOUNTER — Other Ambulatory Visit: Payer: Self-pay

## 2021-05-12 VITALS — BP 128/65 | HR 88 | Resp 16 | Ht 74.0 in | Wt 136.0 lb

## 2021-05-12 DIAGNOSIS — Z794 Long term (current) use of insulin: Secondary | ICD-10-CM | POA: Diagnosis not present

## 2021-05-12 DIAGNOSIS — E1152 Type 2 diabetes mellitus with diabetic peripheral angiopathy with gangrene: Secondary | ICD-10-CM

## 2021-05-12 DIAGNOSIS — E7801 Familial hypercholesterolemia: Secondary | ICD-10-CM

## 2021-05-12 DIAGNOSIS — I70212 Atherosclerosis of native arteries of extremities with intermittent claudication, left leg: Secondary | ICD-10-CM

## 2021-05-12 DIAGNOSIS — I10 Essential (primary) hypertension: Secondary | ICD-10-CM | POA: Diagnosis not present

## 2021-05-12 DIAGNOSIS — I7025 Atherosclerosis of native arteries of other extremities with ulceration: Secondary | ICD-10-CM | POA: Diagnosis not present

## 2021-05-12 NOTE — Progress Notes (Signed)
Subjective:    Patient ID: Jose Schroeder, male    DOB: 06/27/1932, 88 y.o.   MRN: [id number] Chief Complaint  Patient presents with   Follow-up    ultrasound    Jose Schroeder is an 88 year old  seen for evaluation of painful lower extremities and diminished pulses associated with ulceration of the foot.  The patient was recently seen by Dr. Ether Griffins and has dry gangrene changes in his left third toe.  The patient has a history of PAD with a previous right below-knee amputation.  The patient has dementia and the daughter provides much of the history.  The patient notes the ulcer has been present for multiple weeks and has not been improving.  It is very painful and has had some drainage.  No specific history of trauma noted by the patient.  The patient denies fever or chills.  The patient does have diabetes which has been difficult to control.  Patient notes prior to the ulcer developing the extremities were painful particularly with ambulation or activity and the discomfort is very consistent day today. Typically, the pain occurs at less than one block, progress is as activity continues to the point that the patient must stop walking. Resting including standing still for several minutes allowed resumption of the activity and the ability to walk a similar distance before stopping again. Uneven terrain and inclined shorten the distance. The pain has been progressive over the past several years.   The daughter notes that he has been experiencing pain consistent with rest pain. Today noninvasive studies show an ABI of 0.63 on the left.  He has a TBI of 0.  There are monophasic tibial artery waveforms with flat toe waveforms on the left.  No history of back problems or DJD of the lumbar sacral spine.   The patient denies amaurosis fugax or recent TIA symptoms. There are no recent neurological changes noted. The patient denies history of DVT, PE or superficial thrombophlebitis. The patient  denies recent episodes of angina or shortness of breath.     Review of Systems  Cardiovascular:        Rest pain  Musculoskeletal:  Positive for gait problem.  Skin:  Positive for color change and wound.  All other systems reviewed and are negative.     Objective:   Physical Exam Vitals reviewed.  HENT:     Head: Normocephalic.  Cardiovascular:     Rate and Rhythm: Normal rate.     Pulses:          Dorsalis pedis pulses are 0 on the left side.       Posterior tibial pulses are 0 on the left side.  Pulmonary:     Effort: Pulmonary effort is normal.  Musculoskeletal:       Feet:     Right Lower Extremity: Right leg is amputated below knee.  Skin:    General: Skin is dry.  Neurological:     Mental Status: He is alert. Mental status is at baseline.     Motor: Weakness present.     Gait: Gait abnormal.  Psychiatric:        Mood and Affect: Mood normal.        Behavior: Behavior normal.        Thought Content: Thought content normal.        Judgment: Judgment normal.    BP 128/65 (BP Location: Right Arm)    Pulse 88    Resp 16  Ht 6\' 2"  (1.88 m)    Wt 136 lb (61.7 kg)    BMI 17.46 kg/m   Past Medical History:  Diagnosis Date   Diabetes mellitus without complication (HCC)    Hypertension    Peripheral vascular disease ([place])     Social History   Socioeconomic History   Marital status: Married    Spouse name: Not on file   Number of children: 4   Years of education: Not on file   Highest education level: 8th grade  Occupational History   Occupation: retired  Tobacco Use   Smoking status: Never   Smokeless tobacco: Never   Tobacco comments:    Smoking cessation materials not required  Vaping Use   Vaping Use: Never used  Substance and Sexual Activity   Alcohol use: No    Alcohol/week: 0.0 standard drinks   Drug use: No   Sexual activity: Not Currently  Other Topics Concern   Not on file  Social History Narrative   Not on file   Social Determinants  of Health   Financial Resource Strain: Low Risk    Difficulty of Paying Living Expenses: Not very hard  Food Insecurity: No Food Insecurity   Worried About Charity fundraiser in the Last Year: Never true   Ran Out of Food in the Last Year: Never true  Transportation Needs: No Transportation Needs   Lack of Transportation (Medical): No   Lack of Transportation (Non-Medical): No  Physical Activity: Inactive   Days of Exercise per Week: 0 days   Minutes of Exercise per Session: 0 min  Stress: No Stress Concern Present   Feeling of Stress : Not at all  Social Connections: Moderately Isolated   Frequency of Communication with Friends and Family: More than three times a week   Frequency of Social Gatherings with Friends and Family: Not on file   Attends Religious Services: Never   Active Member of Clubs or Organizations: No   Attends Archivist Meetings: Never   Marital Status: Married  Human resources officer Violence: Not At Risk   Fear of Current or Ex-Partner: No   Emotionally Abused: No   Physically Abused: No   Sexually Abused: No    Past Surgical History:  Procedure Laterality Date   AMPUTATION TOE Right 01/15/2016   Procedure: AMPUTATION TOE;  Surgeon: Samara Deist, DPM;  Location: [facility] OR[facility];  Service: Podiatry;  Laterality: Right;   PERIPHERAL VASCULAR CATHETERIZATION Right 01/07/2016   Procedure: Abdominal Aortogram w/Lower Extremity;  Surgeon: Katha Cabal, MD;  Location: [facility];  Service: Cardiovascular;  Laterality: Right;   TOE SURGERY     amputated 2 toes on L) foot    Family History  Problem Relation Age of Onset   Diabetes Mother    Hypertension Mother    Diabetes Father    Hypertension Father    Diabetes Daughter     No Known Allergies  CBC Latest Ref Rng & Units 02/19/2021 02/13/2021 02/12/2021  WBC 3.4 - 10.8 x10E3/uL 6.3 6.9 9.2  Hemoglobin 13.0 - 17.7 g/dL 10.3(L) 9.4(L) 9.8(L)  Hematocrit 37.5 - 51.0 % 31.0(L) 28.1(L) 29.1(L)   Platelets 150 - 450 x10E3/uL 322 191 190      CMP     Component Value Date/Time   NA 140 02/19/2021 1538   NA 141 11/12/2012 0717   K 4.5 02/19/2021 1538   K 4.0 11/12/2012 0717   CL 98 02/19/2021 1538   CL 106 11/12/2012  0717   CO2 27 02/19/2021 1538   CO2 31 11/12/2012 0717   GLUCOSE 289 (H) 02/19/2021 1538   GLUCOSE 215 (H) 02/13/2021 0431   GLUCOSE 149 (H) 11/12/2012 0717   BUN 9 02/19/2021 1538   BUN 10 11/12/2012 0717   CREATININE 0.88 02/19/2021 1538   CREATININE 0.99 11/12/2012 0717   CALCIUM 10.0 02/19/2021 1538   CALCIUM 9.4 11/12/2012 0717   PROT 6.3 (L) 02/13/2021 0431   PROT 7.5 05/17/2019 1412   ALBUMIN 4.8 (H) 02/19/2021 1538   AST 36 02/13/2021 0431   ALT 24 02/13/2021 0431   ALKPHOS 41 02/13/2021 0431   BILITOT 1.2 02/13/2021 0431   BILITOT 0.3 05/17/2019 1412   GFRNONAA >60 02/13/2021 0431   GFRNONAA >60 11/12/2012 0717   GFRAA 90 10/01/2019 1550   GFRAA >60 11/12/2012 0717     No results found.     Assessment & Plan:   1. Atherosclerosis of native arteries of the extremities with ulceration ([place])  Recommend:  The patient has evidence of severe atherosclerotic changes of the the left lower extremity associated with ulceration and tissue loss of the foot.  This represents a limb threatening ischemia and places the patient at the risk for limb loss.  Patient should undergo angiography of the lower extremities with the hope for intervention for limb salvage.  The risks and benefits as well as the alternative therapies was discussed in detail with the patient.  All questions were answered.  Patient agrees to proceed with angiography.  The patient will follow up with me in the office after the procedure.    2. Type 2 diabetes mellitus with diabetic peripheral angiopathy and gangrene, with long-term current use of insulin (HCC) Continue hypoglycemic medications as already ordered, these medications have been reviewed and there are no changes at  this time.  Hgb A1C to be monitored as already arranged by primary service   3. Essential hypertension Continue antihypertensive medications as already ordered, these medications have been reviewed and there are no changes at this time.   4. Familial hypercholesterolemia Continue statin as ordered and reviewed, no changes at this time    Current Outpatient Medications on File Prior to Visit  Medication Sig Dispense Refill   acetaminophen (TYLENOL) 325 MG tablet Take 2 tablets (650 mg total) by mouth every 6 (six) hours as needed for mild pain (or Fever >/= 101). 20 tablet 0   amLODipine (NORVASC) 10 MG tablet Take 1 tablet (10 mg total) by mouth daily. 90 tablet 1   aspirin EC 81 MG tablet Take 1 tablet (81 mg total) by mouth daily. 30 tablet 2   atorvastatin (LIPITOR) 20 MG tablet Take 1 tablet (20 mg total) by mouth daily. 90 tablet 1   ferrous sulfate 325 (65 FE) MG tablet Take 1 tablet (325 mg total) by mouth daily with breakfast. 90 tablet 1   gabapentin (NEURONTIN) 100 MG capsule Take one capsule by mouth three times daily 90 capsule 11   glipiZIDE (GLUCOTROL XL) 10 MG 24 hr tablet Take 1 tablet (10 mg total) by mouth daily with breakfast. 90 tablet 1   hydrochlorothiazide (HYDRODIURIL) 25 MG tablet Take 1 tablet (25 mg total) by mouth daily. 90 tablet 1   lisinopril (ZESTRIL) 10 MG tablet Take 1 tablet (10 mg total) by mouth daily. 90 tablet 1   metFORMIN (GLUCOPHAGE) 500 MG tablet Take 1 tablet (500 mg total) by mouth 2 (two) times daily. 180 tablet 1   metoprolol  tartrate (LOPRESSOR) 50 MG tablet Take 1 tablet (50 mg total) by mouth 2 (two) times daily. 28 tablet 0   ONETOUCH ULTRA test strip USE AS DIRECTED BY  PHYSICIAN 200 strip 5   sitaGLIPtin (JANUVIA) 100 MG tablet Take 1 tablet (100 mg total) by mouth daily. 90 tablet 1   No current facility-administered medications on file prior to visit.    There are no Patient Instructions on file for this visit. No follow-ups on  file.   Kris Hartmann, NP

## 2021-05-13 ENCOUNTER — Other Ambulatory Visit (INDEPENDENT_AMBULATORY_CARE_PROVIDER_SITE_OTHER): Payer: Self-pay | Admitting: Nurse Practitioner

## 2021-05-13 ENCOUNTER — Other Ambulatory Visit: Payer: Self-pay

## 2021-05-13 ENCOUNTER — Emergency Department
Admission: EM | Admit: 2021-05-13 | Discharge: 2021-05-13 | Disposition: A | Discharge status: Home / Self Care | Payer: Medicare Other | Attending: Emergency Medicine | Admitting: Emergency Medicine

## 2021-05-13 ENCOUNTER — Emergency Department: Payer: Medicare Other

## 2021-05-13 DIAGNOSIS — I96 Gangrene, not elsewhere classified: Secondary | ICD-10-CM | POA: Diagnosis not present

## 2021-05-13 DIAGNOSIS — R739 Hyperglycemia, unspecified: Secondary | ICD-10-CM | POA: Diagnosis not present

## 2021-05-13 DIAGNOSIS — Z743 Need for continuous supervision: Secondary | ICD-10-CM | POA: Diagnosis not present

## 2021-05-13 DIAGNOSIS — E860 Dehydration: Secondary | ICD-10-CM | POA: Insufficient documentation

## 2021-05-13 DIAGNOSIS — I998 Other disorder of circulatory system: Secondary | ICD-10-CM | POA: Insufficient documentation

## 2021-05-13 DIAGNOSIS — E1151 Type 2 diabetes mellitus with diabetic peripheral angiopathy without gangrene: Secondary | ICD-10-CM | POA: Diagnosis not present

## 2021-05-13 DIAGNOSIS — I10 Essential (primary) hypertension: Secondary | ICD-10-CM | POA: Diagnosis not present

## 2021-05-13 DIAGNOSIS — R5381 Other malaise: Secondary | ICD-10-CM | POA: Diagnosis not present

## 2021-05-13 DIAGNOSIS — M62272 Nontraumatic ischemic infarction of muscle, left ankle and foot: Secondary | ICD-10-CM | POA: Diagnosis not present

## 2021-05-13 DIAGNOSIS — A419 Sepsis, unspecified organism: Secondary | ICD-10-CM | POA: Diagnosis not present

## 2021-05-13 DIAGNOSIS — R4182 Altered mental status, unspecified: Secondary | ICD-10-CM | POA: Diagnosis not present

## 2021-05-13 DIAGNOSIS — Z89511 Acquired absence of right leg below knee: Secondary | ICD-10-CM | POA: Diagnosis not present

## 2021-05-13 DIAGNOSIS — R6889 Other general symptoms and signs: Secondary | ICD-10-CM | POA: Diagnosis not present

## 2021-05-13 LAB — COMPREHENSIVE METABOLIC PANEL
ALT: 15 U/L (ref 0–44)
AST: 20 U/L (ref 15–41)
Albumin: 3.4 g/dL — ABNORMAL LOW (ref 3.5–5.0)
Alkaline Phosphatase: 60 U/L (ref 38–126)
Anion gap: 8 (ref 5–15)
BUN: 25 mg/dL — ABNORMAL HIGH (ref 8–23)
CO2: 25 mmol/L (ref 22–32)
Calcium: 8.9 mg/dL (ref 8.9–10.3)
Chloride: 101 mmol/L (ref 98–111)
Creatinine, Ser: 0.98 mg/dL (ref 0.61–1.24)
GFR, Estimated: >60 mL/min (ref >60)
Glucose, Bld: 318 mg/dL — ABNORMAL HIGH (ref 70–99)
Potassium: 3.9 mmol/L (ref 3.5–5.1)
Sodium: 134 mmol/L — ABNORMAL LOW (ref 135–145)
Total Bilirubin: 1.1 mg/dL (ref 0.3–1.2)
Total Protein: 7.6 g/dL (ref 6.5–8.1)

## 2021-05-13 LAB — CBC WITH DIFFERENTIAL/PLATELET
Abs Immature Granulocytes: 0.11 10*3/uL — ABNORMAL HIGH (ref 0.00–0.07)
Basophils Absolute: 0 10*3/uL (ref 0.0–0.1)
Basophils Relative: 0 %
Eosinophils Absolute: 0 10*3/uL (ref 0.0–0.5)
Eosinophils Relative: 0 %
HCT: 24.9 % — ABNORMAL LOW (ref 39.0–52.0)
Hemoglobin: 8.2 g/dL — ABNORMAL LOW (ref 13.0–17.0)
Immature Granulocytes: 1 %
Lymphocytes Relative: 9 %
Lymphs Abs: 1.6 10*3/uL (ref 0.7–4.0)
MCH: 28.8 pg (ref 26.0–34.0)
MCHC: 32.9 g/dL (ref 30.0–36.0)
MCV: 87.4 fL (ref 80.0–100.0)
Monocytes Absolute: 1.2 10*3/uL — ABNORMAL HIGH (ref 0.1–1.0)
Monocytes Relative: 7 %
Neutro Abs: 14.7 10*3/uL — ABNORMAL HIGH (ref 1.7–7.7)
Neutrophils Relative %: 83 %
Platelets: 339 10*3/uL (ref 150–400)
RBC: 2.85 MIL/uL — ABNORMAL LOW (ref 4.22–5.81)
RDW: 12.4 % (ref 11.5–15.5)
WBC: 17.7 10*3/uL — ABNORMAL HIGH (ref 4.0–10.5)
nRBC: 0 % (ref 0.0–0.2)

## 2021-05-13 LAB — PROTIME-INR
INR: 1.2 (ref 0.8–1.2)
Prothrombin Time: 15.2 seconds (ref 11.4–15.2)

## 2021-05-13 LAB — LACTIC ACID, PLASMA: Lactic Acid, Venous: 1.5 mmol/L (ref 0.5–1.9)

## 2021-05-13 MED ORDER — DOXYCYCLINE HYCLATE 50 MG PO CAPS: 100.0000 mg | ORAL_CAPSULE | Freq: Two times a day (BID) | ORAL | 0 refills | Status: AC

## 2021-05-13 MED ORDER — DOXYCYCLINE HYCLATE 50 MG PO CAPS: 100.0000 mg | ORAL_CAPSULE | Freq: Two times a day (BID) | ORAL | 0 refills | Status: DC

## 2021-05-13 NOTE — ED Notes (Signed)
Pt found sitting at the end of his bed.  Assisted pt w/ urinal and bedside cleaning completed.

## 2021-05-13 NOTE — ED Notes (Signed)
Discharge paperwork provided to Pt's brother.

## 2021-05-13 NOTE — ED Notes (Signed)
This Probation officer entered room and found Pt had removed most of monitoring equipment.  Pt reoriented and monitoring equipment reapplied.

## 2021-05-13 NOTE — ED Notes (Signed)
Pt's wife, Britta Mccreedy, provided w/ an update. Will call transport to take Pt home.

## 2021-05-13 NOTE — ED Notes (Signed)
[facility]  CALLED  FOR  TRANSPORT 

## 2021-05-13 NOTE — ED Provider Notes (Signed)
[facility] Provider Note    Event Date/Time   First MD Initiated Contact with Patient 05/13/21 1306     (approximate)   History   Altered Mental Status   HPI  Jose Schroeder is a 88 y.o. male with a past medical history of BKA on the right and peripheral vascular disease on the left causing chronic ischemia to the second toe on the left foot who presents for alleged altered mental status.  Per family patient had been more confused than normal and were concerned that he may have an infection in this toe.  Patient denies any worsening pain, redness, subjective fever/chills     Physical Exam   Triage Vital Signs: ED Triage Vitals  Enc Vitals Group     BP 05/13/21 1255 140/63     Pulse Rate 05/13/21 1255 95     Resp 05/13/21 1255 11     Temp 05/13/21 1255 98.7 F (37.1 C)     Temp Source 05/13/21 1255 Oral     SpO2 05/13/21 1251 98 %     Weight 05/13/21 1257 150 lb (68 kg)     Height 05/13/21 1257 6' (1.829 m)     Head Circumference --      Peak Flow --      Pain Score --      Pain Loc --      Pain Edu? --      Excl. in GC? --     Most recent vital signs: Vitals:   05/13/21 1430 05/13/21 1500  BP: (!) 115/48 121/60  Pulse: 85 82  Resp: 15 14  Temp:    SpO2: 100% 97%    General: Awake, no distress.  ANO x4 CV:  Good peripheral perfusion.  Resp:  Normal effort.  Abd:  No distention.  Other:  Chronic dry gangrenous left second toe without surrounding erythema, induration, or significant tenderness to palpation   ED Results / Procedures / Treatments   Labs (all labs ordered are listed, but only abnormal results are displayed) Labs Reviewed  COMPREHENSIVE METABOLIC PANEL - Abnormal; Notable for the following components:      Result Value   Sodium 134 (*)    Glucose, Bld 318 (*)    BUN 25 (*)    Albumin 3.4 (*)    All other components within normal limits  CBC WITH DIFFERENTIAL/PLATELET - Abnormal; Notable for the following  components:   WBC 17.7 (*)    RBC 2.85 (*)    Hemoglobin 8.2 (*)    HCT 24.9 (*)    Neutro Abs 14.7 (*)    Monocytes Absolute 1.2 (*)    Abs Immature Granulocytes 0.11 (*)    All other components within normal limits  CULTURE, BLOOD (ROUTINE X 2)  CULTURE, BLOOD (ROUTINE X 2)  LACTIC ACID, PLASMA  PROTIME-INR  LACTIC ACID, PLASMA  URINALYSIS, ROUTINE W REFLEX MICROSCOPIC   RADIOLOGY  ED MD interpretation: 2 view chest x-ray shows no evidence of acute abnormalities including no pneumonia, pneumothorax, or widened mediastinum.  Agree with radiology assessment  Official radiology report(s): DG Chest 2 View  Result Date: 05/13/2021 CLINICAL DATA:  Sepsis EXAM: CHEST - 2 VIEW COMPARISON:  None. FINDINGS: The cardiac silhouette, mediastinal and hilar contours are within normal limits. The lungs are clear. No pulmonary edema, pleural effusions or infiltrates. No pulmonary lesions. No pneumothorax. The bony thorax is intact. IMPRESSION: No acute cardiopulmonary findings. Electronically Signed   By: Rudie Meyer  M.D.   On: 05/13/2021 13:52      PROCEDURES:  Critical Care performed: No  .1-3 Lead EKG Interpretation Performed by: Merwyn Katos, MD Authorized by: Merwyn Katos, MD     Interpretation: normal     ECG rate:  81   ECG rate assessment: normal     Rhythm: sinus rhythm     Ectopy: none     Conduction: normal     MEDICATIONS ORDERED IN ED: Medications - No data to display   IMPRESSION / MDM / ASSESSMENT AND PLAN / ED COURSE  I reviewed the triage vital signs and the nursing notes.                               The patient is on the cardiac monitor to evaluate for evidence of arrhythmia and/or significant heart rate changes.  The patient suffered an episode of altered mental status, but there is no overt concern for a dangerous emergent cause such as, but not limited to, CNS infection, severe Toxidrome, severe metabolic derangement, or stroke.  Given History,  Physical, and Workup the cause is unclear.  Patient does show signs of slight leukocytosis at 17 and therefore will cover empirically for possible infection in this toe with doxycycline  Disposition: Discharge. At the time of discharge, the patient is back to baseline mental status.       FINAL CLINICAL IMPRESSION(S) / ED DIAGNOSES   Final diagnoses:  Dehydration  Ischemia of toe     Rx / DC Orders   ED Discharge Orders          Ordered    doxycycline (VIBRAMYCIN) 50 MG capsule  2 times daily        05/13/21 1602             Note:  This document was prepared using Dragon voice recognition software and may include unintentional dictation errors.   Merwyn Katos, MD 05/13/21 1623

## 2021-05-13 NOTE — ED Triage Notes (Signed)
Per EMS, Pt, from home, presents for AMS.  Pt's family called out because the Pt was not acting "normal."  Hx of infection in L foot.  Family reported to EMS that the Pt is having vascular surgery soon in an attempt to not loose his toe.    Vitals en route: 100HR, 20 RR, 140/60, 98% RA  CBG 534

## 2021-05-14 ENCOUNTER — Other Ambulatory Visit: Payer: Self-pay

## 2021-05-14 ENCOUNTER — Encounter: Payer: Self-pay | Admitting: Emergency Medicine

## 2021-05-14 ENCOUNTER — Ambulatory Visit (INDEPENDENT_AMBULATORY_CARE_PROVIDER_SITE_OTHER): Payer: Medicare Other | Admitting: Family Medicine

## 2021-05-14 ENCOUNTER — Encounter: Payer: Self-pay | Admitting: Family Medicine

## 2021-05-14 ENCOUNTER — Emergency Department
Admission: EM | Admit: 2021-05-14 | Discharge: 2021-05-15 | Disposition: A | Discharge status: Home / Self Care | Payer: Medicare Other | Attending: Emergency Medicine | Admitting: Emergency Medicine

## 2021-05-14 ENCOUNTER — Ambulatory Visit: Payer: Medicare Other | Admitting: Family Medicine

## 2021-05-14 VITALS — BP 100/42 | HR 88 | Ht 74.0 in | Wt 150.0 lb

## 2021-05-14 DIAGNOSIS — R410 Disorientation, unspecified: Secondary | ICD-10-CM | POA: Diagnosis not present

## 2021-05-14 DIAGNOSIS — I739 Peripheral vascular disease, unspecified: Secondary | ICD-10-CM | POA: Diagnosis not present

## 2021-05-14 DIAGNOSIS — Z7984 Long term (current) use of oral hypoglycemic drugs: Secondary | ICD-10-CM | POA: Insufficient documentation

## 2021-05-14 DIAGNOSIS — I10 Essential (primary) hypertension: Secondary | ICD-10-CM | POA: Diagnosis not present

## 2021-05-14 DIAGNOSIS — Z20822 Contact with and (suspected) exposure to covid-19: Secondary | ICD-10-CM | POA: Insufficient documentation

## 2021-05-14 DIAGNOSIS — I96 Gangrene, not elsewhere classified: Secondary | ICD-10-CM | POA: Diagnosis not present

## 2021-05-14 DIAGNOSIS — E1165 Type 2 diabetes mellitus with hyperglycemia: Secondary | ICD-10-CM | POA: Insufficient documentation

## 2021-05-14 DIAGNOSIS — S98112A Complete traumatic amputation of left great toe, initial encounter: Secondary | ICD-10-CM | POA: Diagnosis not present

## 2021-05-14 DIAGNOSIS — E860 Dehydration: Secondary | ICD-10-CM | POA: Insufficient documentation

## 2021-05-14 DIAGNOSIS — E1159 Type 2 diabetes mellitus with other circulatory complications: Secondary | ICD-10-CM

## 2021-05-14 DIAGNOSIS — R739 Hyperglycemia, unspecified: Secondary | ICD-10-CM

## 2021-05-14 LAB — COMPREHENSIVE METABOLIC PANEL
ALT: 15 U/L (ref 0–44)
AST: 23 U/L (ref 15–41)
Albumin: 3.5 g/dL (ref 3.5–5.0)
Alkaline Phosphatase: 55 U/L (ref 38–126)
Anion gap: 12 (ref 5–15)
BUN: 28 mg/dL — ABNORMAL HIGH (ref 8–23)
CO2: 27 mmol/L (ref 22–32)
Calcium: 9.6 mg/dL (ref 8.9–10.3)
Chloride: 98 mmol/L (ref 98–111)
Creatinine, Ser: 1.24 mg/dL (ref 0.61–1.24)
GFR, Estimated: 57 mL/min — ABNORMAL LOW (ref >60)
Glucose, Bld: 353 mg/dL — ABNORMAL HIGH (ref 70–99)
Potassium: 4 mmol/L (ref 3.5–5.1)
Sodium: 137 mmol/L (ref 135–145)
Total Bilirubin: 0.9 mg/dL (ref 0.3–1.2)
Total Protein: 7.9 g/dL (ref 6.5–8.1)

## 2021-05-14 LAB — CBC
HCT: 24.8 % — ABNORMAL LOW (ref 39.0–52.0)
Hemoglobin: 8.2 g/dL — ABNORMAL LOW (ref 13.0–17.0)
MCH: 29.3 pg (ref 26.0–34.0)
MCHC: 33.1 g/dL (ref 30.0–36.0)
MCV: 88.6 fL (ref 80.0–100.0)
Platelets: 405 10*3/uL — ABNORMAL HIGH (ref 150–400)
RBC: 2.8 MIL/uL — ABNORMAL LOW (ref 4.22–5.81)
RDW: 12.3 % (ref 11.5–15.5)
WBC: 20.7 10*3/uL — ABNORMAL HIGH (ref 4.0–10.5)
nRBC: 0 % (ref 0.0–0.2)

## 2021-05-14 NOTE — Progress Notes (Signed)
Date:  05/14/2021   Name:  Jose Schroeder   DOB:  1933-02-05   MRN:  [id number]   Chief Complaint: Diabetes  Diabetes He presents for his follow-up diabetic visit. He has type 2 diabetes mellitus. His disease course has been worsening. There are no hypoglycemic associated symptoms. Associated symptoms include foot ulcerations, polydipsia, polyuria and weakness. Pertinent negatives for diabetes include no chest pain, no fatigue and no weight loss. Symptoms are worsening. Diabetic complications include PVD. Risk factors for coronary artery disease include diabetes mellitus. Current diabetic treatment includes oral agent (triple therapy). He is compliant with treatment all of the time. He is following a generally unhealthy diet. Meal planning includes avoidance of concentrated sweets and carbohydrate counting. (Uncertain what current glucose trends are at presnt)   Lab Results  Component Value Date   NA 134 (L) 05/13/2021   K 3.9 05/13/2021   CO2 25 05/13/2021   GLUCOSE 318 (H) 05/13/2021   BUN 25 (H) 05/13/2021   CREATININE 0.98 05/13/2021   CALCIUM 8.9 05/13/2021   EGFR 84 02/19/2021   GFRNONAA >60 05/13/2021   Lab Results  Component Value Date   CHOL 123 09/08/2020   HDL 48 09/08/2020   LDLCALC 59 09/08/2020   TRIG 82 09/08/2020   CHOLHDL 2.6 09/08/2020   No results found for: TSH Lab Results  Component Value Date   HGBA1C 8.7 (H) 01/12/2021   Lab Results  Component Value Date   WBC 17.7 (H) 05/13/2021   HGB 8.2 (L) 05/13/2021   HCT 24.9 (L) 05/13/2021   MCV 87.4 05/13/2021   PLT 339 05/13/2021   Lab Results  Component Value Date   ALT 15 05/13/2021   AST 20 05/13/2021   ALKPHOS 60 05/13/2021   BILITOT 1.1 05/13/2021   No results found for: 25OHVITD2, 25OHVITD3, VD25OH   Review of Systems  Constitutional:  Negative for fatigue, fever, unexpected weight change and weight loss.  Cardiovascular:  Negative for chest pain.  Endocrine: Positive for polydipsia  and polyuria.  Neurological:  Positive for weakness.   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   Ulcer of right foot (HCC) 01/31/2016   Amputated great toe (HCC) 01/11/2016   Toe infection 01/04/2016   Diabetes mellitus (HCC) 09/24/2015   Essential hypertension 09/24/2015    No Known Allergies  Past Surgical History:  Procedure Laterality Date   AMPUTATION TOE Right 01/15/2016   Procedure: AMPUTATION TOE;  Surgeon: Gwyneth Revels, DPM;  Location: [facility] ORS;  Service: Podiatry;  Laterality: Right;   PERIPHERAL VASCULAR CATHETERIZATION Right 01/07/2016   Procedure: Abdominal Aortogram w/Lower Extremity;  Surgeon: Renford Dills, MD;  Location: [facility] INVASIVE CV LAB;  Service: Cardiovascular;  Laterality: Right;   TOE SURGERY     amputated 2 toes on L) foot    Social History   Tobacco Use   Smoking status: Never   Smokeless tobacco: Never   Tobacco comments:    Smoking cessation materials not required  Vaping Use   Vaping Use: Never used  Substance Use Topics   Alcohol use: No    Alcohol/week: 0.0 standard drinks   Drug use: No     Medication list has been reviewed and updated.  Current Meds  Medication Sig   acetaminophen (TYLENOL) 325 MG tablet Take 2 tablets (650 mg total) by mouth every 6 (six) hours as needed for mild pain (or Fever >/= 101).   amLODipine (NORVASC) 10 MG tablet Take 1 tablet (10 mg total) by mouth daily.   aspirin EC 81 MG tablet Take 1 tablet (81 mg total) by mouth  daily.   atorvastatin (LIPITOR) 20 MG tablet Take 1 tablet (20 mg total) by mouth daily.   ferrous sulfate 325 (65 FE) MG tablet Take 1 tablet (325 mg total) by mouth daily with breakfast.   gabapentin (NEURONTIN) 100 MG capsule Take one capsule by mouth three times daily   glipiZIDE (GLUCOTROL XL) 10 MG 24 hr tablet Take 1 tablet (10 mg total) by mouth daily with breakfast.   hydrochlorothiazide (HYDRODIURIL) 25 MG tablet Take 1 tablet (25 mg total) by mouth daily.   lisinopril (ZESTRIL) 10 MG tablet Take 1 tablet (10 mg total) by mouth daily.   metFORMIN (GLUCOPHAGE) 500 MG tablet Take 1 tablet (500 mg total) by mouth 2 (two) times daily.   metoprolol tartrate (LOPRESSOR) 50 MG tablet Take 1 tablet (50 mg total) by mouth 2 (two) times daily.   ONETOUCH ULTRA test strip USE AS DIRECTED BY  PHYSICIAN   sitaGLIPtin (JANUVIA) 100 MG tablet Take 1 tablet (100 mg total) by mouth daily.    PHQ 2/9 Scores 10/28/2020 12/05/2019 10/01/2019 09/30/2019  PHQ - 2 Score 0 0 0 0  PHQ- 9 Score - 0 0 -    GAD 7 : Generalized Anxiety Score 12/05/2019 10/01/2019  Nervous, Anxious, on Edge 0 0  Control/stop worrying 0 0  Worry too much - different things 0 0  Trouble relaxing 0 0  Restless 0 0  Easily annoyed or irritable 0 0  Afraid - awful might happen 0 0  Total GAD 7 Score 0 0  Anxiety Difficulty Not difficult at all -    BP Readings from Last 3 Encounters:  05/14/21 (!) 100/42  05/13/21 (!) 159/68  05/12/21 128/65    Physical Exam Vitals and nursing note reviewed.  Cardiovascular:     Heart sounds: S1 normal and S2 normal. No murmur heard. No systolic murmur is present.  No diastolic murmur is present.    No S3 or S4 sounds.  Pulmonary:     Breath sounds: No decreased breath sounds, wheezing, rhonchi or rales.  Abdominal:     Palpations: Abdomen is soft. There is no hepatomegaly or splenomegaly.     Tenderness: There is no abdominal tenderness.  Musculoskeletal:     Right lower leg: No  edema.     Left lower leg: No edema.  Psychiatric:     Comments: somnolent    Wt Readings from Last 3 Encounters:  05/14/21 150 lb (68 kg)  05/13/21 150 lb (68 kg)  05/12/21 136 lb (61.7 kg)    BP (!) 100/42    Pulse 88    Ht $R'6\' 2"'pX$  (1.88 m)    Wt 150 lb (68 kg)    BMI 19.26 kg/m   Assessment and Plan:  1. Type 2 diabetes mellitus with other circulatory complication, without long-term current use of insulin (HCC) Chronic.  Uncontrolled.  Unstable.  Patient was last seen by me in 9/ 21/22  at which time A1c was noted to be 8.4 previously had been in the sevens.  Patient was maintained on oral therapy of Januvia glipizide and metformin.  At that time endocrine consult was placed and patient has been awaiting appointment since.  In the meantime patient has had medications on a regular basis as best can be conveyed by historian but following a somewhat questionable diet.  Most recently oral intake has been "not much "by the patient.  Blood sugar measurement at home this morning was 472 mg percent.  At this point in time I have concerns that blood sugar is out of control and will need to have likely IV insulin to bring it into control and close monitoring for insulin over the next couple of days until he can get on a baseline dose of insulin until his endocrine consult can be fulfilled.  Patient is unable to keep up oral intake for home insulin to be initiated without risk of hypoglycemia.  Until the blood sugars are in control is going to be difficult to get some infection under control so that another stent can be placed by vascular/Dr. Delana Meyer.  2. PVD (peripheral vascular disease) ([place]) Followed by vascular/Dr.Schnier.  History from at attending family member states that he is going to need another stent but this is not possible until infection is under better control with his obviously gangrene second toe.  Hospitalization is likely going to be needed for IV hydration and IV antibiotic since oral  intake is going to be suspected at home.  3. Amputation of left great toe [facility]) Patient is a BKA amputation on the right with a great toe amputation on the left and currently has gangrene of the second toe.  This will likely need to be amputated but apparently thoughts are improving circulation with stent needs to be done prior to surgery and this will require hospitalization to maximize the situation for impending amputation of second toe.  4. Gangrene of toe of left foot (HCC) Obvious gangrene of the second toe is aware of by podiatry Dr. Vickki Muff.  Again this is going to be subject to control of his diabetes/circulation/infection/prior to procedure that could be done.  5. Dehydration Patient has decreased mucous membranes and has had little oral intake of fluids by his measurement.  Patient likely needs IV hydration both for his hyperglycemia and correction of his hydration state.  Patient needs to be reevaluated for the above concerns and patient needs to have hospitalization for my opinion for correction of infection, dehydration, hyperglycemia, and worsening gangrene of viable foot.  Patient was noted to have a leukocytosis of 17,000 which is concerns for infection which I have doubts that doxycycline and oral form is going to be sufficient.  Patient is also somnolent and I doubt that he has the alert capacity to maintain hydration status and to take medications appropriately.

## 2021-05-14 NOTE — ED Triage Notes (Signed)
Pt was sent from Dr Yetta Barre office, they are concerned the pt is not his normal self, altered, confused, pt was seen here yesterday with similar sx , pt was brought in by his brother, states the pt lives with his wife who is not able to care for him at home alone

## 2021-05-15 ENCOUNTER — Emergency Department: Payer: Medicare Other

## 2021-05-15 DIAGNOSIS — R410 Disorientation, unspecified: Secondary | ICD-10-CM | POA: Diagnosis not present

## 2021-05-15 LAB — RESP PANEL BY RT-PCR (FLU A&B, COVID) ARPGX2
Influenza A by PCR: NEGATIVE
Influenza B by PCR: NEGATIVE
SARS Coronavirus 2 by RT PCR: NEGATIVE

## 2021-05-15 LAB — URINALYSIS, COMPLETE (UACMP) WITH MICROSCOPIC
Bacteria, UA: NONE SEEN
Bilirubin Urine: NEGATIVE
Glucose, UA: >=500 mg/dL — AB
Hgb urine dipstick: NEGATIVE
Ketones, ur: 20 mg/dL — AB
Leukocytes,Ua: NEGATIVE
Nitrite: NEGATIVE
Protein, ur: NEGATIVE mg/dL
Specific Gravity, Urine: 1.016 (ref 1.005–1.030)
pH: 5 (ref 5.0–8.0)

## 2021-05-15 LAB — CBG MONITORING, ED: Glucose-Capillary: 290 mg/dL — ABNORMAL HIGH (ref 70–99)

## 2021-05-15 LAB — TROPONIN I (HIGH SENSITIVITY): Troponin I (High Sensitivity): 14 ng/L

## 2021-05-15 MED ORDER — LACTATED RINGERS IV BOLUS
1000.0000 mL | Freq: Once | INTRAVENOUS | Status: AC
  Administered 2021-05-15: 1000 mL via INTRAVENOUS

## 2021-05-15 NOTE — Discharge Instructions (Addendum)
Continue the antibiotics given to you yesterday.  Drink plenty of fluids.  Continue to check your blood glucose at home and if it's persistently above 300, call your primary care doctor to discuss starting insulin.  Return to the emergency room for blood glucose greater than 400, fever, chest pain, shortness of breath, abdominal pain.

## 2021-05-15 NOTE — ED Provider Notes (Signed)
[facility] Provider Note    Event Date/Time   First MD Initiated Contact with Patient 05/14/21 2355     (approximate)   History   Altered Mental Status   HPI  Jose Schroeder is a 88 y.o. male  with history of DM, HTN, PVD, R BKA, L toe gangrene sent by his PCP for elevated BG and concerns for dehydration. Patient is currently on Januvia, metformin, and glipizide.  Endorses compliance with all 3 medications.  Has several dietary indiscretions.  Denies any fever or chills, cough or congestion, chest pain or shortness of breath, nausea or vomiting, abdominal pain, dysuria or hematuria.  Patient is accompanied by his brother who says the PCP felt the patient might be dehydrated and needed to come to the ER for evaluation.  There has been no altered mental status according to family who was at bedside.    Past Medical History:  Diagnosis Date   Diabetes mellitus without complication ([place])    Hypertension    Peripheral vascular disease ([place])     Past Surgical History:  Procedure Laterality Date   AMPUTATION TOE Right 01/15/2016   Procedure: AMPUTATION TOE;  Surgeon: Samara Deist, DPM;  Location: [facility] ORS;  Service: Podiatry;  Laterality: Right;   PERIPHERAL VASCULAR CATHETERIZATION Right 01/07/2016   Procedure: Abdominal Aortogram w/Lower Extremity;  Surgeon: Katha Cabal, MD;  Location: [place] CV LAB;  Service: Cardiovascular;  Laterality: Right;   TOE SURGERY     amputated 2 toes on L) foot     Physical Exam   Triage Vital Signs: ED Triage Vitals  Enc Vitals Group     BP 05/14/21 1458 (!) 118/59     Pulse Rate 05/14/21 1458 84     Resp 05/14/21 1458 16     Temp 05/14/21 1458 97.8 F (36.6 C)     Temp Source 05/14/21 1458 Oral     SpO2 05/14/21 1458 97 %     Weight 05/14/21 1456 149 lb 14.6 oz (68 kg)     Height 05/14/21 1456 6\' 2"  (1.88 m)     Head Circumference --      Peak Flow --      Pain Score 05/14/21 1456 0     Pain  Loc --      Pain Edu? --      Excl. in [place]? --     Most recent vital signs: Vitals:   05/15/21 0200 05/15/21 0230  BP: 130/67 (!) 160/71  Pulse: 93 88  Resp: 18 18  Temp:    SpO2: 100% 99%     Constitutional: Alert and oriented. Well appearing and in no apparent distress. HEENT:      Head: Normocephalic and atraumatic.         Eyes: Conjunctivae are normal. Sclera is non-icteric.       Mouth/Throat: Mucous membranes are dry.       Neck: Supple with no signs of meningismus. Cardiovascular: Regular rate and rhythm. No murmurs, gallops, or rubs. 2+ symmetrical distal pulses are present in all extremities.  Respiratory: Normal respiratory effort. Lungs are clear to auscultation bilaterally.  Gastrointestinal: Soft, non tender. Musculoskeletal:  Dry gangrene of the L 2nd toe with no surrounding erythema warmth or pus Neurologic: Normal speech and language. Face is symmetric. Moving all extremities. No gross focal neurologic deficits are appreciated. Skin: Skin is warm, dry and intact. No rash noted. Psychiatric: Mood and affect are normal. Speech and behavior are  normal.  ED Results / Procedures / Treatments   Labs (all labs ordered are listed, but only abnormal results are displayed) Labs Reviewed  COMPREHENSIVE METABOLIC PANEL - Abnormal; Notable for the following components:      Result Value   Glucose, Bld 353 (*)    BUN 28 (*)    GFR, Estimated 57 (*)    All other components within normal limits  CBC - Abnormal; Notable for the following components:   WBC 20.7 (*)    RBC 2.80 (*)    Hemoglobin 8.2 (*)    HCT 24.8 (*)    Platelets 405 (*)    All other components within normal limits  URINALYSIS, COMPLETE (UACMP) WITH MICROSCOPIC - Abnormal; Notable for the following components:   Color, Urine YELLOW (*)    APPearance HAZY (*)    Glucose, UA >=500 (*)    Ketones, ur 20 (*)    All other components within normal limits  RESP PANEL BY RT-PCR (FLU A&B, COVID) ARPGX2   CBG MONITORING, ED  CBG MONITORING, ED  TROPONIN I (HIGH SENSITIVITY)     EKG  ED ECG REPORT I, Rudene Re, the attending physician, personally viewed and interpreted this ECG.  Sinus rhythm with a right bundle branch block, no ST elevations or depressions.  Unchanged when compared to prior from 2017   RADIOLOGY I, Rudene Re, attending MD, have personally viewed and interpreted the images obtained during this visit as below:  Chest x-ray with no signs of pneumonia   ___________________________________________________ Interpretation by Radiologist:  DG Chest Portable 1 View  Result Date: 05/15/2021 CLINICAL DATA:  Confusion, elevated white blood cell count EXAM: PORTABLE CHEST 1 VIEW COMPARISON:  02/10/2021 FINDINGS: Heart and mediastinal contours are within normal limits. No focal opacities or effusions. No acute bony abnormality. IMPRESSION: No active disease. Electronically Signed   By: Rolm Baptise M.D.   On: 05/15/2021 00:40      PROCEDURES:  Critical Care performed: No  Procedures    IMPRESSION / MDM / ASSESSMENT AND PLAN / ED COURSE  I reviewed the triage vital signs and the nursing notes.  88 y.o. male  with history of DM, HTN, PVD, R BKA, L toe gangrene sent by his PCP for elevated BG and concerns for dehydration.  Patient does look dry on exam with dry mucous membranes, but otherwise hemodynamically stable, afebrile.  Patient has no complaints at this time.  Patient is accompanied by family who reports patient being at baseline.  Patient was seen here yesterday for confusion.  Patient was discharged on doxycycline due to elevated white count and concerns for possible infection over his gangrenous toe.  Today followed up with his PCP and there was concern the patient's blood glucose was elevated.  Patient's exam is unremarkable other than the gangrene of his toe which seems to be at his baseline.  He is otherwise alert and oriented x3 and neurologically  intact.  Ddx: Hyperglycemia, DKA, dehydration, AKI, electrolyte derangements, sepsis, UTI, pneumonia   Plan: CBC, BMP, urinalysis, chest x-ray, EKG, COVID and flu swabs, troponin   MEDICATIONS GIVEN IN ED: Medications  lactated ringers bolus 1,000 mL (1,000 mLs Intravenous New Bag/Given 05/15/21 0106)     ED COURSE: Chest x-ray with no signs of pneumonia.  COVID and flu negative.  EKG and troponin with no signs of ischemia.  Chemistry panel showing hyperglycemia with glucose of 353 but no signs of DKA.  Patient with mild AKI for which a liter  LR bolus was provided.  Patient does have persistent elevated white count since yesterday. Possibly stress induced from the gangrenous toe, currently on doxycycline.  There is no signs of overlying infection.  Abdomen is soft and nontender.  No signs of pneumonia.  UA negative for UTI.  Otherwise no signs of sepsis with no tachycardia, no tachypnea.  Patient was considered but with no signs of DKA, sepsis, or altered mental status I felt the patient was safe for discharge to the care of his brother.  Family feels comfortable taking him home.  Discussed close monitoring of blood glucose and close follow-up with PCP if sugars remain above 300 for possible initiation of insulin.  Recommended return to the emergency room for sugars greater than 400, fever, or any other signs of infection.  Otherwise close follow-up with his vascular surgeon for stenting as planned.   Consults: None   EMR reviewed including last visit with patient's podiatrist from 2 days ago and vascular surgery from 3 days ago.       FINAL CLINICAL IMPRESSION(S) / ED DIAGNOSES   Final diagnoses:  Dehydration  Hyperglycemia     Rx / DC Orders   ED Discharge Orders     None        Note:  This document was prepared using Dragon voice recognition software and may include unintentional dictation errors.   Alfred Levins, [place], MD 05/15/21 [phone]

## 2021-05-17 ENCOUNTER — Other Ambulatory Visit: Payer: Self-pay | Admitting: Family Medicine

## 2021-05-17 DIAGNOSIS — E0849 Diabetes mellitus due to underlying condition with other diabetic neurological complication: Secondary | ICD-10-CM

## 2021-05-17 DIAGNOSIS — E1159 Type 2 diabetes mellitus with other circulatory complications: Secondary | ICD-10-CM

## 2021-05-17 NOTE — Telephone Encounter (Signed)
Requested Prescriptions  Pending Prescriptions Disp Refills   gabapentin (NEURONTIN) 100 MG capsule [Pharmacy Med Name: Gabapentin 100 MG Oral Capsule] 270 capsule 3    Sig: TAKE 1 CAPSULE BY MOUTH 3 TIMES  DAILY     Neurology: Anticonvulsants - gabapentin Passed - 05/17/2021  4:17 AM      Passed - Valid encounter within last 12 months    Recent Outpatient Visits          3 days ago Type 2 diabetes mellitus with other circulatory complication, without long-term current use of insulin ([place])   [facility] Juline Patch, MD   2 months ago Hospital discharge follow-up   [facility] Juline Patch, MD   4 months ago Need for immunization against influenza   [facility] Juline Patch, MD   8 months ago Type 2 diabetes mellitus with other circulatory complication, without long-term current use of insulin ([place])   [facility] Juline Patch, MD   11 months ago Type 2 diabetes mellitus with other circulatory complication, without long-term current use of insulin ([place])   [facility] Juline Patch, MD              metFORMIN (GLUCOPHAGE) 500 MG tablet [Pharmacy Med Name: metFORMIN HCl 500 MG Oral Tablet] 180 tablet 3    Sig: TAKE 1 TABLET BY MOUTH  TWICE DAILY     Endocrinology:  Diabetes - Biguanides Failed - 05/17/2021  4:17 AM      Failed - HBA1C is between 0 and 7.9 and within 180 days    Hgb A1c MFr Bld  Date Value Ref Range Status  01/12/2021 8.7 (H) 4.8 - 5.6 % Final    Comment:             Prediabetes: 5.7 - 6.4          Diabetes: >6.4          Glycemic control for adults with diabetes: <7.0          Failed - AA eGFR in normal range and within 360 days    EGFR (African American)  Date Value Ref Range Status  11/12/2012 >60  Final   GFR calc Af Amer  Date Value Ref Range Status  10/01/2019 90 >59 mL/min/1.73 Final    Comment:    **[facility] currently reports eGFR in compliance with the current**   recommendations of  the [employer]. [facility] will   update reporting as new guidelines are published from the NKF-ASN   Task force.    EGFR (Non-African Amer.)  Date Value Ref Range Status  11/12/2012 >60  Final    Comment:    eGFR values <56mL/min/1.73 m2 may be an indication of chronic kidney disease (CKD). Calculated eGFR is useful in patients with stable renal function. The eGFR calculation will not be reliable in acutely ill patients when serum creatinine is changing rapidly. It is not useful in  patients on dialysis. The eGFR calculation may not be applicable to patients at the low and high extremes of body sizes, pregnant women, and vegetarians.    GFR, Estimated  Date Value Ref Range Status  05/14/2021 57 (L) >60 mL/min Final    Comment:    (NOTE) Calculated using the CKD-EPI Creatinine Equation (2021)    eGFR  Date Value Ref Range Status  02/19/2021 84 >59 mL/min/1.73 Final         Passed - Cr in normal range  and within 360 days    Creatinine  Date Value Ref Range Status  11/12/2012 0.99 0.60 - 1.30 mg/dL Final   Creatinine, Ser  Date Value Ref Range Status  05/14/2021 1.24 0.61 - 1.24 mg/dL Final         Passed - Valid encounter within last 6 months    Recent Outpatient Visits          3 days ago Type 2 diabetes mellitus with other circulatory complication, without long-term current use of insulin ([place])   [facility] Juline Patch, MD   2 months ago Hospital discharge follow-up   [facility] Juline Patch, MD   4 months ago Need for immunization against influenza   [facility] Juline Patch, MD   8 months ago Type 2 diabetes mellitus with other circulatory complication, without long-term current use of insulin ([place])   [facility] Juline Patch, MD   11 months ago Type 2 diabetes mellitus with other circulatory complication, without long-term current use of insulin [facility])   [facility] Juline Patch,  MD

## 2021-05-18 LAB — CULTURE, BLOOD (ROUTINE X 2)
Culture: NO GROWTH
Culture: NO GROWTH

## 2021-05-19 ENCOUNTER — Telehealth (INDEPENDENT_AMBULATORY_CARE_PROVIDER_SITE_OTHER): Payer: Self-pay

## 2021-05-19 NOTE — Telephone Encounter (Signed)
Spoke with the patient's spouse and he is scheduled with Dr. Gilda Crease for a LLE angio on 06/01/21 with a 9:00 am arrival time to the MM. Pre-procedure instructions were discussed and will be mailed.

## 2021-05-19 NOTE — Telephone Encounter (Signed)
°  The pts wife called an left a VM on the nurses line wanting to know about her husbands LLE angio being scheduled I spoke with the surgery scheduler who made me aware that she is currently waiting an authorization for the procedure

## 2021-05-19 NOTE — Telephone Encounter (Signed)
error 

## 2021-05-24 ENCOUNTER — Inpatient Hospital Stay
Admission: EM | Admit: 2021-05-24 | Discharge: 2021-06-07 | DRG: 854 | Disposition: A | Discharge status: Skilled Nursing Facility | Payer: Medicare Other | Attending: Internal Medicine | Admitting: Internal Medicine

## 2021-05-24 ENCOUNTER — Emergency Department: Payer: Medicare Other

## 2021-05-24 ENCOUNTER — Inpatient Hospital Stay: Payer: Medicare Other

## 2021-05-24 DIAGNOSIS — Z8249 Family history of ischemic heart disease and other diseases of the circulatory system: Secondary | ICD-10-CM | POA: Diagnosis not present

## 2021-05-24 DIAGNOSIS — E1152 Type 2 diabetes mellitus with diabetic peripheral angiopathy with gangrene: Secondary | ICD-10-CM | POA: Diagnosis present

## 2021-05-24 DIAGNOSIS — Z9889 Other specified postprocedural states: Secondary | ICD-10-CM | POA: Diagnosis not present

## 2021-05-24 DIAGNOSIS — E11628 Type 2 diabetes mellitus with other skin complications: Secondary | ICD-10-CM | POA: Diagnosis present

## 2021-05-24 DIAGNOSIS — M6259 Muscle wasting and atrophy, not elsewhere classified, multiple sites: Secondary | ICD-10-CM | POA: Diagnosis not present

## 2021-05-24 DIAGNOSIS — L97529 Non-pressure chronic ulcer of other part of left foot with unspecified severity: Secondary | ICD-10-CM | POA: Diagnosis present

## 2021-05-24 DIAGNOSIS — E1142 Type 2 diabetes mellitus with diabetic polyneuropathy: Secondary | ICD-10-CM | POA: Diagnosis not present

## 2021-05-24 DIAGNOSIS — Z79899 Other long term (current) drug therapy: Secondary | ICD-10-CM | POA: Diagnosis not present

## 2021-05-24 DIAGNOSIS — Z825 Family history of asthma and other chronic lower respiratory diseases: Secondary | ICD-10-CM | POA: Diagnosis not present

## 2021-05-24 DIAGNOSIS — R52 Pain, unspecified: Secondary | ICD-10-CM | POA: Diagnosis not present

## 2021-05-24 DIAGNOSIS — F0390 Unspecified dementia without behavioral disturbance: Secondary | ICD-10-CM | POA: Diagnosis present

## 2021-05-24 DIAGNOSIS — I70262 Atherosclerosis of native arteries of extremities with gangrene, left leg: Secondary | ICD-10-CM | POA: Diagnosis not present

## 2021-05-24 DIAGNOSIS — Z89511 Acquired absence of right leg below knee: Secondary | ICD-10-CM | POA: Diagnosis not present

## 2021-05-24 DIAGNOSIS — Z833 Family history of diabetes mellitus: Secondary | ICD-10-CM | POA: Diagnosis not present

## 2021-05-24 DIAGNOSIS — S199XXA Unspecified injury of neck, initial encounter: Secondary | ICD-10-CM | POA: Diagnosis not present

## 2021-05-24 DIAGNOSIS — I96 Gangrene, not elsewhere classified: Secondary | ICD-10-CM | POA: Diagnosis not present

## 2021-05-24 DIAGNOSIS — R600 Localized edema: Secondary | ICD-10-CM | POA: Diagnosis not present

## 2021-05-24 DIAGNOSIS — M7989 Other specified soft tissue disorders: Secondary | ICD-10-CM | POA: Diagnosis not present

## 2021-05-24 DIAGNOSIS — Z89512 Acquired absence of left leg below knee: Secondary | ICD-10-CM

## 2021-05-24 DIAGNOSIS — Z794 Long term (current) use of insulin: Secondary | ICD-10-CM | POA: Diagnosis not present

## 2021-05-24 DIAGNOSIS — I10 Essential (primary) hypertension: Secondary | ICD-10-CM | POA: Diagnosis present

## 2021-05-24 DIAGNOSIS — R4182 Altered mental status, unspecified: Secondary | ICD-10-CM

## 2021-05-24 DIAGNOSIS — D649 Anemia, unspecified: Secondary | ICD-10-CM | POA: Diagnosis not present

## 2021-05-24 DIAGNOSIS — D75838 Other thrombocytosis: Secondary | ICD-10-CM | POA: Diagnosis not present

## 2021-05-24 DIAGNOSIS — E1165 Type 2 diabetes mellitus with hyperglycemia: Secondary | ICD-10-CM | POA: Diagnosis present

## 2021-05-24 DIAGNOSIS — D638 Anemia in other chronic diseases classified elsewhere: Secondary | ICD-10-CM | POA: Diagnosis present

## 2021-05-24 DIAGNOSIS — S0990XA Unspecified injury of head, initial encounter: Secondary | ICD-10-CM | POA: Diagnosis not present

## 2021-05-24 DIAGNOSIS — F03918 Unspecified dementia, unspecified severity, with other behavioral disturbance: Secondary | ICD-10-CM | POA: Diagnosis not present

## 2021-05-24 DIAGNOSIS — Z20822 Contact with and (suspected) exposure to covid-19: Secondary | ICD-10-CM | POA: Diagnosis not present

## 2021-05-24 DIAGNOSIS — E11621 Type 2 diabetes mellitus with foot ulcer: Secondary | ICD-10-CM | POA: Diagnosis not present

## 2021-05-24 DIAGNOSIS — E119 Type 2 diabetes mellitus without complications: Secondary | ICD-10-CM

## 2021-05-24 DIAGNOSIS — S88912D Complete traumatic amputation of left lower leg, level unspecified, subsequent encounter: Secondary | ICD-10-CM | POA: Diagnosis not present

## 2021-05-24 DIAGNOSIS — R509 Fever, unspecified: Secondary | ICD-10-CM | POA: Diagnosis not present

## 2021-05-24 DIAGNOSIS — Z7401 Bed confinement status: Secondary | ICD-10-CM | POA: Diagnosis not present

## 2021-05-24 DIAGNOSIS — Z7984 Long term (current) use of oral hypoglycemic drugs: Secondary | ICD-10-CM | POA: Diagnosis not present

## 2021-05-24 DIAGNOSIS — L089 Local infection of the skin and subcutaneous tissue, unspecified: Secondary | ICD-10-CM | POA: Insufficient documentation

## 2021-05-24 DIAGNOSIS — E785 Hyperlipidemia, unspecified: Secondary | ICD-10-CM | POA: Diagnosis not present

## 2021-05-24 DIAGNOSIS — E7801 Familial hypercholesterolemia: Secondary | ICD-10-CM | POA: Diagnosis present

## 2021-05-24 DIAGNOSIS — G9340 Encephalopathy, unspecified: Secondary | ICD-10-CM | POA: Diagnosis present

## 2021-05-24 DIAGNOSIS — R739 Hyperglycemia, unspecified: Secondary | ICD-10-CM | POA: Diagnosis not present

## 2021-05-24 DIAGNOSIS — A419 Sepsis, unspecified organism: Secondary | ICD-10-CM | POA: Diagnosis not present

## 2021-05-24 DIAGNOSIS — R410 Disorientation, unspecified: Secondary | ICD-10-CM | POA: Diagnosis not present

## 2021-05-24 DIAGNOSIS — R404 Transient alteration of awareness: Secondary | ICD-10-CM | POA: Diagnosis not present

## 2021-05-24 DIAGNOSIS — Z7982 Long term (current) use of aspirin: Secondary | ICD-10-CM | POA: Diagnosis not present

## 2021-05-24 DIAGNOSIS — I2510 Atherosclerotic heart disease of native coronary artery without angina pectoris: Secondary | ICD-10-CM | POA: Diagnosis not present

## 2021-05-24 DIAGNOSIS — E611 Iron deficiency: Secondary | ICD-10-CM | POA: Diagnosis not present

## 2021-05-24 DIAGNOSIS — I739 Peripheral vascular disease, unspecified: Secondary | ICD-10-CM

## 2021-05-24 DIAGNOSIS — G319 Degenerative disease of nervous system, unspecified: Secondary | ICD-10-CM | POA: Diagnosis not present

## 2021-05-24 DIAGNOSIS — I4510 Unspecified right bundle-branch block: Secondary | ICD-10-CM | POA: Diagnosis not present

## 2021-05-24 DIAGNOSIS — R5381 Other malaise: Secondary | ICD-10-CM | POA: Diagnosis not present

## 2021-05-24 DIAGNOSIS — Z736 Limitation of activities due to disability: Secondary | ICD-10-CM | POA: Diagnosis not present

## 2021-05-24 DIAGNOSIS — L97519 Non-pressure chronic ulcer of other part of right foot with unspecified severity: Secondary | ICD-10-CM | POA: Diagnosis not present

## 2021-05-24 DIAGNOSIS — Z741 Need for assistance with personal care: Secondary | ICD-10-CM | POA: Diagnosis not present

## 2021-05-24 DIAGNOSIS — R6889 Other general symptoms and signs: Secondary | ICD-10-CM | POA: Diagnosis not present

## 2021-05-24 DIAGNOSIS — Z743 Need for continuous supervision: Secondary | ICD-10-CM | POA: Diagnosis not present

## 2021-05-24 LAB — COMPREHENSIVE METABOLIC PANEL
ALT: 26 U/L (ref 0–44)
AST: 41 U/L (ref 15–41)
Albumin: 3.4 g/dL — ABNORMAL LOW (ref 3.5–5.0)
Alkaline Phosphatase: 64 U/L (ref 38–126)
Anion gap: 10 (ref 5–15)
BUN: 27 mg/dL — ABNORMAL HIGH (ref 8–23)
CO2: 29 mmol/L (ref 22–32)
Calcium: 9.2 mg/dL (ref 8.9–10.3)
Chloride: 94 mmol/L — ABNORMAL LOW (ref 98–111)
Creatinine, Ser: 1.18 mg/dL (ref 0.61–1.24)
GFR, Estimated: >60 mL/min (ref >60)
Glucose, Bld: 399 mg/dL — ABNORMAL HIGH (ref 70–99)
Potassium: 4.5 mmol/L (ref 3.5–5.1)
Sodium: 133 mmol/L — ABNORMAL LOW (ref 135–145)
Total Bilirubin: 0.9 mg/dL (ref 0.3–1.2)
Total Protein: 7.9 g/dL (ref 6.5–8.1)

## 2021-05-24 LAB — IRON AND TIBC
Iron: 18 ug/dL — ABNORMAL LOW (ref 45–182)
Saturation Ratios: 11 % — ABNORMAL LOW (ref 17.9–39.5)
TIBC: 167 ug/dL — ABNORMAL LOW (ref 250–450)
UIBC: 149 ug/dL

## 2021-05-24 LAB — CBC WITH DIFFERENTIAL/PLATELET
Abs Immature Granulocytes: 0.23 10*3/uL — ABNORMAL HIGH (ref 0.00–0.07)
Basophils Absolute: 0 10*3/uL (ref 0.0–0.1)
Basophils Relative: 0 %
Eosinophils Absolute: 0 10*3/uL (ref 0.0–0.5)
Eosinophils Relative: 0 %
HCT: 20.6 % — ABNORMAL LOW (ref 39.0–52.0)
Hemoglobin: 6.5 g/dL — ABNORMAL LOW (ref 13.0–17.0)
Immature Granulocytes: 1 %
Lymphocytes Relative: 8 %
Lymphs Abs: 1.8 10*3/uL (ref 0.7–4.0)
MCH: 29.1 pg (ref 26.0–34.0)
MCHC: 31.6 g/dL (ref 30.0–36.0)
MCV: 92.4 fL (ref 80.0–100.0)
Monocytes Absolute: 1.2 10*3/uL — ABNORMAL HIGH (ref 0.1–1.0)
Monocytes Relative: 5 %
Neutro Abs: 20.1 10*3/uL — ABNORMAL HIGH (ref 1.7–7.7)
Neutrophils Relative %: 86 %
Platelets: 510 10*3/uL — ABNORMAL HIGH (ref 150–400)
RBC: 2.23 MIL/uL — ABNORMAL LOW (ref 4.22–5.81)
RDW: 13.2 % (ref 11.5–15.5)
WBC: 23.5 10*3/uL — ABNORMAL HIGH (ref 4.0–10.5)
nRBC: 0 % (ref 0.0–0.2)

## 2021-05-24 LAB — RESP PANEL BY RT-PCR (FLU A&B, COVID) ARPGX2
Influenza A by PCR: NEGATIVE
Influenza B by PCR: NEGATIVE
SARS Coronavirus 2 by RT PCR: NEGATIVE

## 2021-05-24 LAB — URINALYSIS, COMPLETE (UACMP) WITH MICROSCOPIC
Bacteria, UA: NONE SEEN
Bilirubin Urine: NEGATIVE
Glucose, UA: 500 mg/dL — AB
Ketones, ur: NEGATIVE mg/dL
Leukocytes,Ua: NEGATIVE
Nitrite: NEGATIVE
Specific Gravity, Urine: 1.01 (ref 1.005–1.030)
Squamous Epithelial / HPF: NONE SEEN (ref 0–5)
pH: 7 (ref 5.0–8.0)

## 2021-05-24 LAB — ABO/RH: ABO/RH(D): A POS

## 2021-05-24 LAB — PROTIME-INR
INR: 1.2 (ref 0.8–1.2)
Prothrombin Time: 15 seconds (ref 11.4–15.2)

## 2021-05-24 LAB — PREPARE RBC (CROSSMATCH)

## 2021-05-24 LAB — APTT: aPTT: 38 seconds — ABNORMAL HIGH (ref 24–36)

## 2021-05-24 LAB — CBG MONITORING, ED: Glucose-Capillary: 152 mg/dL — ABNORMAL HIGH (ref 70–99)

## 2021-05-24 LAB — LACTIC ACID, PLASMA: Lactic Acid, Venous: 1.5 mmol/L (ref 0.5–1.9)

## 2021-05-24 LAB — FERRITIN: Ferritin: 643 ng/mL — ABNORMAL HIGH (ref 24–336)

## 2021-05-24 LAB — SAVE SMEAR(SSMR), FOR PROVIDER SLIDE REVIEW

## 2021-05-24 MED ORDER — VANCOMYCIN HCL IN DEXTROSE 1-5 GM/200ML-% IV SOLN
1000.0000 mg | Freq: Once | INTRAVENOUS | Status: AC
  Administered 2021-05-24: 1000 mg via INTRAVENOUS
  Filled 2021-05-24: qty 200

## 2021-05-24 MED ORDER — VANCOMYCIN HCL 500 MG/100ML IV SOLN
500.0000 mg | Freq: Once | INTRAVENOUS | Status: AC
  Administered 2021-05-24: 500 mg via INTRAVENOUS
  Filled 2021-05-24: qty 100

## 2021-05-24 MED ORDER — METOPROLOL TARTRATE 50 MG PO TABS
50.0000 mg | ORAL_TABLET | Freq: Two times a day (BID) | ORAL | Status: DC
  Administered 2021-05-24 – 2021-06-07 (×28): 50 mg via ORAL
  Filled 2021-05-24 (×28): qty 1

## 2021-05-24 MED ORDER — VANCOMYCIN HCL 1000 MG/200ML IV SOLN
1000.0000 mg | INTRAVENOUS | Status: DC
  Filled 2021-05-24: qty 200

## 2021-05-24 MED ORDER — SODIUM CHLORIDE 0.9 % IV BOLUS
1000.0000 mL | Freq: Once | INTRAVENOUS | Status: AC
  Administered 2021-05-24: 1000 mL via INTRAVENOUS

## 2021-05-24 MED ORDER — PIPERACILLIN-TAZOBACTAM 3.375 G IVPB 30 MIN
3.3750 g | Freq: Once | INTRAVENOUS | Status: AC
  Administered 2021-05-24: 3.375 g via INTRAVENOUS
  Filled 2021-05-24: qty 50

## 2021-05-24 MED ORDER — SODIUM CHLORIDE 0.9 % IV SOLN
250.0000 mL | INTRAVENOUS | Status: DC | PRN
  Administered 2021-05-25: 250 mL via INTRAVENOUS

## 2021-05-24 MED ORDER — SODIUM CHLORIDE 0.9 % IV SOLN: 10.0000 mL/h | Freq: Once | INTRAVENOUS | Status: DC

## 2021-05-24 MED ORDER — PIPERACILLIN-TAZOBACTAM 3.375 G IVPB
3.3750 g | Freq: Three times a day (TID) | INTRAVENOUS | Status: DC
  Administered 2021-05-24 – 2021-05-28 (×11): 3.375 g via INTRAVENOUS
  Filled 2021-05-24 (×11): qty 50

## 2021-05-24 MED ORDER — INSULIN GLARGINE-YFGN 100 UNIT/ML ~~LOC~~ SOLN
10.0000 [IU] | Freq: Every day | SUBCUTANEOUS | Status: DC
  Administered 2021-05-24 – 2021-05-25 (×2): 10 [IU] via SUBCUTANEOUS
  Filled 2021-05-24 (×3): qty 0.1

## 2021-05-24 MED ORDER — INSULIN ASPART 100 UNIT/ML IJ SOLN
0.0000 [IU] | Freq: Every day | INTRAMUSCULAR | Status: DC
  Administered 2021-05-25 – 2021-05-30 (×5): 2 [IU] via SUBCUTANEOUS
  Administered 2021-06-03: 3 [IU] via SUBCUTANEOUS
  Filled 2021-05-24 (×6): qty 1

## 2021-05-24 MED ORDER — ATORVASTATIN CALCIUM 20 MG PO TABS
20.0000 mg | ORAL_TABLET | Freq: Every evening | ORAL | Status: DC
  Administered 2021-05-24 – 2021-06-06 (×12): 20 mg via ORAL
  Filled 2021-05-24 (×12): qty 1

## 2021-05-24 MED ORDER — SODIUM CHLORIDE 0.9 % IV BOLUS
1000.0000 mL | Freq: Once | INTRAVENOUS | Status: AC
Start: 2021-05-24 — End: 2021-05-24
  Administered 2021-05-24: 1000 mL via INTRAVENOUS

## 2021-05-24 MED ORDER — SODIUM CHLORIDE 0.9% FLUSH: 3.0000 mL | INTRAVENOUS | Status: DC | PRN

## 2021-05-24 MED ORDER — INSULIN ASPART 100 UNIT/ML IJ SOLN
0.0000 [IU] | Freq: Three times a day (TID) | INTRAMUSCULAR | Status: DC
  Administered 2021-05-24: 8 [IU] via SUBCUTANEOUS
  Administered 2021-05-25: 2 [IU] via SUBCUTANEOUS
  Administered 2021-05-26: 5 [IU] via SUBCUTANEOUS
  Administered 2021-05-27: 3 [IU] via SUBCUTANEOUS
  Administered 2021-05-27: 8 [IU] via SUBCUTANEOUS
  Administered 2021-05-27: 2 [IU] via SUBCUTANEOUS
  Administered 2021-05-28 (×2): 3 [IU] via SUBCUTANEOUS
  Administered 2021-05-28 – 2021-05-29 (×2): 2 [IU] via SUBCUTANEOUS
  Administered 2021-05-29: 8 [IU] via SUBCUTANEOUS
  Administered 2021-05-29: 3 [IU] via SUBCUTANEOUS
  Administered 2021-05-30: 2 [IU] via SUBCUTANEOUS
  Administered 2021-05-30: 8 [IU] via SUBCUTANEOUS
  Administered 2021-05-31: 3 [IU] via SUBCUTANEOUS
  Administered 2021-05-31: 5 [IU] via SUBCUTANEOUS
  Administered 2021-05-31: 3 [IU] via SUBCUTANEOUS
  Administered 2021-06-02: 8 [IU] via SUBCUTANEOUS
  Administered 2021-06-03: 11 [IU] via SUBCUTANEOUS
  Administered 2021-06-03: 5 [IU] via SUBCUTANEOUS
  Administered 2021-06-04: 8 [IU] via SUBCUTANEOUS
  Administered 2021-06-04: 2 [IU] via SUBCUTANEOUS
  Administered 2021-06-05: 3 [IU] via SUBCUTANEOUS
  Administered 2021-06-05 – 2021-06-07 (×2): 2 [IU] via SUBCUTANEOUS
  Administered 2021-06-07: 3 [IU] via SUBCUTANEOUS
  Filled 2021-05-24 (×27): qty 1

## 2021-05-24 MED ORDER — SODIUM CHLORIDE 0.9% FLUSH
3.0000 mL | Freq: Two times a day (BID) | INTRAVENOUS | Status: DC
  Administered 2021-05-24 – 2021-05-31 (×9): 3 mL via INTRAVENOUS

## 2021-05-24 NOTE — Consult Note (Signed)
CODE SEPSIS - PHARMACY COMMUNICATION  **Broad Spectrum Antibiotics should be administered within 1 hour of Sepsis diagnosis**  Time Code Sepsis Called/Page Received: 1303  Antibiotics Ordered: 1301  Time of 1st antibiotic administration: 1308      Sharen Hones ,PharmD, BCPS Clinical Pharmacist  05/24/2021  1:15 PM

## 2021-05-24 NOTE — Consult Note (Signed)
Pharmacy Antibiotic Note  Jose Schroeder is a 88 y.o. male admitted on 05/24/2021 with  diabetic foot infection .  Pharmacy has been consulted for Vancomycin and Zosyn dosing.  Plan: Zosyn 3.375g IV q8h (4 hour infusion). Vancomycin 1000 mg x 1 given in ED. Will give another 500 mg for total LD of 1500 mg Initiate Vancomycin 1000 mg Q24H. Goal AUC 400-600 Estimated AUC 552/Cmin: 14.5 Scr 1.18, TBW, Vd 0.72    Height: 5\' 10"  (177.8 cm) Weight: 65 kg (143 lb 4.8 oz) IBW/kg (Calculated) : 73  Temp (24hrs), Avg:99 F (37.2 C), Min:99 F (37.2 C), Max:99 F (37.2 C)  Recent Labs  Lab 05/24/21 1252  WBC 23.5*  CREATININE 1.18  LATICACIDVEN 1.5    Estimated Creatinine Clearance: 41.3 mL/min (by C-G formula based on SCr of 1.18 mg/dL).    No Known Allergies  Antimicrobials this admission: 1/30 Zosyn >>  1/30 Vancomycin >>   Dose adjustments this admission:   Microbiology results: 1/30 BCx: sent 1/30 UCx: sent    Thank you for allowing pharmacy to be a part of this patients care.  2/30, PharmD, BCPS Clinical Pharmacist   05/24/2021 3:54 PM

## 2021-05-24 NOTE — Consult Note (Signed)
PHARMACY -  BRIEF ANTIBIOTIC NOTE   Pharmacy has received consult(s) for vancomycin  from an ED provider.  The patient's profile has been reviewed for ht/wt/allergies/indication/available labs.    One time order(s) placed for  Vancomycin 1000 mg x 1   Further antibiotics/pharmacy consults should be ordered by admitting physician if indicated.                       Thank you, Sharen Hones, PharmD, BCPS Clinical Pharmacist   05/24/2021  1:16 PM

## 2021-05-24 NOTE — ED Provider Notes (Signed)
[facility] Provider Note    Event Date/Time   First MD Initiated Contact with Patient 05/24/21 1255     (approximate)   History   Altered Mental Status (Hyperglycemia , sugars 500's, pt has fever, left foot wound toes black, rrate 25)   HPI  Jose Schroeder is a 88 y.o. 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  Patient was noted to be more confused per family.  Patient was noted be febrile with EMS with concern for necrotic toe on examination.  Patient is able to wake up and state his name but otherwise not able to give me any history.  Physical Exam   Triage Vital Signs: ED Triage Vitals [05/24/21 1249]  Enc Vitals Group     BP      Pulse Rate 86     Resp (!) 25     Temp 99 F (37.2 C)     Temp Source Oral     SpO2 100 %     Weight 143 lb 4.8 oz (65 kg)     Height 5\' 10"  (1.778 m)     Head Circumference      Peak Flow      Pain Score 0     Pain Loc      Pain Edu?      Excl. in GC?     Most recent vital signs: Vitals:   05/24/21 1249  Pulse: 86  Resp: (!) 25  Temp: 99 F (37.2 C)  SpO2: 100%     General: Patient is sleepy but able to wake up and state their name but is otherwise not oriented CV:  Good peripheral perfusion.  Resp:  Normal effort.  Abd:  No distention.  Nontender Other:  Right below-knee amputation.  The left leg with necrotic second toe with good distal pulse.  Patient able to squeeze the bilateral hands and move the bilateral legs. Small hematoma noted to the back of head  ED Results / Procedures / Treatments   Labs (all labs ordered are listed, but only abnormal results are displayed) Labs Reviewed  RESP PANEL BY RT-PCR (FLU A&B, COVID) ARPGX2  CULTURE, BLOOD (ROUTINE X 2)  CULTURE, BLOOD (ROUTINE X 2)  URINE CULTURE  LACTIC ACID, PLASMA  LACTIC ACID, PLASMA  COMPREHENSIVE METABOLIC PANEL  CBC WITH  DIFFERENTIAL/PLATELET  PROTIME-INR  APTT  URINALYSIS, COMPLETE (UACMP) WITH MICROSCOPIC     EKG  My interpretation of EKG:  Sinus rate of 85 without any ST elevation or T wave inversions,> block  RADIOLOGY I have reviewed the xray personally and agree with radiology read chest x-ray was negative and foot x-ray with some gas over the distal second middle phalanx   PROCEDURES:  Critical Care performed: Yes, see critical care procedure note(s)  .1-3 Lead EKG Interpretation Performed by: 05/26/21, MD Authorized by: Concha Se, MD     Interpretation: normal     ECG rate:  80   ECG rate assessment: normal     Rhythm: sinus rhythm     Ectopy: none     Conduction: normal   .Critical Care Performed by: Concha Se, MD Authorized by: Concha Se, MD   Critical care provider statement:    Critical care time (minutes):  30   Critical care was necessary to treat or prevent imminent or life-threatening deterioration of the following conditions:  Sepsis   Critical care was time spent personally by me on the following activities:  Development of treatment plan with patient or surrogate, discussions with consultants, evaluation of patient's response to treatment, examination of patient, ordering and review of laboratory studies, ordering and review of radiographic studies, ordering and performing treatments and interventions, pulse oximetry, re-evaluation of patient's condition and review of old charts   MEDICATIONS ORDERED IN ED: Medications  metoprolol tartrate (LOPRESSOR) tablet 50 mg (has no administration in time range)  atorvastatin (LIPITOR) tablet 20 mg (has no administration in time range)  sodium chloride flush (NS) 0.9 % injection 3 mL (3 mLs Intravenous Given 05/24/21 1532)  sodium chloride flush (NS) 0.9 % injection 3 mL (has no administration in time range)  0.9 %  sodium chloride infusion (has no administration in time range)  insulin glargine-yfgn (SEMGLEE)  injection 10 Units (has no administration in time range)  insulin aspart (novoLOG) injection 0-15 Units (has no administration in time range)  insulin aspart (novoLOG) injection 0-5 Units (has no administration in time range)  vancomycin (VANCOCIN) IVPB 1000 mg/200 mL premix (0 mg Intravenous Stopped 05/24/21 1452)  piperacillin-tazobactam (ZOSYN) IVPB 3.375 g (0 g Intravenous Stopped 05/24/21 1356)  sodium chloride 0.9 % bolus 1,000 mL (0 mLs Intravenous Stopped 05/24/21 1453)  sodium chloride 0.9 % bolus 1,000 mL (1,000 mLs Intravenous New Bag/Given 05/24/21 1504)     IMPRESSION / MDM / ASSESSMENT AND PLAN / ED COURSE  I reviewed the triage vital signs and the nursing notes.  Differential diagnosis includes, but is not limited to, sepsis, infection and secondary to necrotic toe.  Good distal pulse.  Suspect this is from his diabetes.  Patient significantly hyperglycemic so fluids and antibiotics will be started immediately.  Labs ordered evaluate for any other Electra abnormalities, AKI, x-ray evaluate for osteo-.  We will start patient on broad-spectrum antibiotics with Vanco and Zosyn.  Given small hematoma on head will get CT head to evaluate  CT head is negative, CT cervical is negative,  Lactate is normal CBC significantly elevated white count.  Hemoglobin is low CMP glucose elevated but no DKA Coags are normal   Rectal exam with green stool but Hemoccult negative.  I did call the patient's family member to update on admission for concern for septic from a toe but also to consent for blood.  She is okay with patient getting blood  Will discussed with the hospital team for admission.  Have also messaged vascular surgery and podiatry to also get involved in patient's case for the necrotic toe  The patient is on the cardiac monitor to evaluate for evidence of arrhythmia and/or significant heart rate changes.   FINAL CLINICAL IMPRESSION(S) / ED DIAGNOSES   Final diagnoses:  Altered  mental status, unspecified altered mental status type  Necrotic toes (HCC)  Symptomatic anemia     Rx / DC Orders   ED Discharge Orders     None        Note:  This document was prepared using Dragon voice recognition software and may include unintentional dictation errors.   Concha Se, MD 05/24/21 [phone]

## 2021-05-24 NOTE — ED Notes (Signed)
Pt taken to MRI at this time accompanied by [employer], NT.

## 2021-05-24 NOTE — ED Notes (Addendum)
Xray finished at [facility], IVF and ABX continue/ infusing.

## 2021-05-24 NOTE — Sepsis Progress Note (Signed)
ELink monitoring sepsis protocol 

## 2021-05-24 NOTE — H&P (Signed)
History and Physical    KHADAR MONGER KYH:[id number] DOB: 02-19-1933 DOA: 05/24/2021  PCP: Duanne Limerick, MD  Patient coming from: home by EMS   Chief Complaint: AMS  HPI: Jose Schroeder is a 88 y.o. male with medical history significant for dementia, uncontrolled DM, hTN, PAD, right BKA, presenting with the above.  Has a gangrenous left toe present for at least several weeks. Has been seen recently by podiatry and vascular surgery. Plan was angiogram with attempt at revascularization w/ vascular prior to amputation. Wife reports that patient has been progressively more confused the last several days and thus she contacted EMS. No n/v/d. She is also concerned about his hyperglycemia, as high as 500 at home. No fevers. No report of cough or chest pain  ED Course:   Vanc/zosyn, podiatry consulted, labs obtained.  Review of Systems: As per HPI otherwise 10 point review of systems negative.    Past Medical History:  Diagnosis Date   Diabetes mellitus without complication (HCC)    Hypertension    Peripheral vascular disease (HCC)     Past Surgical History:  Procedure Laterality Date   AMPUTATION TOE Right 01/15/2016   Procedure: AMPUTATION TOE;  Surgeon: Gwyneth Revels, DPM;  Location: [facility] ORS;  Service: Podiatry;  Laterality: Right;   PERIPHERAL VASCULAR CATHETERIZATION Right 01/07/2016   Procedure: Abdominal Aortogram w/Lower Extremity;  Surgeon: Renford Dills, MD;  Location: [facility] INVASIVE CV LAB;  Service: Cardiovascular;  Laterality: Right;   TOE SURGERY     amputated 2 toes on L) foot     reports that he has never smoked. He has never used smokeless tobacco. He reports that he does not drink alcohol and does not use drugs.  No Known Allergies  Family History  Problem Relation Age of Onset   Diabetes Mother    Hypertension Mother    Diabetes Father    Hypertension Father    Diabetes Daughter     Prior to Admission medications   Medication Sig Start  Date End Date Taking? Authorizing Provider  acetaminophen (TYLENOL) 325 MG tablet Take 2 tablets (650 mg total) by mouth every 6 (six) hours as needed for mild pain (or Fever >/= 101). 02/13/21   Marguerita Merles Latif, DO  amLODipine (NORVASC) 10 MG tablet Take 1 tablet (10 mg total) by mouth daily. 02/19/21   Duanne Limerick, MD  aspirin EC 81 MG tablet Take 1 tablet (81 mg total) by mouth daily. 01/08/16   Shaune Pollack, MD  atorvastatin (LIPITOR) 20 MG tablet Take 1 tablet (20 mg total) by mouth daily. 02/19/21   Duanne Limerick, MD  ferrous sulfate 325 (65 FE) MG tablet Take 1 tablet (325 mg total) by mouth daily with breakfast. 04/13/20   Duanne Limerick, MD  gabapentin (NEURONTIN) 100 MG capsule TAKE 1 CAPSULE BY MOUTH 3 TIMES  DAILY 05/17/21   Duanne Limerick, MD  glipiZIDE (GLUCOTROL XL) 10 MG 24 hr tablet Take 1 tablet (10 mg total) by mouth daily with breakfast. 02/19/21   Duanne Limerick, MD  hydrochlorothiazide (HYDRODIURIL) 25 MG tablet Take 1 tablet (25 mg total) by mouth daily. 02/19/21   Duanne Limerick, MD  lisinopril (ZESTRIL) 10 MG tablet Take 1 tablet (10 mg total) by mouth daily. 02/19/21   Duanne Limerick, MD  metFORMIN (GLUCOPHAGE) 500 MG tablet TAKE 1 TABLET BY MOUTH  TWICE DAILY 05/17/21   Duanne Limerick, MD  metoprolol tartrate (LOPRESSOR) 50 MG tablet Take  1 tablet (50 mg total) by mouth 2 (two) times daily. 02/22/21   Duanne Limerick, MD  [facility] ULTRA test strip USE AS DIRECTED BY  PHYSICIAN 12/03/19   Duanne Limerick, MD  sitaGLIPtin (JANUVIA) 100 MG tablet Take 1 tablet (100 mg total) by mouth daily. 02/19/21   Duanne Limerick, MD    Physical Exam: Vitals:   05/24/21 1249 05/24/21 1300 05/24/21 1345  BP:  (!) 112/49 (!) 103/47  Pulse: 86 84 80  Resp: (!) 25 18 15   Temp: 99 F (37.2 C)    TempSrc: Oral    SpO2: 100% 100% 100%  Weight: 65 kg    Height: 5\' 10"  (1.778 m)      Constitutional: No acute distress, confused, chronically ill appearing Head:  Atraumatic Eyes: Conjunctiva clear ENM: Moist mucous membranes.  Neck: Supple Respiratory: Clear to auscultation bilaterally, no wheezing/rales/rhonchi. Normal respiratory effort. No accessory muscle use. . Cardiovascular: Regular rate and rhythm. No murmurs/rubs/gallops. Abdomen: Non-tender, non-distended. No masses. No rebound or guarding. Positive bowel sounds. Musculoskeletal: No joint deformity upper and lower extremities. Normal ROM, no contractures. Normal muscle tone.  Skin: dark gangrenous left second toe with skin breakdown Extremities: right bka. Edema and erythema left foot Neurologic: Alert, moving all 4 extremities. Psychiatric: confused   Labs on Admission: I have personally reviewed following labs and imaging studies  CBC: Recent Labs  Lab 05/24/21 1252  WBC 23.5*  NEUTROABS 20.1*  HGB 6.5*  HCT 20.6*  MCV 92.4  PLT 510*   Basic Metabolic Panel: Recent Labs  Lab 05/24/21 1252  NA 133*  K 4.5  CL 94*  CO2 29  GLUCOSE 399*  BUN 27*  CREATININE 1.18  CALCIUM 9.2   GFR: Estimated Creatinine Clearance: 41.3 mL/min (by C-G formula based on SCr of 1.18 mg/dL). Liver Function Tests: Recent Labs  Lab 05/24/21 1252  AST 41  ALT 26  ALKPHOS 64  BILITOT 0.9  PROT 7.9  ALBUMIN 3.4*   No results for input(s): LIPASE, AMYLASE in the last 168 hours. No results for input(s): AMMONIA in the last 168 hours. Coagulation Profile: Recent Labs  Lab 05/24/21 1252  INR 1.2   Cardiac Enzymes: No results for input(s): CKTOTAL, CKMB, CKMBINDEX, TROPONINI in the last 168 hours. BNP (last 3 results) No results for input(s): PROBNP in the last 8760 hours. HbA1C: No results for input(s): HGBA1C in the last 72 hours. CBG: No results for input(s): GLUCAP in the last 168 hours. Lipid Profile: No results for input(s): CHOL, HDL, LDLCALC, TRIG, CHOLHDL, LDLDIRECT in the last 72 hours. Thyroid Function Tests: No results for input(s): TSH, T4TOTAL, FREET4, T3FREE,  THYROIDAB in the last 72 hours. Anemia Panel: No results for input(s): VITAMINB12, FOLATE, FERRITIN, TIBC, IRON, RETICCTPCT in the last 72 hours. Urine analysis:    Component Value Date/Time   COLORURINE YELLOW (A) 05/14/2021 2359   APPEARANCEUR HAZY (A) 05/14/2021 2359   LABSPEC 1.016 05/14/2021 2359   PHURINE 5.0 05/14/2021 2359   GLUCOSEU >=500 (A) 05/14/2021 2359   HGBUR NEGATIVE 05/14/2021 2359   BILIRUBINUR NEGATIVE 05/14/2021 2359   KETONESUR 20 (A) 05/14/2021 2359   PROTEINUR NEGATIVE 05/14/2021 2359   NITRITE NEGATIVE 05/14/2021 2359   LEUKOCYTESUR NEGATIVE 05/14/2021 2359    Radiological Exams on Admission: CT HEAD WO CONTRAST (05/16/2021)  Result Date: 05/24/2021 CLINICAL DATA:  Head and neck trauma.  Hyperglycemia.  Fever. EXAM: CT HEAD WITHOUT CONTRAST CT CERVICAL SPINE WITHOUT CONTRAST TECHNIQUE: Multidetector CT imaging of the  head and cervical spine was performed following the standard protocol without intravenous contrast. Multiplanar CT image reconstructions of the cervical spine were also generated. RADIATION DOSE REDUCTION: This exam was performed according to the departmental dose-optimization program which includes automated exposure control, adjustment of the mA and/or kV according to patient size and/or use of iterative reconstruction technique. COMPARISON:  Head CT 02/10/2021 FINDINGS: CT HEAD FINDINGS Brain: There is no evidence of an acute infarct, intracranial hemorrhage, mass, midline shift, or extra-axial fluid collection. Generalized cerebral atrophy is mild for age. Hypodensities in the cerebral white matter bilaterally are unchanged and nonspecific but compatible with mild-to-moderate chronic small vessel ischemic disease. Vascular: Calcified atherosclerosis at the skull base. No hyperdense vessel. Skull: No acute fracture or suspicious osseous lesion. Sinuses/Orbits: Visualized paranasal sinuses and mastoid air cells are clear. Bilateral cataract extraction. Other:  None. CT CERVICAL SPINE FINDINGS Alignment: Straightening of the normal cervical lordosis. Trace retrolisthesis of C5 on C6 and C6 on C7. Skull base and vertebrae: No acute fracture or suspicious osseous lesion. Soft tissues and spinal canal: No prevertebral fluid or swelling. No visible canal hematoma. Disc levels: Moderately advanced disc degeneration from C3-4 to C6-7. Moderate multilevel facet arthrosis in the cervical and included upper thoracic spine. Partial interbody and bilateral facet ankylosis at C4-5. Moderate multilevel neural foraminal stenosis due to uncovertebral and facet spurring. Multilevel spinal stenosis, moderate at C3-4. Upper chest: Biapical pleuroparenchymal lung scarring. Other: Prominent calcific atherosclerosis at the carotid bifurcations. IMPRESSION: 1. No evidence of acute intracranial abnormality. 2. Mild-to-moderate chronic small vessel ischemic disease. 3. No evidence of acute cervical spine fracture. Electronically Signed   By: Sebastian AcheAllen  Grady M.D.   On: 05/24/2021 13:46   CT Cervical Spine Wo Contrast  Result Date: 05/24/2021 CLINICAL DATA:  Head and neck trauma.  Hyperglycemia.  Fever. EXAM: CT HEAD WITHOUT CONTRAST CT CERVICAL SPINE WITHOUT CONTRAST TECHNIQUE: Multidetector CT imaging of the head and cervical spine was performed following the standard protocol without intravenous contrast. Multiplanar CT image reconstructions of the cervical spine were also generated. RADIATION DOSE REDUCTION: This exam was performed according to the departmental dose-optimization program which includes automated exposure control, adjustment of the mA and/or kV according to patient size and/or use of iterative reconstruction technique. COMPARISON:  Head CT 02/10/2021 FINDINGS: CT HEAD FINDINGS Brain: There is no evidence of an acute infarct, intracranial hemorrhage, mass, midline shift, or extra-axial fluid collection. Generalized cerebral atrophy is mild for age. Hypodensities in the cerebral  white matter bilaterally are unchanged and nonspecific but compatible with mild-to-moderate chronic small vessel ischemic disease. Vascular: Calcified atherosclerosis at the skull base. No hyperdense vessel. Skull: No acute fracture or suspicious osseous lesion. Sinuses/Orbits: Visualized paranasal sinuses and mastoid air cells are clear. Bilateral cataract extraction. Other: None. CT CERVICAL SPINE FINDINGS Alignment: Straightening of the normal cervical lordosis. Trace retrolisthesis of C5 on C6 and C6 on C7. Skull base and vertebrae: No acute fracture or suspicious osseous lesion. Soft tissues and spinal canal: No prevertebral fluid or swelling. No visible canal hematoma. Disc levels: Moderately advanced disc degeneration from C3-4 to C6-7. Moderate multilevel facet arthrosis in the cervical and included upper thoracic spine. Partial interbody and bilateral facet ankylosis at C4-5. Moderate multilevel neural foraminal stenosis due to uncovertebral and facet spurring. Multilevel spinal stenosis, moderate at C3-4. Upper chest: Biapical pleuroparenchymal lung scarring. Other: Prominent calcific atherosclerosis at the carotid bifurcations. IMPRESSION: 1. No evidence of acute intracranial abnormality. 2. Mild-to-moderate chronic small vessel ischemic disease. 3. No evidence of  acute cervical spine fracture. Electronically Signed   By: Sebastian AcheAllen  Grady M.D.   On: 05/24/2021 13:46   DG Chest Port 1 View  Result Date: 05/24/2021 CLINICAL DATA:  Hyperglycemia. Right foot discoloration with fever. Questionable sepsis. EXAM: PORTABLE CHEST 1 VIEW COMPARISON:  Radiographs 05/15/2021 and 02/10/2021. FINDINGS: 1348 hours. Low lung volumes. The heart size and mediastinal contours are normal. The lungs are clear. There is no pleural effusion or pneumothorax. No acute osseous findings are identified. Telemetry leads overlie the chest. IMPRESSION: No evidence of active infection in the chest. Electronically Signed   By: Carey BullocksWilliam   Veazey M.D.   On: 05/24/2021 13:58   DG Foot Complete Left  Result Date: 05/24/2021 CLINICAL DATA:  Questionable sepsis.  Hyperglycemia.  Black toes. EXAM: LEFT FOOT - COMPLETE 3+ VIEW COMPARISON:  None. FINDINGS: Bones are diffusely osteopenic. Status post first ray amputation at the level of the PIP joint. Fourth ray amputation is identified at the level of the base of the proximal phalanx. Plantar subluxation of the second and third D IP joints identified. No definite fracture or dislocation identified. No focal bone erosions noted. There is diffuse subcutaneous gas overlying the second middle and distal phalanx. IMPRESSION: 1. No acute bone abnormality. 2. Diffuse subcutaneous gas overlying the second middle and distal phalanx. Cannot rule out soft tissue infection. Consider further evaluation with contrast enhanced MRI of the foot. Electronically Signed   By: Signa Kellaylor  Stroud M.D.   On: 05/24/2021 14:01    EKG: Independently reviewed. sinus  Assessment/Plan Principal Problem:   Diabetic foot infection (HCC) Active Problems:   Diabetes mellitus (HCC)   Essential hypertension   Hx of BKA, left (HCC)   Toe gangrene (HCC)   # Diabetic foot infection # Gangrene # Sepsis Sepsis by encephalopathy, elevated WBCs. Hemodynamically stable. - EDP has consulted podiatry who will see - I also asked EDP to consult vascular surgery as pt is established w/ them and outpatient plan was angiography with revascularization prior to amputation - defer imaging such as mri to podiatry - continue vanc/zosyn - f/u blood cultures, unfortunately abx have already been given and blood cultures not yet obtained  # Acute encephalopathy # Dementia Likely 2/2 above infection on underlying dementia. CTH w/o signs acute process - treat as above  # Anemia, normocytic No report of hematochezia or melena and guiac neg in ED but hgb 6.5, was 8s more recently - 1 u prbc ordered in the ED - will monitor for now. Will  also check iron panel, smear, b12  # PAD - vascular consult as above - hold asa given down-trending hgb - cont statin  # Hypertension Here bp low in setting of sepsis - hold home meds though will continue metoprolol  # T2DM Uncontrolled, history poor control likely made worse by sepsis - hold home oral meds - start semglee 10 qhs, SSI moderate  # Neuropathy - hold home gabapentin while altered   DVT prophylaxis: none Code Status: full  Family Communication: wife updated telephonically 1/30  Consults called: EDP Dr. Fuller PlanFunke says she has consulted podiatry and says she will consult vascular surgery   Level of care: Med-Surg Status is: Inpatient  Remains inpatient appropriate because: severity of illness        Silvano BilisNoah B Zacari Stiff MD [facility] Pager [phone]  If 7PM-7AM, please contact night-coverage [email or web address] Password [email or web address]  05/24/2021, 3:14 PM

## 2021-05-24 NOTE — ED Triage Notes (Signed)
Pt had wife call ambulance due to hyperglycemia 400-500's , right foot black, tyoes black, fever 101.5, rrate25,

## 2021-05-24 NOTE — Consult Note (Signed)
PODIATRY / FOOT AND ANKLE SURGERY CONSULTATION NOTE  Requesting Physician: Marjean Donna, MD  Reason for consult: L foot gangrene  Chief Complaint: L foot infection   HPI: Jose Schroeder is a 88 y.o. male with past medical history of uncontrolled diabetes, hypertension, prior right below-knee amputation, PVD who presented to [facility] due to altered mental status.  Patient was noted to have gangrenous changes to the left second toe and ray concerning for deep tissue infection.  Podiatry team was consulted for further evaluation.  Patient was also noted to have low hemoglobin so was getting a transfusion while being seen today.  Patient appears to be a rather poor historian overall but is responsive today and communication.  Patient has seen Dr. Vickki Muff in the past and patient was set to undergo an angiogram due to concerns for gangrene of the left second toe upcoming with Dr. Delana Meyer.  PMHx:  Past Medical History:  Diagnosis Date   Diabetes mellitus without complication ([place])    Hypertension    Peripheral vascular disease ([place])     Surgical Hx:  Past Surgical History:  Procedure Laterality Date   AMPUTATION TOE Right 01/15/2016   Procedure: AMPUTATION TOE;  Surgeon: Samara Deist, DPM;  Location: [facility] ORS;  Service: Podiatry;  Laterality: Right;   PERIPHERAL VASCULAR CATHETERIZATION Right 01/07/2016   Procedure: Abdominal Aortogram w/Lower Extremity;  Surgeon: Katha Cabal, MD;  Location: [place] [facility];  Service: Cardiovascular;  Laterality: Right;   TOE SURGERY     amputated 2 toes on L) foot    FHx:  Family History  Problem Relation Age of Onset   Diabetes Mother    Hypertension Mother    Diabetes Father    Hypertension Father    Diabetes Daughter     Social History:  reports that he has never smoked. He has never used smokeless tobacco. He reports that he does not drink alcohol and does not use drugs.  Allergies: No Known Allergies  (Not  in a hospital admission)   Physical Exam: General: Alert and oriented.  No apparent distress.  Vascular: DP/PT pulses left nonpalpable, no hair growth to digits, mild edema present to the left forefoot with minimal erythema.  Neuro: Light touch sensation absent to left foot and ankle.  Derm: Stable dry gangrene present to the left second toe that extends to the second metatarsal phalangeal joint, some dusky skin discoloration present to the plantar aspect the left second metatarsal phalangeal joint as well as the dorsal aspect of the left forefoot at the metatarsal phalangeal joint level.  Large blister type formation present to the plantar aspect of the second metatarsal phalangeal joint that extends up the flexor sheath area to the instep, no obvious palpable fluctuance present.       MSK: Right below-knee amputation, left partial hallux amputation, left fourth toe amputation.  Results for orders placed or performed during the hospital encounter of 05/24/21 (from the past 48 hour(s))  Resp Panel by RT-PCR (Flu A&B, Covid) Nasopharyngeal Swab     Status: None   Collection Time: 05/24/21 12:52 PM   Specimen: Nasopharyngeal Swab; Nasopharyngeal(NP) swabs in vial transport medium  Result Value Ref Range   SARS Coronavirus 2 by RT PCR NEGATIVE NEGATIVE    Comment: (NOTE) SARS-CoV-2 target nucleic acids are NOT DETECTED.  The SARS-CoV-2 RNA is generally detectable in upper respiratory specimens during the acute phase of infection. The lowest concentration of SARS-CoV-2 viral copies this assay can detect  is 138 copies/mL. A negative result does not preclude SARS-Cov-2 infection and should not be used as the sole basis for treatment or other patient management decisions. A negative result may occur with  improper specimen collection/handling, submission of specimen other than nasopharyngeal swab, presence of viral mutation(s) within the areas targeted by this assay, and inadequate  number of viral copies(<138 copies/mL). A negative result must be combined with clinical observations, patient history, and epidemiological information. The expected result is Negative.  Fact Sheet for Patients:  EntrepreneurPulse.com.au  Fact Sheet for Healthcare Providers:  IncredibleEmployment.be  This test is no t yet approved or cleared by the [place] FDA and  has been authorized for detection and/or diagnosis of SARS-CoV-2 by FDA under an Emergency Use Authorization (EUA). This EUA will remain  in effect (meaning this test can be used) for the duration of the COVID-19 declaration under Section 564(b)(1) of the Act, 21 U.S.C.section 360bbb-3(b)(1), unless the authorization is terminated  or revoked sooner.       Influenza A by PCR NEGATIVE NEGATIVE   Influenza B by PCR NEGATIVE NEGATIVE    Comment: (NOTE) The Xpert Xpress SARS-CoV-2/FLU/RSV plus assay is intended as an aid in the diagnosis of influenza from Nasopharyngeal swab specimens and should not be used as a sole basis for treatment. Nasal washings and aspirates are unacceptable for Xpert Xpress SARS-CoV-2/FLU/RSV testing.  Fact Sheet for Patients: EntrepreneurPulse.com.au  Fact Sheet for Healthcare Providers: IncredibleEmployment.be  This test is not yet approved or cleared by the [place] FDA and has been authorized for detection and/or diagnosis of SARS-CoV-2 by FDA under an Emergency Use Authorization (EUA). This EUA will remain in effect (meaning this test can be used) for the duration of the COVID-19 declaration under Section 564(b)(1) of the Act, 21 U.S.C. section 360bbb-3(b)(1), unless the authorization is terminated or revoked.  Performed at [facility], [address] [place], [place] [address]   Lactic acid, plasma     Status: None   Collection Time: 05/24/21 12:52 PM  Result Value Ref Range   Lactic  Acid, Venous 1.5 0.5 - 1.9 mmol/L    Comment: Performed at [facility], [address] [place], [place] [address]  Comprehensive metabolic panel     Status: Abnormal   Collection Time: 05/24/21 12:52 PM  Result Value Ref Range   Sodium 133 (L) 135 - 145 mmol/L   Potassium 4.5 3.5 - 5.1 mmol/L   Chloride 94 (L) 98 - 111 mmol/L   CO2 29 22 - 32 mmol/L   Glucose, Bld 399 (H) 70 - 99 mg/dL    Comment: Glucose reference range applies only to samples taken after fasting for at least 8 hours.   BUN 27 (H) 8 - 23 mg/dL   Creatinine, Ser 1.18 0.61 - 1.24 mg/dL   Calcium 9.2 8.9 - 10.3 mg/dL   Total Protein 7.9 6.5 - 8.1 g/dL   Albumin 3.4 (L) 3.5 - 5.0 g/dL   AST 41 15 - 41 U/L   ALT 26 0 - 44 U/L   Alkaline Phosphatase 64 38 - 126 U/L   Total Bilirubin 0.9 0.3 - 1.2 mg/dL   GFR, Estimated >60 >60 mL/min    Comment: (NOTE) Calculated using the CKD-EPI Creatinine Equation (2021)    Anion gap 10 5 - 15    Comment: Performed at [facility], [address], [place], [place] [address]  CBC WITH DIFFERENTIAL     Status: Abnormal   Collection  Time: 05/24/21 12:52 PM  Result Value Ref Range   WBC 23.5 (H) 4.0 - 10.5 K/uL   RBC 2.23 (L) 4.22 - 5.81 MIL/uL   Hemoglobin 6.5 (L) 13.0 - 17.0 g/dL   HCT 20.6 (L) 39.0 - 52.0 %   MCV 92.4 80.0 - 100.0 fL   MCH 29.1 26.0 - 34.0 pg   MCHC 31.6 30.0 - 36.0 g/dL   RDW 13.2 11.5 - 15.5 %   Platelets 510 (H) 150 - 400 K/uL   nRBC 0.0 0.0 - 0.2 %   Neutrophils Relative % 86 %   Neutro Abs 20.1 (H) 1.7 - 7.7 K/uL   Lymphocytes Relative 8 %   Lymphs Abs 1.8 0.7 - 4.0 K/uL   Monocytes Relative 5 %   Monocytes Absolute 1.2 (H) 0.1 - 1.0 K/uL   Eosinophils Relative 0 %   Eosinophils Absolute 0.0 0.0 - 0.5 K/uL   Basophils Relative 0 %   Basophils Absolute 0.0 0.0 - 0.1 K/uL   Immature Granulocytes 1 %   Abs Immature Granulocytes 0.23 (H) 0.00 - 0.07 K/uL    Comment: Performed at [facility], [address]  [place], [place] [address]  Protime-INR     Status: None   Collection Time: 05/24/21 12:52 PM  Result Value Ref Range   Prothrombin Time 15.0 11.4 - 15.2 seconds   INR 1.2 0.8 - 1.2    Comment: (NOTE) INR goal varies based on device and disease states. Performed at [facility], [address] [place], [place] [address]   APTT     Status: Abnormal   Collection Time: 05/24/21 12:52 PM  Result Value Ref Range   aPTT 38 (H) 24 - 36 seconds    Comment:        IF BASELINE aPTT IS ELEVATED, SUGGEST PATIENT RISK ASSESSMENT BE USED TO DETERMINE APPROPRIATE ANTICOAGULANT THERAPY. Performed at [facility], [address] [place], [place] [address]   ABO/Rh     Status: None   Collection Time: 05/24/21 12:52 PM  Result Value Ref Range   ABO/RH(D)      A POS Performed at [facility], [address] [place], [place] [address]   Iron and TIBC     Status: Abnormal   Collection Time: 05/24/21  1:20 PM  Result Value Ref Range   Iron 18 (L) 45 - 182 ug/dL   TIBC 167 (L) 250 - 450 ug/dL   Saturation Ratios 11 (L) 17.9 - 39.5 %   UIBC 149 ug/dL    Comment: Performed at [facility], [address] [place], [place] [address]  Ferritin (Iron Binding Protein)     Status: Abnormal   Collection Time: 05/24/21  1:20 PM  Result Value Ref Range   Ferritin 643 (H) 24 - 336 ng/mL    Comment: Performed at [facility], [address] [place], [place] [address]  Type and screen [place]     Status: None (Preliminary result)   Collection Time: 05/24/21  2:43 PM  Result Value Ref Range   ABO/RH(D) PENDING    Antibody Screen PENDING    Sample Expiration      05/27/2021,2359 Performed at [facility], [address] [place], [place] [address]   Prepare RBC (crossmatch)     Status: None   Collection Time: 05/24/21  2:50 PM  Result Value Ref Range   Order Confirmation      ORDER PROCESSED BY  BLOOD BANK Performed at  [facility], [address] [place], [place] [address]   Type and screen     Status: None (Preliminary result)   Collection Time: 05/24/21  3:38 PM  Result Value Ref Range   ABO/RH(D) A POS    Antibody Screen NEG    Sample Expiration 05/27/2021,2359    Unit Number [id number]    Blood Component Type RED CELLS,LR    Unit division 00    Status of Unit ISSUED    Transfusion Status OK TO TRANSFUSE    Crossmatch Result      Compatible Performed at [facility], [address] [place], [place] [address]    CT HEAD WO CONTRAST (5MM)  Result Date: 05/24/2021 CLINICAL DATA:  Head and neck trauma.  Hyperglycemia.  Fever. EXAM: CT HEAD WITHOUT CONTRAST CT CERVICAL SPINE WITHOUT CONTRAST TECHNIQUE: Multidetector CT imaging of the head and cervical spine was performed following the standard protocol without intravenous contrast. Multiplanar CT image reconstructions of the cervical spine were also generated. RADIATION DOSE REDUCTION: This exam was performed according to the departmental dose-optimization program which includes automated exposure control, adjustment of the mA and/or kV according to patient size and/or use of iterative reconstruction technique. COMPARISON:  Head CT 02/10/2021 FINDINGS: CT HEAD FINDINGS Brain: There is no evidence of an acute infarct, intracranial hemorrhage, mass, midline shift, or extra-axial fluid collection. Generalized cerebral atrophy is mild for age. Hypodensities in the cerebral white matter bilaterally are unchanged and nonspecific but compatible with mild-to-moderate chronic small vessel ischemic disease. Vascular: Calcified atherosclerosis at the skull base. No hyperdense vessel. Skull: No acute fracture or suspicious osseous lesion. Sinuses/Orbits: Visualized paranasal sinuses and mastoid air cells are clear. Bilateral cataract extraction. Other: None. CT CERVICAL SPINE FINDINGS Alignment: Straightening of the normal cervical lordosis. Trace  retrolisthesis of C5 on C6 and C6 on C7. Skull base and vertebrae: No acute fracture or suspicious osseous lesion. Soft tissues and spinal canal: No prevertebral fluid or swelling. No visible canal hematoma. Disc levels: Moderately advanced disc degeneration from C3-4 to C6-7. Moderate multilevel facet arthrosis in the cervical and included upper thoracic spine. Partial interbody and bilateral facet ankylosis at C4-5. Moderate multilevel neural foraminal stenosis due to uncovertebral and facet spurring. Multilevel spinal stenosis, moderate at C3-4. Upper chest: Biapical pleuroparenchymal lung scarring. Other: Prominent calcific atherosclerosis at the carotid bifurcations. IMPRESSION: 1. No evidence of acute intracranial abnormality. 2. Mild-to-moderate chronic small vessel ischemic disease. 3. No evidence of acute cervical spine fracture. Electronically Signed   By: Logan Bores M.D.   On: 05/24/2021 13:46   CT Cervical Spine Wo Contrast  Result Date: 05/24/2021 CLINICAL DATA:  Head and neck trauma.  Hyperglycemia.  Fever. EXAM: CT HEAD WITHOUT CONTRAST CT CERVICAL SPINE WITHOUT CONTRAST TECHNIQUE: Multidetector CT imaging of the head and cervical spine was performed following the standard protocol without intravenous contrast. Multiplanar CT image reconstructions of the cervical spine were also generated. RADIATION DOSE REDUCTION: This exam was performed according to the departmental dose-optimization program which includes automated exposure control, adjustment of the mA and/or kV according to patient size and/or use of iterative reconstruction technique. COMPARISON:  Head CT 02/10/2021 FINDINGS: CT HEAD FINDINGS Brain: There is no evidence of an acute infarct, intracranial hemorrhage, mass, midline shift, or extra-axial fluid collection. Generalized cerebral atrophy is mild for age. Hypodensities in the cerebral white matter bilaterally are unchanged and nonspecific but compatible with mild-to-moderate  chronic small vessel ischemic disease. Vascular: Calcified atherosclerosis at  the skull base. No hyperdense vessel. Skull: No acute fracture or suspicious osseous lesion. Sinuses/Orbits: Visualized paranasal sinuses and mastoid air cells are clear. Bilateral cataract extraction. Other: None. CT CERVICAL SPINE FINDINGS Alignment: Straightening of the normal cervical lordosis. Trace retrolisthesis of C5 on C6 and C6 on C7. Skull base and vertebrae: No acute fracture or suspicious osseous lesion. Soft tissues and spinal canal: No prevertebral fluid or swelling. No visible canal hematoma. Disc levels: Moderately advanced disc degeneration from C3-4 to C6-7. Moderate multilevel facet arthrosis in the cervical and included upper thoracic spine. Partial interbody and bilateral facet ankylosis at C4-5. Moderate multilevel neural foraminal stenosis due to uncovertebral and facet spurring. Multilevel spinal stenosis, moderate at C3-4. Upper chest: Biapical pleuroparenchymal lung scarring. Other: Prominent calcific atherosclerosis at the carotid bifurcations. IMPRESSION: 1. No evidence of acute intracranial abnormality. 2. Mild-to-moderate chronic small vessel ischemic disease. 3. No evidence of acute cervical spine fracture. Electronically Signed   By: Logan Bores M.D.   On: 05/24/2021 13:46   DG Chest Port 1 View  Result Date: 05/24/2021 CLINICAL DATA:  Hyperglycemia. Right foot discoloration with fever. Questionable sepsis. EXAM: PORTABLE CHEST 1 VIEW COMPARISON:  Radiographs 05/15/2021 and 02/10/2021. FINDINGS: 1348 hours. Low lung volumes. The heart size and mediastinal contours are normal. The lungs are clear. There is no pleural effusion or pneumothorax. No acute osseous findings are identified. Telemetry leads overlie the chest. IMPRESSION: No evidence of active infection in the chest. Electronically Signed   By: Richardean Sale M.D.   On: 05/24/2021 13:58   DG Foot Complete Left  Result Date:  05/24/2021 CLINICAL DATA:  Questionable sepsis.  Hyperglycemia.  Black toes. EXAM: LEFT FOOT - COMPLETE 3+ VIEW COMPARISON:  None. FINDINGS: Bones are diffusely osteopenic. Status post first ray amputation at the level of the PIP joint. Fourth ray amputation is identified at the level of the base of the proximal phalanx. Plantar subluxation of the second and third D IP joints identified. No definite fracture or dislocation identified. No focal bone erosions noted. There is diffuse subcutaneous gas overlying the second middle and distal phalanx. IMPRESSION: 1. No acute bone abnormality. 2. Diffuse subcutaneous gas overlying the second middle and distal phalanx. Cannot rule out soft tissue infection. Consider further evaluation with contrast enhanced MRI of the foot. Electronically Signed   By: Kerby Moors M.D.   On: 05/24/2021 14:01    Blood pressure 119/62, pulse 87, temperature 98.3 F (36.8 C), temperature source Oral, resp. rate 17, height 5\' 10"  (1.778 m), weight 65 kg, SpO2 100 %.   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 -X-ray imaging reviewed and discussed with patient in detail.  Potentially small amount of gas present in the tissue at the left second toe, recommend clinical correlation. -No obvious fluctuance present today to the left second metatarsal phalangeal joint or plantar foot, no corresponding erythema present to the area of the gangrene, mild edema.  Concern the patient does have some dusky discoloration present to the base the second toe and second metatarsal phalange joint as well as some changes over the dorsal aspect of the rest of the forefoot.  Patient also has some skin slough present to the plantar aspect of the left foot and blister type formation. -Skin sloughing  blister type formation removed to the plantar aspect of the foot which  revealed some macerated tissue.  No obvious fluctuance present upon examination.  No gas noted in the tissues based on today's exploration of wounds. -MRI ordered of the left foot without contrast for further assessment of deep tissue infection. -Patient is at grave risk for limb loss due to apparent poor blood flow to the left forefoot.  If patient can have blood flow improved to left forefoot by vascular and they believe that it is likely that patient's foot can undergo a transmetatarsal amputation then would be willing to perform this for patient.  If they believe the circulation is too poor to heal a transmetatarsal amputation then would recommend more proximal amputation by vascular surgery.  Discussed with Dr. Delana Meyer.  Plan for CTA at some point tomorrow. -Recommend Betadine wet-to-dry dressings to be placed daily. -Wound culture ordered.  Appreciate medicine recommendations for antibiotic therapy.   Caroline More, DPM 05/24/2021, 5:34 PM

## 2021-05-24 NOTE — ED Notes (Signed)
Patient transported to MRI; now returned to  [place]

## 2021-05-24 NOTE — ED Notes (Signed)
This RN was alerted by MRI technician that pt was at end of bed and had pulled out his IV and blood was leaking.  When I entered the room, the pt was at the end of the bed and there was blood from his transfusion leaking from IV catheter onto the floor.  Blood transfusion was stopped, pt was helped to head of the bed and assisted with clean up by myself and Melissa RN. Erie Noe RN notified IPMD of situtation. IV team consult ordered to recover access. Blood transfusion restarted on pre-established rt antecubital line. Pt stated that he did not know what happened, but in NAD.  Beth NT sitting 1:1 with pt at this time. Est loss of blood from blood bag 10-56ml. IPMD aware.

## 2021-05-25 ENCOUNTER — Encounter
Admission: EM | Disposition: A | Discharge status: Skilled Nursing Facility | Payer: Self-pay | Source: Home / Self Care | Attending: Internal Medicine

## 2021-05-25 DIAGNOSIS — E1152 Type 2 diabetes mellitus with diabetic peripheral angiopathy with gangrene: Secondary | ICD-10-CM

## 2021-05-25 DIAGNOSIS — I96 Gangrene, not elsewhere classified: Secondary | ICD-10-CM

## 2021-05-25 DIAGNOSIS — I70262 Atherosclerosis of native arteries of extremities with gangrene, left leg: Secondary | ICD-10-CM

## 2021-05-25 DIAGNOSIS — Z794 Long term (current) use of insulin: Secondary | ICD-10-CM

## 2021-05-25 HISTORY — PX: LOWER EXTREMITY ANGIOGRAPHY: CATH118251

## 2021-05-25 LAB — FERRITIN: Ferritin: 706 ng/mL — ABNORMAL HIGH (ref 24–336)

## 2021-05-25 LAB — BPAM RBC
Blood Product Expiration Date: 202302222359
ISSUE DATE / TIME: 202301301720
Unit Type and Rh: 6200

## 2021-05-25 LAB — CREATININE, SERUM
Creatinine, Ser: 0.76 mg/dL (ref 0.61–1.24)
GFR, Estimated: >60 mL/min (ref >60)

## 2021-05-25 LAB — TYPE AND SCREEN
ABO/RH(D): A POS
Antibody Screen: NEGATIVE
Unit division: 0

## 2021-05-25 LAB — CBG MONITORING, ED
Glucose-Capillary: 118 mg/dL — ABNORMAL HIGH (ref 70–99)
Glucose-Capillary: 147 mg/dL — ABNORMAL HIGH (ref 70–99)

## 2021-05-25 LAB — PATHOLOGIST SMEAR REVIEW

## 2021-05-25 LAB — GLUCOSE, CAPILLARY
Glucose-Capillary: 88 mg/dL (ref 70–99)
Glucose-Capillary: 228 mg/dL — ABNORMAL HIGH (ref 70–99)

## 2021-05-25 LAB — VITAMIN B12: Vitamin B-12: >7500 pg/mL — ABNORMAL HIGH (ref 180–914)

## 2021-05-25 LAB — LACTIC ACID, PLASMA: Lactic Acid, Venous: 1.4 mmol/L (ref 0.5–1.9)

## 2021-05-25 SURGERY — LOWER EXTREMITY ANGIOGRAPHY
Anesthesia: Moderate Sedation | Laterality: Left

## 2021-05-25 MED ORDER — SODIUM CHLORIDE 0.9 % IV SOLN: INTRAVENOUS | Status: AC

## 2021-05-25 MED ORDER — SODIUM CHLORIDE 0.9 % IV SOLN: 250.0000 mL | INTRAVENOUS | Status: DC | PRN

## 2021-05-25 MED ORDER — ACETAMINOPHEN 325 MG PO TABS: 650.0000 mg | ORAL_TABLET | ORAL | Status: DC | PRN

## 2021-05-25 MED ORDER — OXYCODONE HCL 5 MG PO TABS
5.0000 mg | ORAL_TABLET | ORAL | Status: DC | PRN
  Administered 2021-05-29 – 2021-05-30 (×2): 5 mg via ORAL
  Administered 2021-05-30 – 2021-05-31 (×3): 10 mg via ORAL
  Administered 2021-05-31 – 2021-06-01 (×2): 5 mg via ORAL
  Administered 2021-06-02 – 2021-06-06 (×5): 10 mg via ORAL
  Filled 2021-05-25: qty 1
  Filled 2021-05-25 (×2): qty 2
  Filled 2021-05-25: qty 1
  Filled 2021-05-25 (×4): qty 2
  Filled 2021-05-25: qty 1
  Filled 2021-05-25 (×2): qty 2
  Filled 2021-05-25: qty 1

## 2021-05-25 MED ORDER — FENTANYL CITRATE (PF) 100 MCG/2ML IJ SOLN
INTRAMUSCULAR | Status: DC | PRN
  Administered 2021-05-25 (×3): 12.5 ug via INTRAVENOUS

## 2021-05-25 MED ORDER — CLOPIDOGREL BISULFATE 75 MG PO TABS
75.0000 mg | ORAL_TABLET | Freq: Every day | ORAL | Status: DC
  Administered 2021-05-26 – 2021-06-07 (×12): 75 mg via ORAL
  Filled 2021-05-25 (×12): qty 1

## 2021-05-25 MED ORDER — MIDAZOLAM HCL 2 MG/2ML IJ SOLN
INTRAMUSCULAR | Status: DC | PRN
  Administered 2021-05-25: .5 mg via INTRAVENOUS
  Administered 2021-05-25: 1 mg via INTRAVENOUS
  Administered 2021-05-25: .5 mg via INTRAVENOUS

## 2021-05-25 MED ORDER — HEPARIN SODIUM (PORCINE) 1000 UNIT/ML IJ SOLN
INTRAMUSCULAR | Status: AC
  Filled 2021-05-25: qty 10

## 2021-05-25 MED ORDER — IODIXANOL 320 MG/ML IV SOLN
INTRAVENOUS | Status: DC | PRN
  Administered 2021-05-25: 50 mL via INTRA_ARTERIAL

## 2021-05-25 MED ORDER — SODIUM CHLORIDE 0.9% FLUSH
3.0000 mL | Freq: Two times a day (BID) | INTRAVENOUS | Status: DC
  Administered 2021-05-26 – 2021-06-06 (×12): 3 mL via INTRAVENOUS

## 2021-05-25 MED ORDER — HEPARIN SODIUM (PORCINE) 1000 UNIT/ML IJ SOLN
INTRAMUSCULAR | Status: DC | PRN
  Administered 2021-05-25: 5000 [IU] via INTRAVENOUS

## 2021-05-25 MED ORDER — FENTANYL CITRATE PF 50 MCG/ML IJ SOSY
PREFILLED_SYRINGE | INTRAMUSCULAR | Status: AC
  Filled 2021-05-25: qty 2

## 2021-05-25 MED ORDER — HYDRALAZINE HCL 20 MG/ML IJ SOLN: 5.0000 mg | INTRAMUSCULAR | Status: DC | PRN

## 2021-05-25 MED ORDER — POLYSACCHARIDE IRON COMPLEX 150 MG PO CAPS
150.0000 mg | ORAL_CAPSULE | Freq: Every day | ORAL | Status: DC
  Administered 2021-05-26 – 2021-06-07 (×12): 150 mg via ORAL
  Filled 2021-05-25 (×14): qty 1

## 2021-05-25 MED ORDER — SODIUM CHLORIDE 0.9% FLUSH: 3.0000 mL | INTRAVENOUS | Status: DC | PRN

## 2021-05-25 MED ORDER — ONDANSETRON HCL 4 MG/2ML IJ SOLN
4.0000 mg | Freq: Four times a day (QID) | INTRAMUSCULAR | Status: DC | PRN
  Administered 2021-06-01: 4 mg via INTRAVENOUS

## 2021-05-25 MED ORDER — MIDAZOLAM HCL 5 MG/5ML IJ SOLN
INTRAMUSCULAR | Status: AC
  Filled 2021-05-25: qty 5

## 2021-05-25 MED ORDER — VANCOMYCIN HCL 1500 MG/300ML IV SOLN
1500.0000 mg | INTRAVENOUS | Status: DC
  Administered 2021-05-26 – 2021-05-28 (×4): 1500 mg via INTRAVENOUS
  Filled 2021-05-25 (×4): qty 300

## 2021-05-25 MED ORDER — LABETALOL HCL 5 MG/ML IV SOLN: 10.0000 mg | INTRAVENOUS | Status: DC | PRN

## 2021-05-25 MED ORDER — CYANOCOBALAMIN 1000 MCG/ML IJ SOLN
1000.0000 ug | Freq: Once | INTRAMUSCULAR | Status: AC
  Administered 2021-05-25: 1000 ug via INTRAMUSCULAR
  Filled 2021-05-25: qty 1

## 2021-05-25 MED ORDER — MORPHINE SULFATE (PF) 4 MG/ML IV SOLN
2.0000 mg | INTRAVENOUS | Status: DC | PRN
  Administered 2021-06-02 (×2): 2 mg via INTRAVENOUS
  Filled 2021-05-25 (×4): qty 1

## 2021-05-25 SURGICAL SUPPLY — 20 items
BALLN LUTONIX 5X150X130 (BALLOONS) ×2
BALLN LUTONIX DCB 6X40X130 (BALLOONS) ×2
BALLOON LUTONIX 5X150X130 (BALLOONS) IMPLANT
BALLOON LUTONIX DCB 6X40X130 (BALLOONS) IMPLANT
CANNULA 5F STIFF (CANNULA) ×1 IMPLANT
CATH ANGIO 5F PIGTAIL 65CM (CATHETERS) ×1 IMPLANT
CATH VERT 5FR 125CM (CATHETERS) ×1 IMPLANT
COVER PROBE U/S 5X48 (MISCELLANEOUS) ×1 IMPLANT
DEVICE SAFEGUARD 24CM (GAUZE/BANDAGES/DRESSINGS) ×2 IMPLANT
DEVICE STARCLOSE SE CLOSURE (Vascular Products) ×1 IMPLANT
GLIDEWIRE ADV .035X260CM (WIRE) ×1 IMPLANT
KIT ENCORE 26 ADVANTAGE (KITS) ×1 IMPLANT
PACK ANGIOGRAPHY (CUSTOM PROCEDURE TRAY) ×2 IMPLANT
SHEATH ANL2 6FRX45 HC (SHEATH) ×1 IMPLANT
SHEATH BRITE TIP 5FRX11 (SHEATH) ×1 IMPLANT
STENT LIFESTENT 5F 6X150X135 (Permanent Stent) ×1 IMPLANT
STENT LIFESTENT 5F 7X40X135 (Permanent Stent) ×1 IMPLANT
SYR MEDRAD MARK 7 150ML (SYRINGE) ×1 IMPLANT
TUBING CONTRAST HIGH PRESS 72 (TUBING) ×1 IMPLANT
WIRE GUIDERIGHT .035X150 (WIRE) ×1 IMPLANT

## 2021-05-25 NOTE — Consult Note (Signed)
Pharmacy Antibiotic Note  Jose Schroeder is a 88 y.o. male admitted on 05/24/2021 with  diabetic foot infection .  Pharmacy has been consulted for Vancomycin and Zosyn dosing.  Plan: Zosyn 3.375g IV q8h (4 hour infusion).  2.Vancomycin  LD of 1500 mg Initiate Vancomycin 1000 mg Q24H. Goal AUC 400-600 Estimated AUC 552/Cmin: 14.5 Scr 1.18, TBW, Vd 0.72   New Dose change based on Scr changes  Vancomycin 1500 mg IV Q 24 hrs. Goal AUC 400-550. Expected AUC: 523 SCr used: 0.8 (actual 0.76)   Height: 5\' 10"  (177.8 cm) Weight: 65 kg (143 lb 4.8 oz) IBW/kg (Calculated) : 73  Temp (24hrs), Avg:98 F (36.7 C), Min:97 F (36.1 C), Max:98.9 F (37.2 C)  Recent Labs  Lab 05/24/21 1252 05/25/21 1532  WBC 23.5*  --   CREATININE 1.18 0.76  LATICACIDVEN 1.5 1.4     Estimated Creatinine Clearance: 60.9 mL/min (by C-G formula based on SCr of 0.76 mg/dL).    No Known Allergies  Antimicrobials this admission: 1/30 Zosyn >>  1/30 Vancomycin >>   Dose adjustments this admission:   Microbiology results: 1/30 BCx: NG 1/30 UCx: sent    Thank you for allowing pharmacy to be a part of this patients care.  Alok Minshall Rodriguez-Guzman PharmD, BCPS 05/25/2021 5:13 PM

## 2021-05-25 NOTE — Progress Notes (Signed)
Patient status post lower extremity angiogram today.  Still somewhat confused today.  Evaluation of the foot reveals a gangrenous left second toe with area of dark discoloration plantar to the second metatarsal head.  The forefoot is still cool to palpate at this time.  It is warm to the level of the metatarsal region.  No palpable pulse at this time to the dorsalis pedis or posterior tibial.  Will discuss with vascular surgery in regards to recommendations for limb salvage.  If there is enough flow we can give consideration to transmetatarsal amputation.  He still at a very high risk of undergoing amputation of the lower extremity if this fails.

## 2021-05-25 NOTE — ED Notes (Signed)
Pt to Specials at this time. VSS.

## 2021-05-25 NOTE — ED Notes (Signed)
Pt resting with eyes closed. Respirations equal and non labored at this time. Will continue to monitor

## 2021-05-25 NOTE — Op Note (Signed)
Jose Schroeder  Percutaneous Study/Intervention Procedural Note   Date of Surgery: 05/25/2021  Surgeon:  Katha Cabal, MD.  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  Introduction catheter into left lower extremity 3rd order catheter placement              2.    Contrast injection left lower extremity for distal runoff             3.  Percutaneous transluminal angioplasty and stent placement left superficial femoral and popliteal arteries.              4.  Star close closure right common femoral arteriotomy  Anesthesia: Conscious sedation was administered under my direct supervision by the interventional radiology RN. IV Versed plus fentanyl were utilized. Continuous ECG, pulse oximetry and blood pressure was monitored throughout the entire procedure.  Conscious sedation was for a total of 49 minutes and 6 seconds.  Sheath: 6 [place] Ansell right common femoral retrograde  Contrast: 50 cc  Fluoroscopy Time: 6.3 minutes  Indications:  Jose Schroeder presents with gangrene of the left foot.  He has known atherosclerotic occlusive disease.  Plans have been for angiography with the hope for intervention for limb salvage.  He presented to the [facility] emergency room sooner than his scheduled date and therefore has been added on today to expedite his treatment.  The risks and benefits are reviewed all questions answered patient agrees to proceed.  Procedure:  Jose Schroeder is a 88 y.o. y.o. male who was identified and appropriate procedural time out was performed.  The patient was then placed supine on the table and prepped and draped in the usual sterile fashion.    Ultrasound was placed in the sterile sleeve and the right groin was evaluated the right common femoral artery was  echolucent and pulsatile indicating patency.  Image was recorded for the permanent record and under real-time visualization a microneedle was inserted into the common femoral artery microwire followed by a micro-sheath.  A J-wire was then advanced through the micro-sheath and a  5 [place] sheath was then inserted over a J-wire. J-wire was then advanced and a 5 French pigtail catheter was positioned at the level of T12. AP projection of the aorta was then obtained. Pigtail catheter was repositioned to above the bifurcation and a RAO view of the pelvis was obtained.  Subsequently a pigtail catheter with the stiff angle Glidewire was used to cross the aortic bifurcation the catheter wire were advanced down into the left distal external iliac artery. Oblique view of the femoral bifurcation was then obtained and subsequently the wire was reintroduced and the pigtail catheter negotiated into the SFA representing third order catheter placement. Distal runoff was then performed.  Diagnostic interpretation: The abdominal aorta is opacified with a bolus injection contrast.  Single renal arteries noted bilaterally no evidence of renal artery stenosis.  The infrarenal aorta and aortic bifurcation is widely patent.  Bilateral common, internal and external iliac arteries are all widely patent.  The left common carotid and profunda femoris demonstrate diffuse calcific disease but there are no hemodynamically significant stenoses.  The left superficial femoral artery is patent at its origin approximately 8 to 10 cm distal to the origin there is a focal 20 to 30 mm long 70%  stenosis.  The midportion is calcified but no hemodynamically significant stenosis is noted.  At the level of Hunter's canal there is a marked increase in the atherosclerotic occlusive disease with greater than 80% stenosis throughout a 150 mm long lesion that extends down to the mid popliteal.  Essentially the end of this lesion is at the level of the tibial  plateau.  Below this the popliteal is widely patent.  The trifurcation is heavily diseased with occlusion of the anterior tibial at its origin and extending throughout its course there is poor visualization of the dorsalis pedis.  Tibioperoneal trunk is diseased but there are no hemodynamically significant stenoses in the peroneal was the single-vessel runoff to the foot which collateralizes at the level of the ankle.  Posterior tibial is occluded at its origin and remains occluded throughout its entire course and there is poor filling of the lateral and medial plantar arteries as well.  5000 units of heparin was then given and allowed to circulate and a 6 [place] Ansell sheath was advanced up and over the bifurcation and positioned in the femoral artery  KMP  catheter and stiff angle Glidewire were then negotiated down into the distal popliteal.  Hand-injection contrast was then used to verify intraluminal positioning in the tibioperoneal trunk. The wire was then reintroduced and a 7 mm x 40 mm life stent is deployed across the proximal SFA lesion.  This stent is then postdilated with a 6 mm x 40 mm Lutonix balloon was used to angioplasty the superficial femoral artery.  Follow-up imaging demonstrated less than 10% residual stenosis and attention was turned to the Hunter's canal lesion where a 6 mm x 150 mm life stent is deployed beginning at the level of the tibial plateau and extending proximally.  It was then postdilated with a 5 mm x 150 mm Lutonix drug-eluting balloon inflation was to 12 atm for 1 minute.  Follow-up imaging demonstrated patency with less than 10% residual stenosis. Distal runoff was then reassessed and found to be unchanged.  After review of these images the sheath is pulled into the right external iliac oblique of the common femoral is obtained and a Star close device deployed. There no immediate complications.   Findings:   The abdominal aorta is opacified with a bolus injection  contrast.  Single renal arteries noted bilaterally no evidence of renal artery stenosis.  The infrarenal aorta and aortic bifurcation is widely patent.  Bilateral common, internal and external iliac arteries are all widely patent.  The left common carotid and profunda femoris demonstrate diffuse calcific disease but there are no hemodynamically significant stenoses.  The left superficial femoral artery is patent at its origin approximately 8 to 10 cm distal to the origin there is a focal 20 to 30 mm long 70% stenosis.  The midportion is calcified but no hemodynamically significant stenosis is noted.  At the level of Hunter's canal there is a marked increase in the atherosclerotic occlusive disease with greater than 80% stenosis throughout a 150 mm long lesion that extends down to the mid popliteal.  Essentially the end of this lesion is at the level of the tibial plateau.  Below this the popliteal is widely patent.  The trifurcation is heavily diseased with occlusion of the anterior tibial at its origin and extending throughout its course there is poor visualization of the dorsalis pedis.  Tibioperoneal trunk is diseased but there are no hemodynamically significant stenoses in the peroneal was the single-vessel runoff to the foot  which collateralizes at the level of the ankle.  Posterior tibial is occluded at its origin and remains occluded throughout its entire course and there is poor filling of the lateral and medial plantar arteries as well.  Following angioplasty and stent placement the SFA in 2 locations the distal of which extends to the mid popliteal is now patent with in-line flow and looks quite nice and there is less than 10% residual stenosis at both locations.     Summary: Successful recanalization left lower extremity for limb salvage                        Disposition: Patient was taken to the recovery room in stable condition having tolerated the procedure well.  Jose Schroeder, Jose Schroeder 05/25/2021,2:37 PM

## 2021-05-25 NOTE — ED Notes (Signed)
Pt resting in bed with eyes closed Call light within reach. Will continue to monitor for changes. Pt is calm and not pulling at his medical devices.

## 2021-05-25 NOTE — Interval H&P Note (Signed)
History and Physical Interval Note:  05/25/2021 2:36 PM  Jose Schroeder  has presented today for surgery, with the diagnosis of atherosclerosis with gangrene.  The various methods of treatment have been discussed with the patient and family. After consideration of risks, benefits and other options for treatment, the patient has consented to  Procedure(s): Lower Extremity Angiography (Left) as a surgical intervention.  The patient's history has been reviewed, patient examined, no change in status, stable for surgery.  I have reviewed the patient's chart and labs.  Questions were answered to the patient's satisfaction.     Hortencia Pilar

## 2021-05-25 NOTE — H&P (View-Only) (Signed)
 @LOGO @   MRN : [id number]  Jose Schroeder is a 88 y.o. (03-Mar-1933) male who presents with chief complaint of gangrene of the left foot.  History of Present Illness:   I am asked to evaluate the patient by Dr. Fuller PlanFunke.  The patient is an 88 year old gentleman known to our service who presented to [facility] yesterday with altered mental status.  Per the family the patient had become increasingly confused.  On evaluation he is noted to have gangrenous changes to his left foot.  I was contacted by the emergency room physician and asked to evaluate.  He was last seen in my office on January 18.  The patient has a history of PAD with a previous right below-knee amputation.  The patient has dementia and the daughter provides much of the history.   The patient notes the ulcer has been present for multiple weeks and has not been improving.  It is very painful and has had some drainage.  No specific history of trauma noted by the patient.  The patient denies fever or chills.  The patient does have diabetes which has been difficult to control.   The daughter noted that he has been experiencing pain consistent with rest pain.  Today noninvasive studies show an ABI of 0.63 on the left.  He has a TBI of 0.  There are monophasic tibial artery waveforms with flat toe waveforms on the left.    Current Meds  Medication Sig   acetaminophen (TYLENOL) 325 MG tablet Take 2 tablets (650 mg total) by mouth every 6 (six) hours as needed for mild pain (or Fever >/= 101).   amLODipine (NORVASC) 10 MG tablet Take 1 tablet (10 mg total) by mouth daily.   atorvastatin (LIPITOR) 20 MG tablet Take 1 tablet (20 mg total) by mouth daily.   ferrous sulfate 325 (65 FE) MG tablet Take 1 tablet (325 mg total) by mouth daily with breakfast.   gabapentin (NEURONTIN) 100 MG capsule TAKE 1 CAPSULE BY MOUTH 3 TIMES  DAILY   glipiZIDE (GLUCOTROL XL) 10 MG 24 hr tablet Take 1 tablet (10 mg total) by mouth  daily with breakfast.   hydrochlorothiazide (HYDRODIURIL) 25 MG tablet Take 1 tablet (25 mg total) by mouth daily.   lisinopril (ZESTRIL) 10 MG tablet Take 1 tablet (10 mg total) by mouth daily.   metFORMIN (GLUCOPHAGE) 500 MG tablet TAKE 1 TABLET BY MOUTH  TWICE DAILY   metoprolol tartrate (LOPRESSOR) 50 MG tablet Take 1 tablet (50 mg total) by mouth 2 (two) times daily.   sitaGLIPtin (JANUVIA) 100 MG tablet Take 1 tablet (100 mg total) by mouth daily.    Past Medical History:  Diagnosis Date   Diabetes mellitus without complication (HCC)    Hypertension    Peripheral vascular disease (HCC)     Past Surgical History:  Procedure Laterality Date   AMPUTATION TOE Right 01/15/2016   Procedure: AMPUTATION TOE;  Surgeon: Gwyneth RevelsJustin Fowler, DPM;  Location: [facility] ORS;  Service: Podiatry;  Laterality: Right;   PERIPHERAL VASCULAR CATHETERIZATION Right 01/07/2016   Procedure: Abdominal Aortogram w/Lower Extremity;  Surgeon: Renford DillsGregory G Saharsh Sterling, MD;  Location: [facility] INVASIVE CV LAB;  Service: Cardiovascular;  Laterality: Right;   TOE SURGERY     amputated 2 toes on L) foot    Social History Social History   Tobacco Use   Smoking status: Never   Smokeless tobacco: Never   Tobacco comments:    Smoking cessation materials not required  Vaping Use   Vaping Use: Never used  Substance Use Topics   Alcohol use: No    Alcohol/week: 0.0 standard drinks   Drug use: No    Family History Family History  Problem Relation Age of Onset   Diabetes Mother    Hypertension Mother    Diabetes Father    Hypertension Father    Diabetes Daughter     No Known Allergies   REVIEW OF SYSTEMS (Negative unless checked)  Constitutional: [] Weight loss  [] Fever  [] Chills Cardiac: [] Chest pain   [] Chest pressure   [] Palpitations   [] Shortness of breath when laying flat   [] Shortness of breath with exertion. Vascular:  [x] Pain in legs with walking   [x] Pain in legs at rest  [] History of DVT   [] Phlebitis    [] Swelling in legs   [] Varicose veins   [x] Non-healing ulcers Pulmonary:   [] Uses home oxygen   [] Productive cough   [] Hemoptysis   [] Wheeze  [] COPD   [] Asthma Neurologic:  [] Dizziness   [] Seizures   [] History of stroke   [] History of TIA  [] Aphasia   [] Vissual changes   [] Weakness or numbness in arm   [] Weakness or numbness in leg Musculoskeletal:   [] Joint swelling   [] Joint pain   [] Low back pain Hematologic:  [] Easy bruising  [] Easy bleeding   [] Hypercoagulable state   [] Anemic Gastrointestinal:  [] Diarrhea   [] Vomiting  [] Gastroesophageal reflux/heartburn   [] Difficulty swallowing. Genitourinary:  [] Chronic kidney disease   [] Difficult urination  [] Frequent urination   [] Blood in urine Skin:  [] Rashes   [] Ulcers  Psychological:  [] History of anxiety   []  History of major depression.  Physical Examination  Vitals:   05/25/21 0621 05/25/21 0715 05/25/21 0919 05/25/21 1045  BP: 123/60 (!) 105/52 120/84 (!) 111/55  Pulse: 73 73 89 69  Resp: 18 18  12   Temp:      TempSrc:      SpO2: 100% 100%  100%  Weight:      Height:       Body mass index is 20.56 kg/m. Gen: WD/WN, NAD Head: [place]/AT, No temporalis wasting.  Ear/Nose/Throat: Hearing grossly intact, nares w/o erythema or drainage Eyes: PER, EOMI, sclera nonicteric.  Neck: Supple, no masses.  No bruit or JVD.  Pulmonary:  Good air movement, no audible wheezing, no use of accessory muscles.  Cardiac: RRR, normal S1, S2, no Murmurs. Vascular: Gangrenous changes to the left foot Vessel Right Left  Radial Palpable Palpable  PT Not palpable Not palpable  DP Not palpable Not palpable  Gastrointestinal: soft, non-distended. No guarding/no peritoneal signs.  Musculoskeletal: M/S 5/5 throughout.  No visible deformity.  Neurologic: CN 2-12 intact. Pain and light touch intact in extremities.  Symmetrical.  Speech is fluent. Motor exam as listed above. Psychiatric: Judgment intact, Mood & affect appropriate for pt's clinical  situation. Dermatologic: No rashes + ulcers noted.  No changes consistent with cellulitis.   CBC Lab Results  Component Value Date   WBC 23.5 (H) 05/24/2021   HGB 6.5 (L) 05/24/2021   HCT 20.6 (L) 05/24/2021   MCV 92.4 05/24/2021   PLT 510 (H) 05/24/2021    BMET    Component Value Date/Time   NA 133 (L) 05/24/2021 1252   NA 140 02/19/2021 1538   NA 141 11/12/2012 0717   K 4.5 05/24/2021 1252   K 4.0 11/12/2012 0717   CL 94 (L) 05/24/2021 1252   CL 106 11/12/2012 0717   CO2 29 05/24/2021 1252  CO2 31 11/12/2012 0717   GLUCOSE 399 (H) 05/24/2021 1252   GLUCOSE 149 (H) 11/12/2012 0717   BUN 27 (H) 05/24/2021 1252   BUN 9 02/19/2021 1538   BUN 10 11/12/2012 0717   CREATININE 1.18 05/24/2021 1252   CREATININE 0.99 11/12/2012 0717   CALCIUM 9.2 05/24/2021 1252   CALCIUM 9.4 11/12/2012 0717   GFRNONAA >60 05/24/2021 1252   GFRNONAA >60 11/12/2012 0717   GFRAA 90 10/01/2019 1550   GFRAA >60 11/12/2012 0717   Estimated Creatinine Clearance: 41.3 mL/min (by C-G formula based on SCr of 1.18 mg/dL).  COAG Lab Results  Component Value Date   INR 1.2 05/24/2021   INR 1.2 05/13/2021   INR 1.4 (H) 02/11/2021    Radiology DG Chest 2 View  Result Date: 05/13/2021 CLINICAL DATA:  Sepsis EXAM: CHEST - 2 VIEW COMPARISON:  None. FINDINGS: The cardiac silhouette, mediastinal and hilar contours are within normal limits. The lungs are clear. No pulmonary edema, pleural effusions or infiltrates. No pulmonary lesions. No pneumothorax. The bony thorax is intact. IMPRESSION: No acute cardiopulmonary findings. Electronically Signed   By: Rudie Meyer M.D.   On: 05/13/2021 13:52   CT HEAD WO CONTRAST ( )  Result Date: 05/24/2021 CLINICAL DATA:  Head and neck trauma.  Hyperglycemia.  Fever. EXAM: CT HEAD WITHOUT CONTRAST CT CERVICAL SPINE WITHOUT CONTRAST TECHNIQUE: Multidetector CT imaging of the head and cervical spine was performed following the standard protocol without intravenous  contrast. Multiplanar CT image reconstructions of the cervical spine were also generated. RADIATION DOSE REDUCTION: This exam was performed according to the departmental dose-optimization program which includes automated exposure control, adjustment of the mA and/or kV according to patient size and/or use of iterative reconstruction technique. COMPARISON:  Head CT 02/10/2021 FINDINGS: CT HEAD FINDINGS Brain: There is no evidence of an acute infarct, intracranial hemorrhage, mass, midline shift, or extra-axial fluid collection. Generalized cerebral atrophy is mild for age. Hypodensities in the cerebral white matter bilaterally are unchanged and nonspecific but compatible with mild-to-moderate chronic small vessel ischemic disease. Vascular: Calcified atherosclerosis at the skull base. No hyperdense vessel. Skull: No acute fracture or suspicious osseous lesion. Sinuses/Orbits: Visualized paranasal sinuses and mastoid air cells are clear. Bilateral cataract extraction. Other: None. CT CERVICAL SPINE FINDINGS Alignment: Straightening of the normal cervical lordosis. Trace retrolisthesis of C5 on C6 and C6 on C7. Skull base and vertebrae: No acute fracture or suspicious osseous lesion. Soft tissues and spinal canal: No prevertebral fluid or swelling. No visible canal hematoma. Disc levels: Moderately advanced disc degeneration from C3-4 to C6-7. Moderate multilevel facet arthrosis in the cervical and included upper thoracic spine. Partial interbody and bilateral facet ankylosis at C4-5. Moderate multilevel neural foraminal stenosis due to uncovertebral and facet spurring. Multilevel spinal stenosis, moderate at C3-4. Upper chest: Biapical pleuroparenchymal lung scarring. Other: Prominent calcific atherosclerosis at the carotid bifurcations. IMPRESSION: 1. No evidence of acute intracranial abnormality. 2. Mild-to-moderate chronic small vessel ischemic disease. 3. No evidence of acute cervical spine fracture.  Electronically Signed   By: Sebastian Ache M.D.   On: 05/24/2021 13:46   CT Cervical Spine Wo Contrast  Result Date: 05/24/2021 CLINICAL DATA:  Head and neck trauma.  Hyperglycemia.  Fever. EXAM: CT HEAD WITHOUT CONTRAST CT CERVICAL SPINE WITHOUT CONTRAST TECHNIQUE: Multidetector CT imaging of the head and cervical spine was performed following the standard protocol without intravenous contrast. Multiplanar CT image reconstructions of the cervical spine were also generated. RADIATION DOSE REDUCTION: This exam was performed according  to the departmental dose-optimization program which includes automated exposure control, adjustment of the mA and/or kV according to patient size and/or use of iterative reconstruction technique. COMPARISON:  Head CT 02/10/2021 FINDINGS: CT HEAD FINDINGS Brain: There is no evidence of an acute infarct, intracranial hemorrhage, mass, midline shift, or extra-axial fluid collection. Generalized cerebral atrophy is mild for age. Hypodensities in the cerebral white matter bilaterally are unchanged and nonspecific but compatible with mild-to-moderate chronic small vessel ischemic disease. Vascular: Calcified atherosclerosis at the skull base. No hyperdense vessel. Skull: No acute fracture or suspicious osseous lesion. Sinuses/Orbits: Visualized paranasal sinuses and mastoid air cells are clear. Bilateral cataract extraction. Other: None. CT CERVICAL SPINE FINDINGS Alignment: Straightening of the normal cervical lordosis. Trace retrolisthesis of C5 on C6 and C6 on C7. Skull base and vertebrae: No acute fracture or suspicious osseous lesion. Soft tissues and spinal canal: No prevertebral fluid or swelling. No visible canal hematoma. Disc levels: Moderately advanced disc degeneration from C3-4 to C6-7. Moderate multilevel facet arthrosis in the cervical and included upper thoracic spine. Partial interbody and bilateral facet ankylosis at C4-5. Moderate multilevel neural foraminal stenosis due  to uncovertebral and facet spurring. Multilevel spinal stenosis, moderate at C3-4. Upper chest: Biapical pleuroparenchymal lung scarring. Other: Prominent calcific atherosclerosis at the carotid bifurcations. IMPRESSION: 1. No evidence of acute intracranial abnormality. 2. Mild-to-moderate chronic small vessel ischemic disease. 3. No evidence of acute cervical spine fracture. Electronically Signed   By: Sebastian Ache M.D.   On: 05/24/2021 13:46   MR FOOT LEFT WO CONTRAST  Result Date: 05/24/2021 CLINICAL DATA:  Foot swelling, down buttock, osteomyelitis suspected. EXAM: MRI OF THE LEFT FOOT WITHOUT CONTRAST TECHNIQUE: Multiplanar, multisequence MR imaging of the left forefoot was performed. No intravenous contrast was administered. COMPARISON:  Radiographs dated May 24, 2021 FINDINGS: Bones/Joint/Cartilage Postsurgical changes with amputation at the proximal interphalangeal joint of the first digit. Fourth ray amputation at the level of the base of the proximal phalanx. Bone marrow edema of the second metatarsal shaft which in the presence of adjacent edema and phlegmonous changes with multiple foci of gas is highly suspicious for acute osteomyelitis. There is also heterogeneously increased signal of the third metatarsal shaft, also concerning for osteomyelitis. Ligaments Collateral ligaments are intact.  Lisfranc ligament is intact. Muscles and Tendons Flexor, peroneal and extensor compartment tendons are intact. 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 Abnormal intramuscular signal of the plantar muscles about the second digit concerning for phlegmonous changes. Soft tissue No fluid collection or hematoma.  No soft tissue mass. IMPRESSION: 1. Abnormal marrow signal of the second and third metatarsals, which in the presence of adjacent phlegmonous changes about the dorsal and plantar aspect of the foot and marked subcutaneous soft tissue edema is  consistent with acute osteomyelitis. 2. Postsurgical changes with prior amputation of the first and fourth digits as described above. Electronically Signed   By: Larose Hires D.O.   On: 05/24/2021 21:08   DG Chest Port 1 View  Result Date: 05/24/2021 CLINICAL DATA:  Hyperglycemia. Right foot discoloration with fever. Questionable sepsis. EXAM: PORTABLE CHEST 1 VIEW COMPARISON:  Radiographs 05/15/2021 and 02/10/2021. FINDINGS: 1348 hours. Low lung volumes. The heart size and mediastinal contours are normal. The lungs are clear. There is no pleural effusion or pneumothorax. No acute osseous findings are identified. Telemetry leads overlie the chest. IMPRESSION: No evidence of active infection in the chest. Electronically Signed   By: Carey Bullocks M.D.   On:  05/24/2021 13:58   DG Chest Portable 1 View  Result Date: 05/15/2021 CLINICAL DATA:  Confusion, elevated white blood cell count EXAM: PORTABLE CHEST 1 VIEW COMPARISON:  02/10/2021 FINDINGS: Heart and mediastinal contours are within normal limits. No focal opacities or effusions. No acute bony abnormality. IMPRESSION: No active disease. Electronically Signed   By: Charlett NoseKevin  Dover M.D.   On: 05/15/2021 00:40   DG Foot Complete Left  Result Date: 05/24/2021 CLINICAL DATA:  Questionable sepsis.  Hyperglycemia.  Black toes. EXAM: LEFT FOOT - COMPLETE 3+ VIEW COMPARISON:  None. FINDINGS: Bones are diffusely osteopenic. Status post first ray amputation at the level of the PIP joint. Fourth ray amputation is identified at the level of the base of the proximal phalanx. Plantar subluxation of the second and third D IP joints identified. No definite fracture or dislocation identified. No focal bone erosions noted. There is diffuse subcutaneous gas overlying the second middle and distal phalanx. IMPRESSION: 1. No acute bone abnormality. 2. Diffuse subcutaneous gas overlying the second middle and distal phalanx. Cannot rule out soft tissue infection. Consider  further evaluation with contrast enhanced MRI of the foot. Electronically Signed   By: Signa Kellaylor  Stroud M.D.   On: 05/24/2021 14:01   VAS US ABI WITH/WO TBI  Result Date: 05/13/2021  LOWER EXTREMITY DOPPLER STUDY Patient Name:  Rogelia RohrerKelep R Kotz  Date of Exam:   05/12/2021 Medical Rec #: [id number]            Accession #:    [id number] Date of Birth: 04-Sep-1932            Patient Gender: M Patient Age:   [age] years Exam Location:  [facility] Procedure:      VAS US ABI WITH/WO TBI Referring Phys: Levora DredgeGregory Kit Mollett --------------------------------------------------------------------------------  Indications: Peripheral artery disease, and right BKA.  Comparison Study: 04/23/2020 Performing Technologist: Debbe BalesSolomon Mcclary RVS  Examination Guidelines: A complete evaluation includes at minimum, Doppler waveform signals and systolic blood pressure reading at the level of bilateral brachial, anterior tibial, and posterior tibial arteries, when vessel segments are accessible. Bilateral testing is considered an integral part of a complete examination. Photoelectric Plethysmograph (PPG) waveforms and toe systolic pressure readings are included as required and additional duplex testing as needed. Limited examinations for reoccurring indications may be performed as noted.  ABI Findings: +--------+------------------+-----+--------+--------+  Right    Rt Pressure (mmHg) Index Waveform Comment   +--------+------------------+-----+--------+--------+  Brachial 129                                         +--------+------------------+-----+--------+--------+  ATA                                        Rt BKA    +--------+------------------+-----+--------+--------+ +---------+------------------+-----+----------+-------+  Left      Lt Pressure (mmHg) Index Waveform   Comment  +---------+------------------+-----+----------+-------+  Brachial  135                                           +---------+------------------+-----+----------+-------+  ATA       85  monophasic .63      +---------+------------------+-----+----------+-------+  PTA       84                 0.62  monophasic          +---------+------------------+-----+----------+-------+  Great Toe 0                  0.00  Absent              +---------+------------------+-----+----------+-------+ +-------+-----------+-----------+------------+------------+  ABI/TBI Today's ABI Today's TBI Previous ABI Previous TBI  +-------+-----------+-----------+------------+------------+  Right   Rt BKA                                             +-------+-----------+-----------+------------+------------+  Left    .63         0           1.10         .74           +-------+-----------+-----------+------------+------------+ Left ABIs and TBIs appear decreased compared to prior study on 04/23/2020.  Summary: Right: Rt BKA. Left: Resting left ankle-brachial index indicates moderate left lower extremity arterial disease. The left toe-brachial index is abnormal.  *See table(s) above for measurements and observations.  Electronically signed by [name] MD on 05/13/2021 at 4:28:02 PM.    Final      Assessment/Plan 1. Atherosclerosis of native arteries of the extremities with ulceration (HCC)  Recommend:   The patient has evidence of severe atherosclerotic changes of the the left lower extremity associated with ulceration and tissue loss of the foot.  This represents a limb threatening ischemia and places the patient at the risk for limb loss.   Patient should undergo angiography of the left lower extremity with the hope for intervention for limb salvage.  The risks and benefits as well as the alternative therapies was discussed in detail with the patient.  All questions were answered.  Patient agrees to proceed with left leg angiography.   The patient will follow up with me in the office after discharge.     2. Type 2 diabetes  mellitus with diabetic peripheral angiopathy and gangrene, with long-term current use of insulin (HCC) Continue hypoglycemic medications as already ordered, these medications have been reviewed and there are no changes at this time.   Hgb A1C to be monitored as already arranged by primary service    3. Essential hypertension Continue antihypertensive medications as already ordered, these medications have been reviewed and there are no changes at this time.    4. Familial hypercholesterolemia Continue statin as ordered and reviewed, no changes at this time    [name], MD  05/25/2021 11:42 AM

## 2021-05-25 NOTE — Consult Note (Signed)
 @LOGO @   MRN : [id number]  Jose Schroeder is a 88 y.o. (03-Mar-1933) male who presents with chief complaint of gangrene of the left foot.  History of Present Illness:   I am asked to evaluate the patient by Dr. Fuller PlanFunke.  The patient is an 88 year old gentleman known to our service who presented to [facility] yesterday with altered mental status.  Per the family the patient had become increasingly confused.  On evaluation he is noted to have gangrenous changes to his left foot.  I was contacted by the emergency room physician and asked to evaluate.  He was last seen in my office on January 18.  The patient has a history of PAD with a previous right below-knee amputation.  The patient has dementia and the daughter provides much of the history.   The patient notes the ulcer has been present for multiple weeks and has not been improving.  It is very painful and has had some drainage.  No specific history of trauma noted by the patient.  The patient denies fever or chills.  The patient does have diabetes which has been difficult to control.   The daughter noted that he has been experiencing pain consistent with rest pain.  Today noninvasive studies show an ABI of 0.63 on the left.  He has a TBI of 0.  There are monophasic tibial artery waveforms with flat toe waveforms on the left.    Current Meds  Medication Sig   acetaminophen (TYLENOL) 325 MG tablet Take 2 tablets (650 mg total) by mouth every 6 (six) hours as needed for mild pain (or Fever >/= 101).   amLODipine (NORVASC) 10 MG tablet Take 1 tablet (10 mg total) by mouth daily.   atorvastatin (LIPITOR) 20 MG tablet Take 1 tablet (20 mg total) by mouth daily.   ferrous sulfate 325 (65 FE) MG tablet Take 1 tablet (325 mg total) by mouth daily with breakfast.   gabapentin (NEURONTIN) 100 MG capsule TAKE 1 CAPSULE BY MOUTH 3 TIMES  DAILY   glipiZIDE (GLUCOTROL XL) 10 MG 24 hr tablet Take 1 tablet (10 mg total) by mouth  daily with breakfast.   hydrochlorothiazide (HYDRODIURIL) 25 MG tablet Take 1 tablet (25 mg total) by mouth daily.   lisinopril (ZESTRIL) 10 MG tablet Take 1 tablet (10 mg total) by mouth daily.   metFORMIN (GLUCOPHAGE) 500 MG tablet TAKE 1 TABLET BY MOUTH  TWICE DAILY   metoprolol tartrate (LOPRESSOR) 50 MG tablet Take 1 tablet (50 mg total) by mouth 2 (two) times daily.   sitaGLIPtin (JANUVIA) 100 MG tablet Take 1 tablet (100 mg total) by mouth daily.    Past Medical History:  Diagnosis Date   Diabetes mellitus without complication (HCC)    Hypertension    Peripheral vascular disease (HCC)     Past Surgical History:  Procedure Laterality Date   AMPUTATION TOE Right 01/15/2016   Procedure: AMPUTATION TOE;  Surgeon: Gwyneth RevelsJustin Fowler, DPM;  Location: [facility] ORS;  Service: Podiatry;  Laterality: Right;   PERIPHERAL VASCULAR CATHETERIZATION Right 01/07/2016   Procedure: Abdominal Aortogram w/Lower Extremity;  Surgeon: Renford DillsGregory G Jeanetta Alonzo, MD;  Location: [facility] INVASIVE CV LAB;  Service: Cardiovascular;  Laterality: Right;   TOE SURGERY     amputated 2 toes on L) foot    Social History Social History   Tobacco Use   Smoking status: Never   Smokeless tobacco: Never   Tobacco comments:    Smoking cessation materials not required  Vaping Use   Vaping Use: Never used  Substance Use Topics   Alcohol use: No    Alcohol/week: 0.0 standard drinks   Drug use: No    Family History Family History  Problem Relation Age of Onset   Diabetes Mother    Hypertension Mother    Diabetes Father    Hypertension Father    Diabetes Daughter     No Known Allergies   REVIEW OF SYSTEMS (Negative unless checked)  Constitutional: [] Weight loss  [] Fever  [] Chills Cardiac: [] Chest pain   [] Chest pressure   [] Palpitations   [] Shortness of breath when laying flat   [] Shortness of breath with exertion. Vascular:  [x] Pain in legs with walking   [x] Pain in legs at rest  [] History of DVT   [] Phlebitis    [] Swelling in legs   [] Varicose veins   [x] Non-healing ulcers Pulmonary:   [] Uses home oxygen   [] Productive cough   [] Hemoptysis   [] Wheeze  [] COPD   [] Asthma Neurologic:  [] Dizziness   [] Seizures   [] History of stroke   [] History of TIA  [] Aphasia   [] Vissual changes   [] Weakness or numbness in arm   [] Weakness or numbness in leg Musculoskeletal:   [] Joint swelling   [] Joint pain   [] Low back pain Hematologic:  [] Easy bruising  [] Easy bleeding   [] Hypercoagulable state   [] Anemic Gastrointestinal:  [] Diarrhea   [] Vomiting  [] Gastroesophageal reflux/heartburn   [] Difficulty swallowing. Genitourinary:  [] Chronic kidney disease   [] Difficult urination  [] Frequent urination   [] Blood in urine Skin:  [] Rashes   [] Ulcers  Psychological:  [] History of anxiety   []  History of major depression.  Physical Examination  Vitals:   05/25/21 0621 05/25/21 0715 05/25/21 0919 05/25/21 1045  BP: 123/60 (!) 105/52 120/84 (!) 111/55  Pulse: 73 73 89 69  Resp: 18 18  12   Temp:      TempSrc:      SpO2: 100% 100%  100%  Weight:      Height:       Body mass index is 20.56 kg/m. Gen: WD/WN, NAD Head: [place]/AT, No temporalis wasting.  Ear/Nose/Throat: Hearing grossly intact, nares w/o erythema or drainage Eyes: PER, EOMI, sclera nonicteric.  Neck: Supple, no masses.  No bruit or JVD.  Pulmonary:  Good air movement, no audible wheezing, no use of accessory muscles.  Cardiac: RRR, normal S1, S2, no Murmurs. Vascular: Gangrenous changes to the left foot Vessel Right Left  Radial Palpable Palpable  PT Not palpable Not palpable  DP Not palpable Not palpable  Gastrointestinal: soft, non-distended. No guarding/no peritoneal signs.  Musculoskeletal: M/S 5/5 throughout.  No visible deformity.  Neurologic: CN 2-12 intact. Pain and light touch intact in extremities.  Symmetrical.  Speech is fluent. Motor exam as listed above. Psychiatric: Judgment intact, Mood & affect appropriate for pt's clinical  situation. Dermatologic: No rashes + ulcers noted.  No changes consistent with cellulitis.   CBC Lab Results  Component Value Date   WBC 23.5 (H) 05/24/2021   HGB 6.5 (L) 05/24/2021   HCT 20.6 (L) 05/24/2021   MCV 92.4 05/24/2021   PLT 510 (H) 05/24/2021    BMET    Component Value Date/Time   NA 133 (L) 05/24/2021 1252   NA 140 02/19/2021 1538   NA 141 11/12/2012 0717   K 4.5 05/24/2021 1252   K 4.0 11/12/2012 0717   CL 94 (L) 05/24/2021 1252   CL 106 11/12/2012 0717   CO2 29 05/24/2021 1252  CO2 31 11/12/2012 0717   GLUCOSE 399 (H) 05/24/2021 1252   GLUCOSE 149 (H) 11/12/2012 0717   BUN 27 (H) 05/24/2021 1252   BUN 9 02/19/2021 1538   BUN 10 11/12/2012 0717   CREATININE 1.18 05/24/2021 1252   CREATININE 0.99 11/12/2012 0717   CALCIUM 9.2 05/24/2021 1252   CALCIUM 9.4 11/12/2012 0717   GFRNONAA >60 05/24/2021 1252   GFRNONAA >60 11/12/2012 0717   GFRAA 90 10/01/2019 1550   GFRAA >60 11/12/2012 0717   Estimated Creatinine Clearance: 41.3 mL/min (by C-G formula based on SCr of 1.18 mg/dL).  COAG Lab Results  Component Value Date   INR 1.2 05/24/2021   INR 1.2 05/13/2021   INR 1.4 (H) 02/11/2021    Radiology DG Chest 2 View  Result Date: 05/13/2021 CLINICAL DATA:  Sepsis EXAM: CHEST - 2 VIEW COMPARISON:  None. FINDINGS: The cardiac silhouette, mediastinal and hilar contours are within normal limits. The lungs are clear. No pulmonary edema, pleural effusions or infiltrates. No pulmonary lesions. No pneumothorax. The bony thorax is intact. IMPRESSION: No acute cardiopulmonary findings. Electronically Signed   By: Rudie Meyer M.D.   On: 05/13/2021 13:52   CT HEAD WO CONTRAST ( )  Result Date: 05/24/2021 CLINICAL DATA:  Head and neck trauma.  Hyperglycemia.  Fever. EXAM: CT HEAD WITHOUT CONTRAST CT CERVICAL SPINE WITHOUT CONTRAST TECHNIQUE: Multidetector CT imaging of the head and cervical spine was performed following the standard protocol without intravenous  contrast. Multiplanar CT image reconstructions of the cervical spine were also generated. RADIATION DOSE REDUCTION: This exam was performed according to the departmental dose-optimization program which includes automated exposure control, adjustment of the mA and/or kV according to patient size and/or use of iterative reconstruction technique. COMPARISON:  Head CT 02/10/2021 FINDINGS: CT HEAD FINDINGS Brain: There is no evidence of an acute infarct, intracranial hemorrhage, mass, midline shift, or extra-axial fluid collection. Generalized cerebral atrophy is mild for age. Hypodensities in the cerebral white matter bilaterally are unchanged and nonspecific but compatible with mild-to-moderate chronic small vessel ischemic disease. Vascular: Calcified atherosclerosis at the skull base. No hyperdense vessel. Skull: No acute fracture or suspicious osseous lesion. Sinuses/Orbits: Visualized paranasal sinuses and mastoid air cells are clear. Bilateral cataract extraction. Other: None. CT CERVICAL SPINE FINDINGS Alignment: Straightening of the normal cervical lordosis. Trace retrolisthesis of C5 on C6 and C6 on C7. Skull base and vertebrae: No acute fracture or suspicious osseous lesion. Soft tissues and spinal canal: No prevertebral fluid or swelling. No visible canal hematoma. Disc levels: Moderately advanced disc degeneration from C3-4 to C6-7. Moderate multilevel facet arthrosis in the cervical and included upper thoracic spine. Partial interbody and bilateral facet ankylosis at C4-5. Moderate multilevel neural foraminal stenosis due to uncovertebral and facet spurring. Multilevel spinal stenosis, moderate at C3-4. Upper chest: Biapical pleuroparenchymal lung scarring. Other: Prominent calcific atherosclerosis at the carotid bifurcations. IMPRESSION: 1. No evidence of acute intracranial abnormality. 2. Mild-to-moderate chronic small vessel ischemic disease. 3. No evidence of acute cervical spine fracture.  Electronically Signed   By: Sebastian Ache M.D.   On: 05/24/2021 13:46   CT Cervical Spine Wo Contrast  Result Date: 05/24/2021 CLINICAL DATA:  Head and neck trauma.  Hyperglycemia.  Fever. EXAM: CT HEAD WITHOUT CONTRAST CT CERVICAL SPINE WITHOUT CONTRAST TECHNIQUE: Multidetector CT imaging of the head and cervical spine was performed following the standard protocol without intravenous contrast. Multiplanar CT image reconstructions of the cervical spine were also generated. RADIATION DOSE REDUCTION: This exam was performed according  to the departmental dose-optimization program which includes automated exposure control, adjustment of the mA and/or kV according to patient size and/or use of iterative reconstruction technique. COMPARISON:  Head CT 02/10/2021 FINDINGS: CT HEAD FINDINGS Brain: There is no evidence of an acute infarct, intracranial hemorrhage, mass, midline shift, or extra-axial fluid collection. Generalized cerebral atrophy is mild for age. Hypodensities in the cerebral white matter bilaterally are unchanged and nonspecific but compatible with mild-to-moderate chronic small vessel ischemic disease. Vascular: Calcified atherosclerosis at the skull base. No hyperdense vessel. Skull: No acute fracture or suspicious osseous lesion. Sinuses/Orbits: Visualized paranasal sinuses and mastoid air cells are clear. Bilateral cataract extraction. Other: None. CT CERVICAL SPINE FINDINGS Alignment: Straightening of the normal cervical lordosis. Trace retrolisthesis of C5 on C6 and C6 on C7. Skull base and vertebrae: No acute fracture or suspicious osseous lesion. Soft tissues and spinal canal: No prevertebral fluid or swelling. No visible canal hematoma. Disc levels: Moderately advanced disc degeneration from C3-4 to C6-7. Moderate multilevel facet arthrosis in the cervical and included upper thoracic spine. Partial interbody and bilateral facet ankylosis at C4-5. Moderate multilevel neural foraminal stenosis due  to uncovertebral and facet spurring. Multilevel spinal stenosis, moderate at C3-4. Upper chest: Biapical pleuroparenchymal lung scarring. Other: Prominent calcific atherosclerosis at the carotid bifurcations. IMPRESSION: 1. No evidence of acute intracranial abnormality. 2. Mild-to-moderate chronic small vessel ischemic disease. 3. No evidence of acute cervical spine fracture. Electronically Signed   By: Sebastian Ache M.D.   On: 05/24/2021 13:46   MR FOOT LEFT WO CONTRAST  Result Date: 05/24/2021 CLINICAL DATA:  Foot swelling, down buttock, osteomyelitis suspected. EXAM: MRI OF THE LEFT FOOT WITHOUT CONTRAST TECHNIQUE: Multiplanar, multisequence MR imaging of the left forefoot was performed. No intravenous contrast was administered. COMPARISON:  Radiographs dated May 24, 2021 FINDINGS: Bones/Joint/Cartilage Postsurgical changes with amputation at the proximal interphalangeal joint of the first digit. Fourth ray amputation at the level of the base of the proximal phalanx. Bone marrow edema of the second metatarsal shaft which in the presence of adjacent edema and phlegmonous changes with multiple foci of gas is highly suspicious for acute osteomyelitis. There is also heterogeneously increased signal of the third metatarsal shaft, also concerning for osteomyelitis. Ligaments Collateral ligaments are intact.  Lisfranc ligament is intact. Muscles and Tendons Flexor, peroneal and extensor compartment tendons are intact. 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 Abnormal intramuscular signal of the plantar muscles about the second digit concerning for phlegmonous changes. Soft tissue No fluid collection or hematoma.  No soft tissue mass. IMPRESSION: 1. Abnormal marrow signal of the second and third metatarsals, which in the presence of adjacent phlegmonous changes about the dorsal and plantar aspect of the foot and marked subcutaneous soft tissue edema is  consistent with acute osteomyelitis. 2. Postsurgical changes with prior amputation of the first and fourth digits as described above. Electronically Signed   By: Larose Hires D.O.   On: 05/24/2021 21:08   DG Chest Port 1 View  Result Date: 05/24/2021 CLINICAL DATA:  Hyperglycemia. Right foot discoloration with fever. Questionable sepsis. EXAM: PORTABLE CHEST 1 VIEW COMPARISON:  Radiographs 05/15/2021 and 02/10/2021. FINDINGS: 1348 hours. Low lung volumes. The heart size and mediastinal contours are normal. The lungs are clear. There is no pleural effusion or pneumothorax. No acute osseous findings are identified. Telemetry leads overlie the chest. IMPRESSION: No evidence of active infection in the chest. Electronically Signed   By: Carey Bullocks M.D.   On:  05/24/2021 13:58   DG Chest Portable 1 View  Result Date: 05/15/2021 CLINICAL DATA:  Confusion, elevated white blood cell count EXAM: PORTABLE CHEST 1 VIEW COMPARISON:  02/10/2021 FINDINGS: Heart and mediastinal contours are within normal limits. No focal opacities or effusions. No acute bony abnormality. IMPRESSION: No active disease. Electronically Signed   By: Charlett NoseKevin  Dover M.D.   On: 05/15/2021 00:40   DG Foot Complete Left  Result Date: 05/24/2021 CLINICAL DATA:  Questionable sepsis.  Hyperglycemia.  Black toes. EXAM: LEFT FOOT - COMPLETE 3+ VIEW COMPARISON:  None. FINDINGS: Bones are diffusely osteopenic. Status post first ray amputation at the level of the PIP joint. Fourth ray amputation is identified at the level of the base of the proximal phalanx. Plantar subluxation of the second and third D IP joints identified. No definite fracture or dislocation identified. No focal bone erosions noted. There is diffuse subcutaneous gas overlying the second middle and distal phalanx. IMPRESSION: 1. No acute bone abnormality. 2. Diffuse subcutaneous gas overlying the second middle and distal phalanx. Cannot rule out soft tissue infection. Consider  further evaluation with contrast enhanced MRI of the foot. Electronically Signed   By: Signa Kellaylor  Stroud M.D.   On: 05/24/2021 14:01   VAS US ABI WITH/WO TBI  Result Date: 05/13/2021  LOWER EXTREMITY DOPPLER STUDY Patient Name:  Jose Schroeder  Date of Exam:   05/12/2021 Medical Rec #: [id number]            Accession #:    [id number] Date of Birth: 04-Sep-1932            Patient Gender: M Patient Age:   [age] years Exam Location:  [facility] Procedure:      VAS US ABI WITH/WO TBI Referring Phys: Levora DredgeGregory Brigitte Soderberg --------------------------------------------------------------------------------  Indications: Peripheral artery disease, and right BKA.  Comparison Study: 04/23/2020 Performing Technologist: Debbe BalesSolomon Mcclary RVS  Examination Guidelines: A complete evaluation includes at minimum, Doppler waveform signals and systolic blood pressure reading at the level of bilateral brachial, anterior tibial, and posterior tibial arteries, when vessel segments are accessible. Bilateral testing is considered an integral part of a complete examination. Photoelectric Plethysmograph (PPG) waveforms and toe systolic pressure readings are included as required and additional duplex testing as needed. Limited examinations for reoccurring indications may be performed as noted.  ABI Findings: +--------+------------------+-----+--------+--------+  Right    Rt Pressure (mmHg) Index Waveform Comment   +--------+------------------+-----+--------+--------+  Brachial 129                                         +--------+------------------+-----+--------+--------+  ATA                                        Rt BKA    +--------+------------------+-----+--------+--------+ +---------+------------------+-----+----------+-------+  Left      Lt Pressure (mmHg) Index Waveform   Comment  +---------+------------------+-----+----------+-------+  Brachial  135                                           +---------+------------------+-----+----------+-------+  ATA       85  monophasic .63      +---------+------------------+-----+----------+-------+  PTA       84                 0.62  monophasic          +---------+------------------+-----+----------+-------+  Great Toe 0                  0.00  Absent              +---------+------------------+-----+----------+-------+ +-------+-----------+-----------+------------+------------+  ABI/TBI Today's ABI Today's TBI Previous ABI Previous TBI  +-------+-----------+-----------+------------+------------+  Right   Rt BKA                                             +-------+-----------+-----------+------------+------------+  Left    .63         0           1.10         .74           +-------+-----------+-----------+------------+------------+ Left ABIs and TBIs appear decreased compared to prior study on 04/23/2020.  Summary: Right: Rt BKA. Left: Resting left ankle-brachial index indicates moderate left lower extremity arterial disease. The left toe-brachial index is abnormal.  *See table(s) above for measurements and observations.  Electronically signed by [name] MD on 05/13/2021 at 4:28:02 PM.    Final      Assessment/Plan 1. Atherosclerosis of native arteries of the extremities with ulceration (HCC)  Recommend:   The patient has evidence of severe atherosclerotic changes of the the left lower extremity associated with ulceration and tissue loss of the foot.  This represents a limb threatening ischemia and places the patient at the risk for limb loss.   Patient should undergo angiography of the left lower extremity with the hope for intervention for limb salvage.  The risks and benefits as well as the alternative therapies was discussed in detail with the patient.  All questions were answered.  Patient agrees to proceed with left leg angiography.   The patient will follow up with me in the office after discharge.     2. Type 2 diabetes  mellitus with diabetic peripheral angiopathy and gangrene, with long-term current use of insulin (HCC) Continue hypoglycemic medications as already ordered, these medications have been reviewed and there are no changes at this time.   Hgb A1C to be monitored as already arranged by primary service    3. Essential hypertension Continue antihypertensive medications as already ordered, these medications have been reviewed and there are no changes at this time.    4. Familial hypercholesterolemia Continue statin as ordered and reviewed, no changes at this time    [name], MD  05/25/2021 11:42 AM

## 2021-05-25 NOTE — Progress Notes (Signed)
PROGRESS NOTE    JAARON OLESON  VOJ:[id number] DOB: 1933-01-20 DOA: 05/24/2021 PCP: Duanne Limerick, MD    Brief Narrative:  Jose Schroeder is a 88 y.o. male with medical history significant for dementia, uncontrolled DM, hTN, PAD, right BKA, presenting with altered mental status.  Patient was diagnosed with a right foot infection with gangrene, sepsis.  He was started on antibiotics.  He is seen by podiatry, scheduled I&D.   Assessment & Plan:   Principal Problem:   Diabetic foot infection (HCC) Active Problems:   Diabetes mellitus (HCC)   Essential hypertension   Hx of BKA, left (HCC)   Toe gangrene (HCC)  Sepsis is secondary to diabetic foot infection. Right foot gangrene with diabetic foot infection. Acute metabolic cephalopathy secondary to infection. Baseline dementia. Patient has been seen by vascular surgery and podiatry.  Currently in surgery. Continue antibiotics with Zosyn and vancomycin.  Symptomatic anemia. Anemia of chronic disease. Iron studies showed decreased iron and elevated ferritin, consistent with anemia of chronic disease.  We will treat with oral iron. B12 level was 238 on 01/2021, borderline low.  We will check homocystine level.  Give a dose of B12 shot.  Peripheral arterial disease. Followed by vascular surgery.  Type 2 diabetes. Continue scheduled insulin and sliding scale insulin.  DVT prophylaxis: SCds Code Status: Full Family Communication:  Disposition Plan: Home   Status is: Inpatient Remains inpatient appropriate because: Severity of disease, IV antibiotics, procedure.  Planned Discharge Destination: Home          I/O last 3 completed shifts: In: 2833 [I.V.:3; Blood:380; IV Piggyback:2450] Out: -  No intake/output data recorded.     Consultants:  Podiatry and vascular surgery.  Procedures:   Antimicrobials: Zosyn and vancomycin  Subjective: Patient has some baseline confusion, no agitation. Denies  any short of breath or cough. No significant pain.  Objective: Vitals:   05/25/21 0919 05/25/21 1045 05/25/21 1230 05/25/21 1329  BP: 120/84 (!) 111/55 135/72 (!) 151/75  Pulse: 89 69 75 80  Resp:  12 14 17   Temp:    97.9 F (36.6 C)  TempSrc:    Oral  SpO2:  100% 100% 100%  Weight:      Height:        Intake/Output Summary (Last 24 hours) at 05/25/2021 1431 Last data filed at 05/24/2021 2203 Gross per 24 hour  Intake 2783 ml  Output --  Net 2783 ml   Filed Weights   05/24/21 1249  Weight: 65 kg    Examination:  General exam: Appears calm and comfortable  Respiratory system: Clear to auscultation. Respiratory effort normal. Cardiovascular system: S1 & S2 heard, RRR. No JVD, murmurs, rubs, gallops or clicks. No pedal edema. Gastrointestinal system: Abdomen is nondistended, soft and nontender. No organomegaly or masses felt. Normal bowel sounds heard. Central nervous system: Alert and oriented x2. No focal neurological deficits. Extremities: Left BKA, right toe gangrene Skin: No rashes, lesions or ulcers Psychiatry: Judgement and insight appear normal. Mood & affect appropriate.     Data Reviewed: I have personally reviewed following labs and imaging studies  CBC: Recent Labs  Lab 05/24/21 1252  WBC 23.5*  NEUTROABS 20.1*  HGB 6.5*  HCT 20.6*  MCV 92.4  PLT 510*   Basic Metabolic Panel: Recent Labs  Lab 05/24/21 1252  NA 133*  K 4.5  CL 94*  CO2 29  GLUCOSE 399*  BUN 27*  CREATININE 1.18  CALCIUM 9.2   GFR: Estimated Creatinine  Clearance: 41.3 mL/min (by C-G formula based on SCr of 1.18 mg/dL). Liver Function Tests: Recent Labs  Lab 05/24/21 1252  AST 41  ALT 26  ALKPHOS 64  BILITOT 0.9  PROT 7.9  ALBUMIN 3.4*   No results for input(s): LIPASE, AMYLASE in the last 168 hours. No results for input(s): AMMONIA in the last 168 hours. Coagulation Profile: Recent Labs  Lab 05/24/21 1252  INR 1.2   Cardiac Enzymes: No results for  input(s): CKTOTAL, CKMB, CKMBINDEX, TROPONINI in the last 168 hours. BNP (last 3 results) No results for input(s): PROBNP in the last 8760 hours. HbA1C: No results for input(s): HGBA1C in the last 72 hours. CBG: Recent Labs  Lab 05/24/21 2058 05/25/21 0735 05/25/21 1153  GLUCAP 152* 118* 147*   Lipid Profile: No results for input(s): CHOL, HDL, LDLCALC, TRIG, CHOLHDL, LDLDIRECT in the last 72 hours. Thyroid Function Tests: No results for input(s): TSH, T4TOTAL, FREET4, T3FREE, THYROIDAB in the last 72 hours. Anemia Panel: Recent Labs    05/24/21 1320  FERRITIN 643*  TIBC 167*  IRON 18*   Sepsis Labs: Recent Labs  Lab 05/24/21 1252  LATICACIDVEN 1.5    Recent Results (from the past 240 hour(s))  Resp Panel by RT-PCR (Flu A&B, Covid) Nasopharyngeal Swab     Status: None   Collection Time: 05/24/21 12:52 PM   Specimen: Nasopharyngeal Swab; Nasopharyngeal(NP) swabs in vial transport medium  Result Value Ref Range Status   SARS Coronavirus 2 by RT PCR NEGATIVE NEGATIVE Final    Comment: (NOTE) SARS-CoV-2 target nucleic acids are NOT DETECTED.  The SARS-CoV-2 RNA is generally detectable in upper respiratory specimens during the acute phase of infection. The lowest concentration of SARS-CoV-2 viral copies this assay can detect is 138 copies/mL. A negative result does not preclude SARS-Cov-2 infection and should not be used as the sole basis for treatment or other patient management decisions. A negative result may occur with  improper specimen collection/handling, submission of specimen other than nasopharyngeal swab, presence of viral mutation(s) within the areas targeted by this assay, and inadequate number of viral copies(<138 copies/mL). A negative result must be combined with clinical observations, patient history, and epidemiological information. The expected result is Negative.  Fact Sheet for Patients:  BloggerCourse.com  Fact Sheet  for Healthcare Providers:  SeriousBroker.it  This test is no t yet approved or cleared by the [place] FDA and  has been authorized for detection and/or diagnosis of SARS-CoV-2 by FDA under an Emergency Use Authorization (EUA). This EUA will remain  in effect (meaning this test can be used) for the duration of the COVID-19 declaration under Section 564(b)(1) of the Act, 21 U.S.C.section 360bbb-3(b)(1), unless the authorization is terminated  or revoked sooner.       Influenza A by PCR NEGATIVE NEGATIVE Final   Influenza B by PCR NEGATIVE NEGATIVE Final    Comment: (NOTE) The Xpert Xpress SARS-CoV-2/FLU/RSV plus assay is intended as an aid in the diagnosis of influenza from Nasopharyngeal swab specimens and should not be used as a sole basis for treatment. Nasal washings and aspirates are unacceptable for Xpert Xpress SARS-CoV-2/FLU/RSV testing.  Fact Sheet for Patients: BloggerCourse.com  Fact Sheet for Healthcare Providers: SeriousBroker.it  This test is not yet approved or cleared by the [place] FDA and has been authorized for detection and/or diagnosis of SARS-CoV-2 by FDA under an Emergency Use Authorization (EUA). This EUA will remain in effect (meaning this test can be used) for the duration of the  COVID-19 declaration under Section 564(b)(1) of the Act, 21 U.S.C. section 360bbb-3(b)(1), unless the authorization is terminated or revoked.  Performed at [facility], [address] [place], [place] [address]   Blood Culture (routine x 2)     Status: None (Preliminary result)   Collection Time: 05/24/21 12:52 PM   Specimen: BLOOD  Result Value Ref Range Status   Specimen Description BLOOD RIGHT ANTECUBITAL  Final   Special Requests   Final    BOTTLES DRAWN AEROBIC AND ANAEROBIC Blood Culture results may not be optimal due to an excessive volume of blood received in culture  bottles   Culture   Final    NO GROWTH < 24 HOURS Performed at [facility], [address] [place], [place] [address]    Report Status PENDING  Incomplete  Blood Culture (routine x 2)     Status: None (Preliminary result)   Collection Time: 05/24/21 12:52 PM   Specimen: BLOOD  Result Value Ref Range Status   Specimen Description BLOOD BLOOD RIGHT ARM  Final   Special Requests   Final    BOTTLES DRAWN AEROBIC AND ANAEROBIC Blood Culture adequate volume   Culture   Final    NO GROWTH < 24 HOURS Performed at [facility], [address] [place], [place] [address]    Report Status PENDING  Incomplete  Aerobic/Anaerobic Culture w Gram Stain (surgical/deep wound)     Status: None (Preliminary result)   Collection Time: 05/24/21  5:34 PM   Specimen: Wound  Result Value Ref Range Status   Specimen Description   Final    WOUND Performed at [facility], [address] [place], [place] [address]    Special Requests   Final    LEFT FOOT Performed at [facility], [address] [place], [place] [address]    Gram Stain   Final    NO SQUAMOUS EPITHELIAL CELLS SEEN FEW WBC SEEN FEW GRAM NEGATIVE RODS ABUNDANT GRAM POSITIVE COCCI    Culture   Final    ABUNDANT GRAM NEGATIVE RODS CULTURE REINCUBATED FOR BETTER GROWTH Performed at [facility], [address] [place], [place] [address]    Report Status PENDING  Incomplete         Radiology Studies: CT HEAD WO CONTRAST (5MM)  Result Date: 05/24/2021 CLINICAL DATA:  Head and neck trauma.  Hyperglycemia.  Fever. EXAM: CT HEAD WITHOUT CONTRAST CT CERVICAL SPINE WITHOUT CONTRAST TECHNIQUE: Multidetector CT imaging of the head and cervical spine was performed following the standard protocol without intravenous contrast. Multiplanar CT image reconstructions of the cervical spine were also generated. RADIATION DOSE REDUCTION: This exam was performed according to the departmental  dose-optimization program which includes automated exposure control, adjustment of the mA and/or kV according to patient size and/or use of iterative reconstruction technique. COMPARISON:  Head CT 02/10/2021 FINDINGS: CT HEAD FINDINGS Brain: There is no evidence of an acute infarct, intracranial hemorrhage, mass, midline shift, or extra-axial fluid collection. Generalized cerebral atrophy is mild for age. Hypodensities in the cerebral white matter bilaterally are unchanged and nonspecific but compatible with mild-to-moderate chronic small vessel ischemic disease. Vascular: Calcified atherosclerosis at the skull base. No hyperdense vessel. Skull: No acute fracture or suspicious osseous lesion. Sinuses/Orbits: Visualized paranasal sinuses and mastoid air cells are clear. Bilateral cataract extraction. Other: None. CT CERVICAL SPINE FINDINGS Alignment: Straightening of the normal cervical lordosis. Trace retrolisthesis of C5 on C6 and C6 on C7. Skull base and  vertebrae: No acute fracture or suspicious osseous lesion. Soft tissues and spinal canal: No prevertebral fluid or swelling. No visible canal hematoma. Disc levels: Moderately advanced disc degeneration from C3-4 to C6-7. Moderate multilevel facet arthrosis in the cervical and included upper thoracic spine. Partial interbody and bilateral facet ankylosis at C4-5. Moderate multilevel neural foraminal stenosis due to uncovertebral and facet spurring. Multilevel spinal stenosis, moderate at C3-4. Upper chest: Biapical pleuroparenchymal lung scarring. Other: Prominent calcific atherosclerosis at the carotid bifurcations. IMPRESSION: 1. No evidence of acute intracranial abnormality. 2. Mild-to-moderate chronic small vessel ischemic disease. 3. No evidence of acute cervical spine fracture. Electronically Signed   By: Sebastian Ache M.D.   On: 05/24/2021 13:46   CT Cervical Spine Wo Contrast  Result Date: 05/24/2021 CLINICAL DATA:  Head and neck trauma.  Hyperglycemia.   Fever. EXAM: CT HEAD WITHOUT CONTRAST CT CERVICAL SPINE WITHOUT CONTRAST TECHNIQUE: Multidetector CT imaging of the head and cervical spine was performed following the standard protocol without intravenous contrast. Multiplanar CT image reconstructions of the cervical spine were also generated. RADIATION DOSE REDUCTION: This exam was performed according to the departmental dose-optimization program which includes automated exposure control, adjustment of the mA and/or kV according to patient size and/or use of iterative reconstruction technique. COMPARISON:  Head CT 02/10/2021 FINDINGS: CT HEAD FINDINGS Brain: There is no evidence of an acute infarct, intracranial hemorrhage, mass, midline shift, or extra-axial fluid collection. Generalized cerebral atrophy is mild for age. Hypodensities in the cerebral white matter bilaterally are unchanged and nonspecific but compatible with mild-to-moderate chronic small vessel ischemic disease. Vascular: Calcified atherosclerosis at the skull base. No hyperdense vessel. Skull: No acute fracture or suspicious osseous lesion. Sinuses/Orbits: Visualized paranasal sinuses and mastoid air cells are clear. Bilateral cataract extraction. Other: None. CT CERVICAL SPINE FINDINGS Alignment: Straightening of the normal cervical lordosis. Trace retrolisthesis of C5 on C6 and C6 on C7. Skull base and vertebrae: No acute fracture or suspicious osseous lesion. Soft tissues and spinal canal: No prevertebral fluid or swelling. No visible canal hematoma. Disc levels: Moderately advanced disc degeneration from C3-4 to C6-7. Moderate multilevel facet arthrosis in the cervical and included upper thoracic spine. Partial interbody and bilateral facet ankylosis at C4-5. Moderate multilevel neural foraminal stenosis due to uncovertebral and facet spurring. Multilevel spinal stenosis, moderate at C3-4. Upper chest: Biapical pleuroparenchymal lung scarring. Other: Prominent calcific atherosclerosis at  the carotid bifurcations. IMPRESSION: 1. No evidence of acute intracranial abnormality. 2. Mild-to-moderate chronic small vessel ischemic disease. 3. No evidence of acute cervical spine fracture. Electronically Signed   By: Sebastian Ache M.D.   On: 05/24/2021 13:46   MR FOOT LEFT WO CONTRAST  Result Date: 05/24/2021 CLINICAL DATA:  Foot swelling, down buttock, osteomyelitis suspected. EXAM: MRI OF THE LEFT FOOT WITHOUT CONTRAST TECHNIQUE: Multiplanar, multisequence MR imaging of the left forefoot was performed. No intravenous contrast was administered. COMPARISON:  Radiographs dated May 24, 2021 FINDINGS: Bones/Joint/Cartilage Postsurgical changes with amputation at the proximal interphalangeal joint of the first digit. Fourth ray amputation at the level of the base of the proximal phalanx. Bone marrow edema of the second metatarsal shaft which in the presence of adjacent edema and phlegmonous changes with multiple foci of gas is highly suspicious for acute osteomyelitis. There is also heterogeneously increased signal of the third metatarsal shaft, also concerning for osteomyelitis. Ligaments Collateral ligaments are intact.  Lisfranc ligament is intact. Muscles and Tendons Flexor, peroneal and extensor compartment tendons are intact. 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 Abnormal intramuscular signal of the plantar muscles about the second digit concerning for phlegmonous changes. Soft tissue No fluid collection or hematoma.  No soft tissue mass. IMPRESSION: 1. Abnormal marrow signal of the second and third metatarsals, which in the presence of adjacent phlegmonous changes about the dorsal and plantar aspect of the foot and marked subcutaneous soft tissue edema is consistent with acute osteomyelitis. 2. Postsurgical changes with prior amputation of the first and fourth digits as described above. Electronically Signed   By: Larose HiresImran  Ahmed D.O.   On:  05/24/2021 21:08   PERIPHERAL VASCULAR CATHETERIZATION  Result Date: 05/25/2021 See surgical note for result.  DG Chest Port 1 View  Result Date: 05/24/2021 CLINICAL DATA:  Hyperglycemia. Right foot discoloration with fever. Questionable sepsis. EXAM: PORTABLE CHEST 1 VIEW COMPARISON:  Radiographs 05/15/2021 and 02/10/2021. FINDINGS: 1348 hours. Low lung volumes. The heart size and mediastinal contours are normal. The lungs are clear. There is no pleural effusion or pneumothorax. No acute osseous findings are identified. Telemetry leads overlie the chest. IMPRESSION: No evidence of active infection in the chest. Electronically Signed   By: Carey BullocksWilliam  Veazey M.D.   On: 05/24/2021 13:58   DG Foot Complete Left  Result Date: 05/24/2021 CLINICAL DATA:  Questionable sepsis.  Hyperglycemia.  Black toes. EXAM: LEFT FOOT - COMPLETE 3+ VIEW COMPARISON:  None. FINDINGS: Bones are diffusely osteopenic. Status post first ray amputation at the level of the PIP joint. Fourth ray amputation is identified at the level of the base of the proximal phalanx. Plantar subluxation of the second and third D IP joints identified. No definite fracture or dislocation identified. No focal bone erosions noted. There is diffuse subcutaneous gas overlying the second middle and distal phalanx. IMPRESSION: 1. No acute bone abnormality. 2. Diffuse subcutaneous gas overlying the second middle and distal phalanx. Cannot rule out soft tissue infection. Consider further evaluation with contrast enhanced MRI of the foot. Electronically Signed   By: Signa Kellaylor  Stroud M.D.   On: 05/24/2021 14:01        Scheduled Meds:  [MAR Hold] atorvastatin  20 mg Oral QPM   [id number]       heparin sodium (porcine)       [MAR Hold] insulin aspart  0-15 Units Subcutaneous TID WC   [MAR Hold] insulin aspart  0-5 Units Subcutaneous QHS   [MAR Hold] insulin glargine-yfgn  10 Units Subcutaneous QHS   [MAR Hold] metoprolol tartrate  50 mg Oral BID    midazolam       [MAR Hold] sodium chloride flush  3 mL Intravenous Q12H   Continuous Infusions:  [MAR Hold] sodium chloride 250 mL (05/25/21 1333)   [MAR Hold] piperacillin-tazobactam (ZOSYN)  IV Stopped (05/25/21 0920)   [MAR Hold] vancomycin       LOS: 1 day    Time spent: 27 minutes    Marrion Coyekui Travaris Kosh, MD [facility]   To contact the attending provider between 7A-7P or the covering provider during after hours 7P-7A, please log into the web site [email or web address] and access using universal [place] password for that web site. If you do not have the password, please call the hospital operator.  05/25/2021, 2:31 PM

## 2021-05-25 NOTE — ED Notes (Addendum)
Upon entering the pt room, pt was sitting up in bed, had taken off all monitoring equipment and external urinary catheter.  This RN and Engineer, water provided pt with pericare, clean linens, warm blankets and hooked pt back to all monitoring equipment.  Pt in NAD.  Safety mittens applied at this time. Pt fell back asleep while this RN was still in room charting.

## 2021-05-26 ENCOUNTER — Encounter: Payer: Self-pay | Admitting: Vascular Surgery

## 2021-05-26 DIAGNOSIS — A419 Sepsis, unspecified organism: Principal | ICD-10-CM

## 2021-05-26 DIAGNOSIS — F0390 Unspecified dementia without behavioral disturbance: Secondary | ICD-10-CM

## 2021-05-26 DIAGNOSIS — I10 Essential (primary) hypertension: Secondary | ICD-10-CM

## 2021-05-26 DIAGNOSIS — I739 Peripheral vascular disease, unspecified: Secondary | ICD-10-CM

## 2021-05-26 LAB — CBC
HCT: 25.6 % — ABNORMAL LOW (ref 39.0–52.0)
Hemoglobin: 8.1 g/dL — ABNORMAL LOW (ref 13.0–17.0)
MCH: 28.7 pg (ref 26.0–34.0)
MCHC: 31.6 g/dL (ref 30.0–36.0)
MCV: 90.8 fL (ref 80.0–100.0)
Platelets: 469 10*3/uL — ABNORMAL HIGH (ref 150–400)
RBC: 2.82 MIL/uL — ABNORMAL LOW (ref 4.22–5.81)
RDW: 13.4 % (ref 11.5–15.5)
WBC: 17.5 10*3/uL — ABNORMAL HIGH (ref 4.0–10.5)
nRBC: 0 % (ref 0.0–0.2)

## 2021-05-26 LAB — GLUCOSE, CAPILLARY
Glucose-Capillary: 85 mg/dL (ref 70–99)
Glucose-Capillary: 66 mg/dL — ABNORMAL LOW (ref 70–99)
Glucose-Capillary: 92 mg/dL (ref 70–99)
Glucose-Capillary: 207 mg/dL — ABNORMAL HIGH (ref 70–99)
Glucose-Capillary: 128 mg/dL — ABNORMAL HIGH (ref 70–99)

## 2021-05-26 LAB — BASIC METABOLIC PANEL
Anion gap: 9 (ref 5–15)
BUN: 12 mg/dL (ref 8–23)
CO2: 30 mmol/L (ref 22–32)
Calcium: 8.8 mg/dL — ABNORMAL LOW (ref 8.9–10.3)
Chloride: 100 mmol/L (ref 98–111)
Creatinine, Ser: 0.74 mg/dL (ref 0.61–1.24)
GFR, Estimated: >60 mL/min (ref >60)
Glucose, Bld: 130 mg/dL — ABNORMAL HIGH (ref 70–99)
Potassium: 3.7 mmol/L (ref 3.5–5.1)
Sodium: 139 mmol/L (ref 135–145)

## 2021-05-26 LAB — URINE CULTURE: Culture: NO GROWTH

## 2021-05-26 LAB — MAGNESIUM: Magnesium: 1.9 mg/dL (ref 1.7–2.4)

## 2021-05-26 LAB — HOMOCYSTEINE: Homocysteine: 14.2 umol/L (ref 0.0–21.3)

## 2021-05-26 MED ORDER — INSULIN GLARGINE-YFGN 100 UNIT/ML ~~LOC~~ SOLN
6.0000 [IU] | Freq: Every day | SUBCUTANEOUS | Status: DC
  Administered 2021-05-26 – 2021-06-06 (×12): 6 [IU] via SUBCUTANEOUS
  Filled 2021-05-26 (×13): qty 0.06

## 2021-05-26 NOTE — Progress Notes (Signed)
PODIATRY / FOOT AND ANKLE SURGERY PROGRESS NOTE  Requesting Physician: Artis Delay, MD  Reason for consult: L foot gangrene  Chief Complaint: L foot infection   HPI: Jose Schroeder is a 88 y.o. male who presents today resting in bed comfortably.  Patient does not have any dressings on his left foot.  Per nursing staff patient has been pulling out IVs and removing dressings to the area.  Patient still appears to be slightly confused today but closer to baseline.  Patient denies any pain to his foot at this time.  PMHx:  Past Medical History:  Diagnosis Date   Diabetes mellitus without complication (HCC)    Hypertension    Peripheral vascular disease (HCC)     Surgical Hx:  Past Surgical History:  Procedure Laterality Date   AMPUTATION TOE Right 01/15/2016   Procedure: AMPUTATION TOE;  Surgeon: Gwyneth Revels, DPM;  Location: [facility] ORS;  Service: Podiatry;  Laterality: Right;   LOWER EXTREMITY ANGIOGRAPHY Left 05/25/2021   Procedure: Lower Extremity Angiography;  Surgeon: Renford Dills, MD;  Location: [facility] INVASIVE CV LAB;  Service: Cardiovascular;  Laterality: Left;   PERIPHERAL VASCULAR CATHETERIZATION Right 01/07/2016   Procedure: Abdominal Aortogram w/Lower Extremity;  Surgeon: Renford Dills, MD;  Location: [facility] INVASIVE CV LAB;  Service: Cardiovascular;  Laterality: Right;   TOE SURGERY     amputated 2 toes on L) foot    FHx:  Family History  Problem Relation Age of Onset   Diabetes Mother    Hypertension Mother    Diabetes Father    Hypertension Father    Diabetes Daughter     Social History:  reports that he has never smoked. He has never used smokeless tobacco. He reports that he does not drink alcohol and does not use drugs.  Allergies: No Known Allergies  Medications Prior to Admission  Medication Sig Dispense Refill   acetaminophen (TYLENOL) 325 MG tablet Take 2 tablets (650 mg total) by mouth every 6 (six) hours as needed for mild pain (or Fever >/=  101). 20 tablet 0   amLODipine (NORVASC) 10 MG tablet Take 1 tablet (10 mg total) by mouth daily. 90 tablet 1   atorvastatin (LIPITOR) 20 MG tablet Take 1 tablet (20 mg total) by mouth daily. 90 tablet 1   ferrous sulfate 325 (65 FE) MG tablet Take 1 tablet (325 mg total) by mouth daily with breakfast. 90 tablet 1   gabapentin (NEURONTIN) 100 MG capsule TAKE 1 CAPSULE BY MOUTH 3 TIMES  DAILY 270 capsule 3   glipiZIDE (GLUCOTROL XL) 10 MG 24 hr tablet Take 1 tablet (10 mg total) by mouth daily with breakfast. 90 tablet 1   hydrochlorothiazide (HYDRODIURIL) 25 MG tablet Take 1 tablet (25 mg total) by mouth daily. 90 tablet 1   lisinopril (ZESTRIL) 10 MG tablet Take 1 tablet (10 mg total) by mouth daily. 90 tablet 1   metFORMIN (GLUCOPHAGE) 500 MG tablet TAKE 1 TABLET BY MOUTH  TWICE DAILY 180 tablet 3   metoprolol tartrate (LOPRESSOR) 50 MG tablet Take 1 tablet (50 mg total) by mouth 2 (two) times daily. 28 tablet 0   sitaGLIPtin (JANUVIA) 100 MG tablet Take 1 tablet (100 mg total) by mouth daily. 90 tablet 1   aspirin EC 81 MG tablet Take 1 tablet (81 mg total) by mouth daily. (Patient not taking: Reported on 05/24/2021) 30 tablet 2   ONETOUCH ULTRA test strip USE AS DIRECTED BY  PHYSICIAN 200 strip 5  Physical Exam: General: Alert and oriented.  No apparent distress.  Vascular: DP/PT pulses left nonpalpable, no hair growth to digits, mild edema present to the left forefoot with minimal erythema.  Neuro: Light touch sensation absent to left foot and ankle.  Derm: Stable dry gangrene present to the left second toe that extends to the second metatarsal phalangeal joint, some dusky skin discoloration present to the plantar aspect the left second metatarsal phalangeal joint as well as the dorsal aspect of the left forefoot at the metatarsal phalangeal joint level.  Large blister type formation present to the plantar aspect of the second metatarsal phalangeal joint that extends up the flexor  sheath area to the instep, no obvious palpable fluctuance present.      MSK: Right below-knee amputation, left partial hallux amputation, left fourth toe amputation.  Results for orders placed or performed during the hospital encounter of 05/24/21 (from the past 48 hour(s))  Iron and TIBC     Status: Abnormal   Collection Time: 05/24/21  1:20 PM  Result Value Ref Range   Iron 18 (L) 45 - 182 ug/dL   TIBC 161167 (L) 096250 - 045450 ug/dL   Saturation Ratios 11 (L) 17.9 - 39.5 %   UIBC 149 ug/dL    Comment: Performed at [facility], [address] [place], [place] [address]  Ferritin (Iron Binding Protein)     Status: Abnormal   Collection Time: 05/24/21  1:20 PM  Result Value Ref Range   Ferritin 643 (H) 24 - 336 ng/mL    Comment: Performed at [facility], [address] [place], [place] [address]  Prepare RBC (crossmatch)     Status: None   Collection Time: 05/24/21  2:50 PM  Result Value Ref Range   Order Confirmation      ORDER PROCESSED BY BLOOD BANK Performed at [facility], [address] [place], [place] [address]   Type and screen     Status: None   Collection Time: 05/24/21  3:38 PM  Result Value Ref Range   ABO/RH(D) A POS    Antibody Screen NEG    Sample Expiration 05/27/2021,2359    Unit Number [id number]    Blood Component Type RED CELLS,LR    Unit division 00    Status of Unit ISSUED,FINAL    Transfusion Status OK TO TRANSFUSE    Crossmatch Result      Compatible Performed at [facility], [address] [place], [place] [address]   Pathologist smear review     Status: None   Collection Time: 05/24/21  4:15 PM  Result Value Ref Range   Path Review Blood smear is reviewed.     Comment: Mild thrombocytosis is present. There is anemia with normal RBC indices. Rouleaux formation is noted. No nucleated RBCs are seen. There is neutrophilic leukocytosis with mild left shift. No blasts or abnormal leukocytes are  identified. Evaluation for monoclonal gammopathy can be considered given the presence of rouleaux and increasing total protein with low albumin. The increase in platelet count is likely reactive to inflammation, but correlation with iron studies is needed.  Leukocytosis is due to reactive neutrophils. Reviewed by Elnita MaxwellMary S. Forde Dandylney, MD. Performed at [facility], [address] [place], [place] [address]   Save Smear     Status: None   Collection Time: 05/24/21  4:15 PM  Result Value Ref Range   Smear Review SMEAR STAINED AND AVAILABLE FOR REVIEW     Comment:  Performed at [facility], [address], [place], [place] [address]  Aerobic/Anaerobic Culture w Gram Stain (surgical/deep wound)     Status: None (Preliminary result)   Collection Time: 05/24/21  5:34 PM   Specimen: Wound  Result Value Ref Range   Specimen Description      WOUND Performed at [facility], [address] [place], [place] [address]    Special Requests      LEFT FOOT Performed at [facility], [address] [place], [place] [address]    Gram Stain      NO SQUAMOUS EPITHELIAL CELLS SEEN FEW WBC SEEN FEW GRAM NEGATIVE RODS ABUNDANT GRAM POSITIVE COCCI    Culture      ABUNDANT MULTIPLE ORGANISMS PRESENT, NONE PREDOMINANT CULTURE REINCUBATED FOR BETTER GROWTH Performed at [facility], [address] [place], [place] [address]    Report Status PENDING   CBG monitoring, ED     Status: Abnormal   Collection Time: 05/24/21  8:58 PM  Result Value Ref Range   Glucose-Capillary 152 (H) 70 - 99 mg/dL    Comment: Glucose reference range applies only to samples taken after fasting for at least 8 hours.  Urinalysis, Complete w Microscopic Urine, Random     Status: Abnormal   Collection Time: 05/24/21 10:00 PM  Result Value Ref Range   Color, Urine YELLOW YELLOW   APPearance CLEAR (A) CLEAR   Specific Gravity, Urine 1.010 1.005 - 1.030   pH 7.0 5.0 - 8.0   Glucose, UA  500 (A) NEGATIVE mg/dL   Hgb urine dipstick TRACE (A) NEGATIVE   Bilirubin Urine NEGATIVE NEGATIVE   Ketones, ur NEGATIVE NEGATIVE mg/dL   Protein, ur TRACE (A) NEGATIVE mg/dL   Nitrite NEGATIVE NEGATIVE   Leukocytes,Ua NEGATIVE NEGATIVE   RBC / HPF 0-5 0 - 5 RBC/hpf   WBC, UA 0-5 0 - 5 WBC/hpf   Bacteria, UA NONE SEEN NONE SEEN   Squamous Epithelial / LPF NONE SEEN 0 - 5   Mucus PRESENT    Hyaline Casts, UA PRESENT     Comment: Performed at [facility], [address] [place], [place] [address]  Urine Culture     Status: None   Collection Time: 05/24/21 10:00 PM   Specimen: Urine, Random  Result Value Ref Range   Specimen Description      URINE, RANDOM Performed at [facility], [address] [place], [place] [address]    Special Requests      NONE Performed at [facility], [address], [place], [place] [address]    Culture      NO GROWTH Performed at [facility], [address], [place], [place] [address]    Report Status 05/26/2021 FINAL   CBG monitoring, ED     Status: Abnormal   Collection Time: 05/25/21  7:35 AM  Result Value Ref Range   Glucose-Capillary 118 (H) 70 - 99 mg/dL    Comment: Glucose reference range applies only to samples taken after fasting for at least 8 hours.  CBG monitoring, ED     Status: Abnormal   Collection Time: 05/25/21 11:53 AM  Result Value Ref Range   Glucose-Capillary 147 (H) 70 - 99 mg/dL    Comment: Glucose reference range applies only to samples taken after fasting for at least 8 hours.  Lactic acid, plasma     Status: None   Collection Time: 05/25/21  3:32 PM  Result  Value Ref Range   Lactic Acid, Venous 1.4 0.5 - 1.9 mmol/L    Comment: Performed at [facility], [address] [place], [place] [address]  Vitamin B12     Status: Abnormal   Collection Time: 05/25/21  3:32 PM  Result Value Ref Range   Vitamin B-12 >7,500 (H) 180 - 914 pg/mL    Comment: RESULT CONFIRMED BY  AUTOMATED DILUTION (NOTE) This assay is not validated for testing neonatal or myeloproliferative syndrome specimens for Vitamin B12 levels. Performed at [facility], [address] [place], [place] [address]   Ferritin     Status: Abnormal   Collection Time: 05/25/21  3:32 PM  Result Value Ref Range   Ferritin 706 (H) 24 - 336 ng/mL    Comment: Performed at [facility], [address] [place], [place] [address]  Creatinine, serum     Status: None   Collection Time: 05/25/21  3:32 PM  Result Value Ref Range   Creatinine, Ser 0.76 0.61 - 1.24 mg/dL   GFR, Estimated >91>60 >47>60 mL/min    Comment: (NOTE) Calculated using the CKD-EPI Creatinine Equation (2021) Performed at [facility], [address] [place], [place] [address]   Glucose, capillary     Status: None   Collection Time: 05/25/21  4:58 PM  Result Value Ref Range   Glucose-Capillary 88 70 - 99 mg/dL    Comment: Glucose reference range applies only to samples taken after fasting for at least 8 hours.  Glucose, capillary     Status: Abnormal   Collection Time: 05/25/21  9:42 PM  Result Value Ref Range   Glucose-Capillary 228 (H) 70 - 99 mg/dL    Comment: Glucose reference range applies only to samples taken after fasting for at least 8 hours.  CBC     Status: Abnormal   Collection Time: 05/26/21  3:44 AM  Result Value Ref Range   WBC 17.5 (H) 4.0 - 10.5 K/uL   RBC 2.82 (L) 4.22 - 5.81 MIL/uL   Hemoglobin 8.1 (L) 13.0 - 17.0 g/dL   HCT 21.325.6 (L) 08.639.0 - 57.852.0 %   MCV 90.8 80.0 - 100.0 fL   MCH 28.7 26.0 - 34.0 pg   MCHC 31.6 30.0 - 36.0 g/dL   RDW 46.913.4 62.911.5 - 52.815.5 %   Platelets 469 (H) 150 - 400 K/uL   nRBC 0.0 0.0 - 0.2 %    Comment: Performed at [facility], [address] [place], [place] [address]  Basic metabolic panel     Status: Abnormal   Collection Time: 05/26/21  3:44 AM  Result Value Ref Range   Sodium 139 135 - 145 mmol/L   Potassium 3.7 3.5 - 5.1 mmol/L   Chloride  100 98 - 111 mmol/L   CO2 30 22 - 32 mmol/L   Glucose, Bld 130 (H) 70 - 99 mg/dL    Comment: Glucose reference range applies only to samples taken after fasting for at least 8 hours.   BUN 12 8 - 23 mg/dL   Creatinine, Ser 4.010.74 0.61 - 1.24 mg/dL   Calcium 8.8 (L) 8.9 - 10.3 mg/dL   GFR, Estimated >02>60 >72>60 mL/min    Comment: (NOTE) Calculated using the CKD-EPI Creatinine Equation (2021)    Anion gap 9 5 - 15    Comment: Performed at [facility], [address] [place], [place] [address]  Magnesium     Status: None   Collection Time: 05/26/21  3:44 AM  Result Value Ref Range   Magnesium 1.9 1.7 - 2.4 mg/dL    Comment: Performed at [facility], [address] [place], [place] [address]  Glucose, capillary     Status: None   Collection Time: 05/26/21  7:33 AM  Result Value Ref Range   Glucose-Capillary 85 70 - 99 mg/dL    Comment: Glucose reference range applies only to samples taken after fasting for at least 8 hours.  Glucose, capillary     Status: Abnormal   Collection Time: 05/26/21 11:16 AM  Result Value Ref Range   Glucose-Capillary 66 (L) 70 - 99 mg/dL    Comment: Glucose reference range applies only to samples taken after fasting for at least 8 hours.  Glucose, capillary     Status: None   Collection Time: 05/26/21 12:25 PM  Result Value Ref Range   Glucose-Capillary 92 70 - 99 mg/dL    Comment: Glucose reference range applies only to samples taken after fasting for at least 8 hours.   CT HEAD WO CONTRAST ( )  Result Date: 05/24/2021 CLINICAL DATA:  Head and neck trauma.  Hyperglycemia.  Fever. EXAM: CT HEAD WITHOUT CONTRAST CT CERVICAL SPINE WITHOUT CONTRAST TECHNIQUE: Multidetector CT imaging of the head and cervical spine was performed following the standard protocol without intravenous contrast. Multiplanar CT image reconstructions of the cervical spine were also generated. RADIATION DOSE REDUCTION: This exam was performed according to the  departmental dose-optimization program which includes automated exposure control, adjustment of the mA and/or kV according to patient size and/or use of iterative reconstruction technique. COMPARISON:  Head CT 02/10/2021 FINDINGS: CT HEAD FINDINGS Brain: There is no evidence of an acute infarct, intracranial hemorrhage, mass, midline shift, or extra-axial fluid collection. Generalized cerebral atrophy is mild for age. Hypodensities in the cerebral white matter bilaterally are unchanged and nonspecific but compatible with mild-to-moderate chronic small vessel ischemic disease. Vascular: Calcified atherosclerosis at the skull base. No hyperdense vessel. Skull: No acute fracture or suspicious osseous lesion. Sinuses/Orbits: Visualized paranasal sinuses and mastoid air cells are clear. Bilateral cataract extraction. Other: None. CT CERVICAL SPINE FINDINGS Alignment: Straightening of the normal cervical lordosis. Trace retrolisthesis of C5 on C6 and C6 on C7. Skull base and vertebrae: No acute fracture or suspicious osseous lesion. Soft tissues and spinal canal: No prevertebral fluid or swelling. No visible canal hematoma. Disc levels: Moderately advanced disc degeneration from C3-4 to C6-7. Moderate multilevel facet arthrosis in the cervical and included upper thoracic spine. Partial interbody and bilateral facet ankylosis at C4-5. Moderate multilevel neural foraminal stenosis due to uncovertebral and facet spurring. Multilevel spinal stenosis, moderate at C3-4. Upper chest: Biapical pleuroparenchymal lung scarring. Other: Prominent calcific atherosclerosis at the carotid bifurcations. IMPRESSION: 1. No evidence of acute intracranial abnormality. 2. Mild-to-moderate chronic small vessel ischemic disease. 3. No evidence of acute cervical spine fracture. Electronically Signed   By: Sebastian Ache M.D.   On: 05/24/2021 13:46   CT Cervical Spine Wo Contrast  Result Date: 05/24/2021 CLINICAL DATA:  Head and neck trauma.   Hyperglycemia.  Fever. EXAM: CT HEAD WITHOUT CONTRAST CT CERVICAL SPINE WITHOUT CONTRAST TECHNIQUE: Multidetector CT imaging of the head and cervical spine was performed following the standard protocol without intravenous contrast. Multiplanar CT image reconstructions of the cervical spine were also generated. RADIATION DOSE REDUCTION: This exam was performed according to the departmental dose-optimization program which includes automated exposure control, adjustment of the mA and/or kV according to patient size and/or use of iterative  reconstruction technique. COMPARISON:  Head CT 02/10/2021 FINDINGS: CT HEAD FINDINGS Brain: There is no evidence of an acute infarct, intracranial hemorrhage, mass, midline shift, or extra-axial fluid collection. Generalized cerebral atrophy is mild for age. Hypodensities in the cerebral white matter bilaterally are unchanged and nonspecific but compatible with mild-to-moderate chronic small vessel ischemic disease. Vascular: Calcified atherosclerosis at the skull base. No hyperdense vessel. Skull: No acute fracture or suspicious osseous lesion. Sinuses/Orbits: Visualized paranasal sinuses and mastoid air cells are clear. Bilateral cataract extraction. Other: None. CT CERVICAL SPINE FINDINGS Alignment: Straightening of the normal cervical lordosis. Trace retrolisthesis of C5 on C6 and C6 on C7. Skull base and vertebrae: No acute fracture or suspicious osseous lesion. Soft tissues and spinal canal: No prevertebral fluid or swelling. No visible canal hematoma. Disc levels: Moderately advanced disc degeneration from C3-4 to C6-7. Moderate multilevel facet arthrosis in the cervical and included upper thoracic spine. Partial interbody and bilateral facet ankylosis at C4-5. Moderate multilevel neural foraminal stenosis due to uncovertebral and facet spurring. Multilevel spinal stenosis, moderate at C3-4. Upper chest: Biapical pleuroparenchymal lung scarring. Other: Prominent calcific  atherosclerosis at the carotid bifurcations. IMPRESSION: 1. No evidence of acute intracranial abnormality. 2. Mild-to-moderate chronic small vessel ischemic disease. 3. No evidence of acute cervical spine fracture. Electronically Signed   By: Sebastian Ache M.D.   On: 05/24/2021 13:46   MR FOOT LEFT WO CONTRAST  Result Date: 05/24/2021 CLINICAL DATA:  Foot swelling, down buttock, osteomyelitis suspected. EXAM: MRI OF THE LEFT FOOT WITHOUT CONTRAST TECHNIQUE: Multiplanar, multisequence MR imaging of the left forefoot was performed. No intravenous contrast was administered. COMPARISON:  Radiographs dated May 24, 2021 FINDINGS: Bones/Joint/Cartilage Postsurgical changes with amputation at the proximal interphalangeal joint of the first digit. Fourth ray amputation at the level of the base of the proximal phalanx. Bone marrow edema of the second metatarsal shaft which in the presence of adjacent edema and phlegmonous changes with multiple foci of gas is highly suspicious for acute osteomyelitis. There is also heterogeneously increased signal of the third metatarsal shaft, also concerning for osteomyelitis. Ligaments Collateral ligaments are intact.  Lisfranc ligament is intact. Muscles and Tendons Flexor, peroneal and extensor compartment tendons are intact. 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 Abnormal intramuscular signal of the plantar muscles about the second digit concerning for phlegmonous changes. Soft tissue No fluid collection or hematoma.  No soft tissue mass. IMPRESSION: 1. Abnormal marrow signal of the second and third metatarsals, which in the presence of adjacent phlegmonous changes about the dorsal and plantar aspect of the foot and marked subcutaneous soft tissue edema is consistent with acute osteomyelitis. 2. Postsurgical changes with prior amputation of the first and fourth digits as described above. Electronically Signed   By: Larose Hires D.O.   On: 05/24/2021 21:08   PERIPHERAL VASCULAR CATHETERIZATION  Result Date: 05/25/2021 See surgical note for result.  DG Chest Port 1 View  Result Date: 05/24/2021 CLINICAL DATA:  Hyperglycemia. Right foot discoloration with fever. Questionable sepsis. EXAM: PORTABLE CHEST 1 VIEW COMPARISON:  Radiographs 05/15/2021 and 02/10/2021. FINDINGS: 1348 hours. Low lung volumes. The heart size and mediastinal contours are normal. The lungs are clear. There is no pleural effusion or pneumothorax. No acute osseous findings are identified. Telemetry leads overlie the chest. IMPRESSION: No evidence of active infection in the chest. Electronically Signed   By: Carey Bullocks M.D.   On: 05/24/2021 13:58   DG Foot Complete Left  Result Date:  05/24/2021 CLINICAL DATA:  Questionable sepsis.  Hyperglycemia.  Black toes. EXAM: LEFT FOOT - COMPLETE 3+ VIEW COMPARISON:  None. FINDINGS: Bones are diffusely osteopenic. Status post first ray amputation at the level of the PIP joint. Fourth ray amputation is identified at the level of the base of the proximal phalanx. Plantar subluxation of the second and third D IP joints identified. No definite fracture or dislocation identified. No focal bone erosions noted. There is diffuse subcutaneous gas overlying the second middle and distal phalanx. IMPRESSION: 1. No acute bone abnormality. 2. Diffuse subcutaneous gas overlying the second middle and distal phalanx. Cannot rule out soft tissue infection. Consider further evaluation with contrast enhanced MRI of the foot. Electronically Signed   By: Signa Kell M.D.   On: 05/24/2021 14:01    Blood pressure 138/62, pulse 73, temperature 98.5 F (36.9 C), temperature source Oral, resp. rate 16, height 5\' 10"  (1.778 m), weight 65 kg, SpO2 100 %.   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 -Previous x-ray and MRI imaging reviewed. -Appears to have worsening necrosis of dorsal and plantar foot.  Appears to have necrosis at the extending around the flexor tendon area of the foot extending up to the mid arch.  Dorsal and plantar forefoot left side appears to be cool to touch with reduced capillary fill time. -Appreciate vascular recommendations.  Will allow to demarcate for 1 more day further but ultimately at this time believe patient will likely have to undergo a below-knee amputation due to the amount of skin and tissue necrosis.  Currently appears to be stable. -Recommend Betadine wet-to-dry dressings to be placed daily. -Appreciate medicine recommendations for antibiotic therapy.  Culture growing multiple organisms, none predominate, report pending.  Believe antibiotic therapy to the area is not very beneficial due to patient's very poor circulation as antibiotic delivery is poor as well due to this. -We will evaluate tomorrow 1 more time and if once again appears to be worsening or unimproved then we will recommend more approximately potation with vascular surgery.  Discussed with Dr. Gilda Crease.  Rosetta Posner, DPM 05/26/2021, 1:01 PM

## 2021-05-26 NOTE — Assessment & Plan Note (Addendum)
Mental status still slightly impaired from few days ago.  Continue to monitor closely.

## 2021-05-26 NOTE — Plan of Care (Signed)

## 2021-05-26 NOTE — Progress Notes (Signed)
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  Currently on vancomycin and Zosyn.  Likely will end up needing an amputation.  Podiatry letting the foot demarcate another day before deciding on the level.  Dementia without behavioral disturbance [facility]) Patient asked me to leave him alone 4 times.  I finally left him alone but I was able to ask him some questions and examine him before hand.  Peripheral vascular disease [facility]) Patient had an angiogram by Dr. Gilda Crease on 05/25/2021 with angioplasties and 2 stents placed.  Patient on Plavix and Lipitor  Diabetes mellitus (HCC) Type 2 diabetes mellitus.  Most recent hemoglobin A1c elevated 8.7 on 01/12/2021.  Patient on low-dose Lantus insulin.  Continue sliding scale.  Essential hypertension- (present on admission) Continue metoprolol        Subjective: Patient told me 4 times to get out of the room.  I was able to ask him a few questions and distract him while I examined him.  Patient just wanted to be left alone.  Initially came in with sepsis and gangrene of the second toe.  Physical Exam: Vitals:   05/26/21 1000 05/26/21 1118 05/26/21 1121 05/26/21 1522  BP: (!) 155/82 137/60 138/62 136/60  Pulse: 87 73 73 85  Resp: 15 18 16 16   Temp: 98.3 F (36.8 C) 98.4 F (36.9 C) 98.5 F (36.9 C) 98.2 F (36.8 C)  TempSrc:   Oral   SpO2: 100% 100% 100% 100%  Weight:      Height:       Physical Exam HENT:     Head: Normocephalic.     Mouth/Throat:     Pharynx: No oropharyngeal exudate.  Eyes:     General: Lids are normal.     Conjunctiva/sclera: Conjunctivae normal.  Cardiovascular:     Rate and Rhythm: Normal rate and regular rhythm.      Heart sounds: Normal heart sounds, S1 normal and S2 normal.  Pulmonary:     Breath sounds: Normal breath sounds. No decreased breath sounds, wheezing, rhonchi or rales.  Abdominal:     Palpations: Abdomen is soft.     Tenderness: There is no abdominal tenderness.  Musculoskeletal:     Left lower leg: No swelling.  Skin:    Comments: Left second toe gangrene extending into the bottom and top of the foot.  Neurological:     Mental Status: He is alert.     Comments: Answer some simple questions.        Data Reviewed: Laboratory and radiological data reviewed from his hospital course.  Specialist and my associates notes also reviewed.  Family Communication: Case discussed with patient's wife on the phone  Disposition: Status is: Inpatient Remains inpatient appropriate because: He will likely need amputation prior to disposition   Planned Discharge Destination: Home  Author: , MD 05/26/2021 6:54 PM  For on call review [email or web address].

## 2021-05-26 NOTE — Progress Notes (Cosign Needed)
Patient is resting in bed with eyes closed. Has not eaten breakfast yet. Call light within reach. Pt is calm and not currently pulling at any of his medical devices or lines. Will continue to monitor his condition for changes.

## 2021-05-26 NOTE — Assessment & Plan Note (Addendum)
Type 2 diabetes mellitus.  Most recent hemoglobin A1c elevated 8.7 on 01/12/2021.  Patient on low-dose Lantus insulin.  Continue sliding scale.

## 2021-05-26 NOTE — Assessment & Plan Note (Addendum)
Patient had an angiogram by Dr. Delana Meyer on 05/25/2021 with angioplasties and 2 stents placed.  Continue Plavix and Lipitor.

## 2021-05-26 NOTE — Assessment & Plan Note (Deleted)
Continue metoprolol. 

## 2021-05-26 NOTE — Progress Notes (Cosign Needed)
Pt is resting in bed with both eyes closed.  Refused to eat breakfast, have bath, or change linens.  Pt is calm and relaxed.  IV still infusing. Call light within reach. Will continue to monitor for changes.

## 2021-05-26 NOTE — Assessment & Plan Note (Addendum)
Clinical sepsis, present on admission with left second toe gangrene with progression on to the bottom of the foot and top of the foot.  Area of demarcation is larger than a few days ago.  Patient spiked a fever of 101, 3 nights ago.  Blood cultures negative so far.  Change antibiotics to Unasyn.  Will discontinue Unasyn since having a BKA

## 2021-05-26 NOTE — Assessment & Plan Note (Signed)
Continue metoprolol. 

## 2021-05-27 ENCOUNTER — Ambulatory Visit (INDEPENDENT_AMBULATORY_CARE_PROVIDER_SITE_OTHER): Payer: Medicare Other | Admitting: Vascular Surgery

## 2021-05-27 ENCOUNTER — Encounter (INDEPENDENT_AMBULATORY_CARE_PROVIDER_SITE_OTHER): Payer: Medicare Other

## 2021-05-27 LAB — GLUCOSE, CAPILLARY
Glucose-Capillary: 125 mg/dL — ABNORMAL HIGH (ref 70–99)
Glucose-Capillary: 192 mg/dL — ABNORMAL HIGH (ref 70–99)
Glucose-Capillary: 285 mg/dL — ABNORMAL HIGH (ref 70–99)
Glucose-Capillary: 223 mg/dL — ABNORMAL HIGH (ref 70–99)

## 2021-05-27 LAB — CREATININE, SERUM
Creatinine, Ser: 0.76 mg/dL (ref 0.61–1.24)
GFR, Estimated: >60 mL/min (ref >60)

## 2021-05-27 NOTE — Progress Notes (Signed)
F.u left foot gangrenous changes.  Continued gangrenous changes to foot into arch.  Foot is cold to distal 1/2  Pic from yesterday.    Ability for limb salvage is minimal and likely will not heal given extremely poor residual flow to foot despite attempts at re-vascularization by vascular surgery.   At this time I would recommend BKA and have discussed with vascular surgery and the pts wife via phone.  Will defer further planning to primary teams and vascular at this time.

## 2021-05-27 NOTE — Care Management Important Message (Signed)
Important Message  Patient Details  Name: Jose Schroeder MRN: [id number] Date of Birth: 1933-04-24   Medicare Important Message Given:  N/A - LOS <3 / Initial given by admissions     Olegario Messier A Terence Bart 05/27/2021, 8:47 AM

## 2021-05-27 NOTE — Progress Notes (Signed)
MRN : [id number]  Jose Schroeder is a 88 y.o. ([birth year]) male who presents with chief complaint of dry gangrene left foot.  History of Present Illness:  Patient is status post angiogram with successful distal SFA popliteal intervention.  This evening when I visited Mr. Jose Schroeder he was comfortable he denies pain in his foot.  Current Meds  Medication Sig   acetaminophen (TYLENOL) 325 MG tablet Take 2 tablets (650 mg total) by mouth every 6 (six) hours as needed for mild pain (or Fever >/= 101).   amLODipine (NORVASC) 10 MG tablet Take 1 tablet (10 mg total) by mouth daily.   atorvastatin (LIPITOR) 20 MG tablet Take 1 tablet (20 mg total) by mouth daily.   ferrous sulfate 325 (65 FE) MG tablet Take 1 tablet (325 mg total) by mouth daily with breakfast.   gabapentin (NEURONTIN) 100 MG capsule TAKE 1 CAPSULE BY MOUTH 3 TIMES  DAILY   glipiZIDE (GLUCOTROL XL) 10 MG 24 hr tablet Take 1 tablet (10 mg total) by mouth daily with breakfast.   hydrochlorothiazide (HYDRODIURIL) 25 MG tablet Take 1 tablet (25 mg total) by mouth daily.   lisinopril (ZESTRIL) 10 MG tablet Take 1 tablet (10 mg total) by mouth daily.   metFORMIN (GLUCOPHAGE) 500 MG tablet TAKE 1 TABLET BY MOUTH  TWICE DAILY   metoprolol tartrate (LOPRESSOR) 50 MG tablet Take 1 tablet (50 mg total) by mouth 2 (two) times daily.   sitaGLIPtin (JANUVIA) 100 MG tablet Take 1 tablet (100 mg total) by mouth daily.    Past Medical History:  Diagnosis Date   Diabetes mellitus without complication ([place])    Hypertension    Peripheral vascular disease ([place])     Past Surgical History:  Procedure Laterality Date   AMPUTATION TOE Right 01/15/2016   Procedure: AMPUTATION TOE;  Surgeon: Samara Deist, DPM;  Location: [facility];  Service: Podiatry;  Laterality: Right;   LOWER EXTREMITY ANGIOGRAPHY Left 05/25/2021   Procedure: Lower Extremity Angiography;  Surgeon: Katha Cabal, MD;  Location: [facility];  Service:  Cardiovascular;  Laterality: Left;   PERIPHERAL VASCULAR CATHETERIZATION Right 01/07/2016   Procedure: Abdominal Aortogram w/Lower Extremity;  Surgeon: Katha Cabal, MD;  Location: [facility];  Service: Cardiovascular;  Laterality: Right;   TOE SURGERY     amputated 2 toes on L) foot    Social History Social History   Tobacco Use   Smoking status: Never   Smokeless tobacco: Never   Tobacco comments:    Smoking cessation materials not required  Vaping Use   Vaping Use: Never used  Substance Use Topics   Alcohol use: No    Alcohol/week: 0.0 standard drinks   Drug use: No    Family History Family History  Problem Relation Age of Onset   Diabetes Mother    Hypertension Mother    Diabetes Father    Hypertension Father    Diabetes Daughter     No Known Allergies   REVIEW OF SYSTEMS (Negative unless checked)  Constitutional: [] Weight loss  [] Fever  [] Chills Cardiac: [] Chest pain   [] Chest pressure   [] Palpitations   [] Shortness of breath when laying flat   [] Shortness of breath with exertion. Vascular:  [] Pain in legs with walking   [] Pain in legs at rest  [] History of DVT   [] Phlebitis   [] Swelling in legs   [] Varicose veins   [x] Non-healing ulcers Pulmonary:   [] Uses home oxygen   [] Productive cough   []   Hemoptysis   [] Wheeze  [] COPD   [] Asthma Neurologic:  [] Dizziness   [] Seizures   [] History of stroke   [] History of TIA  [] Aphasia   [] Vissual changes   [] Weakness or numbness in arm   [] Weakness or numbness in leg Musculoskeletal:   [] Joint swelling   [] Joint pain   [] Low back pain Hematologic:  [] Easy bruising  [] Easy bleeding   [] Hypercoagulable state   [] Anemic Gastrointestinal:  [] Diarrhea   [] Vomiting  [] Gastroesophageal reflux/heartburn   [] Difficulty swallowing. Genitourinary:  [] Chronic kidney disease   [] Difficult urination  [] Frequent urination   [] Blood in urine Skin:  [] Rashes   [] Ulcers  Psychological:  [] History of anxiety   []  History of major  depression.  Physical Examination  Vitals:   05/27/21 0631 05/27/21 0729 05/27/21 1125 05/27/21 1615  BP: (!) 132/56 (!) 145/62 132/65 135/63  Pulse: 94 94 87 97  Resp: 16 15 16 17   Temp: 98.2 F (36.8 C) 98.7 F (37.1 C) 98.5 F (36.9 C) 98.6 F (37 C)  TempSrc:      SpO2: 98% 100% 100% 100%  Weight:      Height:       Body mass index is 20.56 kg/m. Gen: WD/WN, NAD Head: /AT, No temporalis wasting.  Ear/Nose/Throat: Hearing grossly intact, nares w/o erythema or drainage Eyes: PER, EOMI, sclera nonicteric.  Neck: Supple, no masses.  No bruit or JVD.  Pulmonary:  Good air movement, no audible wheezing, no use of accessory muscles.  Cardiac: RRR, normal S1, S2, no Murmurs. Vascular: There is dry gangrene of the left second toe in association with patchy areas of skin loss on the plantar surface.  The foot is quite warm to the touch. Vessel Right Left  Radial Palpable Palpable  PT BKA Not palpable  DP BKA Not palpable  Gastrointestinal: soft, non-distended. No guarding/no peritoneal signs.  Musculoskeletal: M/S 5/5 throughout.  No visible deformity.  Neurologic: CN 2-12 intact. Pain and light touch intact in extremities.  Symmetrical.  Speech is fluent. Motor exam as listed above. Psychiatric: Judgment intact, Mood & affect appropriate for pt's clinical situation. Dermatologic: No rashes or ulcers noted.  No changes consistent with cellulitis.   CBC Lab Results  Component Value Date   WBC 17.5 (H) 05/26/2021   HGB 8.1 (L) 05/26/2021   HCT 25.6 (L) 05/26/2021   MCV 90.8 05/26/2021   PLT 469 (H) 05/26/2021    BMET    Component Value Date/Time   NA 139 05/26/2021 0344   NA 140 02/19/2021 1538   NA 141 11/12/2012 0717   K 3.7 05/26/2021 0344   K 4.0 11/12/2012 0717   CL 100 05/26/2021 0344   CL 106 11/12/2012 0717   CO2 30 05/26/2021 0344   CO2 31 11/12/2012 0717   GLUCOSE 130 (H) 05/26/2021 0344   GLUCOSE 149 (H) 11/12/2012 0717   BUN 12 05/26/2021 0344    BUN 9 02/19/2021 1538   BUN 10 11/12/2012 0717   CREATININE 0.76 05/27/2021 0352   CREATININE 0.99 11/12/2012 0717   CALCIUM 8.8 (L) 05/26/2021 0344   CALCIUM 9.4 11/12/2012 0717   GFRNONAA >60 05/27/2021 0352   GFRNONAA >60 11/12/2012 0717   GFRAA 90 10/01/2019 1550   GFRAA >60 11/12/2012 0717   Estimated Creatinine Clearance: 60.9 mL/min (by C-G formula based on SCr of 0.76 mg/dL).  COAG Lab Results  Component Value Date   INR 1.2 05/24/2021   INR 1.2 05/13/2021   INR 1.4 (H) 02/11/2021  Radiology DG Chest 2 View  Result Date: 05/13/2021 CLINICAL DATA:  Sepsis EXAM: CHEST - 2 VIEW COMPARISON:  None. FINDINGS: The cardiac silhouette, mediastinal and hilar contours are within normal limits. The lungs are clear. No pulmonary edema, pleural effusions or infiltrates. No pulmonary lesions. No pneumothorax. The bony thorax is intact. IMPRESSION: No acute cardiopulmonary findings. Electronically Signed   By: Marijo Sanes M.D.   On: 05/13/2021 13:52   CT HEAD WO CONTRAST (5MM)  Result Date: 05/24/2021 CLINICAL DATA:  Head and neck trauma.  Hyperglycemia.  Fever. EXAM: CT HEAD WITHOUT CONTRAST CT CERVICAL SPINE WITHOUT CONTRAST TECHNIQUE: Multidetector CT imaging of the head and cervical spine was performed following the standard protocol without intravenous contrast. Multiplanar CT image reconstructions of the cervical spine were also generated. RADIATION DOSE REDUCTION: This exam was performed according to the departmental dose-optimization program which includes automated exposure control, adjustment of the mA and/or kV according to patient size and/or use of iterative reconstruction technique. COMPARISON:  Head CT 02/10/2021 FINDINGS: CT HEAD FINDINGS Brain: There is no evidence of an acute infarct, intracranial hemorrhage, mass, midline shift, or extra-axial fluid collection. Generalized cerebral atrophy is mild for age. Hypodensities in the cerebral white matter bilaterally are unchanged  and nonspecific but compatible with mild-to-moderate chronic small vessel ischemic disease. Vascular: Calcified atherosclerosis at the skull base. No hyperdense vessel. Skull: No acute fracture or suspicious osseous lesion. Sinuses/Orbits: Visualized paranasal sinuses and mastoid air cells are clear. Bilateral cataract extraction. Other: None. CT CERVICAL SPINE FINDINGS Alignment: Straightening of the normal cervical lordosis. Trace retrolisthesis of C5 on C6 and C6 on C7. Skull base and vertebrae: No acute fracture or suspicious osseous lesion. Soft tissues and spinal canal: No prevertebral fluid or swelling. No visible canal hematoma. Disc levels: Moderately advanced disc degeneration from C3-4 to C6-7. Moderate multilevel facet arthrosis in the cervical and included upper thoracic spine. Partial interbody and bilateral facet ankylosis at C4-5. Moderate multilevel neural foraminal stenosis due to uncovertebral and facet spurring. Multilevel spinal stenosis, moderate at C3-4. Upper chest: Biapical pleuroparenchymal lung scarring. Other: Prominent calcific atherosclerosis at the carotid bifurcations. IMPRESSION: 1. No evidence of acute intracranial abnormality. 2. Mild-to-moderate chronic small vessel ischemic disease. 3. No evidence of acute cervical spine fracture. Electronically Signed   By: Logan Bores M.D.   On: 05/24/2021 13:46   CT Cervical Spine Wo Contrast  Result Date: 05/24/2021 CLINICAL DATA:  Head and neck trauma.  Hyperglycemia.  Fever. EXAM: CT HEAD WITHOUT CONTRAST CT CERVICAL SPINE WITHOUT CONTRAST TECHNIQUE: Multidetector CT imaging of the head and cervical spine was performed following the standard protocol without intravenous contrast. Multiplanar CT image reconstructions of the cervical spine were also generated. RADIATION DOSE REDUCTION: This exam was performed according to the departmental dose-optimization program which includes automated exposure control, adjustment of the mA and/or kV  according to patient size and/or use of iterative reconstruction technique. COMPARISON:  Head CT 02/10/2021 FINDINGS: CT HEAD FINDINGS Brain: There is no evidence of an acute infarct, intracranial hemorrhage, mass, midline shift, or extra-axial fluid collection. Generalized cerebral atrophy is mild for age. Hypodensities in the cerebral white matter bilaterally are unchanged and nonspecific but compatible with mild-to-moderate chronic small vessel ischemic disease. Vascular: Calcified atherosclerosis at the skull base. No hyperdense vessel. Skull: No acute fracture or suspicious osseous lesion. Sinuses/Orbits: Visualized paranasal sinuses and mastoid air cells are clear. Bilateral cataract extraction. Other: None. CT CERVICAL SPINE FINDINGS Alignment: Straightening of the normal cervical lordosis. Trace retrolisthesis of C5  on C6 and C6 on C7. Skull base and vertebrae: No acute fracture or suspicious osseous lesion. Soft tissues and spinal canal: No prevertebral fluid or swelling. No visible canal hematoma. Disc levels: Moderately advanced disc degeneration from C3-4 to C6-7. Moderate multilevel facet arthrosis in the cervical and included upper thoracic spine. Partial interbody and bilateral facet ankylosis at C4-5. Moderate multilevel neural foraminal stenosis due to uncovertebral and facet spurring. Multilevel spinal stenosis, moderate at C3-4. Upper chest: Biapical pleuroparenchymal lung scarring. Other: Prominent calcific atherosclerosis at the carotid bifurcations. IMPRESSION: 1. No evidence of acute intracranial abnormality. 2. Mild-to-moderate chronic small vessel ischemic disease. 3. No evidence of acute cervical spine fracture. Electronically Signed   By: Logan Bores M.D.   On: 05/24/2021 13:46   MR FOOT LEFT WO CONTRAST  Result Date: 05/24/2021 CLINICAL DATA:  Foot swelling, down buttock, osteomyelitis suspected. EXAM: MRI OF THE LEFT FOOT WITHOUT CONTRAST TECHNIQUE: Multiplanar, multisequence MR  imaging of the left forefoot was performed. No intravenous contrast was administered. COMPARISON:  Radiographs dated May 24, 2021 FINDINGS: Bones/Joint/Cartilage Postsurgical changes with amputation at the proximal interphalangeal joint of the first digit. Fourth ray amputation at the level of the base of the proximal phalanx. Bone marrow edema of the second metatarsal shaft which in the presence of adjacent edema and phlegmonous changes with multiple foci of gas is highly suspicious for acute osteomyelitis. There is also heterogeneously increased signal of the third metatarsal shaft, also concerning for osteomyelitis. Ligaments Collateral ligaments are intact.  Lisfranc ligament is intact. Muscles and Tendons Flexor, peroneal and extensor compartment tendons are intact. 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 Abnormal intramuscular signal of the plantar muscles about the second digit concerning for phlegmonous changes. Soft tissue No fluid collection or hematoma.  No soft tissue mass. IMPRESSION: 1. Abnormal marrow signal of the second and third metatarsals, which in the presence of adjacent phlegmonous changes about the dorsal and plantar aspect of the foot and marked subcutaneous soft tissue edema is consistent with acute osteomyelitis. 2. Postsurgical changes with prior amputation of the first and fourth digits as described above. Electronically Signed   By: Keane Police D.O.   On: 05/24/2021 21:08   PERIPHERAL VASCULAR CATHETERIZATION  Result Date: 05/25/2021 See surgical note for result.  DG Chest Port 1 View  Result Date: 05/24/2021 CLINICAL DATA:  Hyperglycemia. Right foot discoloration with fever. Questionable sepsis. EXAM: PORTABLE CHEST 1 VIEW COMPARISON:  Radiographs 05/15/2021 and 02/10/2021. FINDINGS: 1348 hours. Low lung volumes. The heart size and mediastinal contours are normal. The lungs are clear. There is no pleural effusion or  pneumothorax. No acute osseous findings are identified. Telemetry leads overlie the chest. IMPRESSION: No evidence of active infection in the chest. Electronically Signed   By: Richardean Sale M.D.   On: 05/24/2021 13:58   DG Chest Portable 1 View  Result Date: 05/15/2021 CLINICAL DATA:  Confusion, elevated white blood cell count EXAM: PORTABLE CHEST 1 VIEW COMPARISON:  02/10/2021 FINDINGS: Heart and mediastinal contours are within normal limits. No focal opacities or effusions. No acute bony abnormality. IMPRESSION: No active disease. Electronically Signed   By: Rolm Baptise M.D.   On: 05/15/2021 00:40   DG Foot Complete Left  Result Date: 05/24/2021 CLINICAL DATA:  Questionable sepsis.  Hyperglycemia.  Black toes. EXAM: LEFT FOOT - COMPLETE 3+ VIEW COMPARISON:  None. FINDINGS: Bones are diffusely osteopenic. Status post first ray amputation at the level of the PIP joint. Fourth ray  amputation is identified at the level of the base of the proximal phalanx. Plantar subluxation of the second and third D IP joints identified. No definite fracture or dislocation identified. No focal bone erosions noted. There is diffuse subcutaneous gas overlying the second middle and distal phalanx. IMPRESSION: 1. No acute bone abnormality. 2. Diffuse subcutaneous gas overlying the second middle and distal phalanx. Cannot rule out soft tissue infection. Consider further evaluation with contrast enhanced MRI of the foot. Electronically Signed   By: Kerby Moors M.D.   On: 05/24/2021 14:01   VAS [place] ABI WITH/WO TBI  Result Date: 05/13/2021  LOWER EXTREMITY DOPPLER STUDY Patient Name:  DIONYSIOS PFITZER  Date of Exam:   05/12/2021 Medical Rec #: [id number]            Accession #:    [id number] Date of Birth: February 01, 1933            Patient Gender: M Patient Age:   25 years Exam Location:  [facility] Procedure:      VAS [place] ABI WITH/WO TBI Referring Phys: Hortencia Pilar  --------------------------------------------------------------------------------  Indications: Peripheral artery disease, and right BKA.  Comparison Study: 04/23/2020 Performing Technologist: Almira Coaster RVS  Examination Guidelines: A complete evaluation includes at minimum, Doppler waveform signals and systolic blood pressure reading at the level of bilateral brachial, anterior tibial, and posterior tibial arteries, when vessel segments are accessible. Bilateral testing is considered an integral part of a complete examination. Photoelectric Plethysmograph (PPG) waveforms and toe systolic pressure readings are included as required and additional duplex testing as needed. Limited examinations for reoccurring indications may be performed as noted.  ABI Findings: +--------+------------------+-----+--------+--------+  Right    Rt Pressure (mmHg) Index Waveform Comment   +--------+------------------+-----+--------+--------+  Brachial 129                                         +--------+------------------+-----+--------+--------+  ATA                                        Rt BKA    +--------+------------------+-----+--------+--------+ +---------+------------------+-----+----------+-------+  Left      Lt Pressure (mmHg) Index Waveform   Comment  +---------+------------------+-----+----------+-------+  Brachial  135                                          +---------+------------------+-----+----------+-------+  ATA       85                       monophasic .63      +---------+------------------+-----+----------+-------+  PTA       84                 0.62  monophasic          +---------+------------------+-----+----------+-------+  Great Toe 0                  0.00  Absent              +---------+------------------+-----+----------+-------+ +-------+-----------+-----------+------------+------------+  ABI/TBI Today's ABI Today's TBI Previous ABI Previous TBI  +-------+-----------+-----------+------------+------------+   Right   Rt BKA                                             +-------+-----------+-----------+------------+------------+  Left    .63         0           1.10         .74           +-------+-----------+-----------+------------+------------+ Left ABIs and TBIs appear decreased compared to prior study on 04/23/2020.  Summary: Right: Rt BKA. Left: Resting left ankle-brachial index indicates moderate left lower extremity arterial disease. The left toe-brachial index is abnormal.  *See table(s) above for measurements and observations.  Electronically signed by Hortencia Pilar MD on 05/13/2021 at 4:28:02 PM.    Final      Assessment/Plan 1. Atherosclerosis of native arteries of the extremities with ulceration ([place])  Recommend:   The patient has evidence of severe atherosclerotic changes of the the left lower extremity associated with ulceration and tissue loss of the left foot.  The ischemic changes present in the plantar surface suggest that any transmetatarsal or Chopart type amputation would not heal.  However, his gangrenous toes dry and the areas of his plantar surface are patchy and therefore I do not see any emergent need to move forward with below-knee amputation.  Ultimately I think there is a high probability that this will progress to amputation.  Given that he is already below the knee amputation on the other side but was still living independently I would like to observe his foot for a few days more to see if it further demarcates.  I contacted his wife by phone and discussed this with her partner.  In the interest of trying everything to save his foot she feels this is a reasonable plan but understands that he may yet proceed to amputation.   2. Type 2 diabetes mellitus with diabetic peripheral angiopathy and gangrene, with long-term current use of insulin (HCC) Continue hypoglycemic medications as already ordered, these medications have been reviewed and there are no changes at this time.   Hgb  A1C to be monitored as already arranged by primary service    3. Essential hypertension Continue antihypertensive medications as already ordered, these medications have been reviewed and there are no changes at this time.    4. Familial hypercholesterolemia Continue statin as ordered and reviewed, no changes at this time    Hortencia Pilar, MD  05/27/2021 7:26 PM

## 2021-05-27 NOTE — Progress Notes (Signed)
°  Progress Note   Patient: Jose Schroeder:[id number] DOB: 06-12-1932 DOA: 05/24/2021     3 DOS: the patient was seen and examined on 05/27/2021    Assessment and Plan: * Sepsis (HCC)- (present on admission) Clinical sepsis, present on admission with left second toe gangrene with progression on to the bottom of the foot and top of the foot.  Currently on vancomycin and Zosyn.  Likely will end up needing an amputation.  Case discussed with podiatry.  Dr. Ether Griffins did discuss case with Dr. Gilda Crease who will reevaluate to determine whether the patient needs a BKA or not  Dementia without behavioral disturbance [facility]) Patient more cooperative today because I saw him while his daughter was in the room.  Peripheral vascular disease [facility]) Patient had an angiogram by Dr. Gilda Crease on 05/25/2021 with angioplasties and 2 stents placed.  Patient on Plavix and Lipitor  Diabetes mellitus (HCC) Type 2 diabetes mellitus.  Most recent hemoglobin A1c elevated 8.7 on 01/12/2021.  Patient on low-dose Lantus insulin.  Continue sliding scale.  Essential hypertension- (present on admission) Continue metoprolol       Subjective: Patient offers no complaints.  No chest pain or shortness of breath.  Came in with gangrenous toe which is now spreading to the foot upper and lower  Physical Exam: Vitals:   05/27/21 0631 05/27/21 0729 05/27/21 1125 05/27/21 1615  BP: (!) 132/56 (!) 145/62 132/65 135/63  Pulse: 94 94 87 97  Resp: 16 15 16 17   Temp: 98.2 F (36.8 C) 98.7 F (37.1 C) 98.5 F (36.9 C) 98.6 F (37 C)  TempSrc:      SpO2: 98% 100% 100% 100%  Weight:      Height:        Physical Exam HENT:     Head: Normocephalic.     Mouth/Throat:     Pharynx: No oropharyngeal exudate.  Eyes:     General: Lids are normal.     Conjunctiva/sclera: Conjunctivae normal.  Cardiovascular:     Rate and Rhythm: Normal rate and regular rhythm.     Heart sounds: Normal heart sounds, S1 normal and S2 normal.   Pulmonary:     Breath sounds: Normal breath sounds. No decreased breath sounds, wheezing, rhonchi or rales.  Abdominal:     Palpations: Abdomen is soft.     Tenderness: There is no abdominal tenderness.  Musculoskeletal:     Left lower leg: No swelling.  Skin:    Comments: Left second toe gangrene extending into the bottom and top of the foot.  Neurological:     Mental Status: He is alert.     Comments: Answer some simple questions.       Family Communication: spoke with daughter at the bedside  Case discussed from podiatrist  Disposition: Status is: Inpatient Remains inpatient appropriate because: he will need amputation  Planned Discharge Destination: Rehab  Author: , MD 05/27/2021 4:26 PM  For on call review [email or web address].

## 2021-05-27 NOTE — Progress Notes (Signed)
°  Progress Note   Date: 05/26/2021  Patient Name: Jose Schroeder        MRN#: [id number]   Clarification of diagnosis: Encephalopathy documented in H+P on 05/24/2021  The patient has dementia.

## 2021-05-27 NOTE — Progress Notes (Signed)
PIV consult: Placed long 22g in R forearm with US guidance. Secured with Stat-lock. Pt will need frequent reminders to keep PIV in place. This is the sixth PIV during his 3 day stay. Discussed with RN.

## 2021-05-27 NOTE — Evaluation (Signed)
Physical Therapy Evaluation Patient Details Name: Jose Schroeder MRN: [id number] DOB: 10-Nov-1932 Today's Date: 05/27/2021  History of Present Illness  88 y.o. male with medical history significant for dementia, uncontrolled DM, hTN, PAD, right BKA, presenting with altered mental status.  Patient was diagnosed with a right foot infection with gangrene, sepsis.  Vascular has attempted revascualrization to salvage limb,but now planning for L BKA.  Clinical Impression  Pt pleasantly confused but with some extra cuing and encouragement he was able to participate with some limited activity.  Pt too confused to make attempting transfer to recliner unsafe today, though he showed good confidence with getting to sitting from supine as well as relatively strong U&LEs.  Pt apparently having L BKA soon and this will, inherently, cause his mobility and function to change dramatically.  He apparently has been relatively mobile with R prosthetic and cane but once L LE in gone he will need to focus on transfers and w/c management - given his current mental status this could prove challenging; that being said with extra time and reinforcement he did participate well with basic tasks during PT exam today.     Recommendations for follow up therapy are one component of a multi-disciplinary discharge planning process, led by the attending physician.  Recommendations may be updated based on patient status, additional functional criteria and insurance authorization.  Follow Up Recommendations Skilled nursing-short term rehab (<3 hours/day)    Assistance Recommended at Discharge Frequent or constant Supervision/Assistance  Patient can return home with the following  Two people to help with walking and/or transfers;Two people to help with bathing/dressing/bathroom;Assistance with cooking/housework;Direct supervision/assist for medications management;Direct supervision/assist for financial management;Assist for  transportation;Help with stairs or ramp for entrance    Equipment Recommendations Rolling walker (2 wheels)  Recommendations for Other Services       Functional Status Assessment Patient has had a recent decline in their functional status and/or demonstrates limited ability to make significant improvements in function in a reasonable and predictable amount of time     Precautions / Restrictions Precautions Precautions: Fall Restrictions Weight Bearing Restrictions: No      Mobility  Bed Mobility Overal bed mobility: Modified Independent             General bed mobility comments: Pt needed repeated cuing to initiate transition to sitting, but was able to independently sit up and turn trunk to get to sitting w/o any phyiscal assist.    Transfers                   General transfer comment: deferred due to signficant confusion/lack of awareness as well as pt not having R prosthetic at hosptial along with L gangrenous foot.  Will need to work on slide/pivot transfers when more alert    Ambulation/Gait                  Information systems manager Rankin (Stroke Patients Only)       Balance Overall balance assessment: Needs assistance   Sitting balance-Leahy Scale: Good         Standing balance comment: not appropriate for standing today, will likely struggle with any standing                             Pertinent Vitals/Pain Pain Assessment Pain Assessment: No/denies pain    Home Living  Family/patient expects to be discharged to:: Unsure                   Additional Comments: pt too confused to answer; prior documentation states lives with wife, he responds "Yes, but I can't remember their name" when asked if he lives with anyone    Prior Function               Mobility Comments: pt too confused to answer questions, prior documentation indicates that ~4 months ago he was walking with cane  and R BKA prosthetic       Hand Dominance        Extremity/Trunk Assessment   Upper Extremity Assessment Upper Extremity Assessment: Overall WFL for tasks assessed    Lower Extremity Assessment Lower Extremity Assessment: Overall WFL for tasks assessed;Difficult to assess due to impaired cognition (deferred L ankle/foot testing, but had AROM against gravity and, when able to follow cuing, showed functional strength with testing)       Communication   Communication: No difficulties  Cognition Arousal/Alertness: Awake/alert Behavior During Therapy: Restless Overall Cognitive Status: No family/caregiver present to determine baseline cognitive functioning                                 General Comments: Pt with baseline dementia, pt only oriented to self today - appears he is likely more confused than his baseline        General Comments      Exercises     Assessment/Plan    PT Assessment Patient needs continued PT services  PT Problem List Decreased activity tolerance       PT Treatment Interventions Functional mobility training;Therapeutic activities;Therapeutic exercise;Balance training;DME instruction;Neuromuscular re-education;Patient/family education;Cognitive remediation    PT Goals (Current goals can be found in the Care Plan section)  Acute Rehab PT Goals Patient Stated Goal: none stated PT Goal Formulation: Patient unable to participate in goal setting Time For Goal Achievement: 06/10/21 Potential to Achieve Goals: Fair    Frequency Min 2X/week     Co-evaluation               AM-PAC PT "6 Clicks" Mobility  Outcome Measure Help needed turning from your back to your side while in a flat bed without using bedrails?: None Help needed moving from lying on your back to sitting on the side of a flat bed without using bedrails?: None Help needed moving to and from a bed to a chair (including a wheelchair)?: A Lot Help needed standing  up from a chair using your arms (e.g., wheelchair or bedside chair)?: Total Help needed to walk in hospital room?: Total Help needed climbing 3-5 steps with a railing? : Total 6 Click Score: 13    End of Session   Activity Tolerance: Patient tolerated treatment well (limited due to mental status) Patient left: with bed alarm set;with call bell/phone within reach Nurse Communication: Mobility status PT Visit Diagnosis: Muscle weakness (generalized) (M62.81);Difficulty in walking, not elsewhere classified (R26.2)    Time: 8938-1017 PT Time Calculation (min) (ACUTE ONLY): 11 min   Charges:   PT Evaluation $PT Eval Low Complexity: 1 Low          Malachi Pro, DPT 05/27/2021, 3:20 PM

## 2021-05-28 LAB — AEROBIC/ANAEROBIC CULTURE W GRAM STAIN (SURGICAL/DEEP WOUND): Gram Stain: NONE SEEN

## 2021-05-28 LAB — CREATININE, SERUM
Creatinine, Ser: 0.79 mg/dL (ref 0.61–1.24)
GFR, Estimated: >60 mL/min (ref >60)

## 2021-05-28 LAB — GLUCOSE, CAPILLARY
Glucose-Capillary: 128 mg/dL — ABNORMAL HIGH (ref 70–99)
Glucose-Capillary: 187 mg/dL — ABNORMAL HIGH (ref 70–99)
Glucose-Capillary: 193 mg/dL — ABNORMAL HIGH (ref 70–99)
Glucose-Capillary: 220 mg/dL — ABNORMAL HIGH (ref 70–99)

## 2021-05-28 MED ORDER — SODIUM CHLORIDE 0.9 % IV SOLN
2.0000 g | INTRAVENOUS | Status: DC
  Administered 2021-05-28 – 2021-05-29 (×2): 2 g via INTRAVENOUS
  Filled 2021-05-28 (×2): qty 20
  Filled 2021-05-28: qty 2

## 2021-05-28 NOTE — Progress Notes (Signed)
[facility] ° °Daily Progress Note ° ° °Subjective  - 3 Days Post-Op ° °Patient has eat supper denies left foot pain ° °Objective °Vitals:  ° 05/28/21 0617 05/28/21 0818 05/28/21 1133 05/28/21 1533  °BP: (!) 149/76 (!) 146/71 136/72 (!) 153/85  °Pulse: 91 92 84 96  °Resp: 16 16 16 16  °Temp: 98.4 °F (36.9 °C) 98.8 °F (37.1 °C) 98.4 °F (36.9 °C) 97.9 °F (36.6 °C)  °TempSrc:      °SpO2: 100% 99% 100% 100%  °Weight:      °Height:      ° ° °Intake/Output Summary (Last 24 hours) at 05/28/2021 1930 °Last data filed at 05/28/2021 1507 °Gross per 24 hour  °Intake 1434.39 ml  °Output 0 ml  °Net 1434.39 ml  ° ° °PULM  Normal effort , no use of accessory muscles °CV  No JVD, RRR °Abd      No distended, nontender °VASC  The left foot is much worse, dry gangrene now extends in a triangular shape along the plantar surface to the heel ° °Laboratory °CBC °   °Component Value Date/Time  ° WBC 17.5 (H) 05/26/2021 0344  ° HGB 8.1 (L) 05/26/2021 0344  ° HGB 10.3 (L) 02/19/2021 1538  ° HCT 25.6 (L) 05/26/2021 0344  ° HCT 31.0 (L) 02/19/2021 1538  ° PLT 469 (H) 05/26/2021 0344  ° PLT 322 02/19/2021 1538  ° ° °BMET °   °Component Value Date/Time  ° NA 139 05/26/2021 0344  ° NA 140 02/19/2021 1538  ° NA 141 11/12/2012 0717  ° K 3.7 05/26/2021 0344  ° K 4.0 11/12/2012 0717  ° CL 100 05/26/2021 0344  ° CL 106 11/12/2012 0717  ° CO2 30 05/26/2021 0344  ° CO2 31 11/12/2012 0717  ° GLUCOSE 130 (H) 05/26/2021 0344  ° GLUCOSE 149 (H) 11/12/2012 0717  ° BUN 12 05/26/2021 0344  ° BUN 9 02/19/2021 1538  ° BUN 10 11/12/2012 0717  ° CREATININE 0.79 05/28/2021 0900  ° CREATININE 0.99 11/12/2012 0717  ° CALCIUM 8.8 (L) 05/26/2021 0344  ° CALCIUM 9.4 11/12/2012 0717  ° GFRNONAA >60 05/28/2021 0900  ° GFRNONAA >60 11/12/2012 0717  ° GFRAA 90 10/01/2019 1550  ° GFRAA >60 11/12/2012 0717  ° ° °Assessment/Planning: °POD #3 s/p PTA and stent placement left popliteal and SFA ° °Today it is clear that the foot is not salvagable °I will plan  left BKA this Tues or Wed ° ° °Keiron Iodice ° °05/28/2021, 7:30 PM ° ° ° °  °

## 2021-05-28 NOTE — Care Management Important Message (Signed)
Important Message  Patient Details  Name: Jose Schroeder MRN: [id number] Date of Birth: 29-Jun-1932   Medicare Important Message Given:  Yes     Olegario Messier A Kalob Bergen 05/28/2021, 12:01 PM

## 2021-05-28 NOTE — H&P (View-Only) (Signed)
[facility]  Daily Progress Note   Subjective  - 3 Days Post-Op  Patient has eat supper denies left foot pain  Objective Vitals:   05/28/21 0617 05/28/21 0818 05/28/21 1133 05/28/21 1533  BP: (!) 149/76 (!) 146/71 136/72 (!) 153/85  Pulse: 91 92 84 96  Resp: 16 16 16 16   Temp: 98.4 F (36.9 C) 98.8 F (37.1 C) 98.4 F (36.9 C) 97.9 F (36.6 C)  TempSrc:      SpO2: 100% 99% 100% 100%  Weight:      Height:        Intake/Output Summary (Last 24 hours) at 05/28/2021 1930 Last data filed at 05/28/2021 1507 Gross per 24 hour  Intake 1434.39 ml  Output 0 ml  Net 1434.39 ml    PULM  Normal effort , no use of accessory muscles CV  No JVD, RRR Abd      No distended, nontender VASC  The left foot is much worse, dry gangrene now extends in a triangular shape along the plantar surface to the heel  Laboratory CBC    Component Value Date/Time   WBC 17.5 (H) 05/26/2021 0344   HGB 8.1 (L) 05/26/2021 0344   HGB 10.3 (L) 02/19/2021 1538   HCT 25.6 (L) 05/26/2021 0344   HCT 31.0 (L) 02/19/2021 1538   PLT 469 (H) 05/26/2021 0344   PLT 322 02/19/2021 1538    BMET    Component Value Date/Time   NA 139 05/26/2021 0344   NA 140 02/19/2021 1538   NA 141 11/12/2012 0717   K 3.7 05/26/2021 0344   K 4.0 11/12/2012 0717   CL 100 05/26/2021 0344   CL 106 11/12/2012 0717   CO2 30 05/26/2021 0344   CO2 31 11/12/2012 0717   GLUCOSE 130 (H) 05/26/2021 0344   GLUCOSE 149 (H) 11/12/2012 0717   BUN 12 05/26/2021 0344   BUN 9 02/19/2021 1538   BUN 10 11/12/2012 0717   CREATININE 0.79 05/28/2021 0900   CREATININE 0.99 11/12/2012 0717   CALCIUM 8.8 (L) 05/26/2021 0344   CALCIUM 9.4 11/12/2012 0717   GFRNONAA >60 05/28/2021 0900   GFRNONAA >60 11/12/2012 0717   GFRAA 90 10/01/2019 1550   GFRAA >60 11/12/2012 0717    Assessment/Planning: POD #3 s/p PTA and stent placement left popliteal and SFA  Today it is clear that the foot is not salvagable I will plan  left BKA this Tues or Wed   Hortencia Pilar  05/28/2021, 7:30 PM

## 2021-05-28 NOTE — Consult Note (Signed)
Pharmacy Antibiotic Note  Jose Schroeder is a 88 y.o. male admitted on 05/24/2021 with  diabetic foot infection .  Pharmacy has been consulted for Vancomycin and Zosyn dosing.  Plan: Vancomycin  LD of 1500 mg, followed by  Vancomycin 1500 mg Q24H Goal AUC 400-550 Expected AUC: 523 SCr used: 0.8 (actual 0.76)  Pt also on ceftriaxone 2 g IV q24h   Height: 5\' 10"  (177.8 cm) Weight: 65 kg (143 lb 4.8 oz) IBW/kg (Calculated) : 73  Temp (24hrs), Avg:98.4 F (36.9 C), Min:97.9 F (36.6 C), Max:98.8 F (37.1 C)  Recent Labs  Lab 05/24/21 1252 05/25/21 1532 05/26/21 0344 05/27/21 0352 05/28/21 0900  WBC 23.5*  --  17.5*  --   --   CREATININE 1.18 0.76 0.74 0.76 0.79  LATICACIDVEN 1.5 1.4  --   --   --      Estimated Creatinine Clearance: 60.9 mL/min (by C-G formula based on SCr of 0.79 mg/dL).    No Known Allergies  Antimicrobials this admission: 1/30 Zosyn >> 2/3 1/30 Vancomycin >>  2/3 ceftriaxone >>   Dose adjustments this admission:   Microbiology results: 1/30 BCx: NG x 4 days 1/30 UCx: NG  1/30 wound cx: Few GNRs and abundant GPCs   Thank you for allowing pharmacy to be a part of this patients care.  Darnelle Bos, PharmD  05/28/2021 4:04 PM

## 2021-05-28 NOTE — Progress Notes (Signed)
°  Progress Note   Patient: Jose Schroeder WCH:[id number] DOB: 05/15/1932 DOA: 05/24/2021     4 DOS: the patient was seen and examined on 05/28/2021     Assessment and Plan: * Sepsis (HCC)- (present on admission) Clinical sepsis, present on admission with left second toe gangrene with progression on to the bottom of the foot and top of the foot.  Continue vancomycin.  Change Zosyn over to Rocephin.  Dr. Gilda Crease vascular surgery wanted to watch the foot demarcate for a few more days prior to deciding on level of amputation.  Dementia without behavioral disturbance (HCC) No behavioral disturbance today and actually participated well with conversation today.  Peripheral vascular disease [facility]) Patient had an angiogram by Dr. Gilda Crease on 05/25/2021 with angioplasties and 2 stents placed.  Patient on Plavix and Lipitor.  Diabetes mellitus (HCC) Type 2 diabetes mellitus.  Most recent hemoglobin A1c elevated 8.7 on 01/12/2021.  Patient on low-dose Lantus insulin.  Continue sliding scale.  This morning sugar good at 128.  This afternoon a little bit higher at 187  Essential hypertension- (present on admission) Continue metoprolol        Subjective: Patient feels okay and offers no complaints.  No pain in his left foot.  Admitted with sepsis and gangrene of the left second toe.  Physical Exam: Vitals:   05/27/21 2116 05/28/21 0617 05/28/21 0818 05/28/21 1133  BP: 136/70 (!) 149/76 (!) 146/71 136/72  Pulse: (!) 105 91 92 84  Resp: 18 16 16 16   Temp: 98.4 F (36.9 C) 98.4 F (36.9 C) 98.8 F (37.1 C) 98.4 F (36.9 C)  TempSrc:      SpO2: 100% 100% 99% 100%  Weight:      Height:       Physical Exam HENT:     Head: Normocephalic.     Mouth/Throat:     Pharynx: No oropharyngeal exudate.  Eyes:     General: Lids are normal.     Conjunctiva/sclera: Conjunctivae normal.  Cardiovascular:     Rate and Rhythm: Normal rate and regular rhythm.     Heart sounds: Normal heart sounds, S1  normal and S2 normal.  Pulmonary:     Breath sounds: Normal breath sounds. No decreased breath sounds, wheezing, rhonchi or rales.  Abdominal:     Palpations: Abdomen is soft.     Tenderness: There is no abdominal tenderness.  Musculoskeletal:     Left lower leg: No swelling.  Skin:    Comments: Left foot wrapped.  Neurological:     Mental Status: He is alert.     Comments: Answer some simple questions.     Data Reviewed: Reviewed kidney function from today which is stable over the last few days.  Reviewed notes from specialist from last night.  Family Communication: Updated patient's daughter on the phone  Disposition: Status is: Inpatient Remains inpatient appropriate because: Patient will likely need an amputation and vascular surgery is waiting another few days to see where the foot demarcates to decide on level of amputation.  Planned Discharge Destination: Skilled nursing facility Patient may end up needing another week here in the hospital in watching the foot a few more days than an amputation within a few days of recovery prior to going out to rehab.  Author: , MD 05/28/2021 1:04 PM  For on call review [email or web address].

## 2021-05-28 NOTE — TOC Progression Note (Addendum)
Transition of Care [facility]) - Progression Note    Patient Details  Name: Jose Schroeder MRN: [id number] Date of Birth: 1932/05/24  [facility] CM/SW Contact  Marlowe Sax, RN Phone Number: 05/28/2021, 10:07 AM  Clinical Narrative:   Spoke with the patient's wife Jose Schroeder, She stated that she was told by a SW from [facility] that if he went to [facility] SNF they would take his check, But long term would not.  I explained to her to go to Long term nursing facility he would have to have either Medicaid or long term insurance, she stated that he doe snot have either, I explained that to go to Long term nursing then it would have to be paid out of pocket, she stated that is not possible, I explained that [facility] SNF doe snot take his SS check unless he is there thru medicaid, she restated that he doe snot have medicaid, I explained with Medicare or UHC Medicare that [facility] SNF doe snot take his SS check, I explained that after 20 days  from day 21-100 there is a copay, She stated that she wants to make sure that the foot is better and after they decide what to do with hs foot then she is agreeable to go to [facility], I explained how the bedsearch process works, she is agreeable to a bedsearch once they determine what they will do with his foot.  PASSR number obtained [id number], FL2 started, will need completed once determined if has surgery, TO will follow and assist         Expected Discharge Plan and Services                                                 Social Determinants of Health (SDOH) Interventions    Readmission Risk Interventions No flowsheet data found.

## 2021-05-28 NOTE — Progress Notes (Signed)
Physical Therapy Treatment Patient Details Name: Jose Schroeder MRN: [id number] DOB: [birth year] Today's Date: 05/28/2021   History of Present Illness 88 y.o. male with medical history significant for dementia, uncontrolled DM, hTN, PAD, right BKA, presenting with altered mental status.  Patient was diagnosed with a right foot infection with gangrene, sepsis.  Vascular has attempted revascualrization to salvage limb,but now planning for L BKA.    PT Comments    Patient tolerated session fairly well and was agreeable to treatment. Patient continue to only be oriented to self/DOB. Patient inconsistently follows one step commands, and needs Max cueing on task and re-orientation. He kept repeating throughout session "I just need to move on, I need to get home to take care of some stuff". Prior level of function and home set up continue to be difficult to assess due to patient's limited cognition. Bed mobility continues to be completed with significant ease at Mod I (unilateral UE support to assist into sitting). Balance sitting EOB is good with patient able to complete LAQ and seated marches with BUE support and no LOB noted. Patient would continue to benefit from skilled physical therapy to optimize return to PLOF. Continue to recommend STR upon discharge from acute hospitalization.   Recommendations for follow up therapy are one component of a multi-disciplinary discharge planning process, led by the attending physician.  Recommendations may be updated based on patient status, additional functional criteria and insurance authorization.  Follow Up Recommendations  Skilled nursing-short term rehab (<3 hours/day)     Assistance Recommended at Discharge Frequent or constant Supervision/Assistance  Patient can return home with the following Two people to help with walking and/or transfers;Two people to help with bathing/dressing/bathroom;Assistance with cooking/housework;Direct supervision/assist for  medications management;Direct supervision/assist for financial management;Assist for transportation;Help with stairs or ramp for entrance   Equipment Recommendations  Other (comment) (determine at next level of care)    Recommendations for Other Services       Precautions / Restrictions Precautions Precautions: Fall Restrictions Weight Bearing Restrictions: No     Mobility  Bed Mobility Overal bed mobility: Modified Independent             General bed mobility comments: Pt needed repeated cuing to initiate transition to sitting, but was able to independently sit up and turn trunk to get to sitting w/o any phyiscal assist.    Transfers                   General transfer comment: deferred due to signficant confusion/lack of awareness as well as pt not having R prosthetic at hosptial along with L gangrenous foot.  Will need to work on slide/pivot transfers when more alert    Ambulation/Gait                   Social research officer, government Rankin (Stroke Patients Only)       Balance Overall balance assessment: Needs assistance Sitting-balance support: Bilateral upper extremity supported, Feet unsupported Sitting balance-Leahy Scale: Good         Standing balance comment: not appropriate for standing today, will likely struggle with any standing                            Cognition Arousal/Alertness: Awake/alert Behavior During Therapy: Restless Overall Cognitive Status: No family/caregiver present to determine baseline cognitive functioning  General Comments: Pt with baseline dementia, pt only oriented to self today, unoriented to situation, location, and year (19-"i don't know")        Exercises Other Exercises Other Exercises: LAQ X10 bilaterally; supervision with verbal cueing Other Exercises: seated marches x10 bilaterally at supervision    General  Comments        Pertinent Vitals/Pain Pain Assessment Pain Assessment: No/denies pain    Home Living                          Prior Function            PT Goals (current goals can now be found in the care plan section) Acute Rehab PT Goals Patient Stated Goal: none stated PT Goal Formulation: Patient unable to participate in goal setting Time For Goal Achievement: 06/10/21 Potential to Achieve Goals: Fair Progress towards PT goals:  (patient unable to state goal)    Frequency    Min 2X/week      PT Plan Current plan remains appropriate    Co-evaluation              AM-PAC PT "6 Clicks" Mobility   Outcome Measure  Help needed turning from your back to your side while in a flat bed without using bedrails?: None Help needed moving from lying on your back to sitting on the side of a flat bed without using bedrails?: None Help needed moving to and from a bed to a chair (including a wheelchair)?: A Lot Help needed standing up from a chair using your arms (e.g., wheelchair or bedside chair)?: Total Help needed to walk in hospital room?: Total Help needed climbing 3-5 steps with a railing? : Total 6 Click Score: 13    End of Session   Activity Tolerance: Patient tolerated treatment well (limited due to mental status) Patient left: with bed alarm set;with call bell/phone within reach Nurse Communication: Mobility status PT Visit Diagnosis: Muscle weakness (generalized) (M62.81);Difficulty in walking, not elsewhere classified (R26.2)     Time: 6384-5364 PT Time Calculation (min) (ACUTE ONLY): 13 min  Charges:  $Therapeutic Activity: 8-22 mins                     Angelica Ran, PT  05/28/21. 10:37 AM

## 2021-05-29 LAB — CULTURE, BLOOD (ROUTINE X 2)
Culture: NO GROWTH
Culture: NO GROWTH
Special Requests: ADEQUATE

## 2021-05-29 LAB — GLUCOSE, CAPILLARY
Glucose-Capillary: 123 mg/dL — ABNORMAL HIGH (ref 70–99)
Glucose-Capillary: 183 mg/dL — ABNORMAL HIGH (ref 70–99)
Glucose-Capillary: 289 mg/dL — ABNORMAL HIGH (ref 70–99)
Glucose-Capillary: 227 mg/dL — ABNORMAL HIGH (ref 70–99)

## 2021-05-29 MED ORDER — ACETAMINOPHEN 325 MG PO TABS
650.0000 mg | ORAL_TABLET | ORAL | Status: DC | PRN
  Administered 2021-05-29 – 2021-06-02 (×2): 650 mg via ORAL
  Filled 2021-05-29 (×2): qty 2

## 2021-05-29 MED ORDER — VANCOMYCIN HCL 1250 MG/250ML IV SOLN
1250.0000 mg | INTRAVENOUS | Status: DC
  Administered 2021-05-29 – 2021-05-30 (×2): 1250 mg via INTRAVENOUS
  Filled 2021-05-29 (×3): qty 250

## 2021-05-29 NOTE — Consult Note (Signed)
Pharmacy Antibiotic Note  Jose Schroeder is a 88 y.o. male admitted on 05/24/2021 with left second toe gangrene  being followed by vascular surgery. Pharmacy has been consulted for vancomycin dosing.  Plan: adjust vancomycin dose to 1250 mg IV Q24H Goal AUC 400-550 Expected AUC: 485.8 SCr used: 0.8 mg/dL (rounded up) Ke: 5.809 h-1, T1/2: 12.6 h Daily SCr while on IV vancomycin  Height: 5\' 10"  (177.8 cm) Weight: 65 kg (143 lb 4.8 oz) IBW/kg (Calculated) : 73  Temp (24hrs), Avg:98.4 F (36.9 C), Min:97.9 F (36.6 C), Max:98.8 F (37.1 C)  Recent Labs  Lab 05/24/21 1252 05/25/21 1532 05/26/21 0344 05/27/21 0352 05/28/21 0900  WBC 23.5*  --  17.5*  --   --   CREATININE 1.18 0.76 0.74 0.76 0.79  LATICACIDVEN 1.5 1.4  --   --   --      Estimated Creatinine Clearance: 60.9 mL/min (by C-G formula based on SCr of 0.79 mg/dL).    No Known Allergies  Antimicrobials this admission: 1/30 Zosyn >> 2/3 1/30 vancomycin >>  2/03 ceftriaxone >>   Microbiology results: 1/30 BCx: NG final 1/30 UCx: NG final 1/30 wound Cx: Few GNRs (P melaninogenica) and abundant GPCs   Thank you for allowing pharmacy to be a part of this patients care.  2/30, PharmD  05/29/2021 9:40 AM

## 2021-05-29 NOTE — [place] FL2 (Signed)
[place] MEDICAID FL2 LEVEL OF CARE SCREENING TOOL     IDENTIFICATION  Patient Name: Jose Schroeder Birthdate: August 12, 1932 Sex: male Admission Date (Current Location): 05/24/2021  [place] and [place] Number:  Chiropodist and Address:  [facility], [address] [place], [place] [address]      Provider Number: [id number]  Attending Physician Name and Address:  Alford Highland, MD  Relative Name and Phone Number:  Doylene Bode [phone]    Current Level of Care: Hospital Recommended Level of Care: Skilled Nursing Facility Prior Approval Number:    Date Approved/Denied:   PASRR Number: [id number]  Discharge Plan: SNF    Current Diagnoses: Patient Active Problem List   Diagnosis Date Noted   Dementia without behavioral disturbance (HCC) 05/26/2021   Diabetic foot infection (HCC) 05/24/2021   Toe gangrene (HCC) 05/24/2021   Sepsis (HCC) 02/10/2021   Delirium 02/10/2021   Hyponatremia 02/10/2021   Atherosclerosis of native arteries of extremity with intermittent claudication (HCC) 03/30/2020   BKA stump complication (HCC) 03/30/2020   Anemia 10/01/2019   Familial hypercholesterolemia 10/01/2019   Acquired absence of right leg below knee (HCC) 09/13/2018   Amputated below knee, right (HCC) 05/09/2017   Peripheral vascular disease (HCC) 12/15/2016   Ulcer of right foot (HCC) 01/31/2016   Amputated great toe (HCC) 01/11/2016   Toe infection 01/04/2016   Diabetes mellitus (HCC) 09/24/2015   Essential hypertension 09/24/2015    Orientation RESPIRATION BLADDER Height & Weight     Self  Normal External catheter, Continent Weight: 143 lb 4.8 oz (65 kg) Height:  5\' 10"  (177.8 cm)  BEHAVIORAL SYMPTOMS/MOOD NEUROLOGICAL BOWEL NUTRITION STATUS      Continent Diet (carb modified)  AMBULATORY STATUS COMMUNICATION OF NEEDS Skin   Extensive Assist Verbally                         Personal Care Assistance Level of  Assistance  Bathing, Dressing Bathing Assistance: Maximum assistance   Dressing Assistance: Maximum assistance     Functional Limitations Info  Hearing   Hearing Info: Impaired      SPECIAL CARE FACTORS FREQUENCY  PT (By licensed PT), OT (By licensed OT)     PT Frequency: 5 times per week OT Frequency: 5 times per week            Contractures Contractures Info: Not present    Additional Factors Info  Code Status, Allergies, Insulin Sliding Scale (CBG 70 - 120: 0 units   CBG 121 - 150: 2 units   CBG 151 - 200: 3 units   CBG 201 - 250: 5 units   CBG 251 - 300: 8 units   CBG 301 - 350: 11 units   CBG 351 - 400: 15 units) Code Status Info: full code Allergies Info: NKDA           Current Medications (05/29/2021):  This is the current hospital active medication list Current Facility-Administered Medications  Medication Dose Route Frequency Provider Last Rate Last Admin   0.9 %  sodium chloride infusion  250 mL Intravenous PRN Schnier, 07/27/2021, MD   Stopped at 05/25/21 2000   0.9 %  sodium chloride infusion  250 mL Intravenous PRN Schnier, 05/27/21, MD       acetaminophen (TYLENOL) tablet 650 mg  650 mg Oral Q4H PRN Schnier, Latina Craver, MD       atorvastatin (LIPITOR) tablet 20 mg  20 mg  Oral QPM Schnier, Latina Craver, MD   20 mg at 05/28/21 1639   cefTRIAXone (ROCEPHIN) 2 g in sodium chloride 0.9 % 100 mL IVPB  2 g Intravenous Q24H Alford Highland, MD   Stopped at 05/29/21 0001   clopidogrel (PLAVIX) tablet 75 mg  75 mg Oral Q breakfast Schnier, Latina Craver, MD   75 mg at 05/29/21 0915   hydrALAZINE (APRESOLINE) injection 5 mg  5 mg Intravenous Q20 Min PRN Schnier, Latina Craver, MD       insulin aspart (novoLOG) injection 0-15 Units  0-15 Units Subcutaneous TID WC Schnier, Latina Craver, MD   3 Units at 05/29/21 1300   insulin aspart (novoLOG) injection 0-5 Units  0-5 Units Subcutaneous QHS Schnier, Latina Craver, MD   2 Units at 05/28/21 2241   insulin glargine-yfgn [facility] injection 6  Units  6 Units Subcutaneous QHS Alford Highland, MD   6 Units at 05/28/21 2241   iron polysaccharides (NIFEREX) capsule 150 mg  150 mg Oral Daily Marrion Coy, MD   150 mg at 05/29/21 0915   labetalol (NORMODYNE) injection 10 mg  10 mg Intravenous Q10 min PRN Schnier, Latina Craver, MD       metoprolol tartrate (LOPRESSOR) tablet 50 mg  50 mg Oral BID Schnier, Latina Craver, MD   50 mg at 05/29/21 0915   morphine 4 MG/ML injection 2 mg  2 mg Intravenous Q1H PRN Schnier, Latina Craver, MD       ondansetron [facility]) injection 4 mg  4 mg Intravenous Q6H PRN Schnier, Latina Craver, MD       oxyCODONE (Oxy IR/ROXICODONE) immediate release tablet 5-10 mg  5-10 mg Oral Q4H PRN Schnier, Latina Craver, MD       sodium chloride flush (NS) 0.9 % injection 3 mL  3 mL Intravenous Q12H Schnier, Latina Craver, MD   3 mL at 05/27/21 2204   sodium chloride flush (NS) 0.9 % injection 3 mL  3 mL Intravenous PRN Schnier, Latina Craver, MD       sodium chloride flush (NS) 0.9 % injection 3 mL  3 mL Intravenous Q12H Schnier, Latina Craver, MD   3 mL at 05/26/21 0852   sodium chloride flush (NS) 0.9 % injection 3 mL  3 mL Intravenous PRN Schnier, Latina Craver, MD       vancomycin (VANCOREADY) IVPB 1250 mg/250 mL  1,250 mg Intravenous Q24H Lowella Bandy, RPH 166.7 mL/hr at 05/29/21 1300 1,250 mg at 05/29/21 1300     Discharge Medications: Please see discharge summary for a list of discharge medications.  Relevant Imaging Results:  Relevant Lab Results:   Additional Information SS# [id number]  Maree Krabbe, LCSW

## 2021-05-29 NOTE — Progress Notes (Signed)
°  Progress Note   Patient: Jose Schroeder FAO:[id number] DOB: Jul 20, 1932 DOA: 05/24/2021     5 DOS: the patient was seen and examined on 05/29/2021   Assessment and Plan: * Sepsis (HCC)- (present on admission) Clinical sepsis, present on admission with left second toe gangrene with progression on to the bottom of the foot and top of the foot.  Area of progression seems a little larger today.  Continue vancomycin and Rocephin.  Dr. Gilda Crease vascular surgery wanted to watch the foot demarcate for a few more days prior to deciding on level of amputation.  Dementia without behavioral disturbance (HCC) Mental status improved from admission.  Peripheral vascular disease [facility]) Patient had an angiogram by Dr. Gilda Crease on 05/25/2021 with angioplasties and 2 stents placed.  Continue Plavix and Lipitor.  Diabetes mellitus (HCC) Type 2 diabetes mellitus.  Most recent hemoglobin A1c elevated 8.7 on 01/12/2021.  Patient on low-dose Lantus insulin.  Continue sliding scale.   Essential hypertension- (present on admission) Continue metoprolol      Subjective: Patient feels fine and offers no complaints.  Admitted with second toe gangrene and sepsis.  Physical Exam: Vitals:   05/29/21 0414 05/29/21 0729 05/29/21 1157 05/29/21 1222  BP: (!) 144/69 (!) 166/76 131/68 (!) 144/98  Pulse: 87 98 92 62  Resp: 15 15 16 16   Temp: 98.4 F (36.9 C) 98.8 F (37.1 C) 98.5 F (36.9 C)   TempSrc:      SpO2: 100% 100% 100% 98%  Weight:      Height:       Physical Exam HENT:     Head: Normocephalic.     Mouth/Throat:     Pharynx: No oropharyngeal exudate.  Eyes:     General: Lids are normal.     Conjunctiva/sclera: Conjunctivae normal.  Cardiovascular:     Rate and Rhythm: Normal rate and regular rhythm.     Heart sounds: Normal heart sounds, S1 normal and S2 normal.  Pulmonary:     Breath sounds: Normal breath sounds. No decreased breath sounds, wheezing, rhonchi or rales.  Abdominal:      Palpations: Abdomen is soft.     Tenderness: There is no abdominal tenderness.  Musculoskeletal:     Left lower leg: No swelling.  Skin:    General: Skin is warm.     Comments: See pictures below  Neurological:     Mental Status: He is alert.     Comments: Answer all questions appropriately.        Data Reviewed: Sugars from today 123 and 183  Family Communication: Updated patient's daughter on the phone at [phone]  Disposition: Status is: Inpatient Remains inpatient appropriate because: He will need an amputation prior to disposition.  Vascular surgery waiting for level to demarcate.  Planned Discharge Destination: Skilled nursing facility  Author: [phone], MD 05/29/2021 2:02 PM  For on call review [email or web address].

## 2021-05-30 DIAGNOSIS — D638 Anemia in other chronic diseases classified elsewhere: Secondary | ICD-10-CM

## 2021-05-30 DIAGNOSIS — I70262 Atherosclerosis of native arteries of extremities with gangrene, left leg: Secondary | ICD-10-CM

## 2021-05-30 LAB — CBC
HCT: 23.7 % — ABNORMAL LOW (ref 39.0–52.0)
Hemoglobin: 7.4 g/dL — ABNORMAL LOW (ref 13.0–17.0)
MCH: 28.6 pg (ref 26.0–34.0)
MCHC: 31.2 g/dL (ref 30.0–36.0)
MCV: 91.5 fL (ref 80.0–100.0)
Platelets: 469 10*3/uL — ABNORMAL HIGH (ref 150–400)
RBC: 2.59 MIL/uL — ABNORMAL LOW (ref 4.22–5.81)
RDW: 13.9 % (ref 11.5–15.5)
WBC: 17.2 10*3/uL — ABNORMAL HIGH (ref 4.0–10.5)
nRBC: 0 % (ref 0.0–0.2)

## 2021-05-30 LAB — GLUCOSE, CAPILLARY
Glucose-Capillary: 113 mg/dL — ABNORMAL HIGH (ref 70–99)
Glucose-Capillary: 148 mg/dL — ABNORMAL HIGH (ref 70–99)
Glucose-Capillary: 263 mg/dL — ABNORMAL HIGH (ref 70–99)
Glucose-Capillary: 208 mg/dL — ABNORMAL HIGH (ref 70–99)

## 2021-05-30 LAB — CREATININE, SERUM
Creatinine, Ser: 0.64 mg/dL (ref 0.61–1.24)
GFR, Estimated: >60 mL/min (ref >60)

## 2021-05-30 MED ORDER — SODIUM CHLORIDE 0.9 % IV SOLN
2.0000 g | INTRAVENOUS | Status: DC
  Administered 2021-05-31: 2 g via INTRAVENOUS
  Filled 2021-05-30: qty 20

## 2021-05-30 NOTE — Assessment & Plan Note (Addendum)
Today's hemoglobin is 8.0.  The patient received 2 units of packed red blood cells during the hospital course on 05/24/2021 and also on 05/31/2021.  Watch hemoglobin closely postoperatively

## 2021-05-30 NOTE — Progress Notes (Signed)
Subjective: Interval History: none..   Objective: Vital signs in last 24 hours: Temp:  [98 F (36.7 C)-101 F (38.3 C)] 98 F (36.7 C) (02/05 0806) Pulse Rate:  [62-105] 81 (02/05 0806) Resp:  [16-18] 16 (02/05 0806) BP: (110-174)/(57-98) 135/59 (02/05 0806) SpO2:  [98 %-100 %] 100 % (02/05 0806)  Intake/Output from previous day: 02/04 0701 - 02/05 0700 In: 250 [IV Piggyback:250] Out: 1000 [Urine:1000] Intake/Output this shift: No intake/output data recorded.  General appearance: alert, cooperative, and no distress Extremities: Well-healed right below the knee amputation.  Left foot with dry gangrenous changes and mummification of the first second and partial third toes with eschar along the plantar medial and dorsal aspect of the foot.  There is no purulence no induration no surrounding erythema no tenderness and no evidence of suppuration.   Lab Results: No results for input(s): WBC, HGB, HCT, PLT in the last 72 hours. 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  Status post percutaneous revascularization.  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  He is facing amputation but prospects for healing on the foot given the lack of distal perfusion is dismal.  He is likely best served by below the knee amputation.  There is no evidence of any ongoing infection at this time.  Plans for amputation are being made for the upcoming week.  I do not believe there is any clinical evidence that surgery needs to be performed more urgently.     LOS: 6 days   Verda Cumins 05/30/2021, 9:56 AM

## 2021-05-30 NOTE — Progress Notes (Signed)
°  Progress Note   Patient: Jose Schroeder DOB: 06-Nov-1932 DOA: 05/24/2021     6 DOS: the patient was seen and examined on 05/30/2021     Assessment and Plan: * Sepsis (HCC)- (present on admission) Clinical sepsis, present on admission with left second toe gangrene with progression on to the bottom of the foot and top of the foot.  Area of demarcation seems a little larger today extending into the foot and first toe stump.  Patient spiked a fever of 101 last night.  Blood cultures obtained.  Continue vancomycin and Rocephin.  I messaged the vascular surgeon on-call to evaluate the patient today.  Likely the patient will need a BKA this week.  Dementia without behavioral disturbance (HCC) Mental status worse today likely with fever and elevation of white blood cell count.  Continue antibiotics.  Peripheral vascular disease [facility]) Patient had an angiogram by Dr. Gilda Crease on 05/25/2021 with angioplasties and 2 stents placed.  Continue Plavix and Lipitor.  Diabetes mellitus (HCC) Type 2 diabetes mellitus.  Most recent hemoglobin A1c elevated 8.7 on 01/12/2021.  Patient on low-dose Lantus insulin.  Continue sliding scale.   Anemia of chronic disease Patient initially had a blood transfusion on a hemoglobin of 6.5.  Today's hemoglobin 7.4.  May end up needing another transfusion during the hospital course  Essential hypertension- (present on admission) Continue metoprolol        Subjective: Patient more confused today than yesterday.  Was eating breakfast with the aid.  Physical Exam: Vitals:   05/29/21 2300 05/30/21 0015 05/30/21 0806 05/30/21 1133  BP: (!) 124/57 110/60 (!) 135/59 (!) 155/71  Pulse: 77 76 81 84  Resp: 17 18 16 17   Temp: 98.7 F (37.1 C) 98.1 F (36.7 C) 98 F (36.7 C) 98.9 F (37.2 C)  TempSrc: Oral Oral    SpO2: 100% 100% 100% 100%  Weight:      Height:       Physical Exam HENT:     Head: Normocephalic.     Mouth/Throat:     Pharynx: No  oropharyngeal exudate.  Eyes:     General: Lids are normal.     Conjunctiva/sclera: Conjunctivae normal.  Cardiovascular:     Rate and Rhythm: Normal rate and regular rhythm.     Heart sounds: Normal heart sounds, S1 normal and S2 normal.  Pulmonary:     Breath sounds: Normal breath sounds. No decreased breath sounds, wheezing, rhonchi or rales.  Abdominal:     Palpations: Abdomen is soft.     Tenderness: There is no abdominal tenderness.  Musculoskeletal:     Left lower leg: No swelling.  Skin:    General: Skin is warm.     Comments: See pictures below  Neurological:     Mental Status: He is alert.     Comments: Answer all questions appropriately.        Data Reviewed:  Hemoglobin down to 7.4, white blood cell 17.2, platelet count 469  Family Communication: Left message for daughter  Disposition: Status is: Inpatient Remains inpatient appropriate because: Patient will need an amputation this week  Planned Discharge Destination: Rehab  Author: , MD 05/30/2021 1:05 PM  For on call review [email or web address].

## 2021-05-31 LAB — CBC
HCT: 21.1 % — ABNORMAL LOW (ref 39.0–52.0)
Hemoglobin: 6.6 g/dL — ABNORMAL LOW (ref 13.0–17.0)
MCH: 28.7 pg (ref 26.0–34.0)
MCHC: 31.3 g/dL (ref 30.0–36.0)
MCV: 91.7 fL (ref 80.0–100.0)
Platelets: 375 10*3/uL (ref 150–400)
RBC: 2.3 MIL/uL — ABNORMAL LOW (ref 4.22–5.81)
RDW: 14 % (ref 11.5–15.5)
WBC: 14.8 10*3/uL — ABNORMAL HIGH (ref 4.0–10.5)
nRBC: 0 % (ref 0.0–0.2)

## 2021-05-31 LAB — GLUCOSE, CAPILLARY
Glucose-Capillary: 156 mg/dL — ABNORMAL HIGH (ref 70–99)
Glucose-Capillary: 156 mg/dL — ABNORMAL HIGH (ref 70–99)
Glucose-Capillary: 208 mg/dL — ABNORMAL HIGH (ref 70–99)
Glucose-Capillary: 163 mg/dL — ABNORMAL HIGH (ref 70–99)
Glucose-Capillary: 181 mg/dL — ABNORMAL HIGH (ref 70–99)

## 2021-05-31 LAB — PREPARE RBC (CROSSMATCH)

## 2021-05-31 LAB — CREATININE, SERUM
Creatinine, Ser: 0.84 mg/dL (ref 0.61–1.24)
GFR, Estimated: >60 mL/min (ref >60)

## 2021-05-31 MED ORDER — SODIUM CHLORIDE 0.9% IV SOLUTION: Freq: Once | INTRAVENOUS | Status: AC

## 2021-05-31 MED ORDER — ACETAMINOPHEN 325 MG PO TABS
650.0000 mg | ORAL_TABLET | Freq: Once | ORAL | Status: AC
  Administered 2021-05-31: 650 mg via ORAL
  Filled 2021-05-31: qty 2

## 2021-05-31 MED ORDER — AMLODIPINE BESYLATE 5 MG PO TABS
5.0000 mg | ORAL_TABLET | Freq: Every day | ORAL | Status: DC
  Administered 2021-05-31 – 2021-06-07 (×7): 5 mg via ORAL
  Filled 2021-05-31 (×7): qty 1

## 2021-05-31 MED ORDER — SODIUM CHLORIDE 0.9 % IV SOLN
3.0000 g | Freq: Four times a day (QID) | INTRAVENOUS | Status: DC
  Administered 2021-05-31 – 2021-06-01 (×4): 3 g via INTRAVENOUS
  Filled 2021-05-31 (×2): qty 3
  Filled 2021-05-31 (×4): qty 8
  Filled 2021-05-31: qty 3
  Filled 2021-05-31: qty 8

## 2021-05-31 MED ORDER — FUROSEMIDE 10 MG/ML IJ SOLN
20.0000 mg | Freq: Once | INTRAMUSCULAR | Status: AC
  Administered 2021-05-31: 20 mg via INTRAVENOUS
  Filled 2021-05-31: qty 4

## 2021-05-31 NOTE — Progress Notes (Signed)
PT Cancellation Note  Patient Details Name: Jose Schroeder MRN: [id number] DOB: 05-05-1932   Cancelled Treatment:    Reason Eval/Treat Not Completed: Patient declined, no reason specified Patient declined PT despite max cueing. Patient educated on the importance of physical activity and mobility, patient still declined. Will re-attempt at a later time/date as available and patient medically appropriate for PT. Thank you!   Iva Boop, PT  05/31/21. 11:02 AM

## 2021-05-31 NOTE — Progress Notes (Signed)
PT Cancellation Note  Patient Details Name: Jose Schroeder MRN: [id number] DOB: 02/27/1933   Cancelled Treatment:    Reason Eval/Treat Not Completed: Medical issues which prohibited therapy  HgB 6.6.  will hold therapy per protocol.   Chesley Noon 05/31/2021, 2:51 PM

## 2021-05-31 NOTE — Progress Notes (Signed)
°  Progress Note   Patient: Jose Schroeder DOB: Oct 22, 1932 DOA: 05/24/2021     7 DOS: the patient was seen and examined on 05/31/2021    Assessment and Plan: * Sepsis (HCC)- (present on admission) Clinical sepsis, present on admission with left second toe gangrene with progression on to the bottom of the foot and top of the foot.  Area of demarcation is larger than a few days ago..  Patient spiked a fever of 101 2 nights ago.  Blood cultures negative so far.  Change antibiotics to Unasyn.  Case discussed with Dr. Gilda Crease vascular surgery and he will set up for a left lower extremity BKA tomorrow or Wednesday depending on or availability.  Anemia of chronic disease Patient initially had a blood transfusion on a hemoglobin of 6.5.  Today's hemoglobin is 6.6.  We will transfuse another unit of packed red blood today because the patient will likely need the operating room tomorrow or Wednesday  Dementia without behavioral disturbance (HCC) Mental status still slightly impaired from few days ago.  Continue to monitor closely.  Peripheral vascular disease [facility]) Patient had an angiogram by Dr. Gilda Crease on 05/25/2021 with angioplasties and 2 stents placed.  Continue Plavix and Lipitor.  Diabetes mellitus (HCC) Type 2 diabetes mellitus.  Most recent hemoglobin A1c elevated 8.7 on 01/12/2021.  Patient on low-dose Lantus insulin.  Continue sliding scale.   Essential hypertension- (present on admission) Continue metoprolol        Subjective: Patient feels okay.  Offers no complaints.  No pain in his foot.  Negative for clinical sepsis.  Physical Exam: Vitals:   05/31/21 0437 05/31/21 0715 05/31/21 1044 05/31/21 1120  BP: (!) 148/77 (!) 150/80 (!) 124/54 (!) 152/59  Pulse: (!) 103 (!) 104 82 83  Resp: 18 18 17 16   Temp: 98.8 F (37.1 C) 98.5 F (36.9 C) 98.4 F (36.9 C) 98 F (36.7 C)  TempSrc: Oral Axillary Oral Oral  SpO2: 100% 100%    Weight:      Height:        Physical Exam HENT:     Head: Normocephalic.     Mouth/Throat:     Pharynx: No oropharyngeal exudate.  Eyes:     General: Lids are normal.     Conjunctiva/sclera: Conjunctivae normal.  Cardiovascular:     Rate and Rhythm: Normal rate and regular rhythm.     Heart sounds: Normal heart sounds, S1 normal and S2 normal.  Pulmonary:     Breath sounds: Normal breath sounds. No decreased breath sounds, wheezing, rhonchi or rales.  Abdominal:     Palpations: Abdomen is soft.     Tenderness: There is no abdominal tenderness.  Musculoskeletal:     Left lower leg: No swelling.  Skin:    General: Skin is warm.     Comments: See pictures below  Neurological:     Mental Status: He is alert.     Comments: Answer all questions appropriately.      Data Reviewed: Hemoglobin 6.6 today, white count 14.8  Family Communication: Updated patient's daughter on the phone at [phone]  Disposition: Status is: Inpatient Remains inpatient appropriate because: He will need left BKA during this hospital stay.  Vascular surgery to either schedule for tomorrow or Wednesday depending on or availability.  Planned Discharge Destination: Rehab  Author: Wednesday, MD 05/31/2021 2:29 PM  For on call review [email or web address].

## 2021-05-31 NOTE — Hospital Course (Addendum)
88 year old man with history of type 2 diabetes mellitus, hypertension, peripheral vascular disease, previous right BKA, dementia presents with altered mental status.  Patient was found to have clinical sepsis secondary to second toe gangrene.  Patient was empirically started on antibiotics.  Patient was seen in consultation by podiatry and vascular surgery.  The patient had an angiogram on 05/25/2021 with angioplasties and stents placed.  The patient has had worsening progression of his gangrene extending down into the left foot.  The patient was transfused 2 units of packed red blood cells during the hospital course.  Hemoglobin on 06/01/2021 is 8.0.  Dr. Delana Meyer has him on the schedule for 06/01/2021 for left BKA

## 2021-06-01 ENCOUNTER — Inpatient Hospital Stay: Payer: Medicare Other | Admitting: Anesthesiology

## 2021-06-01 ENCOUNTER — Other Ambulatory Visit: Payer: Self-pay

## 2021-06-01 ENCOUNTER — Encounter: Payer: Self-pay | Admitting: Obstetrics and Gynecology

## 2021-06-01 ENCOUNTER — Ambulatory Visit: Admission: RE | Admit: 2021-06-01 | Payer: Medicare Other | Source: Home / Self Care | Admitting: Vascular Surgery

## 2021-06-01 ENCOUNTER — Encounter: Admission: RE | Payer: Self-pay | Source: Home / Self Care

## 2021-06-01 ENCOUNTER — Encounter
Admission: EM | Disposition: A | Discharge status: Skilled Nursing Facility | Payer: Self-pay | Source: Home / Self Care | Attending: Internal Medicine

## 2021-06-01 DIAGNOSIS — I96 Gangrene, not elsewhere classified: Secondary | ICD-10-CM

## 2021-06-01 HISTORY — PX: AMPUTATION: SHX166

## 2021-06-01 LAB — GLUCOSE, CAPILLARY
Glucose-Capillary: 153 mg/dL — ABNORMAL HIGH (ref 70–99)
Glucose-Capillary: 139 mg/dL — ABNORMAL HIGH (ref 70–99)
Glucose-Capillary: 106 mg/dL — ABNORMAL HIGH (ref 70–99)
Glucose-Capillary: 107 mg/dL — ABNORMAL HIGH (ref 70–99)
Glucose-Capillary: 120 mg/dL — ABNORMAL HIGH (ref 70–99)

## 2021-06-01 LAB — CBC
HCT: 24.8 % — ABNORMAL LOW (ref 39.0–52.0)
Hemoglobin: 8 g/dL — ABNORMAL LOW (ref 13.0–17.0)
MCH: 29 pg (ref 26.0–34.0)
MCHC: 32.3 g/dL (ref 30.0–36.0)
MCV: 89.9 fL (ref 80.0–100.0)
Platelets: 345 10*3/uL (ref 150–400)
RBC: 2.76 MIL/uL — ABNORMAL LOW (ref 4.22–5.81)
RDW: 14.4 % (ref 11.5–15.5)
WBC: 11.8 10*3/uL — ABNORMAL HIGH (ref 4.0–10.5)
nRBC: 0 % (ref 0.0–0.2)

## 2021-06-01 LAB — BASIC METABOLIC PANEL
Anion gap: 6 (ref 5–15)
BUN: 11 mg/dL (ref 8–23)
CO2: 28 mmol/L (ref 22–32)
Calcium: 8.4 mg/dL — ABNORMAL LOW (ref 8.9–10.3)
Chloride: 103 mmol/L (ref 98–111)
Creatinine, Ser: 0.74 mg/dL (ref 0.61–1.24)
GFR, Estimated: >60 mL/min (ref >60)
Glucose, Bld: 178 mg/dL — ABNORMAL HIGH (ref 70–99)
Potassium: 3.8 mmol/L (ref 3.5–5.1)
Sodium: 137 mmol/L (ref 135–145)

## 2021-06-01 SURGERY — AMPUTATION BELOW KNEE
Anesthesia: General | Site: Knee | Laterality: Left

## 2021-06-01 SURGERY — LOWER EXTREMITY ANGIOGRAPHY
Anesthesia: Moderate Sedation | Site: Leg Lower | Laterality: Left

## 2021-06-01 MED ORDER — EPHEDRINE SULFATE (PRESSORS) 50 MG/ML IJ SOLN
INTRAMUSCULAR | Status: DC | PRN
Start: 2021-06-01 — End: 2021-06-01
  Administered 2021-06-01: 5 mg via INTRAVENOUS

## 2021-06-01 MED ORDER — CEFAZOLIN SODIUM-DEXTROSE 2-4 GM/100ML-% IV SOLN
2.0000 g | INTRAVENOUS | Status: AC
  Administered 2021-06-01: 2 g via INTRAVENOUS

## 2021-06-01 MED ORDER — BUPIVACAINE LIPOSOME 1.3 % IJ SUSP
INTRAMUSCULAR | Status: DC | PRN
  Administered 2021-06-01: 50 mL

## 2021-06-01 MED ORDER — FENTANYL CITRATE (PF) 100 MCG/2ML IJ SOLN
INTRAMUSCULAR | Status: DC | PRN
  Administered 2021-06-01 (×2): 50 ug via INTRAVENOUS

## 2021-06-01 MED ORDER — LIDOCAINE HCL (CARDIAC) PF 100 MG/5ML IV SOSY
PREFILLED_SYRINGE | INTRAVENOUS | Status: DC | PRN
  Administered 2021-06-01: 60 mg via INTRAVENOUS

## 2021-06-01 MED ORDER — PROPOFOL 10 MG/ML IV BOLUS
INTRAVENOUS | Status: DC | PRN
  Administered 2021-06-01: 90 mg via INTRAVENOUS

## 2021-06-01 MED ORDER — FENTANYL CITRATE (PF) 100 MCG/2ML IJ SOLN: 25.0000 ug | INTRAMUSCULAR | Status: DC | PRN

## 2021-06-01 MED ORDER — FENTANYL CITRATE (PF) 100 MCG/2ML IJ SOLN
INTRAMUSCULAR | Status: AC
  Filled 2021-06-01: qty 2

## 2021-06-01 MED ORDER — ONDANSETRON HCL 4 MG/2ML IJ SOLN: 4.0000 mg | Freq: Once | INTRAMUSCULAR | Status: DC | PRN

## 2021-06-01 MED ORDER — PHENYLEPHRINE HCL-NACL 20-0.9 MG/250ML-% IV SOLN
INTRAVENOUS | Status: DC | PRN
  Administered 2021-06-01: 30 ug/min via INTRAVENOUS

## 2021-06-01 MED ORDER — ONDANSETRON HCL 4 MG/2ML IJ SOLN
INTRAMUSCULAR | Status: AC
  Filled 2021-06-01: qty 2

## 2021-06-01 MED ORDER — 0.9 % SODIUM CHLORIDE (POUR BTL) OPTIME
TOPICAL | Status: DC | PRN
  Administered 2021-06-01: 800 mL

## 2021-06-01 MED ORDER — PROPOFOL 1000 MG/100ML IV EMUL
INTRAVENOUS | Status: AC
  Filled 2021-06-01: qty 100

## 2021-06-01 MED ORDER — SODIUM CHLORIDE 0.9 % IV SOLN: INTRAVENOUS | Status: DC | PRN

## 2021-06-01 MED ORDER — CEFAZOLIN SODIUM-DEXTROSE 2-4 GM/100ML-% IV SOLN
INTRAVENOUS | Status: AC
  Filled 2021-06-01: qty 100

## 2021-06-01 MED ORDER — BUPIVACAINE LIPOSOME 1.3 % IJ SUSP
INTRAMUSCULAR | Status: AC
  Filled 2021-06-01: qty 20

## 2021-06-01 MED ORDER — BUPIVACAINE HCL (PF) 0.5 % IJ SOLN
INTRAMUSCULAR | Status: AC
  Filled 2021-06-01: qty 30

## 2021-06-01 MED ORDER — PHENYLEPHRINE HCL (PRESSORS) 10 MG/ML IV SOLN
INTRAVENOUS | Status: DC | PRN
  Administered 2021-06-01 (×4): 100 ug via INTRAVENOUS

## 2021-06-01 MED ORDER — LIDOCAINE HCL (PF) 2 % IJ SOLN
INTRAMUSCULAR | Status: AC
  Filled 2021-06-01: qty 5

## 2021-06-01 MED ORDER — SEVOFLURANE IN SOLN
RESPIRATORY_TRACT | Status: AC
  Filled 2021-06-01: qty 250

## 2021-06-01 SURGICAL SUPPLY — 58 items
ADH SKN CLS APL DERMABOND .7 (GAUZE/BANDAGES/DRESSINGS)
APL PRP STRL LF DISP 70% ISPRP (MISCELLANEOUS) ×1
BAG COUNTER SPONGE SURGICOUNT (BAG) ×2 IMPLANT
BAG SPNG CNTER NS LX DISP (BAG) ×1
BLADE SAGITTAL WIDE XTHICK NO (BLADE) ×2 IMPLANT
BLADE SURG SZ10 CARB STEEL (BLADE) ×2 IMPLANT
BNDG CMPR STD VLCR NS LF 5.8X4 (GAUZE/BANDAGES/DRESSINGS)
BNDG COHESIVE 4X5 TAN ST LF (GAUZE/BANDAGES/DRESSINGS) ×2 IMPLANT
BNDG ELASTIC 4X5.8 VLCR NS LF (GAUZE/BANDAGES/DRESSINGS) ×1 IMPLANT
BNDG GAUZE ELAST 4 BULKY (GAUZE/BANDAGES/DRESSINGS) ×2 IMPLANT
BRUSH SCRUB EZ  4% CHG (MISCELLANEOUS) ×2
BRUSH SCRUB EZ 4% CHG (MISCELLANEOUS) ×1 IMPLANT
CANISTER WOUND CARE 500ML ATS (WOUND CARE) IMPLANT
CHLORAPREP W/TINT 26 (MISCELLANEOUS) ×2 IMPLANT
DERMABOND ADVANCED (GAUZE/BANDAGES/DRESSINGS)
DERMABOND ADVANCED .7 DNX12 (GAUZE/BANDAGES/DRESSINGS) ×2 IMPLANT
DRAPE INCISE IOBAN 66X45 STRL (DRAPES) ×2 IMPLANT
DRSG GAUZE FLUFF 36X18 (GAUZE/BANDAGES/DRESSINGS) ×2 IMPLANT
DRSG VAC ATS MED SENSATRAC (GAUZE/BANDAGES/DRESSINGS) IMPLANT
ELECT CAUTERY BLADE 6.4 (BLADE) ×2 IMPLANT
ELECT REM PT RETURN 9FT ADLT (ELECTROSURGICAL) ×2
ELECTRODE REM PT RTRN 9FT ADLT (ELECTROSURGICAL) ×1 IMPLANT
GLOVE SURG ENC MOIS LTX SZ7 (GLOVE) ×2 IMPLANT
GLOVE SURG SYN 6.5 ES PF (GLOVE) ×2 IMPLANT
GLOVE SURG SYN 6.5 PF PI (GLOVE) IMPLANT
GLOVE SURG SYN 8.0 (GLOVE) ×2 IMPLANT
GLOVE SURG SYN 8.0 PF PI (GLOVE) ×1 IMPLANT
GLOVE SURG UNDER LTX SZ7.5 (GLOVE) ×2 IMPLANT
GOWN STRL REUS W/ TWL LRG LVL3 (GOWN DISPOSABLE) ×1 IMPLANT
GOWN STRL REUS W/ TWL XL LVL3 (GOWN DISPOSABLE) ×2 IMPLANT
GOWN STRL REUS W/TWL LRG LVL3 (GOWN DISPOSABLE) ×2
GOWN STRL REUS W/TWL XL LVL3 (GOWN DISPOSABLE) ×4
HANDLE YANKAUER SUCT BULB TIP (MISCELLANEOUS) ×2 IMPLANT
KIT TURNOVER KIT A (KITS) ×2 IMPLANT
LABEL OR SOLS (LABEL) ×1 IMPLANT
MANIFOLD NEPTUNE II (INSTRUMENTS) ×2 IMPLANT
NS IRRIG 1000ML POUR BTL (IV SOLUTION) ×1 IMPLANT
NS IRRIG 500ML POUR BTL (IV SOLUTION) ×2 IMPLANT
PACK EXTREMITY ARMC (MISCELLANEOUS) ×2 IMPLANT
PAD ABD DERMACEA PRESS 5X9 (GAUZE/BANDAGES/DRESSINGS) ×2 IMPLANT
PAD PREP 24X41 OB/GYN DISP (PERSONAL CARE ITEMS) ×2 IMPLANT
SPONGE T-LAP 18X18 ~~LOC~~+RFID (SPONGE) ×4 IMPLANT
STAPLER SKIN PROX 35W (STAPLE) IMPLANT
STOCKINETTE M/LG 89821 (MISCELLANEOUS) ×2 IMPLANT
SUT MNCRL 4-0 (SUTURE) ×2
SUT MNCRL 4-0 27XMFL (SUTURE) ×1
SUT SILK 2 0 (SUTURE) ×2
SUT SILK 2-0 18XBRD TIE 12 (SUTURE) ×1 IMPLANT
SUT SILK 3 0 (SUTURE) ×2
SUT SILK 3-0 18XBRD TIE 12 (SUTURE) ×1 IMPLANT
SUT VIC AB 0 CT1 36 (SUTURE) ×11 IMPLANT
SUT VIC AB 3-0 SH 27 (SUTURE) ×4
SUT VIC AB 3-0 SH 27X BRD (SUTURE) ×2 IMPLANT
SUT VICRYL PLUS ABS 0 54 (SUTURE) ×2 IMPLANT
SUTURE MNCRL 4-0 27XMF (SUTURE) ×1 IMPLANT
SYR 20ML LL LF (SYRINGE) ×4 IMPLANT
TAPE UMBILICAL 1/8X18 (MISCELLANEOUS) ×1 IMPLANT
TOWEL OR 17X26 4PK STRL BLUE (TOWEL DISPOSABLE) ×2 IMPLANT

## 2021-06-01 NOTE — Progress Notes (Signed)
Progress Note   Patient: Jose Schroeder [id number] DOB: July 09, 1932 DOA: 05/24/2021     8 DOS: the patient was seen and examined on 06/01/2021   Brief hospital course: 88 year old man with history of type 2 diabetes mellitus, hypertension, peripheral vascular disease, previous right BKA, dementia presents with altered mental status.  Patient was found to have clinical sepsis secondary to second toe gangrene.  Patient was empirically started on antibiotics.  Patient was seen in consultation by podiatry and vascular surgery.  The patient had an angiogram on 05/25/2021 with angioplasties and stents placed.  The patient has had worsening progression of his gangrene extending down into the left foot.  The patient was transfused 2 units of packed red blood cells during the hospital course.  Hemoglobin on 06/01/2021 is 8.0.  Dr. Delana Meyer has him on the schedule for 06/01/2021 for left BKA  Assessment and Plan: * Sepsis ([place])- (present on admission) Clinical sepsis, present on admission with left second toe gangrene with progression on to the bottom of the foot and top of the foot.  Area of demarcation is larger than a few days ago.  Patient spiked a fever of 101, 3 nights ago.  Blood cultures negative so far.  Change antibiotics to Unasyn.  Will discontinue Unasyn since having a BKA  Anemia of chronic disease Today's hemoglobin is 8.0.  The patient received 2 units of packed red blood cells during the hospital course on 05/24/2021 and also on 05/31/2021.  Watch hemoglobin closely postoperatively  Dementia without behavioral disturbance ([place]) Mental status still slightly impaired from few days ago.  Continue to monitor closely.  Peripheral vascular disease [facility]) Patient had an angiogram by Dr. Delana Meyer on 05/25/2021 with angioplasties and 2 stents placed.  Continue Plavix and Lipitor.  Diabetes mellitus ([place]) Type 2 diabetes mellitus.  Most recent hemoglobin A1c elevated 8.7 on 01/12/2021.  Patient  on low-dose Lantus insulin.  Continue sliding scale.   Essential hypertension- (present on admission) Continue metoprolol        Subjective: Patient seen this morning and he felt well and offers no complaints.  Admitted with sepsis and gangrene of the second toe that has progressed to the foot.  For BKA today.  Physical Exam: Vitals:   06/01/21 0429 06/01/21 0729 06/01/21 1103 06/01/21 1522  BP: (!) 148/66 133/60 (!) 144/65 (!) 169/76  Pulse: 89 89 78 96  Resp: 17 16 15 15   Temp: 98.2 F (36.8 C) 97.8 F (36.6 C) 98.2 F (36.8 C) 99 F (37.2 C)  TempSrc:    Temporal  SpO2: 100% 99% 100% 99%  Weight:      Height:       Physical Exam HENT:     Head: Normocephalic.     Mouth/Throat:     Pharynx: No oropharyngeal exudate.  Eyes:     General: Lids are normal.     Conjunctiva/sclera: Conjunctivae normal.  Cardiovascular:     Rate and Rhythm: Normal rate and regular rhythm.     Heart sounds: Normal heart sounds, S1 normal and S2 normal.  Pulmonary:     Breath sounds: Normal breath sounds. No decreased breath sounds, wheezing, rhonchi or rales.  Abdominal:     Palpations: Abdomen is soft.     Tenderness: There is no abdominal tenderness.  Musculoskeletal:     Left lower leg: No swelling.  Skin:    General: Skin is warm.     Comments: See pictures below  Neurological:     Mental  Status: He is alert.     Comments: Answer all questions appropriately.      Data Reviewed: Hemoglobin up at 8.0 today, white blood cell count 11.8  Family Communication: Updated patient's daughter Basilia Jumbo on the phone at [phone]  Disposition: Status is: Inpatient Remains inpatient appropriate because: He will have a BKA today and will need postop recovery time here in the hospital  Planned Discharge Destination: Rehab  Author: Loletha Grayer, MD 06/01/2021 3:27 PM  For on call review [email or web address].

## 2021-06-01 NOTE — Anesthesia Postprocedure Evaluation (Signed)
Anesthesia Post Note  Patient: Jose Schroeder  Procedure(s) Performed: AMPUTATION BELOW KNEE (Left: Knee)  Patient location during evaluation: PACU Anesthesia Type: General Level of consciousness: awake and alert Pain management: pain level controlled Vital Signs Assessment: post-procedure vital signs reviewed and stable Respiratory status: spontaneous breathing, nonlabored ventilation, respiratory function stable and patient connected to nasal cannula oxygen Cardiovascular status: blood pressure returned to baseline and stable Postop Assessment: no apparent nausea or vomiting Anesthetic complications: no   No notable events documented.   Last Vitals:  Vitals:   06/01/21 1815 06/01/21 1828  BP: (!) 108/56 122/60  Pulse: 81 84  Resp: 14 18  Temp:  36.6 C  SpO2: 99% 98%    Last Pain:  Vitals:   06/01/21 1810  TempSrc:   PainSc: 0-No pain                 Lenard Simmer

## 2021-06-01 NOTE — Anesthesia Preprocedure Evaluation (Addendum)
Anesthesia Evaluation  Patient identified by MRN, date of birth, ID band Patient confused    Reviewed: Allergy & Precautions, NPO status , Patient's Chart, lab work & pertinent test results  Airway Mallampati: III  TM Distance: >3 FB Neck ROM: full    Dental  (+) Chipped, Dental Advidsory Given, Poor Dentition   Pulmonary neg pulmonary ROS,    Pulmonary exam normal        Cardiovascular hypertension, Pt. on medications + Peripheral Vascular Disease  Normal cardiovascular exam(-) dysrhythmias   EKG 1/23: Sinus rhythm Right bundle branch block Baseline wander in lead(s) II III aVF   Angiogram by Dr. Gilda Crease on 05/25/2021 with angioplasties and 2 stents placed   Neuro/Psych PSYCHIATRIC DISORDERS Dementia negative neurological ROS     GI/Hepatic negative GI ROS, Neg liver ROS,   Endo/Other  diabetesA1c elevated 8.7 on 01/12/2021  Renal/GU      Musculoskeletal   Abdominal   Peds  Hematology  (+) Blood dyscrasia (Anemia of chronic disease), anemia ,   Anesthesia Other Findings  Sepsis for Left toe gangrene  Past Medical History: No date: Diabetes mellitus without complication (HCC) No date: Hypertension No date: Peripheral vascular disease (HCC)  Past Surgical History: 01/15/2016: AMPUTATION TOE; Right     Comment:  Procedure: AMPUTATION TOE;  Surgeon: Gwyneth Revels, DPM;              Location: [facility] ORS;  Service: Podiatry;  Laterality:               Right; 05/25/2021: LOWER EXTREMITY ANGIOGRAPHY; Left     Comment:  Procedure: Lower Extremity Angiography;  Surgeon:               Renford Dills, MD;  Location: [facility] INVASIVE CV LAB;               Service: Cardiovascular;  Laterality: Left; 01/07/2016: PERIPHERAL VASCULAR CATHETERIZATION; Right     Comment:  Procedure: Abdominal Aortogram w/Lower Extremity;                Surgeon: Renford Dills, MD;  Location: [facility] INVASIVE               CV LAB;  Service:  Cardiovascular;  Laterality: Right; No date: TOE SURGERY     Comment:  amputated 2 toes on L) foot  BMI    Body Mass Index: 20.56 kg/m      Reproductive/Obstetrics negative OB ROS                          Anesthesia Physical Anesthesia Plan  ASA: 3  Anesthesia Plan: General   Post-op Pain Management:    Induction: Intravenous  PONV Risk Score and Plan: Ondansetron, Dexamethasone and Treatment may vary due to age or medical condition  Airway Management Planned: LMA and Oral ETT  Additional Equipment:   Intra-op Plan:   Post-operative Plan: Extubation in OR  Informed Consent:     Consent reviewed with POA  Plan Discussed with: Anesthesiologist, CRNA and Surgeon  Anesthesia Plan Comments:     Anesthesia Quick Evaluation

## 2021-06-01 NOTE — Interval H&P Note (Signed)
History and Physical Interval Note:  06/01/2021 4:04 PM  Jose Schroeder  has presented today for surgery, with the diagnosis of atherosclerosis with gangrene.  The various methods of treatment have been discussed with the patient and family. After consideration of risks, benefits and other options for treatment, the patient has consented to  Procedure(s): AMPUTATION BELOW KNEE (Left) as a surgical intervention.  The patient's history has been reviewed, patient examined, no change in status, stable for surgery.  I have reviewed the patient's chart and labs.  Questions were answered to the patient's satisfaction.     Hortencia Pilar

## 2021-06-01 NOTE — Progress Notes (Signed)
I spoke with Mrs. Jose Schroeder the patient's wife by phone.  I gave her an update of the condition of his left foot.  I informed her that his foot has significantly worsened and that yesterday I was contacted that he has developed a low-grade fever.  Based on these findings I believe that moving forward with left below-knee amputation is warranted and the most appropriate course of action.  I discussed this with her and she agreed.  I informed her that there is time in the OR at 345 today and she was also in agreement with moving forward later this afternoon.  I will proceed with left below-knee amputation later today

## 2021-06-01 NOTE — Op Note (Signed)
OPERATIVE NOTE   PROCEDURE: Left below-the-knee amputation  PRE-OPERATIVE DIAGNOSIS: Left foot gangrene  POST-OPERATIVE DIAGNOSIS: same as above  SURGEON: Renford Dills, MD  ASSISTANT(S): None  ANESTHESIA: general  ESTIMATED BLOOD LOSS: 50 cc  FINDING(S): Adequate bleeding at the level of the amputation for wound healing skin appears healthy and viable  SPECIMEN(S):  Left below-the-knee amputation  INDICATIONS:   Jose Schroeder is a 88 y.o. male who presents with left leg gangrene.  The patient is scheduled for a left below-the-knee amputation.  I discussed in depth with the patient the risks, benefits, and alternatives to this procedure.  The patient is aware that the risk of this operation included but are not limited to:  bleeding, infection, myocardial infarction, stroke, death, failure to heal amputation wound, and possible need for more proximal amputation.  The patient is aware of the risks and agrees proceed forward with the procedure.  DESCRIPTION:  After full informed written consent was obtained from the patient, the patient was brought back to the operating room, and placed supine upon the operating table.  Prior to induction, the patient received IV antibiotics.  The patient was then prepped and draped in the standard fashion for a below-the-knee amputation.  After obtaining adequate anesthesia, the patient was prepped and draped in the standard fashion for a left below-the-knee amputation.  I marked out the anterior incision two finger breadths below the tibial tuberosity and then the marked out a posterior flap that was one third of the circumference of the calf in length.   I made the incisions for these flaps, and then dissected through the subcutaneous tissue, fascia, and muscle anteriorly.  I elevated  the periosteal tissue superiorly so that the tibia was about 3-4 cm shorter than the anterior skin flap.  I then transected the tibia with a power saw and  then took a wedge off the tibia anteriorly with the power saw.  Then I smoothed out the rough edges.  In a similar fashion, I cut back the fibula about two centimeters higher than the level of the tibia with a bone cutter.  I put a bone hook into the distal tibia and then used a large amputation knife to sharply develop a tissue plane through the muscle along the fibula.  In such fashion, the posterior flap was developed.  At this point, the specimen was passed off the field as the below-the-knee amputation.  At this point, I clamped all visibly bleeding arteries and veins using a combination of suture ligation with Silk suture and electrocautery.  Bleeding continued to be controlled with electrocautery and suture ligature.  The stump was washed off with sterile normal saline and no further active bleeding was noted.  I reapproximated the anterior and posterior fascia  with interrupted stitches of 0 Vicryl.  This was completed along the entire length of anterior and posterior fascia until there were no more loose space in the fascial line. I then placed a layer of 2-0 Vicryl sutures in the subcutaneous tissue. The skin was then  reapproximated with staples.  The stump was washed off and dried.  The incision was dressed with Xeroform and  then fluffs were applied.  Kerlix was wrapped around the leg and then gently an ACE wrap was applied.    COMPLICATIONS: none  CONDITION: stable   Jose Schroeder  06/01/2021, 5:30 PM    This note was created with Dragon Medical transcription system. Any errors in dictation are purely unintentional.

## 2021-06-01 NOTE — Transfer of Care (Addendum)
Immediate Anesthesia Transfer of Care Note  Patient: Jose Schroeder  Procedure(s) Performed: AMPUTATION BELOW KNEE (Left: Knee)  Patient Location: PACU  Anesthesia Type:General  Level of Consciousness: drowsy  Airway & Oxygen Therapy: Patient Spontanous Breathing  Post-op Assessment: Report given to RN  Post vital signs: Reviewed- stable  Last Vitals:  Vitals Value Taken Time  BP    Temp    Pulse    Resp    SpO2      Last Pain:  Vitals:   06/01/21 1522  TempSrc: Temporal  PainSc: 0-No pain         Complications: No notable events documented.

## 2021-06-01 NOTE — Progress Notes (Signed)
PT Cancellation Note  Patient Details Name: Jose Schroeder MRN: [id number] DOB: May 29, 1932   Cancelled Treatment:    Reason Eval/Treat Not Completed: Medical issues which prohibited therapy;Patient at procedure or test/unavailable. Pt planned for left BKA this afternoon. Will hold this date and wait for new orders to be placed post-amputation.    Patrina Levering PT, DPT 06/01/21 1:14 PM [id number]

## 2021-06-02 ENCOUNTER — Encounter: Payer: Self-pay | Admitting: Vascular Surgery

## 2021-06-02 LAB — GLUCOSE, CAPILLARY
Glucose-Capillary: 72 mg/dL (ref 70–99)
Glucose-Capillary: 57 mg/dL — ABNORMAL LOW (ref 70–99)
Glucose-Capillary: 79 mg/dL (ref 70–99)
Glucose-Capillary: 256 mg/dL — ABNORMAL HIGH (ref 70–99)
Glucose-Capillary: 190 mg/dL — ABNORMAL HIGH (ref 70–99)

## 2021-06-02 LAB — BASIC METABOLIC PANEL
Anion gap: 6 (ref 5–15)
BUN: 9 mg/dL (ref 8–23)
CO2: 28 mmol/L (ref 22–32)
Calcium: 8.4 mg/dL — ABNORMAL LOW (ref 8.9–10.3)
Chloride: 104 mmol/L (ref 98–111)
Creatinine, Ser: 0.64 mg/dL (ref 0.61–1.24)
GFR, Estimated: >60 mL/min (ref >60)
Glucose, Bld: 84 mg/dL (ref 70–99)
Potassium: 3.9 mmol/L (ref 3.5–5.1)
Sodium: 138 mmol/L (ref 135–145)

## 2021-06-02 LAB — CBC
HCT: 26.5 % — ABNORMAL LOW (ref 39.0–52.0)
Hemoglobin: 8.4 g/dL — ABNORMAL LOW (ref 13.0–17.0)
MCH: 29 pg (ref 26.0–34.0)
MCHC: 31.7 g/dL (ref 30.0–36.0)
MCV: 91.4 fL (ref 80.0–100.0)
Platelets: 359 10*3/uL (ref 150–400)
RBC: 2.9 MIL/uL — ABNORMAL LOW (ref 4.22–5.81)
RDW: 14.6 % (ref 11.5–15.5)
WBC: 12.9 10*3/uL — ABNORMAL HIGH (ref 4.0–10.5)
nRBC: 0 % (ref 0.0–0.2)

## 2021-06-02 NOTE — TOC Progression Note (Signed)
Transition of Care [facility]) - Progression Note    Patient Details  Name: CATCHER DEHOYOS MRN: [id number] Date of Birth: 02-02-1933  Transition of Care [facility]) CM/SW Contact  Marlowe Sax, RN Phone Number: 06/02/2021, 10:14 AM  Clinical Narrative:   Patient has a BKA yesterday, PT to eval, will determine the dispostion, Wife would like to have him go to [facility] SNF FL2 completed, and PASSr obtained, will do Bedsearch    Expected Discharge Plan: Skilled Nursing Facility Barriers to Discharge: Continued Medical Work up  Expected Discharge Plan and Services Expected Discharge Plan: Skilled Nursing Facility       Living arrangements for the past 2 months: Single Family Home                                       Social Determinants of Health (SDOH) Interventions    Readmission Risk Interventions No flowsheet data found.

## 2021-06-02 NOTE — Plan of Care (Signed)
°  Problem: Education: Goal: Knowledge of General Education information will improve Description: Including pain rating scale, medication(s)/side effects and non-pharmacologic comfort measures Outcome: Progressing   Problem: Clinical Measurements: Goal: Ability to maintain clinical measurements within normal limits will improve Outcome: Progressing   Problem: Clinical Measurements: Goal: Respiratory complications will improve Outcome: Progressing   Problem: Activity: Goal: Risk for activity intolerance will decrease Outcome: Progressing   Problem: Coping: Goal: Level of anxiety will decrease Outcome: Progressing   Problem: Pain Managment: Goal: General experience of comfort will improve Outcome: Progressing

## 2021-06-02 NOTE — Progress Notes (Signed)
PROGRESS NOTE    Jose Schroeder  DQQ:[id number] DOB: 12-Aug-1932 DOA: 05/24/2021 PCP: Juline Patch, MD    Assessment & Plan:   Principal Problem:   Sepsis [facility]) Active Problems:   Diabetes mellitus ([place])   Essential hypertension   Peripheral vascular disease ([place])   Anemia of chronic disease   Gangrene of left foot ([place])   Dementia without behavioral disturbance ([place])   Atherosclerosis of native artery of left lower extremity with gangrene ([place])   Sepsis: POA. See Dr. Marshia Ly note on how pt met sepsis criteria.  W/ left second toe gangrene. Blood cxs NGTD. Abxs were d/c on 06/01/21  Left second toe gangrene: w/ progression to bottom & top of foot. 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 H&H are labile    Dementia: re-orient prn. Continue w/ supportive care  PVD: s/p angiogram w/ 2 stents placed & angioplasties as per vascular surg. Continue on plavix, statin    DM2: poorly controlled, HbA1c 8.7. Continue on glargine, SSI w/ accuchecks    HTN: continue on metoprolol    DVT prophylaxis: will start when ok w/ vascular surg  Code Status: full  Family Communication: discussed pt's care w/ pt's wife, [name] and and answered her questions  Disposition Plan: will likely d/c to SNF   Level of care: Med-Surg  Status is: Inpatient Remains inpatient appropriate because: s/p L BKA yesterday     Consultants:  Vascular surg   Procedures: L BKA  Antimicrobials:    Subjective: Pt denies any pain. Pt has dementia   Objective: Vitals:   06/01/21 1828 06/01/21 2040 06/02/21 0423 06/02/21 0736  BP: 122/60 (!) 152/68 130/61 (!) 152/62  Pulse: 84 93 78 81  Resp: _0 Temp: 97.9 F (36.6 C) 98.7 F (37.1 C) 98.5 F (36.9 C) 98.5 F (36.9 C)  TempSrc:      SpO2: 98% 100%  100%  Weight:      Height:        Intake/Output Summary (Last 24 hours) at 06/02/2021 0837 Last data filed at 06/01/2021  1805 Gross per 24 hour  Intake 950 ml  Output 50 ml  Net 900 ml   Filed Weights   05/24/21 1249  Weight: 65 kg    Examination:  General exam: Appears calm and comfortable  Respiratory system: Clear to auscultation. Respiratory effort normal. Cardiovascular system: S1 & S2 +. No rubs, gallops or clicks.  Gastrointestinal system: Abdomen is nondistended, soft and nontender. Normal bowel sounds heard. Central nervous system: Alert and awake. Moves all extremities & stumps Psychiatry: Judgement and insight appear poor. Flat mood and affect    Data Reviewed: I have personally reviewed following labs and imaging studies  CBC: Recent Labs  Lab 05/30/21 0919 05/31/21 0636 06/01/21 0355 06/02/21 0352  WBC 17.2* 14.8* 11.8* 12.9*  HGB 7.4* 6.6* 8.0* 8.4*  HCT 23.7* 21.1* 24.8* 26.5*  MCV 91.5 91.7 89.9 91.4  PLT 469* 375 345 194   Basic Metabolic Panel: Recent Labs  Lab 05/28/21 0900 05/30/21 0426 05/31/21 0636 06/01/21 0355 06/02/21 0352  NA  --   --   --  137 138  K  --   --   --  3.8 3.9  CL  --   --   --  103 104  CO2  --   --   --  28 28  GLUCOSE  --   --   --  178* 84  BUN  --   --   --  11 9  CREATININE 0.79 0.64 0.84 0.74 0.64  CALCIUM  --   --   --  8.4* 8.4*   GFR: Estimated Creatinine Clearance: 60.9 mL/min (by C-G formula based on SCr of 0.64 mg/dL). Liver Function Tests: No results for input(s): AST, ALT, ALKPHOS, BILITOT, PROT, ALBUMIN in the last 168 hours. No results for input(s): LIPASE, AMYLASE in the last 168 hours. No results for input(s): AMMONIA in the last 168 hours. Coagulation Profile: No results for input(s): INR, PROTIME in the last 168 hours. Cardiac Enzymes: No results for input(s): CKTOTAL, CKMB, CKMBINDEX, TROPONINI in the last 168 hours. BNP (last 3 results) No results for input(s): PROBNP in the last 8760 hours. HbA1C: No results for input(s): HGBA1C in the last 72 hours. CBG: Recent Labs  Lab 06/01/21 1105 06/01/21 1532  06/01/21 1734 06/01/21 2041 06/02/21 0732  GLUCAP 139* 106* 107* 120* 72   Lipid Profile: No results for input(s): CHOL, HDL, LDLCALC, TRIG, CHOLHDL, LDLDIRECT in the last 72 hours. Thyroid Function Tests: No results for input(s): TSH, T4TOTAL, FREET4, T3FREE, THYROIDAB in the last 72 hours. Anemia Panel: No results for input(s): VITAMINB12, FOLATE, FERRITIN, TIBC, IRON, RETICCTPCT in the last 72 hours. Sepsis Labs: No results for input(s): PROCALCITON, LATICACIDVEN in the last 168 hours.  Recent Results (from the past 240 hour(s))  Resp Panel by RT-PCR (Flu A&B, Covid) Nasopharyngeal Swab     Status: None   Collection Time: 05/24/21 12:52 PM   Specimen: Nasopharyngeal Swab; Nasopharyngeal(NP) swabs in vial transport medium  Result Value Ref Range Status   SARS Coronavirus 2 by RT PCR NEGATIVE NEGATIVE Final    Comment: (NOTE) SARS-CoV-2 target nucleic acids are NOT DETECTED.  The SARS-CoV-2 RNA is generally detectable in upper respiratory specimens during the acute phase of infection. The lowest concentration of SARS-CoV-2 viral copies this assay can detect is 138 copies/mL. A negative result does not preclude SARS-Cov-2 infection and should not be used as the sole basis for treatment or other patient management decisions. A negative result may occur with  improper specimen collection/handling, submission of specimen other than nasopharyngeal swab, presence of viral mutation(s) within the areas targeted by this assay, and inadequate number of viral copies(<138 copies/mL). A negative result must be combined with clinical observations, patient history, and epidemiological information. The expected result is Negative.  Fact Sheet for Patients:  EntrepreneurPulse.com.au  Fact Sheet for Healthcare Providers:  IncredibleEmployment.be  This test is no t yet approved or cleared by the [place] FDA and  has been authorized for detection  and/or diagnosis of SARS-CoV-2 by FDA under an Emergency Use Authorization (EUA). This EUA will remain  in effect (meaning this test can be used) for the duration of the COVID-19 declaration under Section 564(b)(1) of the Act, 21 U.S.C.section 360bbb-3(b)(1), unless the authorization is terminated  or revoked sooner.       Influenza A by PCR NEGATIVE NEGATIVE Final   Influenza B by PCR NEGATIVE NEGATIVE Final    Comment: (NOTE) The Xpert Xpress SARS-CoV-2/FLU/RSV plus assay is intended as an aid in the diagnosis of influenza from Nasopharyngeal swab specimens and should not be used as a sole basis for treatment. Nasal washings and aspirates are unacceptable for Xpert Xpress SARS-CoV-2/FLU/RSV testing.  Fact Sheet for Patients: EntrepreneurPulse.com.au  Fact Sheet for Healthcare Providers: IncredibleEmployment.be  This test is not yet approved or cleared by the [place] FDA and has been  authorized for detection and/or diagnosis of SARS-CoV-2 by FDA under an Emergency Use Authorization (EUA). This EUA will remain in effect (meaning this test can be used) for the duration of the COVID-19 declaration under Section 564(b)(1) of the Act, 21 U.S.C. section 360bbb-3(b)(1), unless the authorization is terminated or revoked.  Performed at [facility], [address] [place], [place] [address]   Blood Culture (routine x 2)     Status: None   Collection Time: 05/24/21 12:52 PM   Specimen: BLOOD  Result Value Ref Range Status   Specimen Description BLOOD RIGHT ANTECUBITAL  Final   Special Requests   Final    BOTTLES DRAWN AEROBIC AND ANAEROBIC Blood Culture results may not be optimal due to an excessive volume of blood received in culture bottles   Culture   Final    NO GROWTH 5 DAYS Performed at [facility], [address], [place], [place] [address]    Report Status 05/29/2021 FINAL  Final  Blood Culture (routine x 2)      Status: None   Collection Time: 05/24/21 12:52 PM   Specimen: BLOOD  Result Value Ref Range Status   Specimen Description BLOOD BLOOD RIGHT ARM  Final   Special Requests   Final    BOTTLES DRAWN AEROBIC AND ANAEROBIC Blood Culture adequate volume   Culture   Final    NO GROWTH 5 DAYS Performed at [facility], [address] [place], [place] [address]    Report Status 05/29/2021 FINAL  Final  Aerobic/Anaerobic Culture w Gram Stain (surgical/deep wound)     Status: None   Collection Time: 05/24/21  5:34 PM   Specimen: Wound  Result Value Ref Range Status   Specimen Description   Final    WOUND Performed at [facility], [address] [place], [place] [address]    Special Requests   Final    LEFT FOOT Performed at [facility], [address] [place], [place] [address]    Gram Stain   Final    NO SQUAMOUS EPITHELIAL CELLS SEEN FEW WBC SEEN FEW GRAM NEGATIVE RODS ABUNDANT GRAM POSITIVE COCCI    Culture   Final    ABUNDANT MULTIPLE ORGANISMS PRESENT, NONE PREDOMINANT FEW PREVOTELLA MELANINOGENICA BETA LACTAMASE POSITIVE Performed at [facility], [place] [address] [place], [place] [address]    Report Status 05/28/2021 FINAL  Final  Urine Culture     Status: None   Collection Time: 05/24/21 10:00 PM   Specimen: Urine, Random  Result Value Ref Range Status   Specimen Description   Final    URINE, RANDOM Performed at [facility], [address] [place], [place] [address]    Special Requests   Final    NONE Performed at [facility], [address], [place], [place] [address]    Culture   Final    NO GROWTH Performed at [facility], [place] [address] [place], [place] [address]    Report Status 05/26/2021 FINAL  Final  CULTURE, BLOOD (ROUTINE X 2) w Reflex to ID Panel     Status: None (Preliminary result)   Collection Time: 05/30/21  9:19 AM   Specimen: BLOOD  Result Value Ref Range Status   Specimen  Description BLOOD RH  Final   Special Requests   Final    BOTTLES DRAWN AEROBIC AND ANAEROBIC Blood Culture adequate volume   Culture   Final    NO GROWTH 3  DAYS Performed at [facility], [address] [place], [place] [address]    Report Status PENDING  Incomplete  CULTURE, BLOOD (ROUTINE X 2) w Reflex to ID Panel     Status: None (Preliminary result)   Collection Time: 05/30/21  9:19 AM   Specimen: BLOOD  Result Value Ref Range Status   Specimen Description BLOOD LW  Final   Special Requests   Final    BOTTLES DRAWN AEROBIC AND ANAEROBIC Blood Culture adequate volume   Culture   Final    NO GROWTH 3 DAYS Performed at [facility], [address], [place],  [address]    Report Status PENDING  Incomplete         Radiology Studies: No results found.      Scheduled Meds:  amLODipine  5 mg Oral Daily   atorvastatin  20 mg Oral QPM   clopidogrel  75 mg Oral Q breakfast   insulin aspart  0-15 Units Subcutaneous TID WC   insulin aspart  0-5 Units Subcutaneous QHS   insulin glargine-yfgn  6 Units Subcutaneous QHS   iron polysaccharides  150 mg Oral Daily   metoprolol tartrate  50 mg Oral BID   sodium chloride flush  3 mL Intravenous Q12H   Continuous Infusions:  sodium chloride       LOS: 9 days    Time spent: 15 mins    Wyvonnia Dusky, MD [facility] Pager [phone]  If 7PM-7AM, please contact night-coverage 06/02/2021, 8:37 AM

## 2021-06-02 NOTE — Progress Notes (Signed)
Inpatient Diabetes Program Recommendations  AACE/ADA: New Consensus Statement on Inpatient Glycemic Control   Target Ranges:  Prepandial:   less than 140 mg/dL      Peak postprandial:   less than 180 mg/dL (1-2 hours)      Critically ill patients:  140 - 180 mg/dL    Latest Reference Range & Units 06/02/21 07:32 06/02/21 11:28 06/02/21 11:50  Glucose-Capillary 70 - 99 mg/dL 72 57 (L) 79    Latest Reference Range & Units 06/01/21 07:37 06/01/21 11:05 06/01/21 15:32 06/01/21 17:34 06/01/21 20:41  Glucose-Capillary 70 - 99 mg/dL 416 (H) 384 (H) 536 (H) 107 (H) 120 (H)   Review of Glycemic Control  Diabetes history: DM2 Outpatient Diabetes medications: Glipizide XL 10 mg daily, Metformin 500 mg BID, Januvia 100 mg daily Current orders for Inpatient glycemic control: Semglee 6 units QHS, Novolog 0-15 units TID with meals  Inpatient Diabetes Program Recommendations:    Insulin: Fasting glucose 72 mg/dl today and CBG 57 mg/dl at 46:80 am today. Patient has been receiving Semglee 6 units QHS for past several days and no issues with hypoglycemia prior to today. May want to consider decreasing Semglee to 3 units QHS.  Thanks, Orlando Penner, RN, MSN, CDE Diabetes Coordinator Inpatient Diabetes Program [phone] (Team Pager from 8am to 5pm)

## 2021-06-02 NOTE — Progress Notes (Signed)
Pt removed dressing from lt BKA again around 0100 and began to dig into incision line.  Blood noted on pt's hand and along staple line.  Pt has been placed on mittens and frequently monitored by staff.

## 2021-06-02 NOTE — Progress Notes (Signed)
Physical Therapy Treatment Patient Details Name: Jose Schroeder MRN: [id number] DOB: Aug 01, 1932 Today's Date: 06/02/2021   History of Present Illness 88 y.o. male with medical history significant for dementia, uncontrolled DM, hTN, PAD, right BKA, presenting with altered mental status.  Patient was diagnosed with a right foot infection with gangrene, sepsis.  Vascular has attempted revascualrization to salvage limb, ultimately needed L BKA 2/7.    PT Comments    Pt known to this PT from eval last week, prior to L BKA 2/7.  He has baseline dementia but is now much more confused and struggled to follow instructions consistently this date.  With plenty of cuing and encouragement (PT and daughter) he did eventually show ability to perform exercises as requested but was inconsistent with some impulsivity and need for redirection.  He did display enough LE strength to do some limited side scooting in bed.    Recommendations for follow up therapy are one component of a multi-disciplinary discharge planning process, led by the attending physician.  Recommendations may be updated based on patient status, additional functional criteria and insurance authorization.  Follow Up Recommendations  Skilled nursing-short term rehab (<3 hours/day)     Assistance Recommended at Discharge Frequent or constant Supervision/Assistance  Patient can return home with the following Two people to help with walking and/or transfers;Two people to help with bathing/dressing/bathroom;Assistance with cooking/housework;Direct supervision/assist for medications management;Direct supervision/assist for financial management;Assist for transportation;Help with stairs or ramp for entrance   Equipment Recommendations       Recommendations for Other Services       Precautions / Restrictions Precautions Precautions: Fall Restrictions Weight Bearing Restrictions: Yes LLE Weight Bearing: Non weight bearing (acute L BKA)      Mobility  Bed Mobility Overal bed mobility: Needs Assistance Bed Mobility: Supine to Sit, Sit to Supine     Supine to sit: Min assist Sit to supine: Min assist   General bed mobility comments: Pt very confused today and struggled to consistently follow instructions.  He needed plenty of cuing to do even basic activity, ultimately was able to initiate movement to/from supine but struggled with full follow through.    Transfers Overall transfer level: Needs assistance                 General transfer comment: Not able or appropriate for sit to stand/pivot transfers as he does not have prosthetic and is very confused.  Heavy cuing for side scooting that initially did not translate to activity but with plenty of demonstration, encouragement and cuing he did manage to do a few small side scoots at EOB    Ambulation/Gait                   Stairs             Wheelchair Mobility    Modified Rankin (Stroke Patients Only)       Balance Overall balance assessment: Needs assistance Sitting-balance support: Bilateral upper extremity supported, Feet unsupported Sitting balance-Leahy Scale: Fair Sitting balance - Comments: Pt was able to maintain sitting EOB for ~5 minutes w/o assist or obvious LOBs.  During attempts at side-scoot/bed push up he did show poor awareness about scooting more to the EOB and did not seem to realize that he lacked any feet to help maintain status.  Cognition Arousal/Alertness: Awake/alert Behavior During Therapy: Restless, Impulsive Overall Cognitive Status: Impaired/Different from baseline                                 General Comments: Pt with baseline dementia, hardly oriented to self today, unoriented to situation, location, date, etc        Exercises General Exercises - Lower Extremity Quad Sets: Strengthening, 10 reps (struggled with initially engaging with  exercises, did ultimately start) Long Arc Quad: AROM, 10 reps (again initially struggled to perform as requested but did ultimately actively move through full ROM with b/l knees with consistent cuing) Hip ABduction/ADduction: Strengthening, 10 reps (similarly struggled to initially follow cuing but again did manage multiple reps with light resistance and continuous cuing) Straight Leg Raises: AROM, 10 reps    General Comments        Pertinent Vitals/Pain Pain Assessment Pain Assessment: Faces Faces Pain Scale: No hurt    Home Living                          Prior Function            PT Goals (current goals can now be found in the care plan section) Progress towards PT goals: Progressing toward goals    Frequency    Min 2X/week      PT Plan Current plan remains appropriate    Co-evaluation              AM-PAC PT "6 Clicks" Mobility   Outcome Measure  Help needed turning from your back to your side while in a flat bed without using bedrails?: None Help needed moving from lying on your back to sitting on the side of a flat bed without using bedrails?: None Help needed moving to and from a bed to a chair (including a wheelchair)?: A Lot Help needed standing up from a chair using your arms (e.g., wheelchair or bedside chair)?: Total Help needed to walk in hospital room?: Total Help needed climbing 3-5 steps with a railing? : Total 6 Click Score: 13    End of Session Equipment Utilized During Treatment: Gait belt Activity Tolerance:  (limited due to cognition but able to participate with consistent cuing) Patient left: with bed alarm set;with call bell/phone within reach;with family/visitor present Nurse Communication: Mobility status PT Visit Diagnosis: Muscle weakness (generalized) (M62.81);Difficulty in walking, not elsewhere classified (R26.2)     Time: 0086-7619 PT Time Calculation (min) (ACUTE ONLY): 27 min  Charges:  $Therapeutic Exercise:  8-22 mins $Therapeutic Activity: 8-22 mins                     Malachi Pro, DPT 06/02/2021, 1:42 PM

## 2021-06-02 NOTE — Progress Notes (Signed)
Pt confused and disoriented; pulled off LBKA dressing.  Bloody drainage noted to staple line. New dressing of ABD pad, kerlex and ace wrap applied.

## 2021-06-03 DIAGNOSIS — F03918 Unspecified dementia, unspecified severity, with other behavioral disturbance: Secondary | ICD-10-CM

## 2021-06-03 LAB — BASIC METABOLIC PANEL
Anion gap: 7 (ref 5–15)
BUN: 10 mg/dL (ref 8–23)
CO2: 28 mmol/L (ref 22–32)
Calcium: 8.3 mg/dL — ABNORMAL LOW (ref 8.9–10.3)
Chloride: 100 mmol/L (ref 98–111)
Creatinine, Ser: 0.55 mg/dL — ABNORMAL LOW (ref 0.61–1.24)
GFR, Estimated: >60 mL/min (ref >60)
Glucose, Bld: 111 mg/dL — ABNORMAL HIGH (ref 70–99)
Potassium: 4 mmol/L (ref 3.5–5.1)
Sodium: 135 mmol/L (ref 135–145)

## 2021-06-03 LAB — CBC
HCT: 26.8 % — ABNORMAL LOW (ref 39.0–52.0)
Hemoglobin: 8.4 g/dL — ABNORMAL LOW (ref 13.0–17.0)
MCH: 28.8 pg (ref 26.0–34.0)
MCHC: 31.3 g/dL (ref 30.0–36.0)
MCV: 91.8 fL (ref 80.0–100.0)
Platelets: 364 10*3/uL (ref 150–400)
RBC: 2.92 MIL/uL — ABNORMAL LOW (ref 4.22–5.81)
RDW: 14.6 % (ref 11.5–15.5)
WBC: 14.3 10*3/uL — ABNORMAL HIGH (ref 4.0–10.5)
nRBC: 0 % (ref 0.0–0.2)

## 2021-06-03 LAB — TYPE AND SCREEN
ABO/RH(D): A POS
Antibody Screen: NEGATIVE
Unit division: 0
Unit division: 0

## 2021-06-03 LAB — SURGICAL PATHOLOGY

## 2021-06-03 LAB — BPAM RBC
Blood Product Expiration Date: 202303012359
Blood Product Expiration Date: 202303122359
ISSUE DATE / TIME: 202302061503
Unit Type and Rh: 6200
Unit Type and Rh: 6200

## 2021-06-03 LAB — GLUCOSE, CAPILLARY
Glucose-Capillary: 88 mg/dL (ref 70–99)
Glucose-Capillary: 232 mg/dL — ABNORMAL HIGH (ref 70–99)
Glucose-Capillary: 316 mg/dL — ABNORMAL HIGH (ref 70–99)
Glucose-Capillary: 270 mg/dL — ABNORMAL HIGH (ref 70–99)

## 2021-06-03 LAB — PREPARE RBC (CROSSMATCH)

## 2021-06-03 MED ORDER — DOCUSATE SODIUM 100 MG PO CAPS
200.0000 mg | ORAL_CAPSULE | Freq: Two times a day (BID) | ORAL | Status: DC
  Administered 2021-06-03 – 2021-06-07 (×8): 200 mg via ORAL
  Filled 2021-06-03 (×9): qty 2

## 2021-06-03 MED ORDER — POLYETHYLENE GLYCOL 3350 17 G PO PACK
17.0000 g | PACK | Freq: Every day | ORAL | Status: DC
  Administered 2021-06-03 – 2021-06-07 (×3): 17 g via ORAL
  Filled 2021-06-03 (×3): qty 1

## 2021-06-03 NOTE — Progress Notes (Signed)
PROGRESS NOTE    Jose Schroeder  GUY:[id number] DOB: 18-Jun-1932 DOA: 05/24/2021 PCP: Juline Patch, MD    Assessment & Plan:   Principal Problem:   Sepsis [facility]) Active Problems:   Diabetes mellitus ([place])   Essential hypertension   Peripheral vascular disease ([place])   Anemia of chronic disease   Gangrene of left foot ([place])   Dementia without behavioral disturbance ([place])   Atherosclerosis of native artery of left lower extremity with gangrene ([place])   Sepsis: POA. See Dr. Marshia Ly note on how pt met sepsis criteria.  W/ left second toe gangrene. Blood cxs NGTD. Abxs were d/c on 06/01/21. Resolved   Left second toe gangrene: w/ progression to bottom & top of foot. S/p L BKA 06/01/21 as per vascular surg   Anemia of chronic disease: s/p 2 units of pRBCs transfused this admission so far. H&H are stable    Dementia: continue w/ supportive care   PVD: s/p angiogram w/ 2 stents placed & angioplasties as per vascular surg. Continue on plavix, statin    DM2: HbA1c 8.7, poorly controlled. Continue on glargine, SSI w/ accuchecks   HTN: continue on BB   DVT prophylaxis: will start when ok w/ vascular surg  Code Status: full  Family Communication: discussed pt's care w/ pt's wife, [name] and and answered her questions  Disposition Plan: will likely d/c to SNF   Level of care: Med-Surg  Status is: Inpatient Remains inpatient appropriate because: will need SNF placement once cleared by vascular surg      Consultants:  Vascular surg   Procedures: L BKA  Antimicrobials:    Subjective: Pt denies any complaints. Pt is oriented to person and place only   Objective: Vitals:   06/02/21 2035 06/02/21 2040 06/03/21 0733 06/03/21 0741  BP: (!) 145/65   (!) 154/73  Pulse: 89   89  Resp:      Temp:  98.2 F (36.8 C) 98.3 F (36.8 C) 98.3 F (36.8 C)  TempSrc:  Oral Axillary Axillary  SpO2:   100% 100%  Weight:      Height:        Intake/Output Summary (Last 24 hours)  at 06/03/2021 0801 Last data filed at 06/02/2021 1450 Gross per 24 hour  Intake --  Output 550 ml  Net -550 ml   Filed Weights   05/24/21 1249  Weight: 65 kg    Examination:  General exam: Appears comfortable  Respiratory system: clear breath sounds b/l  Cardiovascular system: S1/S2+. No rubs or clicks   Gastrointestinal system: Abd is soft, NT, ND & hypoactive bowel sounds  Central nervous system: alert and awake. Moves all extremities  Psychiatry: Judgement and insight appears poor. Flat mood and affect    Data Reviewed: I have personally reviewed following labs and imaging studies  CBC: Recent Labs  Lab 05/30/21 0919 05/31/21 0636 06/01/21 0355 06/02/21 0352 06/03/21 0449  WBC 17.2* 14.8* 11.8* 12.9* 14.3*  HGB 7.4* 6.6* 8.0* 8.4* 8.4*  HCT 23.7* 21.1* 24.8* 26.5* 26.8*  MCV 91.5 91.7 89.9 91.4 91.8  PLT 469* 375 345 359 563   Basic Metabolic Panel: Recent Labs  Lab 05/30/21 0426 05/31/21 0636 06/01/21 0355 06/02/21 0352 06/03/21 0449  NA  --   --  137 138 135  K  --   --  3.8 3.9 4.0  CL  --   --  103 104 100  CO2  --   --  _0 GLUCOSE  --   --  178* 84 111*  BUN  --   --  _0 CREATININE 0.64 0.84 0.74 0.64 0.55*  CALCIUM  --   --  8.4* 8.4* 8.3*   GFR: Estimated Creatinine Clearance: 60.9 mL/min (A) (by C-G formula based on SCr of 0.55 mg/dL (L)). Liver Function Tests: No results for input(s): AST, ALT, ALKPHOS, BILITOT, PROT, ALBUMIN in the last 168 hours. No results for input(s): LIPASE, AMYLASE in the last 168 hours. No results for input(s): AMMONIA in the last 168 hours. Coagulation Profile: No results for input(s): INR, PROTIME in the last 168 hours. Cardiac Enzymes: No results for input(s): CKTOTAL, CKMB, CKMBINDEX, TROPONINI in the last 168 hours. BNP (last 3 results) No results for input(s): PROBNP in the last 8760 hours. HbA1C: No results for input(s): HGBA1C in the last 72 hours. CBG: Recent Labs  Lab 06/02/21 1128  06/02/21 1150 06/02/21 1635 06/02/21 2038 06/03/21 0758  GLUCAP 57* 79 256* 190* 88   Lipid Profile: No results for input(s): CHOL, HDL, LDLCALC, TRIG, CHOLHDL, LDLDIRECT in the last 72 hours. Thyroid Function Tests: No results for input(s): TSH, T4TOTAL, FREET4, T3FREE, THYROIDAB in the last 72 hours. Anemia Panel: No results for input(s): VITAMINB12, FOLATE, FERRITIN, TIBC, IRON, RETICCTPCT in the last 72 hours. Sepsis Labs: No results for input(s): PROCALCITON, LATICACIDVEN in the last 168 hours.  Recent Results (from the past 240 hour(s))  Resp Panel by RT-PCR (Flu A&B, Covid) Nasopharyngeal Swab     Status: None   Collection Time: 05/24/21 12:52 PM   Specimen: Nasopharyngeal Swab; Nasopharyngeal(NP) swabs in vial transport medium  Result Value Ref Range Status   SARS Coronavirus 2 by RT PCR NEGATIVE NEGATIVE Final    Comment: (NOTE) SARS-CoV-2 target nucleic acids are NOT DETECTED.  The SARS-CoV-2 RNA is generally detectable in upper respiratory specimens during the acute phase of infection. The lowest concentration of SARS-CoV-2 viral copies this assay can detect is 138 copies/mL. A negative result does not preclude SARS-Cov-2 infection and should not be used as the sole basis for treatment or other patient management decisions. A negative result may occur with  improper specimen collection/handling, submission of specimen other than nasopharyngeal swab, presence of viral mutation(s) within the areas targeted by this assay, and inadequate number of viral copies(<138 copies/mL). A negative result must be combined with clinical observations, patient history, and epidemiological information. The expected result is Negative.  Fact Sheet for Patients:  EntrepreneurPulse.com.au  Fact Sheet for Healthcare Providers:  IncredibleEmployment.be  This test is no t yet approved or cleared by the [place] FDA and  has been authorized for  detection and/or diagnosis of SARS-CoV-2 by FDA under an Emergency Use Authorization (EUA). This EUA will remain  in effect (meaning this test can be used) for the duration of the COVID-19 declaration under Section 564(b)(1) of the Act, 21 U.S.C.section 360bbb-3(b)(1), unless the authorization is terminated  or revoked sooner.       Influenza A by PCR NEGATIVE NEGATIVE Final   Influenza B by PCR NEGATIVE NEGATIVE Final    Comment: (NOTE) The Xpert Xpress SARS-CoV-2/FLU/RSV plus assay is intended as an aid in the diagnosis of influenza from Nasopharyngeal swab specimens and should not be used as a sole basis for treatment. Nasal washings and aspirates are unacceptable for Xpert Xpress SARS-CoV-2/FLU/RSV testing.  Fact Sheet for Patients: EntrepreneurPulse.com.au  Fact Sheet for Healthcare Providers: IncredibleEmployment.be  This test is not yet approved or cleared by the [place] and has been authorized  for detection and/or diagnosis of SARS-CoV-2 by FDA under an Emergency Use Authorization (EUA). This EUA will remain in effect (meaning this test can be used) for the duration of the COVID-19 declaration under Section 564(b)(1) of the Act, 21 U.S.C. section 360bbb-3(b)(1), unless the authorization is terminated or revoked.  Performed at [facility], [address] [place], [place] [address]   Blood Culture (routine x 2)     Status: None   Collection Time: 05/24/21 12:52 PM   Specimen: BLOOD  Result Value Ref Range Status   Specimen Description BLOOD RIGHT ANTECUBITAL  Final   Special Requests   Final    BOTTLES DRAWN AEROBIC AND ANAEROBIC Blood Culture results may not be optimal due to an excessive volume of blood received in culture bottles   Culture   Final    NO GROWTH 5 DAYS Performed at [facility], [address] [place], [place] [address]    Report Status 05/29/2021 FINAL  Final  Blood Culture  (routine x 2)     Status: None   Collection Time: 05/24/21 12:52 PM   Specimen: BLOOD  Result Value Ref Range Status   Specimen Description BLOOD BLOOD RIGHT ARM  Final   Special Requests   Final    BOTTLES DRAWN AEROBIC AND ANAEROBIC Blood Culture adequate volume   Culture   Final    NO GROWTH 5 DAYS Performed at [facility], [address], [place], [place] [address]    Report Status 05/29/2021 FINAL  Final  Aerobic/Anaerobic Culture w Gram Stain (surgical/deep wound)     Status: None   Collection Time: 05/24/21  5:34 PM   Specimen: Wound  Result Value Ref Range Status   Specimen Description   Final    WOUND Performed at [facility], [address], [place], [place] [address]    Special Requests   Final    LEFT FOOT Performed at [facility], [address] [place], [place] [address]    Gram Stain   Final    NO SQUAMOUS EPITHELIAL CELLS SEEN FEW WBC SEEN FEW GRAM NEGATIVE RODS ABUNDANT GRAM POSITIVE COCCI    Culture   Final    ABUNDANT MULTIPLE ORGANISMS PRESENT, NONE PREDOMINANT FEW PREVOTELLA MELANINOGENICA BETA LACTAMASE POSITIVE Performed at [facility], [place] [address] [place], [place] [address]    Report Status 05/28/2021 FINAL  Final  Urine Culture     Status: None   Collection Time: 05/24/21 10:00 PM   Specimen: Urine, Random  Result Value Ref Range Status   Specimen Description   Final    URINE, RANDOM Performed at [facility], [address] [place], [place] [address]    Special Requests   Final    NONE Performed at [facility], [address], [place], [place] [address]    Culture   Final    NO GROWTH Performed at [facility], [place] [address], [place], [place] [address]    Report Status 05/26/2021 FINAL  Final  CULTURE, BLOOD (ROUTINE X 2) w Reflex to ID Panel     Status: None (Preliminary result)   Collection Time: 05/30/21  9:19 AM   Specimen: BLOOD  Result Value Ref Range  Status   Specimen Description BLOOD RH  Final   Special Requests   Final    BOTTLES DRAWN AEROBIC AND ANAEROBIC Blood Culture adequate volume   Culture   Final    NO GROWTH 3 DAYS  Performed at [facility], [address] [place], [place] [address]    Report Status PENDING  Incomplete  CULTURE, BLOOD (ROUTINE X 2) w Reflex to ID Panel     Status: None (Preliminary result)   Collection Time: 05/30/21  9:19 AM   Specimen: BLOOD  Result Value Ref Range Status   Specimen Description BLOOD LW  Final   Special Requests   Final    BOTTLES DRAWN AEROBIC AND ANAEROBIC Blood Culture adequate volume   Culture   Final    NO GROWTH 3 DAYS Performed at [facility], [address], [place], [place] [address]    Report Status PENDING  Incomplete         Radiology Studies: No results found.      Scheduled Meds:  amLODipine  5 mg Oral Daily   atorvastatin  20 mg Oral QPM   clopidogrel  75 mg Oral Q breakfast   insulin aspart  0-15 Units Subcutaneous TID WC   insulin aspart  0-5 Units Subcutaneous QHS   insulin glargine-yfgn  6 Units Subcutaneous QHS   iron polysaccharides  150 mg Oral Daily   metoprolol tartrate  50 mg Oral BID   sodium chloride flush  3 mL Intravenous Q12H   Continuous Infusions:  sodium chloride       LOS: 10 days    Time spent: 15 mins    Wyvonnia Dusky, MD [facility] Pager [phone]  If 7PM-7AM, please contact night-coverage 06/03/2021, 8:01 AM

## 2021-06-03 NOTE — Care Management Important Message (Signed)
Important Message  Patient Details  Name: Jose Schroeder MRN: [id number] Date of Birth: 1932/10/04   Medicare Important Message Given:  Yes     Juliann Pulse A Sonal Dorwart 06/03/2021, 11:22 AM

## 2021-06-03 NOTE — Progress Notes (Signed)
Physical Therapy Treatment Patient Details Name: Jose Schroeder MRN: [id number] DOB: [birth year] Today's Date: 06/03/2021   History of Present Illness 88 y.o. male with medical history significant for dementia, uncontrolled DM, hTN, PAD, right BKA, presenting with altered mental status.  Patient was diagnosed with a right foot infection with gangrene, sepsis.  Vascular has attempted revascualrization to salvage limb, ultimately needed L BKA 2/7.    PT Comments    Pt continues to have considerable cognitive issues that limit his ability to consistently stay on task getting through more than a few reps at a time during supine and seated exercises.  Similarly he was able to do a few side scoots at EOB but needed excessive reinforcement and repeated cuing to perform even one occasional scoot at a time.  Pt does have relatively good mobility but is was not safe/appropriate to attempt a transition to recliner due to mental status. This date.   Recommendations for follow up therapy are one component of a multi-disciplinary discharge planning process, led by the attending physician.  Recommendations may be updated based on patient status, additional functional criteria and insurance authorization.  Follow Up Recommendations  Skilled nursing-short term rehab (<3 hours/day)     Assistance Recommended at Discharge Frequent or constant Supervision/Assistance  Patient can return home with the following Two people to help with walking and/or transfers;Two people to help with bathing/dressing/bathroom;Assistance with cooking/housework;Direct supervision/assist for medications management;Direct supervision/assist for financial management;Assist for transportation;Help with stairs or ramp for entrance   Equipment Recommendations   (TBD at rehab)    Recommendations for Other Services       Precautions / Restrictions Precautions Precautions: Fall Restrictions Weight Bearing Restrictions: Yes LLE  Weight Bearing: Non weight bearing     Mobility  Bed Mobility Overal bed mobility: Needs Assistance Bed Mobility: Supine to Sit, Sit to Supine     Supine to sit: Min assist Sit to supine: Min assist   General bed mobility comments: Pt showed good effort with getting to EOB, but stalled half way and needed direct assist.  Similar situation on return to supine, able to initiate but not complete the movement    Transfers                   General transfer comment: Pt still not able to follow instructions well enough to hazard attempt at getting to recliner. Performed multiple side scooting at EOB efforts and pt was able to do so but was inconsistent and could not piece together consecutive bouts despite heavy cuing    Ambulation/Gait                   Stairs             Wheelchair Mobility    Modified Rankin (Stroke Patients Only)       Balance Overall balance assessment: Needs assistance Sitting-balance support: Bilateral upper extremity supported, Feet unsupported Sitting balance-Leahy Scale: Fair Sitting balance - Comments: Pt was able to maintain sitting EOB for ~10 minutes w/o assist or obvious LOBs.  During attempts at side-scoot/bed push up he did show decreased awareness about scooting more to the EOB and did not seem to realize that he lacks feet to help maintain balance during forward weight shifting.                                    Cognition Arousal/Alertness: Awake/alert Behavior  During Therapy: WFL for tasks assessed/performed Overall Cognitive Status: History of cognitive impairments - at baseline (Pt less impulsive and fidgity today but still with poor consistency following instructions)                                          Exercises General Exercises - Lower Extremity Quad Sets: Strengthening, 10 reps Short Arc Quad: AROM, Strengthening, 10 reps Hip ABduction/ADduction: Strengthening, 10  reps Straight Leg Raises: AROM, 10 reps Hip Flexion/Marching: AROM, 10 reps    General Comments        Pertinent Vitals/Pain Pain Assessment Pain Assessment: Faces Faces Pain Scale: No hurt    Home Living                          Prior Function            PT Goals (current goals can now be found in the care plan section) Progress towards PT goals: Progressing toward goals    Frequency    Min 2X/week      PT Plan Current plan remains appropriate    Co-evaluation              AM-PAC PT "6 Clicks" Mobility   Outcome Measure  Help needed turning from your back to your side while in a flat bed without using bedrails?: None Help needed moving from lying on your back to sitting on the side of a flat bed without using bedrails?: None Help needed moving to and from a bed to a chair (including a wheelchair)?: A Lot Help needed standing up from a chair using your arms (e.g., wheelchair or bedside chair)?: Total Help needed to walk in hospital room?: Total Help needed climbing 3-5 steps with a railing? : Total 6 Click Score: 13    End of Session Equipment Utilized During Treatment: Gait belt Activity Tolerance: Patient tolerated treatment well Patient left: with bed alarm set;with call bell/phone within reach;with family/visitor present Nurse Communication: Mobility status PT Visit Diagnosis: Muscle weakness (generalized) (M62.81);Difficulty in walking, not elsewhere classified (R26.2)     Time: 8099-8338 PT Time Calculation (min) (ACUTE ONLY): 25 min  Charges:  $Therapeutic Exercise: 8-22 mins $Therapeutic Activity: 8-22 mins                     Malachi Pro, DPT 06/03/2021, 6:16 PM

## 2021-06-03 NOTE — TOC Progression Note (Signed)
Transition of Care [facility]) - Progression Note    Patient Details  Name: Jose Schroeder MRN: [id number] Date of Birth: 1932/06/04  [facility] CM/SW Contact  Marlowe Sax, RN Phone Number: 06/03/2021, 10:44 AM  Clinical Narrative:   Left a general voice asking for a call back, Need to review the one bed offer and see if she would like to expand the search to other areas    Expected Discharge Plan: Skilled Nursing Facility Barriers to Discharge: Continued Medical Work up  Expected Discharge Plan and Services Expected Discharge Plan: Skilled Nursing Facility       Living arrangements for the past 2 months: Single Family Home                                       Social Determinants of Health (SDOH) Interventions    Readmission Risk Interventions No flowsheet data found.

## 2021-06-03 NOTE — TOC Progression Note (Signed)
Transition of Care [facility]) - Progression Note    Patient Details  Name: Jose Schroeder MRN: [id number] Date of Birth: Jan 22, 1933  Transition of Care [facility]) CM/SW [place], RN Phone Number: 06/03/2021, 10:49 AM  Clinical Narrative:    Spoke with the wife [name] and reviewed the bed offers, she accepted [facility], Ins process started to get ins approval   Expected Discharge Plan: [place] Barriers to Discharge: Continued Medical Work up  Expected Discharge Plan and Services Expected Discharge Plan: [place] arrangements for the past 2 months: Single Family Home                                       Social Determinants of Health (SDOH) Interventions    Readmission Risk Interventions No flowsheet data found.

## 2021-06-03 NOTE — Progress Notes (Signed)
Inpatient Diabetes Program Recommendations  AACE/ADA: New Consensus Statement on Inpatient Glycemic Control (2015)  Target Ranges:  Prepandial:   less than 140 mg/dL      [facility] postprandial:   less than 180 mg/dL (1-2 hours)      Critically ill patients:  140 - 180 mg/dL    Latest Reference Range & Units 06/02/21 07:32 06/02/21 11:28 06/02/21 11:50 06/02/21 16:35 06/02/21 20:38  Glucose-Capillary 70 - 99 mg/dL 72 57 (L) 79 209 (H)  8 units Novolog  190 (H)    6 units Semglee    Latest Reference Range & Units 06/03/21 07:58  Glucose-Capillary 70 - 99 mg/dL 88        Home DM Meds: Glipizide XL 10 mg daily      Metformin 500 mg BID      Januvia 100 mg daily  Current Orders: Semglee 6 units QHS      Novolog Moderate Correction Scale/ SSI (0-15 units) TID AC + HS     MD- Note CBG only 72 yesterday AM.  Hypo event at 11:28am yest (CBG 57).  CBG only 88 this AM.  Please consider stopping Semglee for now    Continue Novolog SSI    --Will follow patient during hospitalization--  Ambrose Finland RN, MSN, CDE Diabetes Coordinator Inpatient Glycemic Control Team Team Pager: [phone] (8a-5p)

## 2021-06-04 LAB — CULTURE, BLOOD (ROUTINE X 2)
Culture: NO GROWTH
Culture: NO GROWTH
Special Requests: ADEQUATE
Special Requests: ADEQUATE

## 2021-06-04 LAB — CBC
HCT: 28.3 % — ABNORMAL LOW (ref 39.0–52.0)
Hemoglobin: 8.9 g/dL — ABNORMAL LOW (ref 13.0–17.0)
MCH: 29 pg (ref 26.0–34.0)
MCHC: 31.4 g/dL (ref 30.0–36.0)
MCV: 92.2 fL (ref 80.0–100.0)
Platelets: 373 10*3/uL (ref 150–400)
RBC: 3.07 MIL/uL — ABNORMAL LOW (ref 4.22–5.81)
RDW: 14.4 % (ref 11.5–15.5)
WBC: 13.5 10*3/uL — ABNORMAL HIGH (ref 4.0–10.5)
nRBC: 0 % (ref 0.0–0.2)

## 2021-06-04 LAB — BASIC METABOLIC PANEL
Anion gap: 8 (ref 5–15)
BUN: 8 mg/dL (ref 8–23)
CO2: 28 mmol/L (ref 22–32)
Calcium: 8.6 mg/dL — ABNORMAL LOW (ref 8.9–10.3)
Chloride: 99 mmol/L (ref 98–111)
Creatinine, Ser: 0.56 mg/dL — ABNORMAL LOW (ref 0.61–1.24)
GFR, Estimated: >60 mL/min (ref >60)
Glucose, Bld: 115 mg/dL — ABNORMAL HIGH (ref 70–99)
Potassium: 4 mmol/L (ref 3.5–5.1)
Sodium: 135 mmol/L (ref 135–145)

## 2021-06-04 LAB — GLUCOSE, CAPILLARY
Glucose-Capillary: 141 mg/dL — ABNORMAL HIGH (ref 70–99)
Glucose-Capillary: 274 mg/dL — ABNORMAL HIGH (ref 70–99)
Glucose-Capillary: 133 mg/dL — ABNORMAL HIGH (ref 70–99)

## 2021-06-04 NOTE — Progress Notes (Signed)
Pt too confused and combative to obtain vital signs

## 2021-06-04 NOTE — Progress Notes (Signed)
Physical Therapy Treatment Patient Details Name: Jose Schroeder MRN: [id number] DOB: February 18, 1933 Today's Date: 06/04/2021   History of Present Illness 88 y.o. male with medical history significant for dementia, uncontrolled DM, hTN, PAD, right BKA, presenting with altered mental status.  Patient was diagnosed with a right foot infection with gangrene, sepsis.  Vascular has attempted revascualrization to salvage limb, ultimately needed L BKA 2/7.    PT Comments    Patient alert, oriented to self, asked to be called "KC", no pain signs/symptoms noted. Overall improvement in participation with therapy today. The patient was able to transfer to EOB with minA for trunk elevation, and able to sit for several minutes for medication administration from RN, supervision. Lateral scoot transfer to the R with modA, intermittent cueing for hand placement but ultimately pt able to participate with great effort.  Up in chair with all needs in reach, chair alarm in place. Pt educated on use of call bell. The patient would benefit from further skilled PT intervention to continue to progress towards goals. Recommendation remains appropriate.    Recommendations for follow up therapy are one component of a multi-disciplinary discharge planning process, led by the attending physician.  Recommendations may be updated based on patient status, additional functional criteria and insurance authorization.  Follow Up Recommendations  Skilled nursing-short term rehab (<3 hours/day)     Assistance Recommended at Discharge Frequent or constant Supervision/Assistance  Patient can return home with the following Two people to help with bathing/dressing/bathroom;Assistance with cooking/housework;Direct supervision/assist for medications management;Direct supervision/assist for financial management;Assist for transportation;Help with stairs or ramp for entrance;A lot of help with walking and/or transfers   Equipment  Recommendations  Other (comment) (TBD)    Recommendations for Other Services  OT     Precautions / Restrictions Precautions Precautions: Fall Restrictions Weight Bearing Restrictions: No     Mobility  Bed Mobility Overal bed mobility: Needs Assistance Bed Mobility: Supine to Sit, Sit to Supine     Supine to sit: Min assist     General bed mobility comments: trunk elevation minA, maxA to reposition into midline at EOB    Transfers Overall transfer level: Needs assistance   Transfers: Bed to chair/wheelchair/BSC            Lateral/Scoot Transfers: Mod assist, From elevated surface      Ambulation/Gait               General Gait Details: deferred   Stairs             Wheelchair Mobility    Modified Rankin (Stroke Patients Only)       Balance Overall balance assessment: Needs assistance Sitting-balance support: Bilateral upper extremity supported, Feet unsupported Sitting balance-Leahy Scale: Fair Sitting balance - Comments: Pt was able to maintain sitting EOB for ~10 minutes w/o assist or obvious LOBs. able to medication from RN with supervision                                    Cognition Arousal/Alertness: Awake/alert Behavior During Therapy: WFL for tasks assessed/performed Overall Cognitive Status: History of cognitive impairments - at baseline                                 General Comments: oriented to self only        Exercises Other Exercises Other Exercises: seated  marching x10 constant multimodal cues needed    General Comments        Pertinent Vitals/Pain Pain Assessment Pain Assessment: Faces Faces Pain Scale: No hurt    Home Living                          Prior Function            PT Goals (current goals can now be found in the care plan section) Progress towards PT goals: Progressing toward goals    Frequency    Min 2X/week      PT Plan Current plan  remains appropriate    Co-evaluation              AM-PAC PT "6 Clicks" Mobility   Outcome Measure  Help needed turning from your back to your side while in a flat bed without using bedrails?: None Help needed moving from lying on your back to sitting on the side of a flat bed without using bedrails?: A Little Help needed moving to and from a bed to a chair (including a wheelchair)?: A Lot Help needed standing up from a chair using your arms (e.g., wheelchair or bedside chair)?: Total Help needed to walk in hospital room?: Total Help needed climbing 3-5 steps with a railing? : Total 6 Click Score: 12    End of Session Equipment Utilized During Treatment: Gait belt Activity Tolerance: Patient tolerated treatment well Patient left: with chair alarm set;with call bell/phone within reach;in chair (posey chair lap belt alarm) Nurse Communication: Mobility status PT Visit Diagnosis: Muscle weakness (generalized) (M62.81);Difficulty in walking, not elsewhere classified (R26.2)     Time: 7425-9563 PT Time Calculation (min) (ACUTE ONLY): 20 min  Charges:  $Therapeutic Activity: 8-22 mins                     Olga Coaster PT, DPT 11:13 AM,06/04/21

## 2021-06-04 NOTE — Evaluation (Signed)
Occupational Therapy Evaluation Patient Details Name: Jose Schroeder MRN: [id number] DOB: 12-08-1932 Today's Date: 06/04/2021   History of Present Illness 88 y.o. male with medical history significant for dementia, uncontrolled DM, hTN, PAD, right BKA, presenting with altered mental status.  Patient was diagnosed with a right foot infection with gangrene, sepsis.  Vascular has attempted revascualrization to salvage limb, ultimately needed L BKA 2/7.   Clinical Impression   Jose Schroeder was seen for OT evaluation this date. Pt is poor historian, per chart lives with family, previously mobile using prosthetic + AD. Pt presents to acute OT demonstrating impaired ADL performance and functional mobility 2/2 decreased activity tolerance, poor command following, and functional strength/ROM/balance deficits. Pt currently restless, upon arrival pt had removed amuptation dressing and was tearing menu into pieces. Pt significantly limited by cognition however pleasant t/o and easy to redirect. MOD A for lateral scoot t/f chair>bed. RN in room to re-dress amputation site, session ended, will continue to assess ADLs as able. Pt would benefit from skilled OT to address noted impairments and functional limitations (see below for any additional details). Upon hospital discharge, recommend STR to maximize pt safety and return to PLOF.     Recommendations for follow up therapy are one component of a multi-disciplinary discharge planning process, led by the attending physician.  Recommendations may be updated based on patient status, additional functional criteria and insurance authorization.   Follow Up Recommendations  Skilled nursing-short term rehab (<3 hours/day)    Assistance Recommended at Discharge Frequent or constant Supervision/Assistance  Patient can return home with the following A lot of help with walking and/or transfers;A lot of help with bathing/dressing/bathroom;Assistance with feeding     Functional Status Assessment  Patient has had a recent decline in their functional status and demonstrates the ability to make significant improvements in function in a reasonable and predictable amount of time.  Equipment Recommendations  BSC/3in1    Recommendations for Other Services       Precautions / Restrictions Precautions Precautions: Fall Restrictions Weight Bearing Restrictions: No LLE Weight Bearing: Non weight bearing      Mobility Bed Mobility Overal bed mobility: Needs Assistance Bed Mobility: Sit to Supine       Sit to supine: Min guard        Transfers Overall transfer level: Needs assistance   Transfers: Bed to chair/wheelchair/BSC            Lateral/Scoot Transfers: Mod assist        Balance Overall balance assessment: Needs assistance Sitting-balance support: Bilateral upper extremity supported, Feet unsupported Sitting balance-Leahy Scale: Fair                                     ADL either performed or assessed with clinical judgement   ADL Overall ADL's : Needs assistance/impaired                                       General ADL Comments: Pt significantly limited by cognition however pleasant t/o and easy to redirect. Upon arrival pt had removed amuptation dressing and was tearing menu into pieces. MOD A for lateral scoot t/f      Pertinent Vitals/Pain Pain Assessment Pain Assessment: No/denies pain     Hand Dominance Right   Extremity/Trunk Assessment Upper Extremity Assessment Upper Extremity Assessment:  Overall [facility] for tasks assessed   Lower Extremity Assessment Lower Extremity Assessment: Overall WFL for tasks assessed       Communication Communication Communication: No difficulties   Cognition Arousal/Alertness: Awake/alert Behavior During Therapy: WFL for tasks assessed/performed Overall Cognitive Status: History of cognitive impairments - at baseline                                  General Comments: oriented to self only     General Comments       Exercises     Shoulder Instructions      Home Living Family/patient expects to be discharged to:: Unsure                                 Additional Comments: pt too confused to answer; prior documentation states lives with wife      Prior Functioning/Environment Prior Level of Function : Patient poor historian/Family not available             Mobility Comments: pt too confused to answer questions, prior documentation indicates that ~4 months ago he was walking with cane and R BKA prosthetic          OT Problem List: Decreased strength;Decreased range of motion;Decreased activity tolerance;Impaired balance (sitting and/or standing);Decreased safety awareness      OT Treatment/Interventions: Self-care/ADL training;Therapeutic exercise;Energy conservation;DME and/or AE instruction;Therapeutic activities;Balance training;Patient/family education    OT Goals(Current goals can be found in the care plan section) Acute Rehab OT Goals Patient Stated Goal: non stated OT Goal Formulation: With patient Time For Goal Achievement: 06/18/21 Potential to Achieve Goals: Fair ADL Goals Pt Will Perform Grooming: with supervision;sitting Pt Will Perform Lower Body Dressing: with min guard assist;with caregiver independent in assisting;sitting/lateral leans Pt Will Transfer to Toilet: with min guard assist;with transfer board  OT Frequency: Min 2X/week    Co-evaluation              AM-PAC OT "6 Clicks" Daily Activity     Outcome Measure Help from another person eating meals?: A Little Help from another person taking care of personal grooming?: A Little Help from another person toileting, which includes using toliet, bedpan, or urinal?: A Lot Help from another person bathing (including washing, rinsing, drying)?: A Lot Help from another person to put on and taking off regular  upper body clothing?: A Little Help from another person to put on and taking off regular lower body clothing?: A Lot 6 Click Score: 15   End of Session Nurse Communication: Mobility status  Activity Tolerance: Patient tolerated treatment well Patient left: in bed;with call bell/phone within reach;with bed alarm set;with nursing/sitter in room  OT Visit Diagnosis: Unsteadiness on feet (R26.81)                Time: 7846-9629 OT Time Calculation (min): 7 min Charges:  OT General Charges $OT Visit: 1 Visit OT Evaluation $OT Eval Low Complexity: 1 Low  Kathie Dike, M.S. OTR/L  06/04/21, 3:59 PM  ascom [phone]

## 2021-06-04 NOTE — Progress Notes (Signed)
PROGRESS NOTE    Jose Schroeder  [id number] DOB: Jul 28, 1932 DOA: 05/24/2021 PCP: Juline Patch, MD    Assessment & Plan:   Principal Problem:   Sepsis [facility]) Active Problems:   Diabetes mellitus ([place])   Essential hypertension   Peripheral vascular disease ([place])   Anemia of chronic disease   Gangrene of left foot ([place])   Dementia without behavioral disturbance ([place])   Atherosclerosis of native artery of left lower extremity with gangrene ([place])   Sepsis: POA. See Dr. Marshia Ly note on how pt met sepsis criteria.  W/ left second toe gangrene. Blood cxs NGTD. Abxs were d/c on 06/01/21. Resolved    Left second toe gangrene: w/ progression to bottom & top of foot. 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 H&H are stable    Dementia: continue w/ supportive care   PVD: s/p angiogram w/ 2 stents placed & angioplasties as per vascular surg. Continue on plavix, statin    DM2: poorly controlled, HbA1c 8.7. Continue on glargine, SSI w/ accuchecks   HTN: continue on metoprolol    DVT prophylaxis: will start when ok w/ vascular surg  Code Status: full  Family Communication: Disposition Plan: will likely d/c to SNF   Level of care: Med-Surg  Status is: Inpatient Remains inpatient appropriate because: will need SNF placement once cleared by vascular surg      Consultants:  Vascular surg   Procedures: L BKA  Antimicrobials:    Subjective: Pt c/o fatigue   Objective: Vitals:   06/03/21 0741 06/03/21 1145 06/03/21 1527 06/03/21 2010  BP: (!) 154/73 (!) 142/66 137/62 (!) 155/78  Pulse: 89 95 88 (!) 103  Resp: $Remo'15 14 16 15  'GVTKT$ Temp: 98.3 F (36.8 C) 98.1 F (36.7 C) (!) 101 F (38.3 C) 98.7 F (37.1 C)  TempSrc: Axillary Oral Oral   SpO2: 100% 100% 100% 100%  Weight:      Height:        Intake/Output Summary (Last 24 hours) at 06/04/2021 0646 Last data filed at 06/03/2021  1842 Gross per 24 hour  Intake 0 ml  Output --  Net 0 ml   Filed Weights   05/24/21 1249  Weight: 65 kg    Examination:  General exam: Appears calm & comfortable  Respiratory system: clear breath sounds b/l  Cardiovascular system: S1 & S2+. No rubs or gallops  Gastrointestinal system: Abd is soft, NT, ND & hypoactive bowel sounds Central nervous system:alert and awake. Moves all extremities  Psychiatry: Judgement and insight appears normal. Flat mood and affect    Data Reviewed: I have personally reviewed following labs and imaging studies  CBC: Recent Labs  Lab 05/31/21 0636 06/01/21 0355 06/02/21 0352 06/03/21 0449 06/04/21 0500  WBC 14.8* 11.8* 12.9* 14.3* 13.5*  HGB 6.6* 8.0* 8.4* 8.4* 8.9*  HCT 21.1* 24.8* 26.5* 26.8* 28.3*  MCV 91.7 89.9 91.4 91.8 92.2  PLT 375 345 359 364 465   Basic Metabolic Panel: Recent Labs  Lab 05/31/21 0636 06/01/21 0355 06/02/21 0352 06/03/21 0449 06/04/21 0500  NA  --  137 138 135 135  K  --  3.8 3.9 4.0 4.0  CL  --  103 104 100 99  CO2  --  $R'28 28 28 28  'jL$ GLUCOSE  --  178* 84 111* 115*  BUN  --  $R'11 9 10 8  'uk$ CREATININE 0.84 0.74 0.64  0.55* 0.56*  CALCIUM  --  8.4* 8.4* 8.3* 8.6*   GFR: Estimated Creatinine Clearance: 60.9 mL/min (A) (by C-G formula based on SCr of 0.56 mg/dL (L)). Liver Function Tests: No results for input(s): AST, ALT, ALKPHOS, BILITOT, PROT, ALBUMIN in the last 168 hours. No results for input(s): LIPASE, AMYLASE in the last 168 hours. No results for input(s): AMMONIA in the last 168 hours. Coagulation Profile: No results for input(s): INR, PROTIME in the last 168 hours. Cardiac Enzymes: No results for input(s): CKTOTAL, CKMB, CKMBINDEX, TROPONINI in the last 168 hours. BNP (last 3 results) No results for input(s): PROBNP in the last 8760 hours. HbA1C: No results for input(s): HGBA1C in the last 72 hours. CBG: Recent Labs  Lab 06/02/21 2038 06/03/21 0758 06/03/21 1149 06/03/21 1634  06/03/21 2007  GLUCAP 190* 88 232* 316* 270*   Lipid Profile: No results for input(s): CHOL, HDL, LDLCALC, TRIG, CHOLHDL, LDLDIRECT in the last 72 hours. Thyroid Function Tests: No results for input(s): TSH, T4TOTAL, FREET4, T3FREE, THYROIDAB in the last 72 hours. Anemia Panel: No results for input(s): VITAMINB12, FOLATE, FERRITIN, TIBC, IRON, RETICCTPCT in the last 72 hours. Sepsis Labs: No results for input(s): PROCALCITON, LATICACIDVEN in the last 168 hours.  Recent Results (from the past 240 hour(s))  CULTURE, BLOOD (ROUTINE X 2) w Reflex to ID Panel     Status: None (Preliminary result)   Collection Time: 05/30/21  9:19 AM   Specimen: BLOOD  Result Value Ref Range Status   Specimen Description BLOOD RH  Final   Special Requests   Final    BOTTLES DRAWN AEROBIC AND ANAEROBIC Blood Culture adequate volume   Culture   Final    NO GROWTH 4 DAYS Performed at [facility], [address], [place], [place] [address]    Report Status PENDING  Incomplete  CULTURE, BLOOD (ROUTINE X 2) w Reflex to ID Panel     Status: None (Preliminary result)   Collection Time: 05/30/21  9:19 AM   Specimen: BLOOD  Result Value Ref Range Status   Specimen Description BLOOD LW  Final   Special Requests   Final    BOTTLES DRAWN AEROBIC AND ANAEROBIC Blood Culture adequate volume   Culture   Final    NO GROWTH 4 DAYS Performed at [facility], [address] [place], [place] [address]    Report Status PENDING  Incomplete         Radiology Studies: No results found.      Scheduled Meds:  amLODipine  5 mg Oral Daily   atorvastatin  20 mg Oral QPM   clopidogrel  75 mg Oral Q breakfast   docusate sodium  200 mg Oral BID   insulin aspart  0-15 Units Subcutaneous TID WC   insulin aspart  0-5 Units Subcutaneous QHS   insulin glargine-yfgn  6 Units Subcutaneous QHS   iron polysaccharides  150 mg Oral Daily   metoprolol tartrate  50 mg Oral BID   polyethylene glycol  17  g Oral Daily   sodium chloride flush  3 mL Intravenous Q12H   Continuous Infusions:  sodium chloride       LOS: 11 days    Time spent: 15 mins    Wyvonnia Dusky, MD [facility] Pager [phone]  If 7PM-7AM, please contact night-coverage 06/04/2021, 6:46 AM

## 2021-06-05 LAB — CBC
HCT: 28.4 % — ABNORMAL LOW (ref 39.0–52.0)
Hemoglobin: 8.9 g/dL — ABNORMAL LOW (ref 13.0–17.0)
MCH: 28.9 pg (ref 26.0–34.0)
MCHC: 31.3 g/dL (ref 30.0–36.0)
MCV: 92.2 fL (ref 80.0–100.0)
Platelets: 400 10*3/uL (ref 150–400)
RBC: 3.08 MIL/uL — ABNORMAL LOW (ref 4.22–5.81)
RDW: 14.4 % (ref 11.5–15.5)
WBC: 12.4 10*3/uL — ABNORMAL HIGH (ref 4.0–10.5)
nRBC: 0 % (ref 0.0–0.2)

## 2021-06-05 LAB — GLUCOSE, CAPILLARY
Glucose-Capillary: 122 mg/dL — ABNORMAL HIGH (ref 70–99)
Glucose-Capillary: 174 mg/dL — ABNORMAL HIGH (ref 70–99)

## 2021-06-05 LAB — BASIC METABOLIC PANEL
Anion gap: 10 (ref 5–15)
BUN: 11 mg/dL (ref 8–23)
CO2: 28 mmol/L (ref 22–32)
Calcium: 8.8 mg/dL — ABNORMAL LOW (ref 8.9–10.3)
Chloride: 102 mmol/L (ref 98–111)
Creatinine, Ser: 0.66 mg/dL (ref 0.61–1.24)
GFR, Estimated: >60 mL/min (ref >60)
Glucose, Bld: 101 mg/dL — ABNORMAL HIGH (ref 70–99)
Potassium: 3.7 mmol/L (ref 3.5–5.1)
Sodium: 140 mmol/L (ref 135–145)

## 2021-06-05 MED ORDER — ENOXAPARIN SODIUM 40 MG/0.4ML IJ SOSY
40.0000 mg | PREFILLED_SYRINGE | INTRAMUSCULAR | Status: DC
  Administered 2021-06-06 (×2): 40 mg via SUBCUTANEOUS
  Filled 2021-06-05 (×2): qty 0.4

## 2021-06-05 NOTE — Progress Notes (Signed)
PROGRESS NOTE    Jose Schroeder  [id number]:[id number] DOB: [birth year] DOA: 05/24/2021 PCP: Juline Patch, MD    Assessment & Plan:   Principal Problem:   Sepsis [facility]) Active Problems:   Diabetes mellitus ([place])   Essential hypertension   Peripheral vascular disease ([place])   Anemia of chronic disease   Gangrene of left foot ([place])   Dementia without behavioral disturbance ([place])   Atherosclerosis of native artery of left lower extremity with gangrene ([place])   Sepsis: POA. See Dr. Marshia Ly note on how pt met sepsis criteria.  W/ left second toe gangrene. Blood cxs NGTD. Abxs were d/c on 06/01/21. Resolved    Left second toe gangrene: w/ progression to bottom & top of foot. 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 Dressing changed today as per vascular surg   Anemia of chronic disease: H&H are stable. S/p 2 units of pRBCs transfused so far    Dementia: continue w/ supportive care   PVD: s/p angiogram w/ 2 stents placed & angioplasties as per vascular surg. Continue on plavix, statin    DM2: HbA1c 8.7, poorly controlled. Continue on glargine, SSI w/ accuchecks  HTN: continue on BB    DVT prophylaxis: lovenox  Code Status: full  Family Communication: Disposition Plan: will likely d/c to SNF   Level of care: Med-Surg  Status is: Inpatient Remains inpatient appropriate because: will need SNF placement once cleared by vascular surg      Consultants:  Vascular surg   Procedures: L BKA  Antimicrobials:    Subjective: Pt c/o malaise   Objective: Vitals:   06/04/21 2050 06/04/21 2051 06/05/21 0450 06/05/21 0749  BP: (!) 141/75 (!) 141/75 132/62 (!) 159/76  Pulse: (!) 102 (!) 101 91 94  Resp: _0 Temp: 98.5 F (36.9 C)  98 F (36.7 C) 97.6 F (36.4 C)  TempSrc: Oral     SpO2: 100% 100% 100% 100%  Weight:      Height:       No intake or output data in the 24 hours ending 06/05/21 0758  Filed Weights   05/24/21 1249  Weight: 65 kg     Examination:  General exam: Appears comfortable  Respiratory system: clear breath sounds b/. No rales, wheezes Cardiovascular system: S1/S2+. No rubs or gallops  Gastrointestinal system: Abd is soft, NT, ND  & hypoactive bowel sounds  Central nervous system:a alert and awake. Moves all extremities  Psychiatry: judgement and insight appears at baseline. Moves all extremities     Data Reviewed: I have personally reviewed following labs and imaging studies  CBC: Recent Labs  Lab 06/01/21 0355 06/02/21 0352 06/03/21 0449 06/04/21 0500 06/05/21 0440  WBC 11.8* 12.9* 14.3* 13.5* 12.4*  HGB 8.0* 8.4* 8.4* 8.9* 8.9*  HCT 24.8* 26.5* 26.8* 28.3* 28.4*  MCV 89.9 91.4 91.8 92.2 92.2  PLT 345 359 364 373 761   Basic Metabolic Panel: Recent Labs  Lab 06/01/21 0355 06/02/21 0352 06/03/21 0449 06/04/21 0500 06/05/21 0440  NA 137 138 135 135 140  K 3.8 3.9 4.0 4.0 3.7  CL 103 104 100 99 102  CO2 _1 GLUCOSE 178* 84 111* 115* 101*  BUN _2 CREATININE 0.74 0.64 0.55* 0.56* 0.66  CALCIUM 8.4* 8.4* 8.3* 8.6* 8.8*   GFR: Estimated Creatinine Clearance: 60.9 mL/min (by C-G formula based on SCr of 0.66 mg/dL). Liver Function Tests: No results for input(s): AST, ALT,  ALKPHOS, BILITOT, PROT, ALBUMIN in the last 168 hours. No results for input(s): LIPASE, AMYLASE in the last 168 hours. No results for input(s): AMMONIA in the last 168 hours. Coagulation Profile: No results for input(s): INR, PROTIME in the last 168 hours. Cardiac Enzymes: No results for input(s): CKTOTAL, CKMB, CKMBINDEX, TROPONINI in the last 168 hours. BNP (last 3 results) No results for input(s): PROBNP in the last 8760 hours. HbA1C: No results for input(s): HGBA1C in the last 72 hours. CBG: Recent Labs  Lab 06/03/21 1634 06/03/21 2007 06/04/21 0816 06/04/21 1706 06/04/21 2100  GLUCAP 316* 270* 141* 274* 133*   Lipid Profile: No results for input(s): CHOL, HDL, LDLCALC, TRIG,  CHOLHDL, LDLDIRECT in the last 72 hours. Thyroid Function Tests: No results for input(s): TSH, T4TOTAL, FREET4, T3FREE, THYROIDAB in the last 72 hours. Anemia Panel: No results for input(s): VITAMINB12, FOLATE, FERRITIN, TIBC, IRON, RETICCTPCT in the last 72 hours. Sepsis Labs: No results for input(s): PROCALCITON, LATICACIDVEN in the last 168 hours.  Recent Results (from the past 240 hour(s))  CULTURE, BLOOD (ROUTINE X 2) w Reflex to ID Panel     Status: None   Collection Time: 05/30/21  9:19 AM   Specimen: BLOOD  Result Value Ref Range Status   Specimen Description BLOOD RH  Final   Special Requests   Final    BOTTLES DRAWN AEROBIC AND ANAEROBIC Blood Culture adequate volume   Culture   Final    NO GROWTH 5 DAYS Performed at [facility], [address] [place], [place] [address]    Report Status 06/04/2021 FINAL  Final  CULTURE, BLOOD (ROUTINE X 2) w Reflex to ID Panel     Status: None   Collection Time: 05/30/21  9:19 AM   Specimen: BLOOD  Result Value Ref Range Status   Specimen Description BLOOD LW  Final   Special Requests   Final    BOTTLES DRAWN AEROBIC AND ANAEROBIC Blood Culture adequate volume   Culture   Final    NO GROWTH 5 DAYS Performed at [facility], [address], [place], [place] [address]    Report Status 06/04/2021 FINAL  Final         Radiology Studies: No results found.      Scheduled Meds:  amLODipine  5 mg Oral Daily   atorvastatin  20 mg Oral QPM   clopidogrel  75 mg Oral Q breakfast   docusate sodium  200 mg Oral BID   insulin aspart  0-15 Units Subcutaneous TID WC   insulin aspart  0-5 Units Subcutaneous QHS   insulin glargine-yfgn  6 Units Subcutaneous QHS   iron polysaccharides  150 mg Oral Daily   metoprolol tartrate  50 mg Oral BID   polyethylene glycol  17 g Oral Daily   sodium chloride flush  3 mL Intravenous Q12H   Continuous Infusions:  sodium chloride       LOS: 12 days    Time spent: 15  mins    Wyvonnia Dusky, MD [facility] Pager [phone]  If 7PM-7AM, please contact night-coverage 06/05/2021, 7:58 AM

## 2021-06-05 NOTE — Progress Notes (Signed)
POD#4 L BKA:  Stump dressing removed today. CDI, may use knee immobilizer as necessary to prevent contracture.

## 2021-06-06 LAB — CBC
HCT: 26.5 % — ABNORMAL LOW (ref 39.0–52.0)
Hemoglobin: 8.2 g/dL — ABNORMAL LOW (ref 13.0–17.0)
MCH: 28.2 pg (ref 26.0–34.0)
MCHC: 30.9 g/dL (ref 30.0–36.0)
MCV: 91.1 fL (ref 80.0–100.0)
Platelets: 411 10*3/uL — ABNORMAL HIGH (ref 150–400)
RBC: 2.91 MIL/uL — ABNORMAL LOW (ref 4.22–5.81)
RDW: 14.6 % (ref 11.5–15.5)
WBC: 11.9 10*3/uL — ABNORMAL HIGH (ref 4.0–10.5)
nRBC: 0 % (ref 0.0–0.2)

## 2021-06-06 LAB — GLUCOSE, CAPILLARY
Glucose-Capillary: 114 mg/dL — ABNORMAL HIGH (ref 70–99)
Glucose-Capillary: 114 mg/dL — ABNORMAL HIGH (ref 70–99)
Glucose-Capillary: 194 mg/dL — ABNORMAL HIGH (ref 70–99)
Glucose-Capillary: 200 mg/dL — ABNORMAL HIGH (ref 70–99)
Glucose-Capillary: 77 mg/dL (ref 70–99)

## 2021-06-06 LAB — BASIC METABOLIC PANEL
Anion gap: 9 (ref 5–15)
BUN: 11 mg/dL (ref 8–23)
CO2: 29 mmol/L (ref 22–32)
Calcium: 8.8 mg/dL — ABNORMAL LOW (ref 8.9–10.3)
Chloride: 101 mmol/L (ref 98–111)
Creatinine, Ser: 0.56 mg/dL — ABNORMAL LOW (ref 0.61–1.24)
GFR, Estimated: >60 mL/min (ref >60)
Glucose, Bld: 141 mg/dL — ABNORMAL HIGH (ref 70–99)
Potassium: 3.5 mmol/L (ref 3.5–5.1)
Sodium: 139 mmol/L (ref 135–145)

## 2021-06-06 NOTE — Progress Notes (Signed)
PROGRESS NOTE    Jose Schroeder  [id number]:[id number] DOB: 06/22/1932 DOA: 05/24/2021 PCP: Juline Patch, MD    Assessment & Plan:   Principal Problem:   Sepsis [facility]) Active Problems:   Diabetes mellitus ([place])   Essential hypertension   Peripheral vascular disease ([place])   Anemia of chronic disease   Gangrene of left foot ([place])   Dementia without behavioral disturbance ([place])   Atherosclerosis of native artery of left lower extremity with gangrene ([place])   Sepsis: POA. See Dr. Marshia Ly note on how pt met sepsis criteria.  W/ left second toe gangrene. Blood cxs NGTD. Abxs were d/c on 06/01/21. Resolved    Left second toe gangrene: w/ progression to bottom & top of foot. 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 Dressing removed on 06/05/21  Anemia of chronic disease: H&H are labile. S/p 2 units of pRBCs transfused so far    Dementia: continue w/ supportive care   PVD: s/p angiogram w/ 2 stents placed & angioplasties as per vascular surg. Continue on statin, plavix    DM2: poorly controlled, HbA1c 8.7. Continue on glargine, SSI w/ accuchecks  HTN: continue on BB   Thrombocytosis: likely reactive. Will continue to monitor   DVT prophylaxis: lovenox  Code Status: full  Family Communication: Disposition Plan: will likely d/c to SNF   Level of care: Med-Surg  Status is: Inpatient Remains inpatient appropriate because: will need SNF placement once cleared by vascular surg      Consultants:  Vascular surg   Procedures: L BKA  Antimicrobials:    Subjective: Pt c/o fatigue   Objective: Vitals:   06/05/21 1200 06/05/21 1804 06/05/21 2207 06/06/21 0545  BP: 131/65 (!) 142/67 (!) 160/83 129/67  Pulse: 92 73 94 86  Resp: $Remo'15 17 16 16  'dryVo$ Temp: (!) 97.5 F (36.4 C) 98.2 F (36.8 C) 97.6 F (36.4 C) 97.8 F (36.6 C)  TempSrc:      SpO2: 100% 100% 100% 100%  Weight:      Height:        Intake/Output Summary (Last 24 hours) at 06/06/2021 0735 Last data filed at 06/05/2021  1145 Gross per 24 hour  Intake 240 ml  Output 450 ml  Net -210 ml    Filed Weights   05/24/21 1249  Weight: 65 kg    Examination:  General exam: Appears calm & comfortable  Respiratory system: clear breath sounds b/l  Cardiovascular system: S1 & S2+. No rubs or clicks Gastrointestinal system: abd is soft, NT, ND & hypoactive bowel sounds  Central nervous system: Alert and awake. Moves all extremities   Psychiatry: judgement and insight appears at baseline. Flat mood and affect    Data Reviewed: I have personally reviewed following labs and imaging studies  CBC: Recent Labs  Lab 06/02/21 0352 06/03/21 0449 06/04/21 0500 06/05/21 0440 06/06/21 0535  WBC 12.9* 14.3* 13.5* 12.4* 11.9*  HGB 8.4* 8.4* 8.9* 8.9* 8.2*  HCT 26.5* 26.8* 28.3* 28.4* 26.5*  MCV 91.4 91.8 92.2 92.2 91.1  PLT 359 364 373 400 536*   Basic Metabolic Panel: Recent Labs  Lab 06/02/21 0352 06/03/21 0449 06/04/21 0500 06/05/21 0440 06/06/21 0535  NA 138 135 135 140 139  K 3.9 4.0 4.0 3.7 3.5  CL 104 100 99 102 101  CO2 $Re'28 28 28 28 29  'miN$ GLUCOSE 84 111* 115* 101* 141*  BUN $Re'9 10 8 11 11  'shK$ CREATININE 0.64 0.55* 0.56* 0.66 0.56*  CALCIUM 8.4* 8.3* 8.6* 8.8*  8.8*   GFR: Estimated Creatinine Clearance: 60.9 mL/min (A) (by C-G formula based on SCr of 0.56 mg/dL (L)). Liver Function Tests: No results for input(s): AST, ALT, ALKPHOS, BILITOT, PROT, ALBUMIN in the last 168 hours. No results for input(s): LIPASE, AMYLASE in the last 168 hours. No results for input(s): AMMONIA in the last 168 hours. Coagulation Profile: No results for input(s): INR, PROTIME in the last 168 hours. Cardiac Enzymes: No results for input(s): CKTOTAL, CKMB, CKMBINDEX, TROPONINI in the last 168 hours. BNP (last 3 results) No results for input(s): PROBNP in the last 8760 hours. HbA1C: No results for input(s): HGBA1C in the last 72 hours. CBG: Recent Labs  Lab 06/04/21 1706 06/04/21 2100 06/05/21 0818 06/05/21 1202  06/06/21 0016  GLUCAP 274* 133* 122* 174* 200*   Lipid Profile: No results for input(s): CHOL, HDL, LDLCALC, TRIG, CHOLHDL, LDLDIRECT in the last 72 hours. Thyroid Function Tests: No results for input(s): TSH, T4TOTAL, FREET4, T3FREE, THYROIDAB in the last 72 hours. Anemia Panel: No results for input(s): VITAMINB12, FOLATE, FERRITIN, TIBC, IRON, RETICCTPCT in the last 72 hours. Sepsis Labs: No results for input(s): PROCALCITON, LATICACIDVEN in the last 168 hours.  Recent Results (from the past 240 hour(s))  CULTURE, BLOOD (ROUTINE X 2) w Reflex to ID Panel     Status: None   Collection Time: 05/30/21  9:19 AM   Specimen: BLOOD  Result Value Ref Range Status   Specimen Description BLOOD RH  Final   Special Requests   Final    BOTTLES DRAWN AEROBIC AND ANAEROBIC Blood Culture adequate volume   Culture   Final    NO GROWTH 5 DAYS Performed at [facility], [address] [place], [place] [address]    Report Status 06/04/2021 FINAL  Final  CULTURE, BLOOD (ROUTINE X 2) w Reflex to ID Panel     Status: None   Collection Time: 05/30/21  9:19 AM   Specimen: BLOOD  Result Value Ref Range Status   Specimen Description BLOOD LW  Final   Special Requests   Final    BOTTLES DRAWN AEROBIC AND ANAEROBIC Blood Culture adequate volume   Culture   Final    NO GROWTH 5 DAYS Performed at [facility], [address], [place], [place] [address]    Report Status 06/04/2021 FINAL  Final         Radiology Studies: No results found.      Scheduled Meds:  amLODipine  5 mg Oral Daily   atorvastatin  20 mg Oral QPM   clopidogrel  75 mg Oral Q breakfast   docusate sodium  200 mg Oral BID   enoxaparin (LOVENOX) injection  40 mg Subcutaneous Q24H   insulin aspart  0-15 Units Subcutaneous TID WC   insulin aspart  0-5 Units Subcutaneous QHS   insulin glargine-yfgn  6 Units Subcutaneous QHS   iron polysaccharides  150 mg Oral Daily   metoprolol tartrate  50 mg Oral BID    polyethylene glycol  17 g Oral Daily   sodium chloride flush  3 mL Intravenous Q12H   Continuous Infusions:  sodium chloride       LOS: 13 days    Time spent: 15 mins    Wyvonnia Dusky, MD [facility] Pager [phone]  If 7PM-7AM, please contact night-coverage 06/06/2021, 7:35 AM

## 2021-06-07 DIAGNOSIS — S88912D Complete traumatic amputation of left lower leg, level unspecified, subsequent encounter: Secondary | ICD-10-CM | POA: Diagnosis not present

## 2021-06-07 DIAGNOSIS — N390 Urinary tract infection, site not specified: Secondary | ICD-10-CM | POA: Diagnosis not present

## 2021-06-07 DIAGNOSIS — Z736 Limitation of activities due to disability: Secondary | ICD-10-CM | POA: Diagnosis not present

## 2021-06-07 DIAGNOSIS — F01518 Vascular dementia, unspecified severity, with other behavioral disturbance: Secondary | ICD-10-CM | POA: Diagnosis not present

## 2021-06-07 DIAGNOSIS — I69815 Cognitive social or emotional deficit following other cerebrovascular disease: Secondary | ICD-10-CM | POA: Diagnosis not present

## 2021-06-07 DIAGNOSIS — M6281 Muscle weakness (generalized): Secondary | ICD-10-CM | POA: Diagnosis not present

## 2021-06-07 DIAGNOSIS — I10 Essential (primary) hypertension: Secondary | ICD-10-CM | POA: Diagnosis not present

## 2021-06-07 DIAGNOSIS — E785 Hyperlipidemia, unspecified: Secondary | ICD-10-CM | POA: Diagnosis not present

## 2021-06-07 DIAGNOSIS — E440 Moderate protein-calorie malnutrition: Secondary | ICD-10-CM | POA: Diagnosis not present

## 2021-06-07 DIAGNOSIS — I110 Hypertensive heart disease with heart failure: Secondary | ICD-10-CM | POA: Diagnosis not present

## 2021-06-07 DIAGNOSIS — R451 Restlessness and agitation: Secondary | ICD-10-CM | POA: Diagnosis not present

## 2021-06-07 DIAGNOSIS — Z7401 Bed confinement status: Secondary | ICD-10-CM | POA: Diagnosis not present

## 2021-06-07 DIAGNOSIS — F0390 Unspecified dementia without behavioral disturbance: Secondary | ICD-10-CM | POA: Diagnosis not present

## 2021-06-07 DIAGNOSIS — M6259 Muscle wasting and atrophy, not elsewhere classified, multiple sites: Secondary | ICD-10-CM | POA: Diagnosis not present

## 2021-06-07 DIAGNOSIS — F02B4 Dementia in other diseases classified elsewhere, moderate, with anxiety: Secondary | ICD-10-CM | POA: Diagnosis not present

## 2021-06-07 DIAGNOSIS — E611 Iron deficiency: Secondary | ICD-10-CM | POA: Diagnosis not present

## 2021-06-07 DIAGNOSIS — D638 Anemia in other chronic diseases classified elsewhere: Secondary | ICD-10-CM | POA: Diagnosis not present

## 2021-06-07 DIAGNOSIS — E119 Type 2 diabetes mellitus without complications: Secondary | ICD-10-CM | POA: Diagnosis not present

## 2021-06-07 DIAGNOSIS — R404 Transient alteration of awareness: Secondary | ICD-10-CM | POA: Diagnosis not present

## 2021-06-07 DIAGNOSIS — I96 Gangrene, not elsewhere classified: Secondary | ICD-10-CM | POA: Diagnosis not present

## 2021-06-07 DIAGNOSIS — R5381 Other malaise: Secondary | ICD-10-CM | POA: Diagnosis not present

## 2021-06-07 DIAGNOSIS — F01B4 Vascular dementia, moderate, with anxiety: Secondary | ICD-10-CM | POA: Diagnosis not present

## 2021-06-07 DIAGNOSIS — E118 Type 2 diabetes mellitus with unspecified complications: Secondary | ICD-10-CM | POA: Diagnosis not present

## 2021-06-07 DIAGNOSIS — N179 Acute kidney failure, unspecified: Secondary | ICD-10-CM | POA: Diagnosis not present

## 2021-06-07 DIAGNOSIS — U071 COVID-19: Secondary | ICD-10-CM | POA: Diagnosis not present

## 2021-06-07 DIAGNOSIS — I739 Peripheral vascular disease, unspecified: Secondary | ICD-10-CM | POA: Diagnosis not present

## 2021-06-07 DIAGNOSIS — Z741 Need for assistance with personal care: Secondary | ICD-10-CM | POA: Diagnosis not present

## 2021-06-07 DIAGNOSIS — Z89512 Acquired absence of left leg below knee: Secondary | ICD-10-CM | POA: Diagnosis not present

## 2021-06-07 DIAGNOSIS — F03918 Unspecified dementia, unspecified severity, with other behavioral disturbance: Secondary | ICD-10-CM | POA: Diagnosis not present

## 2021-06-07 DIAGNOSIS — G301 Alzheimer's disease with late onset: Secondary | ICD-10-CM | POA: Diagnosis not present

## 2021-06-07 DIAGNOSIS — R52 Pain, unspecified: Secondary | ICD-10-CM | POA: Diagnosis not present

## 2021-06-07 DIAGNOSIS — D72829 Elevated white blood cell count, unspecified: Secondary | ICD-10-CM | POA: Diagnosis not present

## 2021-06-07 LAB — CBC
HCT: 26.9 % — ABNORMAL LOW (ref 39.0–52.0)
Hemoglobin: 8.5 g/dL — ABNORMAL LOW (ref 13.0–17.0)
MCH: 28.3 pg (ref 26.0–34.0)
MCHC: 31.6 g/dL (ref 30.0–36.0)
MCV: 89.7 fL (ref 80.0–100.0)
Platelets: 520 10*3/uL — ABNORMAL HIGH (ref 150–400)
RBC: 3 MIL/uL — ABNORMAL LOW (ref 4.22–5.81)
RDW: 14.6 % (ref 11.5–15.5)
WBC: 15.2 10*3/uL — ABNORMAL HIGH (ref 4.0–10.5)
nRBC: 0 % (ref 0.0–0.2)

## 2021-06-07 LAB — RESP PANEL BY RT-PCR (FLU A&B, COVID) ARPGX2
Influenza A by PCR: NEGATIVE
Influenza B by PCR: NEGATIVE
SARS Coronavirus 2 by RT PCR: NEGATIVE

## 2021-06-07 LAB — BASIC METABOLIC PANEL
Anion gap: 13 (ref 5–15)
BUN: 12 mg/dL (ref 8–23)
CO2: 24 mmol/L (ref 22–32)
Calcium: 8.8 mg/dL — ABNORMAL LOW (ref 8.9–10.3)
Chloride: 101 mmol/L (ref 98–111)
Creatinine, Ser: 0.67 mg/dL (ref 0.61–1.24)
GFR, Estimated: >60 mL/min (ref >60)
Glucose, Bld: 145 mg/dL — ABNORMAL HIGH (ref 70–99)
Potassium: 4.7 mmol/L (ref 3.5–5.1)
Sodium: 138 mmol/L (ref 135–145)

## 2021-06-07 LAB — GLUCOSE, CAPILLARY
Glucose-Capillary: 134 mg/dL — ABNORMAL HIGH (ref 70–99)
Glucose-Capillary: 146 mg/dL — ABNORMAL HIGH (ref 70–99)
Glucose-Capillary: 185 mg/dL — ABNORMAL HIGH (ref 70–99)
Glucose-Capillary: 73 mg/dL (ref 70–99)

## 2021-06-07 MED ORDER — CLOPIDOGREL BISULFATE 75 MG PO TABS: 75.0000 mg | ORAL_TABLET | Freq: Every day | ORAL | 0 refills | Status: AC

## 2021-06-07 MED ORDER — OXYCODONE HCL 5 MG PO TABS: 5.0000 mg | ORAL_TABLET | Freq: Four times a day (QID) | ORAL | 0 refills | Status: AC | PRN

## 2021-06-07 NOTE — Progress Notes (Signed)
Report given to Orson Ape, RN at [facility] for room [phone].

## 2021-06-07 NOTE — Progress Notes (Signed)
Occupational Therapy Treatment Patient Details Name: Jose Schroeder MRN: [id number] DOB: [birth year] Today's Date: 06/07/2021   History of present illness 88 y.o. male with medical history significant for dementia, uncontrolled DM, hTN, PAD, right BKA, presenting with altered mental status.  Patient was diagnosed with a right foot infection with gangrene, sepsis.  Vascular has attempted revascualrization to salvage limb, ultimately needed L BKA 2/7.   OT comments  [name] was seen for OT treatment on this date. Upon arrival to room pt reclined in bed. SUPERVISION sup>sit, upon sitting EOB pt removes male purewick and gown. Pt limited by baseline dementia however pleasant t/o. SETUP + SUPERVISION self-feeding/drinking seated EOB. Pt noted to be soiled, requires MAX A pperihygeine at bed level. MIN A for lateral scoot t/f bed>chair, left with lap belt on and RN aware. Pt making good progress toward goals. Pt continues to benefit from skilled OT services to maximize return to PLOF and minimize risk of future falls, injury, caregiver burden, and readmission. Will continue to follow POC. Discharge recommendation remains appropriate.     Recommendations for follow up therapy are one component of a multi-disciplinary discharge planning process, led by the attending physician.  Recommendations may be updated based on patient status, additional functional criteria and insurance authorization.    Follow Up Recommendations  Skilled nursing-short term rehab (<3 hours/day)    Assistance Recommended at Discharge Frequent or constant Supervision/Assistance  Patient can return home with the following  A lot of help with walking and/or transfers;A lot of help with bathing/dressing/bathroom;Assistance with feeding   Equipment Recommendations  BSC/3in1    Recommendations for Other Services      Precautions / Restrictions Precautions Precautions: Fall Restrictions Weight Bearing Restrictions:  Yes LLE Weight Bearing: Non weight bearing       Mobility Bed Mobility Overal bed mobility: Needs Assistance Bed Mobility: Sit to Supine     Supine to sit: Supervision          Transfers Overall transfer level: Needs assistance   Transfers: Bed to chair/wheelchair/BSC            Lateral/Scoot Transfers: Min assist       Balance Overall balance assessment: Needs assistance Sitting-balance support: Bilateral upper extremity supported, Feet unsupported Sitting balance-Leahy Scale: Fair                                     ADL either performed or assessed with clinical judgement   ADL Overall ADL's : Needs assistance/impaired                                       General ADL Comments: Pt limited by baseline dementia however pleasant t/o. MAX A pperihygeine at bed level. MIN A for lateral scoot t/f. SETUP + SUPERVISION self-feeding/drinking seated EOB      Cognition Arousal/Alertness: Awake/alert Behavior During Therapy: WFL for tasks assessed/performed Overall Cognitive Status: History of cognitive impairments - at baseline                                 General Comments: oriented to self only                   Pertinent Vitals/ Pain       Pain  Assessment Pain Assessment: No/denies pain   Frequency  Min 2X/week        Progress Toward Goals  OT Goals(current goals can now be found in the care plan section)  Progress towards OT goals: Progressing toward goals  Acute Rehab OT Goals Patient Stated Goal: to be warm OT Goal Formulation: With patient Time For Goal Achievement: 06/18/21 Potential to Achieve Goals: Fair ADL Goals Pt Will Perform Grooming: with supervision;sitting Pt Will Perform Lower Body Dressing: with min guard assist;with caregiver independent in assisting;sitting/lateral leans Pt Will Transfer to Toilet: with min guard assist;with transfer board  Plan Discharge plan remains  appropriate;Frequency remains appropriate    Co-evaluation                 AM-PAC OT "6 Clicks" Daily Activity     Outcome Measure   Help from another person eating meals?: A Little Help from another person taking care of personal grooming?: A Little Help from another person toileting, which includes using toliet, bedpan, or urinal?: A Lot Help from another person bathing (including washing, rinsing, drying)?: A Lot Help from another person to put on and taking off regular upper body clothing?: A Little Help from another person to put on and taking off regular lower body clothing?: A Lot 6 Click Score: 15    End of Session    OT Visit Diagnosis: Unsteadiness on feet (R26.81)   Activity Tolerance Patient tolerated treatment well   Patient Left in chair;with call bell/phone within reach;with chair alarm set   Nurse Communication Mobility status        Time: [id number] OT Time Calculation (min): 39 min  Charges: OT General Charges $OT Visit: 1 Visit OT Treatments $Self Care/Home Management : 38-52 mins  Dessie Coma, M.S. OTR/L  06/07/21, 10:38 AM  ascom [phone]

## 2021-06-07 NOTE — TOC Progression Note (Signed)
Transition of Care [facility] - Progression Note    Patient Details  Name: Jose Schroeder MRN: [id number] Date of Birth: Dec 12, 1932  [facility] CM/SW Contact  Hetty Ely, RN Phone Number: 06/07/2021, 9:10 AM  Clinical Narrative: Authorization for SNF [facility] approved, [id number]. Tammy from [facility] notified and replied by text that she will send room number after meeting.      Expected Discharge Plan: Skilled Nursing Facility Barriers to Discharge: Continued Medical Work up  Expected Discharge Plan and Services Expected Discharge Plan: Skilled Nursing Facility       Living arrangements for the past 2 months: Single Family Home                                       Social Determinants of Health (SDOH) Interventions    Readmission Risk Interventions No flowsheet data found.

## 2021-06-07 NOTE — Progress Notes (Signed)
Patient transported out by EMS.  Currently no signs of acute distress.

## 2021-06-07 NOTE — TOC Transition Note (Signed)
Transition of Care [facility]) - CM/SW Discharge Note   Patient Details  Name: Jose Schroeder MRN: [id number] Date of Birth: May 12, 1932  Transition of Care [facility]) CM/SW Contact:  Hetty Ely, RN Phone Number: 06/07/2021, 1:21 PM   Clinical Narrative:  Patient to discharge to [facility] today room 608A. TOCRN called wife and she confirms says she will take prosthetic leg to [facility]. Nurse notified of discharge information.     Final next level of care: Skilled Nursing Facility Barriers to Discharge: Barriers Resolved   Patient Goals and CMS Choice        Discharge Placement              Patient chooses bed at: [facility] [place] Patient to be transferred to facility by: [facility] Name of family member notified: Wife Jose Schroeder Patient and family notified of of transfer: 06/07/21  Discharge Plan and Services                                     Social Determinants of Health (SDOH) Interventions     Readmission Risk Interventions No flowsheet data found.

## 2021-06-07 NOTE — Care Management Important Message (Signed)
Important Message  Patient Details  Name: Jose Schroeder MRN: [id number] Date of Birth: Dec 27, 1932   Medicare Important Message Given:  Yes     [name] 06/07/2021, 12:09 PM

## 2021-06-07 NOTE — Discharge Summary (Addendum)
Physician Discharge Summary  Jose Schroeder:[id number] DOB: 1932-05-18 DOA: 05/24/2021  PCP: Juline Patch, MD  Admit date: 05/24/2021 Discharge date: 06/07/2021  Admitted From: home  Disposition:  SNF  Recommendations for Outpatient Follow-up:  Follow up with PCP in 1-2 weeks F/u w/ vasc surg, NP Eulogio Ditch in 2-3 weeks for wound eval & staple removal   Home Health: no  Equipment/Devices:  Discharge Condition: stable  CODE STATUS: full  Diet recommendation: Heart Healthy / Carb Modified   Brief/Interim Summary: You may copy/paste interim summary or write brief hospital course depending on length of stay  Discharge Diagnoses:  Principal Problem:   Sepsis ([place]) Active Problems:   Diabetes mellitus ([place])   Essential hypertension   Peripheral vascular disease ([place])   Anemia of chronic disease   Gangrene of left foot ([place])   Dementia without behavioral disturbance ([place])   Atherosclerosis of native artery of left lower extremity with gangrene ([place])  Sepsis: POA. See Dr. Marshia Ly note on how pt met sepsis criteria.  W/ left second toe gangrene. Blood cxs NGTD. Abxs were d/c on 06/01/21. Resolved    Left second toe gangrene: w/ progression to bottom & top of foot. 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 Dressing removed on 06/05/21  Anemia of chronic disease: H&H are labile. S/p 2 units pRBCs transfused   Dementia: continue w/ supportive care  PVD: s/p angiogram w/ 2 stents placed & angioplasties as per vascular surg. Continue on plavix, statin    DM2: HbA1c 8.7, poorly controlled. Continue on home po anti-DM meds at d/c   HTN: continue on metoprolol  Thrombocytosis: likely reactive. Will continue to monitor   Discharge Instructions  Discharge Instructions     Diet - low sodium heart healthy   Complete by: As directed    Diet Carb Modified   Complete by: As directed    Discharge instructions   Complete by: As directed    F/u w/ PCP in 1-2 weeks. F/u w/  vascular surg, NP Eulogio Ditch in 2-3 weeks for wound check & staple removal   Increase activity slowly   Complete by: As directed    No wound care   Complete by: As directed       Allergies as of 06/07/2021   No Known Allergies      Medication List     STOP taking these medications    aspirin EC 81 MG tablet       TAKE these medications    acetaminophen 325 MG tablet Commonly known as: TYLENOL Take 2 tablets (650 mg total) by mouth every 6 (six) hours as needed for mild pain (or Fever >/= 101).   amLODipine 10 MG tablet Commonly known as: NORVASC Take 1 tablet (10 mg total) by mouth daily.   atorvastatin 20 MG tablet Commonly known as: LIPITOR Take 1 tablet (20 mg total) by mouth daily.   clopidogrel 75 MG tablet Commonly known as: PLAVIX Take 1 tablet (75 mg total) by mouth daily with breakfast. Start taking on: June 08, 2021   ferrous sulfate 325 (65 FE) MG tablet Take 1 tablet (325 mg total) by mouth daily with breakfast.   gabapentin 100 MG capsule Commonly known as: NEURONTIN TAKE 1 CAPSULE BY MOUTH 3 TIMES  DAILY   glipiZIDE 10 MG 24 hr tablet Commonly known as: GLUCOTROL XL Take 1 tablet (10 mg total) by mouth daily with breakfast.   hydrochlorothiazide 25 MG tablet Commonly known as: HYDRODIURIL Take  1 tablet (25 mg total) by mouth daily.   lisinopril 10 MG tablet Commonly known as: ZESTRIL Take 1 tablet (10 mg total) by mouth daily.   metFORMIN 500 MG tablet Commonly known as: GLUCOPHAGE TAKE 1 TABLET BY MOUTH  TWICE DAILY   metoprolol tartrate 50 MG tablet Commonly known as: LOPRESSOR Take 1 tablet (50 mg total) by mouth 2 (two) times daily.   OneTouch Ultra test strip Generic drug: glucose blood USE AS DIRECTED BY  PHYSICIAN   oxyCODONE 5 MG immediate release tablet Commonly known as: Oxy IR/ROXICODONE Take 1 tablet (5 mg total) by mouth every 6 (six) hours as needed for up to 1 day for moderate pain, severe pain or  breakthrough pain.   sitaGLIPtin 100 MG tablet Commonly known as: Januvia Take 1 tablet (100 mg total) by mouth daily.        Contact information for follow-up providers     Schnier, Dolores Lory, MD. Go on 06/28/2021.   Specialties: Vascular Surgery, Cardiology, Radiology, Vascular Surgery Why: follow up after procedure;  Appt @ 11:30 am Contact information: [place] [address] [phone]              Contact information for after-discharge care     Destination     [facility] Preferred SNF .   Service: Skilled Nursing Contact information: [place] [phone]                    No Known Allergies  Consultations: Vascular surg   Procedures/Studies: DG Chest 2 View  Result Date: 05/13/2021 CLINICAL DATA:  Sepsis EXAM: CHEST - 2 VIEW COMPARISON:  None. FINDINGS: The cardiac silhouette, mediastinal and hilar contours are within normal limits. The lungs are clear. No pulmonary edema, pleural effusions or infiltrates. No pulmonary lesions. No pneumothorax. The bony thorax is intact. IMPRESSION: No acute cardiopulmonary findings. Electronically Signed   By: Marijo Sanes M.D.   On: 05/13/2021 13:52   CT HEAD WO CONTRAST (5MM)  Result Date: 05/24/2021 CLINICAL DATA:  Head and neck trauma.  Hyperglycemia.  Fever. EXAM: CT HEAD WITHOUT CONTRAST CT CERVICAL SPINE WITHOUT CONTRAST TECHNIQUE: Multidetector CT imaging of the head and cervical spine was performed following the standard protocol without intravenous contrast. Multiplanar CT image reconstructions of the cervical spine were also generated. RADIATION DOSE REDUCTION: This exam was performed according to the departmental dose-optimization program which includes automated exposure control, adjustment of the mA and/or kV according to patient size and/or use of iterative reconstruction technique. COMPARISON:  Head CT 02/10/2021 FINDINGS: CT  HEAD FINDINGS Brain: There is no evidence of an acute infarct, intracranial hemorrhage, mass, midline shift, or extra-axial fluid collection. Generalized cerebral atrophy is mild for age. Hypodensities in the cerebral white matter bilaterally are unchanged and nonspecific but compatible with mild-to-moderate chronic small vessel ischemic disease. Vascular: Calcified atherosclerosis at the skull base. No hyperdense vessel. Skull: No acute fracture or suspicious osseous lesion. Sinuses/Orbits: Visualized paranasal sinuses and mastoid air cells are clear. Bilateral cataract extraction. Other: None. CT CERVICAL SPINE FINDINGS Alignment: Straightening of the normal cervical lordosis. Trace retrolisthesis of C5 on C6 and C6 on C7. Skull base and vertebrae: No acute fracture or suspicious osseous lesion. Soft tissues and spinal canal: No prevertebral fluid or swelling. No visible canal hematoma. Disc levels: Moderately advanced disc degeneration from C3-4 to C6-7. Moderate multilevel facet arthrosis in the cervical and included upper thoracic spine. Partial interbody and  bilateral facet ankylosis at C4-5. Moderate multilevel neural foraminal stenosis due to uncovertebral and facet spurring. Multilevel spinal stenosis, moderate at C3-4. Upper chest: Biapical pleuroparenchymal lung scarring. Other: Prominent calcific atherosclerosis at the carotid bifurcations. IMPRESSION: 1. No evidence of acute intracranial abnormality. 2. Mild-to-moderate chronic small vessel ischemic disease. 3. No evidence of acute cervical spine fracture. Electronically Signed   By: Logan Bores M.D.   On: 05/24/2021 13:46   CT Cervical Spine Wo Contrast  Result Date: 05/24/2021 CLINICAL DATA:  Head and neck trauma.  Hyperglycemia.  Fever. EXAM: CT HEAD WITHOUT CONTRAST CT CERVICAL SPINE WITHOUT CONTRAST TECHNIQUE: Multidetector CT imaging of the head and cervical spine was performed following the standard protocol without intravenous contrast.  Multiplanar CT image reconstructions of the cervical spine were also generated. RADIATION DOSE REDUCTION: This exam was performed according to the departmental dose-optimization program which includes automated exposure control, adjustment of the mA and/or kV according to patient size and/or use of iterative reconstruction technique. COMPARISON:  Head CT 02/10/2021 FINDINGS: CT HEAD FINDINGS Brain: There is no evidence of an acute infarct, intracranial hemorrhage, mass, midline shift, or extra-axial fluid collection. Generalized cerebral atrophy is mild for age. Hypodensities in the cerebral white matter bilaterally are unchanged and nonspecific but compatible with mild-to-moderate chronic small vessel ischemic disease. Vascular: Calcified atherosclerosis at the skull base. No hyperdense vessel. Skull: No acute fracture or suspicious osseous lesion. Sinuses/Orbits: Visualized paranasal sinuses and mastoid air cells are clear. Bilateral cataract extraction. Other: None. CT CERVICAL SPINE FINDINGS Alignment: Straightening of the normal cervical lordosis. Trace retrolisthesis of C5 on C6 and C6 on C7. Skull base and vertebrae: No acute fracture or suspicious osseous lesion. Soft tissues and spinal canal: No prevertebral fluid or swelling. No visible canal hematoma. Disc levels: Moderately advanced disc degeneration from C3-4 to C6-7. Moderate multilevel facet arthrosis in the cervical and included upper thoracic spine. Partial interbody and bilateral facet ankylosis at C4-5. Moderate multilevel neural foraminal stenosis due to uncovertebral and facet spurring. Multilevel spinal stenosis, moderate at C3-4. Upper chest: Biapical pleuroparenchymal lung scarring. Other: Prominent calcific atherosclerosis at the carotid bifurcations. IMPRESSION: 1. No evidence of acute intracranial abnormality. 2. Mild-to-moderate chronic small vessel ischemic disease. 3. No evidence of acute cervical spine fracture. Electronically Signed    By: Logan Bores M.D.   On: 05/24/2021 13:46   MR FOOT LEFT WO CONTRAST  Result Date: 05/24/2021 CLINICAL DATA:  Foot swelling, down buttock, osteomyelitis suspected. EXAM: MRI OF THE LEFT FOOT WITHOUT CONTRAST TECHNIQUE: Multiplanar, multisequence MR imaging of the left forefoot was performed. No intravenous contrast was administered. COMPARISON:  Radiographs dated May 24, 2021 FINDINGS: Bones/Joint/Cartilage Postsurgical changes with amputation at the proximal interphalangeal joint of the first digit. Fourth ray amputation at the level of the base of the proximal phalanx. Bone marrow edema of the second metatarsal shaft which in the presence of adjacent edema and phlegmonous changes with multiple foci of gas is highly suspicious for acute osteomyelitis. There is also heterogeneously increased signal of the third metatarsal shaft, also concerning for osteomyelitis. Ligaments Collateral ligaments are intact.  Lisfranc ligament is intact. Muscles and Tendons Flexor, peroneal and extensor compartment tendons are intact. 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 Abnormal intramuscular signal of the plantar muscles about the second digit concerning for phlegmonous changes. Soft tissue No fluid collection or hematoma.  No soft tissue mass. IMPRESSION: 1. Abnormal marrow signal of the second and third metatarsals, which in the presence  of adjacent phlegmonous changes about the dorsal and plantar aspect of the foot and marked subcutaneous soft tissue edema is consistent with acute osteomyelitis. 2. Postsurgical changes with prior amputation of the first and fourth digits as described above. Electronically Signed   By: Keane Police D.O.   On: 05/24/2021 21:08   PERIPHERAL VASCULAR CATHETERIZATION  Result Date: 05/25/2021 See surgical note for result.  DG Chest Port 1 View  Result Date: 05/24/2021 CLINICAL DATA:  Hyperglycemia. Right foot discoloration  with fever. Questionable sepsis. EXAM: PORTABLE CHEST 1 VIEW COMPARISON:  Radiographs 05/15/2021 and 02/10/2021. FINDINGS: 1348 hours. Low lung volumes. The heart size and mediastinal contours are normal. The lungs are clear. There is no pleural effusion or pneumothorax. No acute osseous findings are identified. Telemetry leads overlie the chest. IMPRESSION: No evidence of active infection in the chest. Electronically Signed   By: Richardean Sale M.D.   On: 05/24/2021 13:58   DG Chest Portable 1 View  Result Date: 05/15/2021 CLINICAL DATA:  Confusion, elevated white blood cell count EXAM: PORTABLE CHEST 1 VIEW COMPARISON:  02/10/2021 FINDINGS: Heart and mediastinal contours are within normal limits. No focal opacities or effusions. No acute bony abnormality. IMPRESSION: No active disease. Electronically Signed   By: Rolm Baptise M.D.   On: 05/15/2021 00:40   DG Foot Complete Left  Result Date: 05/24/2021 CLINICAL DATA:  Questionable sepsis.  Hyperglycemia.  Black toes. EXAM: LEFT FOOT - COMPLETE 3+ VIEW COMPARISON:  None. FINDINGS: Bones are diffusely osteopenic. Status post first ray amputation at the level of the PIP joint. Fourth ray amputation is identified at the level of the base of the proximal phalanx. Plantar subluxation of the second and third D IP joints identified. No definite fracture or dislocation identified. No focal bone erosions noted. There is diffuse subcutaneous gas overlying the second middle and distal phalanx. IMPRESSION: 1. No acute bone abnormality. 2. Diffuse subcutaneous gas overlying the second middle and distal phalanx. Cannot rule out soft tissue infection. Consider further evaluation with contrast enhanced MRI of the foot. Electronically Signed   By: Kerby Moors M.D.   On: 05/24/2021 14:01   VAS [place] ABI WITH/WO TBI  Result Date: 05/13/2021  LOWER EXTREMITY DOPPLER STUDY Patient Name:  TONY GRANQUIST  Date of Exam:   05/12/2021 Medical Rec #: [id number]             Accession #:    [id number] Date of Birth: 1932-06-25            Patient Gender: M Patient Age:   87 years Exam Location:  [facility] Procedure:      VAS [place] ABI WITH/WO TBI Referring Phys: Hortencia Pilar --------------------------------------------------------------------------------  Indications: Peripheral artery disease, and right BKA.  Comparison Study: 04/23/2020 Performing Technologist: Almira Coaster RVS  Examination Guidelines: A complete evaluation includes at minimum, Doppler waveform signals and systolic blood pressure reading at the level of bilateral brachial, anterior tibial, and posterior tibial arteries, when vessel segments are accessible. Bilateral testing is considered an integral part of a complete examination. Photoelectric Plethysmograph (PPG) waveforms and toe systolic pressure readings are included as required and additional duplex testing as needed. Limited examinations for reoccurring indications may be performed as noted.  ABI Findings: +--------+------------------+-----+--------+--------+  Right    Rt Pressure (mmHg) Index Waveform Comment   +--------+------------------+-----+--------+--------+  Brachial 129                                         +--------+------------------+-----+--------+--------+  ATA                                        Rt BKA    +--------+------------------+-----+--------+--------+ +---------+------------------+-----+----------+-------+  Left      Lt Pressure (mmHg) Index Waveform   Comment  +---------+------------------+-----+----------+-------+  Brachial  135                                          +---------+------------------+-----+----------+-------+  ATA       85                       monophasic .63      +---------+------------------+-----+----------+-------+  PTA       84                 0.62  monophasic          +---------+------------------+-----+----------+-------+  Great Toe 0                  0.00  Absent               +---------+------------------+-----+----------+-------+ +-------+-----------+-----------+------------+------------+  ABI/TBI Today's ABI Today's TBI Previous ABI Previous TBI  +-------+-----------+-----------+------------+------------+  Right   Rt BKA                                             +-------+-----------+-----------+------------+------------+  Left    .63         0           1.10         .74           +-------+-----------+-----------+------------+------------+ Left ABIs and TBIs appear decreased compared to prior study on 04/23/2020.  Summary: Right: Rt BKA. Left: Resting left ankle-brachial index indicates moderate left lower extremity arterial disease. The left toe-brachial index is abnormal.  *See table(s) above for measurements and observations.  Electronically signed by Hortencia Pilar MD on 05/13/2021 at 4:28:02 PM.    Final    (Echo, Carotid, EGD, Colonoscopy, ERCP)    Subjective: Pt c/o fatigue    Discharge Exam: Vitals:   06/07/21 0810 06/07/21 1100  BP: (!) 154/82 (!) 148/76  Pulse: 92 88  Resp: 18 17  Temp: 97.6 F (36.4 C) (!) 97.2 F (36.2 C)  SpO2:     Vitals:   06/07/21 0100 06/07/21 0445 06/07/21 0810 06/07/21 1100  BP: (!) 152/70 (!) 154/72 (!) 154/82 (!) 148/76  Pulse: (!) 101 (!) 102 92 88  Resp: $Remo'17 16 18 17  'gXOBX$ Temp: 98.6 F (37 C) 98.1 F (36.7 C) 97.6 F (36.4 C) (!) 97.2 F (36.2 C)  TempSrc: Oral Oral Oral Oral  SpO2: 100%     Weight:      Height:        General: Pt is alert, awake, not in acute distress Cardiovascular: S1/S2 +, no rubs, no gallops Respiratory: CTA bilaterally, no wheezing, no rhonchi Abdominal: Soft, NT, ND, bowel sounds + Extremities:  no cyanosis of b/l stumps     The results of significant diagnostics from this hospitalization (including imaging, microbiology, ancillary and laboratory) are listed below for reference.     Microbiology: Recent  Results (from the past 240 hour(s))  CULTURE, BLOOD (ROUTINE X 2) w Reflex to  ID Panel     Status: None   Collection Time: 05/30/21  9:19 AM   Specimen: BLOOD  Result Value Ref Range Status   Specimen Description BLOOD RH  Final   Special Requests   Final    BOTTLES DRAWN AEROBIC AND ANAEROBIC Blood Culture adequate volume   Culture   Final    NO GROWTH 5 DAYS Performed at [facility], [address] [place], [place] [address]    Report Status 06/04/2021 FINAL  Final  CULTURE, BLOOD (ROUTINE X 2) w Reflex to ID Panel     Status: None   Collection Time: 05/30/21  9:19 AM   Specimen: BLOOD  Result Value Ref Range Status   Specimen Description BLOOD LW  Final   Special Requests   Final    BOTTLES DRAWN AEROBIC AND ANAEROBIC Blood Culture adequate volume   Culture   Final    NO GROWTH 5 DAYS Performed at [facility], [address] [place], [place] [address]    Report Status 06/04/2021 FINAL  Final     Labs: BNP (last 3 results) No results for input(s): BNP in the last 8760 hours. Basic Metabolic Panel: Recent Labs  Lab 06/03/21 0449 06/04/21 0500 06/05/21 0440 06/06/21 0535 06/07/21 0637  NA 135 135 140 139 138  K 4.0 4.0 3.7 3.5 4.7  CL 100 99 102 101 101  CO2 $Re'28 28 28 29 24  'RgL$ GLUCOSE 111* 115* 101* 141* 145*  BUN $Re'10 8 11 11 12  'EoV$ CREATININE 0.55* 0.56* 0.66 0.56* 0.67  CALCIUM 8.3* 8.6* 8.8* 8.8* 8.8*   Liver Function Tests: No results for input(s): AST, ALT, ALKPHOS, BILITOT, PROT, ALBUMIN in the last 168 hours. No results for input(s): LIPASE, AMYLASE in the last 168 hours. No results for input(s): AMMONIA in the last 168 hours. CBC: Recent Labs  Lab 06/03/21 0449 06/04/21 0500 06/05/21 0440 06/06/21 0535 06/07/21 0637  WBC 14.3* 13.5* 12.4* 11.9* 15.2*  HGB 8.4* 8.9* 8.9* 8.2* 8.5*  HCT 26.8* 28.3* 28.4* 26.5* 26.9*  MCV 91.8 92.2 92.2 91.1 89.7  PLT 364 373 400 411* 520*   Cardiac Enzymes: No results for input(s): CKTOTAL, CKMB, CKMBINDEX, TROPONINI in the last 168 hours. BNP: Invalid input(s):  POCBNP CBG: Recent Labs  Lab 06/06/21 1144 06/06/21 1643 06/06/21 2141 06/07/21 0420 06/07/21 0814  GLUCAP 114* 114* 194* 134* 146*   D-Dimer No results for input(s): DDIMER in the last 72 hours. Hgb A1c No results for input(s): HGBA1C in the last 72 hours. Lipid Profile No results for input(s): CHOL, HDL, LDLCALC, TRIG, CHOLHDL, LDLDIRECT in the last 72 hours. Thyroid function studies No results for input(s): TSH, T4TOTAL, T3FREE, THYROIDAB in the last 72 hours.  Invalid input(s): FREET3 Anemia work up No results for input(s): VITAMINB12, FOLATE, FERRITIN, TIBC, IRON, RETICCTPCT in the last 72 hours. Urinalysis    Component Value Date/Time   COLORURINE YELLOW 05/24/2021 2200   APPEARANCEUR CLEAR (A) 05/24/2021 2200   LABSPEC 1.010 05/24/2021 2200   PHURINE 7.0 05/24/2021 2200   GLUCOSEU 500 (A) 05/24/2021 2200   HGBUR TRACE (A) 05/24/2021 2200   BILIRUBINUR NEGATIVE 05/24/2021 2200   KETONESUR NEGATIVE 05/24/2021 2200   PROTEINUR TRACE (A) 05/24/2021 2200   NITRITE NEGATIVE 05/24/2021 2200   LEUKOCYTESUR NEGATIVE 05/24/2021 2200   Sepsis Labs Invalid input(s): PROCALCITONIN,  WBC,  LACTICIDVEN Microbiology Recent Results (from the past  240 hour(s))  CULTURE, BLOOD (ROUTINE X 2) w Reflex to ID Panel     Status: None   Collection Time: 05/30/21  9:19 AM   Specimen: BLOOD  Result Value Ref Range Status   Specimen Description BLOOD RH  Final   Special Requests   Final    BOTTLES DRAWN AEROBIC AND ANAEROBIC Blood Culture adequate volume   Culture   Final    NO GROWTH 5 DAYS Performed at [facility], [address] [place], [place] [address]    Report Status 06/04/2021 FINAL  Final  CULTURE, BLOOD (ROUTINE X 2) w Reflex to ID Panel     Status: None   Collection Time: 05/30/21  9:19 AM   Specimen: BLOOD  Result Value Ref Range Status   Specimen Description BLOOD LW  Final   Special Requests   Final    BOTTLES DRAWN AEROBIC AND ANAEROBIC Blood Culture  adequate volume   Culture   Final    NO GROWTH 5 DAYS Performed at [facility], [address] [place], [place] [address]    Report Status 06/04/2021 FINAL  Final     Time coordinating discharge: Over 30 minutes  SIGNED:   Wyvonnia Dusky, MD  [facility] 06/07/2021, 11:27 AM Pager   If 7PM-7AM, please contact night-coverage

## 2021-06-08 ENCOUNTER — Telehealth (INDEPENDENT_AMBULATORY_CARE_PROVIDER_SITE_OTHER): Payer: Self-pay

## 2021-06-08 DIAGNOSIS — F01518 Vascular dementia, unspecified severity, with other behavioral disturbance: Secondary | ICD-10-CM | POA: Diagnosis not present

## 2021-06-08 DIAGNOSIS — E440 Moderate protein-calorie malnutrition: Secondary | ICD-10-CM | POA: Diagnosis not present

## 2021-06-08 DIAGNOSIS — M6281 Muscle weakness (generalized): Secondary | ICD-10-CM | POA: Diagnosis not present

## 2021-06-08 DIAGNOSIS — I10 Essential (primary) hypertension: Secondary | ICD-10-CM | POA: Diagnosis not present

## 2021-06-08 DIAGNOSIS — E785 Hyperlipidemia, unspecified: Secondary | ICD-10-CM | POA: Diagnosis not present

## 2021-06-08 DIAGNOSIS — Z89512 Acquired absence of left leg below knee: Secondary | ICD-10-CM | POA: Diagnosis not present

## 2021-06-08 DIAGNOSIS — D638 Anemia in other chronic diseases classified elsewhere: Secondary | ICD-10-CM | POA: Diagnosis not present

## 2021-06-08 DIAGNOSIS — E118 Type 2 diabetes mellitus with unspecified complications: Secondary | ICD-10-CM | POA: Diagnosis not present

## 2021-06-08 NOTE — Telephone Encounter (Signed)
Dry dressing changed daily. Keep wrapped with kerlix and ace bandaid

## 2021-06-08 NOTE — Telephone Encounter (Signed)
A detailed message was left on wound nurse voicemail with wound care orders

## 2021-06-09 DIAGNOSIS — Z89512 Acquired absence of left leg below knee: Secondary | ICD-10-CM | POA: Diagnosis not present

## 2021-06-09 DIAGNOSIS — F03918 Unspecified dementia, unspecified severity, with other behavioral disturbance: Secondary | ICD-10-CM | POA: Diagnosis not present

## 2021-06-10 DIAGNOSIS — Z89512 Acquired absence of left leg below knee: Secondary | ICD-10-CM | POA: Diagnosis not present

## 2021-06-10 DIAGNOSIS — F03918 Unspecified dementia, unspecified severity, with other behavioral disturbance: Secondary | ICD-10-CM | POA: Diagnosis not present

## 2021-06-10 DIAGNOSIS — M6281 Muscle weakness (generalized): Secondary | ICD-10-CM | POA: Diagnosis not present

## 2021-06-14 DIAGNOSIS — E118 Type 2 diabetes mellitus with unspecified complications: Secondary | ICD-10-CM | POA: Diagnosis not present

## 2021-06-14 DIAGNOSIS — M6281 Muscle weakness (generalized): Secondary | ICD-10-CM | POA: Diagnosis not present

## 2021-06-14 DIAGNOSIS — D72829 Elevated white blood cell count, unspecified: Secondary | ICD-10-CM | POA: Diagnosis not present

## 2021-06-14 DIAGNOSIS — U071 COVID-19: Secondary | ICD-10-CM | POA: Diagnosis not present

## 2021-06-17 DIAGNOSIS — Z89512 Acquired absence of left leg below knee: Secondary | ICD-10-CM | POA: Diagnosis not present

## 2021-06-17 DIAGNOSIS — U071 COVID-19: Secondary | ICD-10-CM | POA: Diagnosis not present

## 2021-06-17 DIAGNOSIS — E118 Type 2 diabetes mellitus with unspecified complications: Secondary | ICD-10-CM | POA: Diagnosis not present

## 2021-06-19 ENCOUNTER — Other Ambulatory Visit: Payer: Self-pay | Admitting: Family Medicine

## 2021-06-19 DIAGNOSIS — E7801 Familial hypercholesterolemia: Secondary | ICD-10-CM

## 2021-06-19 DIAGNOSIS — E1159 Type 2 diabetes mellitus with other circulatory complications: Secondary | ICD-10-CM

## 2021-06-19 DIAGNOSIS — I10 Essential (primary) hypertension: Secondary | ICD-10-CM

## 2021-06-21 DIAGNOSIS — Z89512 Acquired absence of left leg below knee: Secondary | ICD-10-CM | POA: Diagnosis not present

## 2021-06-21 DIAGNOSIS — U071 COVID-19: Secondary | ICD-10-CM | POA: Diagnosis not present

## 2021-06-21 DIAGNOSIS — E118 Type 2 diabetes mellitus with unspecified complications: Secondary | ICD-10-CM | POA: Diagnosis not present

## 2021-06-22 DIAGNOSIS — M6281 Muscle weakness (generalized): Secondary | ICD-10-CM | POA: Diagnosis not present

## 2021-06-22 DIAGNOSIS — F03918 Unspecified dementia, unspecified severity, with other behavioral disturbance: Secondary | ICD-10-CM | POA: Diagnosis not present

## 2021-06-22 DIAGNOSIS — Z89512 Acquired absence of left leg below knee: Secondary | ICD-10-CM | POA: Diagnosis not present

## 2021-06-23 DIAGNOSIS — G301 Alzheimer's disease with late onset: Secondary | ICD-10-CM | POA: Diagnosis not present

## 2021-06-23 DIAGNOSIS — F02B4 Dementia in other diseases classified elsewhere, moderate, with anxiety: Secondary | ICD-10-CM | POA: Diagnosis not present

## 2021-06-23 DIAGNOSIS — R451 Restlessness and agitation: Secondary | ICD-10-CM | POA: Diagnosis not present

## 2021-06-23 DIAGNOSIS — F01B4 Vascular dementia, moderate, with anxiety: Secondary | ICD-10-CM | POA: Diagnosis not present

## 2021-06-23 DIAGNOSIS — I69815 Cognitive social or emotional deficit following other cerebrovascular disease: Secondary | ICD-10-CM | POA: Diagnosis not present

## 2021-06-24 DIAGNOSIS — F03918 Unspecified dementia, unspecified severity, with other behavioral disturbance: Secondary | ICD-10-CM | POA: Diagnosis not present

## 2021-06-24 DIAGNOSIS — N179 Acute kidney failure, unspecified: Secondary | ICD-10-CM | POA: Diagnosis not present

## 2021-06-24 DIAGNOSIS — I10 Essential (primary) hypertension: Secondary | ICD-10-CM | POA: Diagnosis not present

## 2021-06-24 DIAGNOSIS — Z89512 Acquired absence of left leg below knee: Secondary | ICD-10-CM | POA: Diagnosis not present

## 2021-06-28 ENCOUNTER — Ambulatory Visit (INDEPENDENT_AMBULATORY_CARE_PROVIDER_SITE_OTHER): Payer: Medicare Other | Admitting: Vascular Surgery

## 2021-06-28 DIAGNOSIS — Z89512 Acquired absence of left leg below knee: Secondary | ICD-10-CM | POA: Diagnosis not present

## 2021-06-28 DIAGNOSIS — F03918 Unspecified dementia, unspecified severity, with other behavioral disturbance: Secondary | ICD-10-CM | POA: Diagnosis not present

## 2021-06-28 DIAGNOSIS — R509 Fever, unspecified: Secondary | ICD-10-CM | POA: Diagnosis not present

## 2021-06-29 DIAGNOSIS — R8290 Unspecified abnormal findings in urine: Secondary | ICD-10-CM | POA: Diagnosis not present

## 2021-06-29 DIAGNOSIS — R400 Somnolence: Secondary | ICD-10-CM | POA: Diagnosis not present

## 2021-06-29 DIAGNOSIS — I10 Essential (primary) hypertension: Secondary | ICD-10-CM | POA: Diagnosis not present

## 2021-06-29 DIAGNOSIS — Z89512 Acquired absence of left leg below knee: Secondary | ICD-10-CM | POA: Diagnosis not present

## 2021-06-29 DIAGNOSIS — D72829 Elevated white blood cell count, unspecified: Secondary | ICD-10-CM | POA: Diagnosis not present

## 2021-06-30 DIAGNOSIS — N390 Urinary tract infection, site not specified: Secondary | ICD-10-CM | POA: Diagnosis not present

## 2021-06-30 DIAGNOSIS — D72829 Elevated white blood cell count, unspecified: Secondary | ICD-10-CM | POA: Diagnosis not present

## 2021-07-05 ENCOUNTER — Ambulatory Visit (INDEPENDENT_AMBULATORY_CARE_PROVIDER_SITE_OTHER): Payer: Medicare Other | Admitting: Vascular Surgery

## 2021-07-08 ENCOUNTER — Ambulatory Visit (INDEPENDENT_AMBULATORY_CARE_PROVIDER_SITE_OTHER): Payer: Medicare Other | Admitting: Vascular Surgery

## 2021-07-08 ENCOUNTER — Encounter (INDEPENDENT_AMBULATORY_CARE_PROVIDER_SITE_OTHER): Payer: Medicare Other

## 2021-07-24 DEATH — deceased

## 2021-11-01 ENCOUNTER — Ambulatory Visit: Payer: Medicare Other

## 2022-08-15 IMAGING — CT CT CERVICAL SPINE W/O CM
3 of 4 series · 12 of 33 positions shown, 14 images · non-contrast
Comparison: Head CT 02/10/2021

CLINICAL DATA: Head and neck trauma.  Hyperglycemia.  Fever.



[Series 4: sagittal bone · sagittal · 0.28mm/px · 5 of 81 slices shown, 6 images]
[im 27/81  bone]
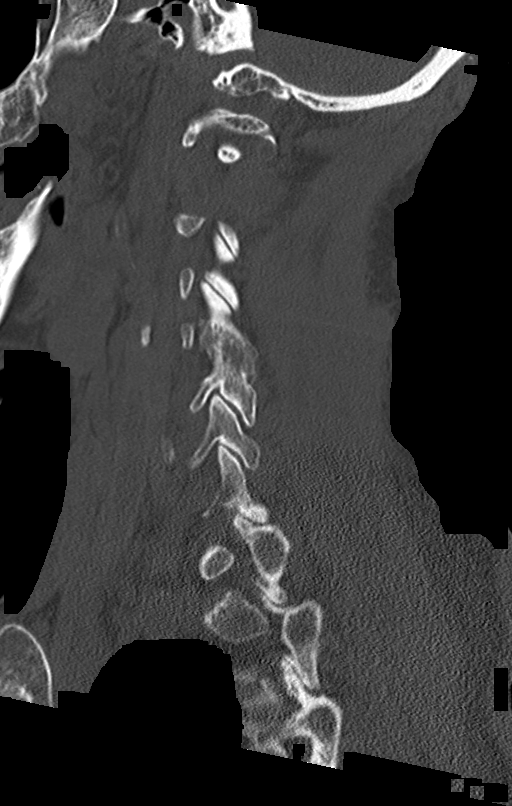
[im 34/81  bone]
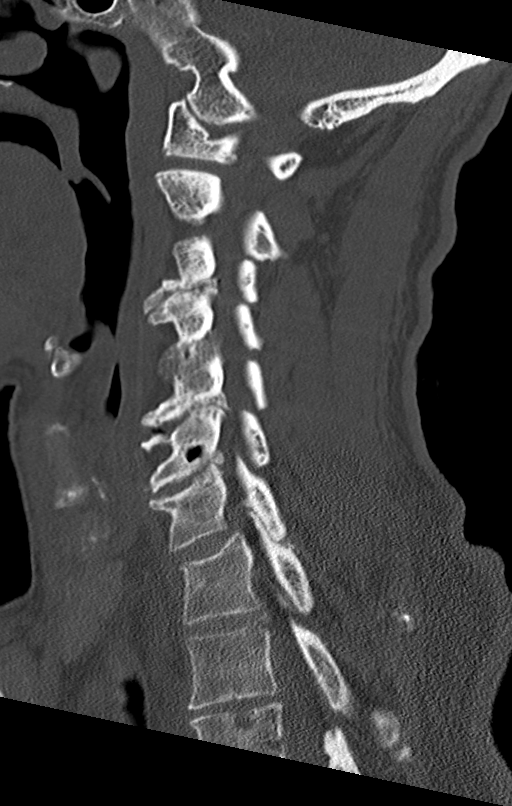
[im 41/81  soft-tissue]
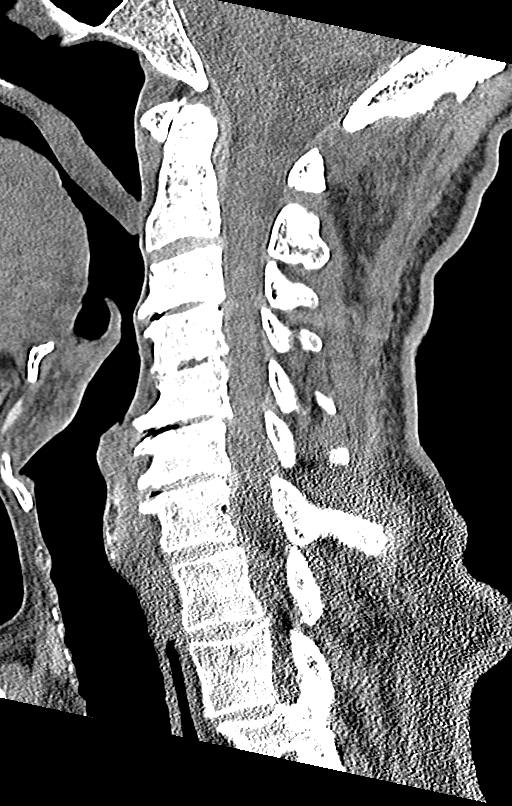
[im 41/81  bone]
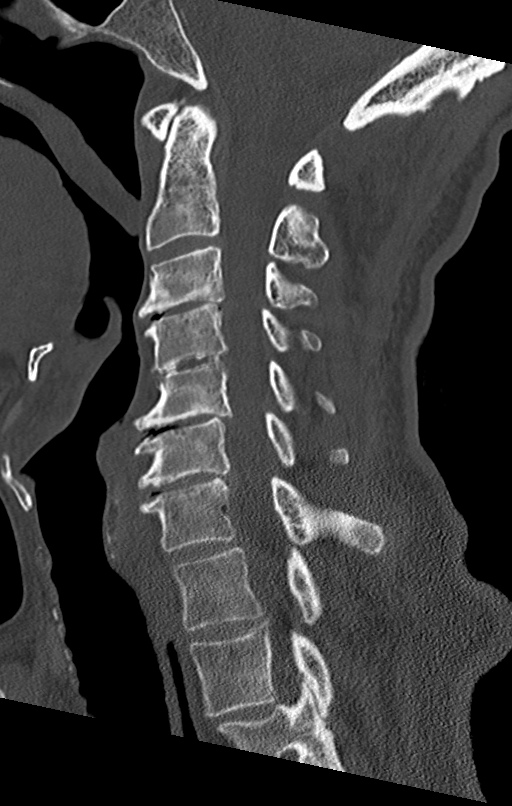
[im 47/81  bone]
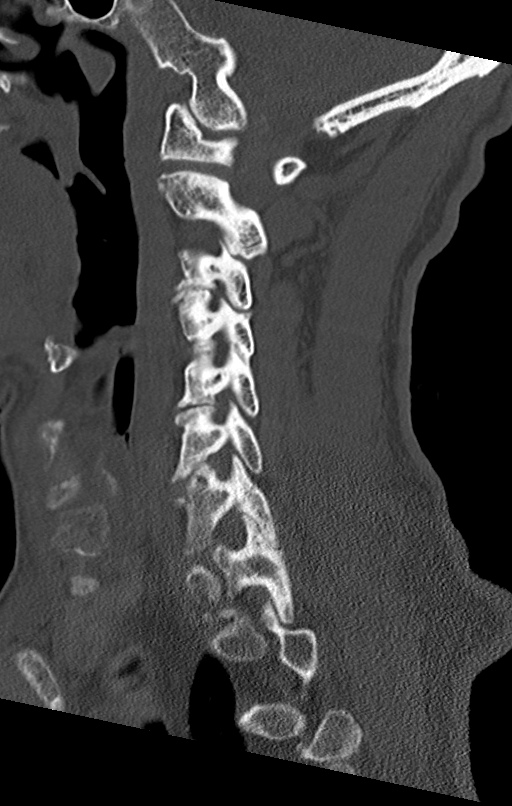
[im 54/81  bone]
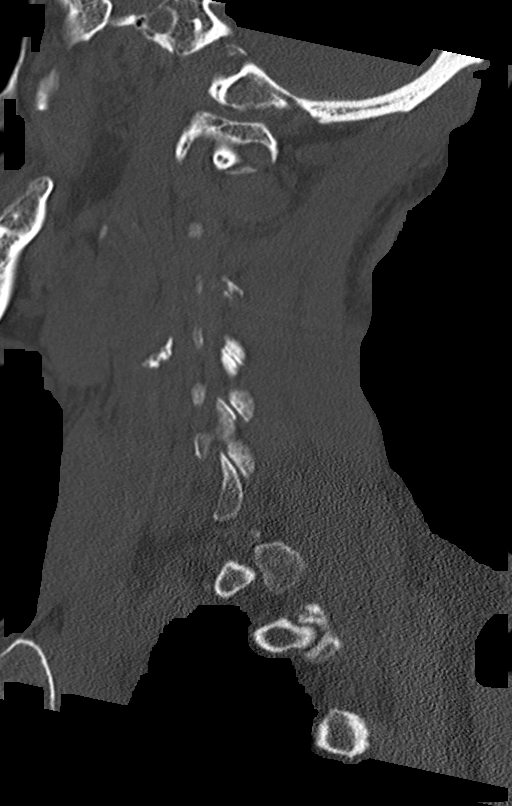

[Series 5: coronal bone · coronal · 0.31mm/px · 3 of 72 slices shown]
[im 15/72  bone]
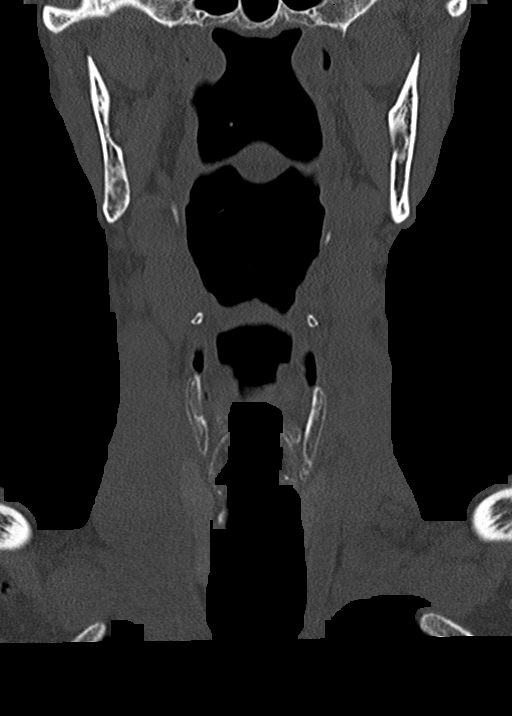
[im 29/72  bone]
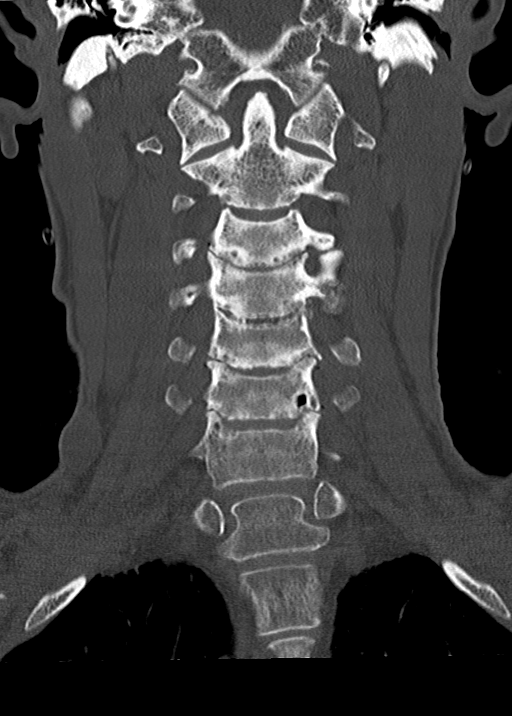
[im 43/72  bone]
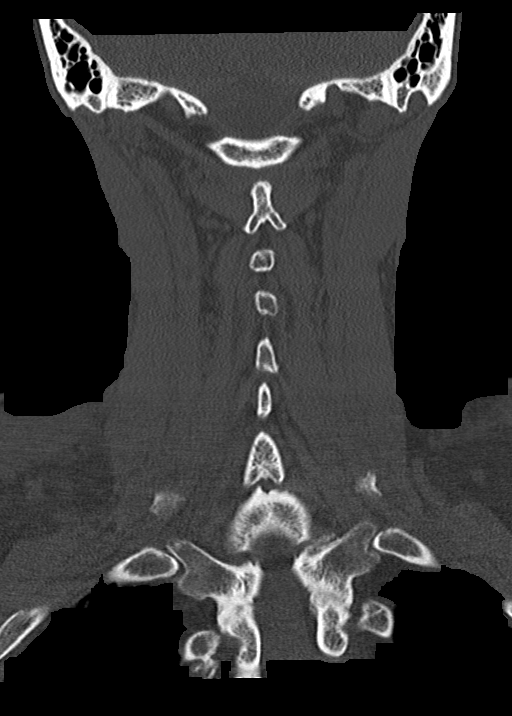

[Series 6: orthogonal bone · axial · 0.28mm/px · z∈[-402,-255]mm · 4 of 113 slices shown, 5 images]
[im 19/113  soft-tissue]
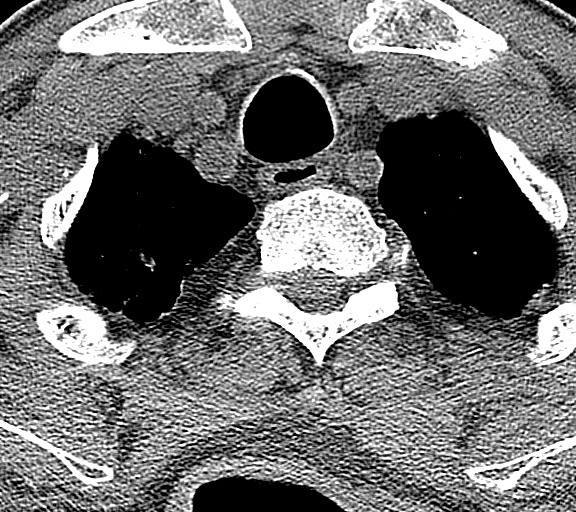
[im 19/113  bone]
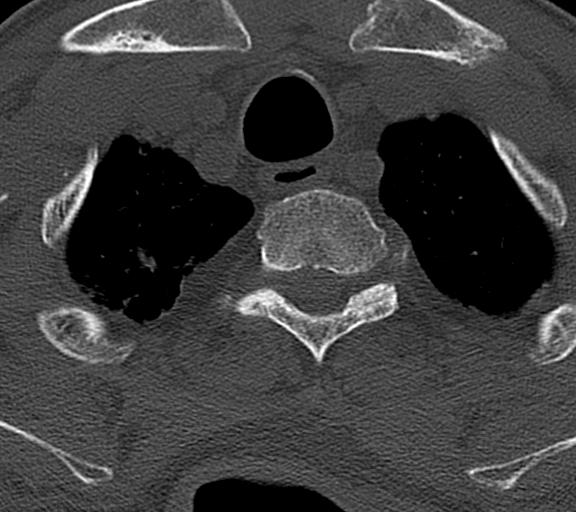
[im 38/113  bone]
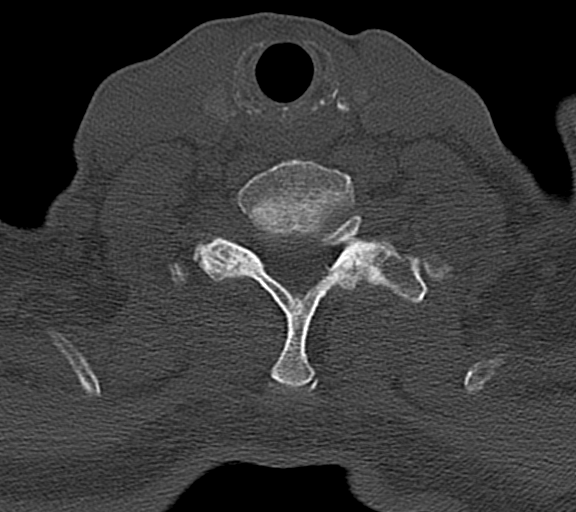
[im 75/113  bone]
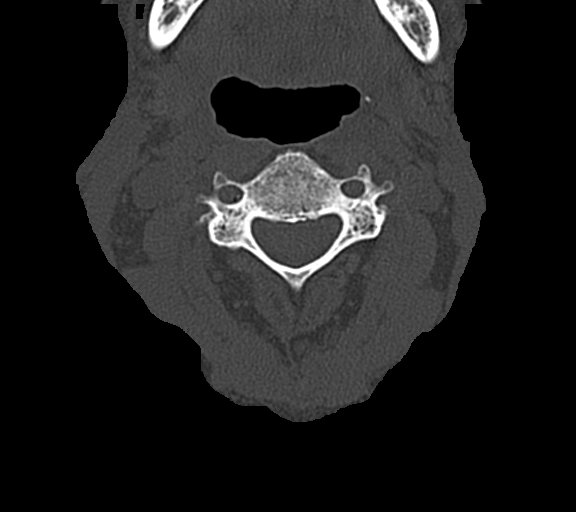
[im 94/113  bone]
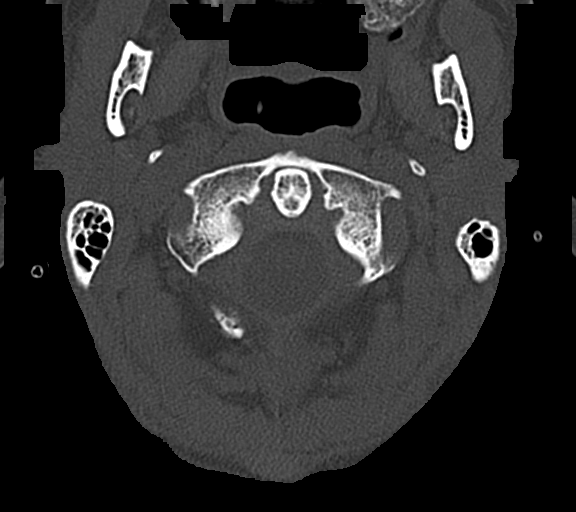

[12 of 33 positions shown; findings below may reference images not displayed]

FINDINGS: CT HEAD FINDINGS

Brain: There is no evidence of an acute infarct, intracranial
hemorrhage, mass, midline shift, or extra-axial fluid collection.
Generalized cerebral atrophy is mild for age. Hypodensities in the
cerebral white matter bilaterally are unchanged and nonspecific but
compatible with mild-to-moderate chronic small vessel ischemic
disease.

Vascular: Calcified atherosclerosis at the skull base. No hyperdense
vessel.

Skull: No acute fracture or suspicious osseous lesion.

Sinuses/Orbits: Visualized paranasal sinuses and mastoid air cells
are clear. Bilateral cataract extraction.

Other: None.

CT CERVICAL SPINE FINDINGS

Alignment: Straightening of the normal cervical lordosis. Trace
retrolisthesis of C5 on C6 and C6 on C7.

Skull base and vertebrae: No acute fracture or suspicious osseous
lesion.

Soft tissues and spinal canal: No prevertebral fluid or swelling. No
visible canal hematoma.

Disc levels: Moderately advanced disc degeneration from C3-4 to
C6-7. Moderate multilevel facet arthrosis in the cervical and
included upper thoracic spine. Partial interbody and bilateral facet
ankylosis at C4-5. Moderate multilevel neural foraminal stenosis due
to uncovertebral and facet spurring. Multilevel spinal stenosis,
moderate at C3-4.

Upper chest: Biapical pleuroparenchymal lung scarring.

Other: Prominent calcific atherosclerosis at the carotid
bifurcations.
IMPRESSION: 1. No evidence of acute intracranial abnormality.
2. Mild-to-moderate chronic small vessel ischemic disease.
3. No evidence of acute cervical spine fracture.

## 2022-08-15 IMAGING — CT CT HEAD W/O CM
4 series · 16 of 47 positions shown, 18 images · non-contrast
Comparison: Head CT 02/10/2021

CLINICAL DATA: Head and neck trauma.  Hyperglycemia.  Fever.



[Series 2: head bone · axial · 0.45mm/px · z∈[-196,-166]mm · 3 of 76 slices shown]
[im 8/76  bone]
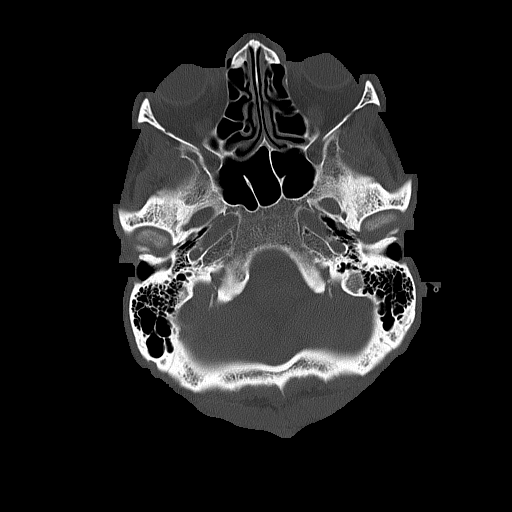
[im 16/76  bone]
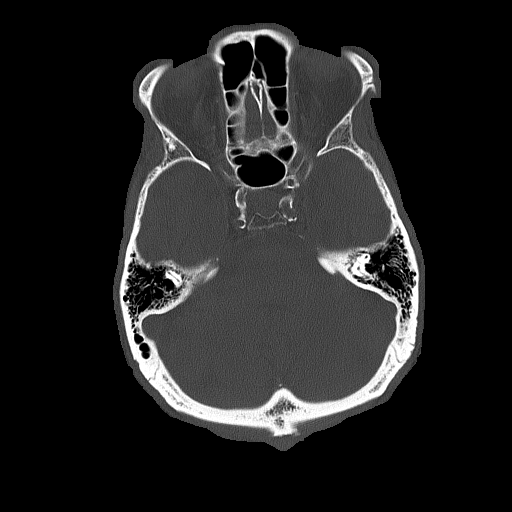
[im 23/76  bone]
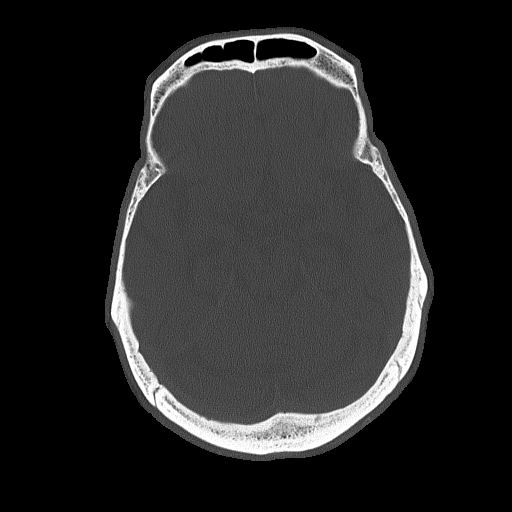

[Series 3: head wo · axial · 0.45mm/px · z∈[-195,-80]mm · 7 of 31 slices shown, 9 images]
[im 4/31  brain]
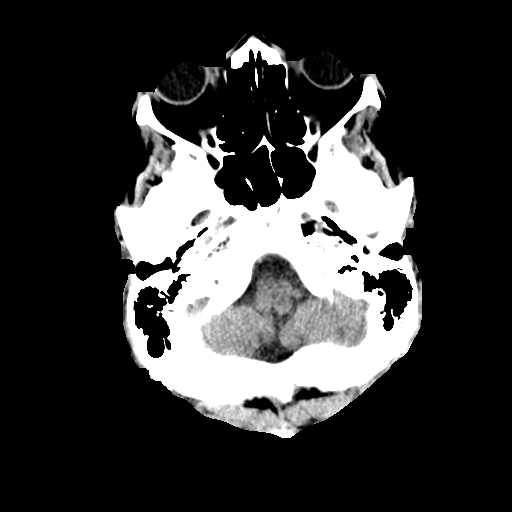
[im 4/31  bone]
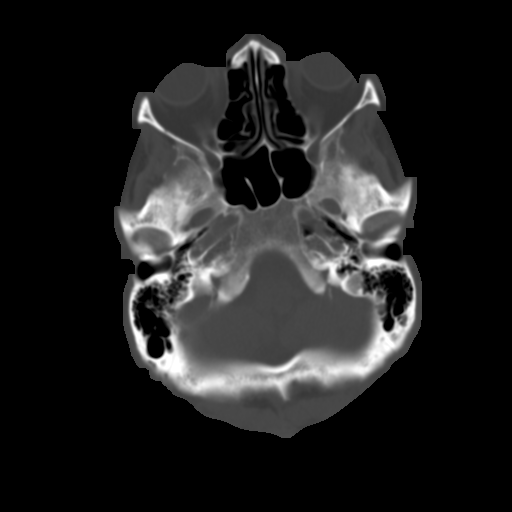
[im 8/31  brain]
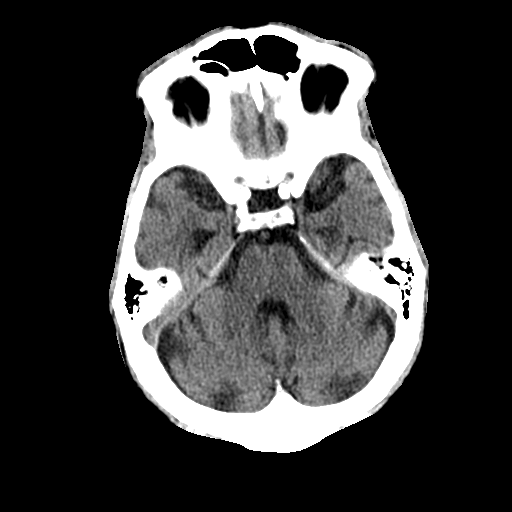
[im 12/31  brain]
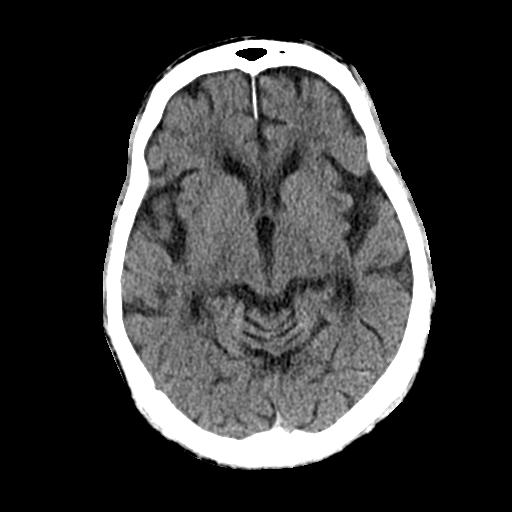
[im 16/31  brain]
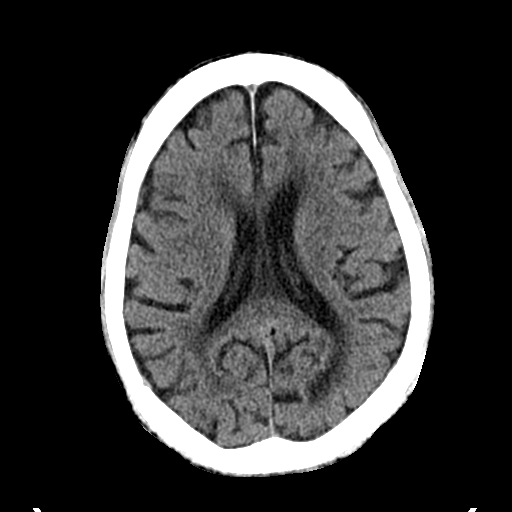
[im 19/31  brain]
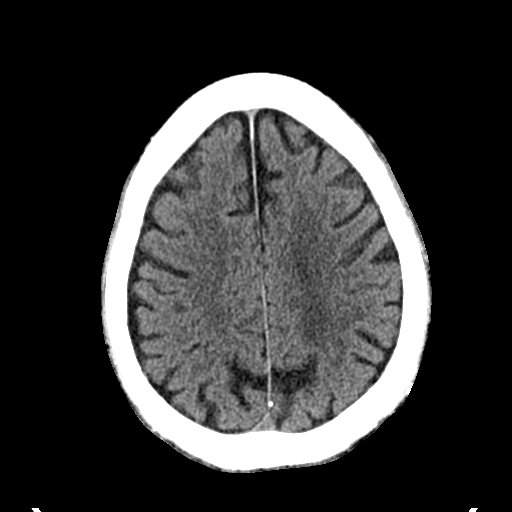
[im 19/31  bone]
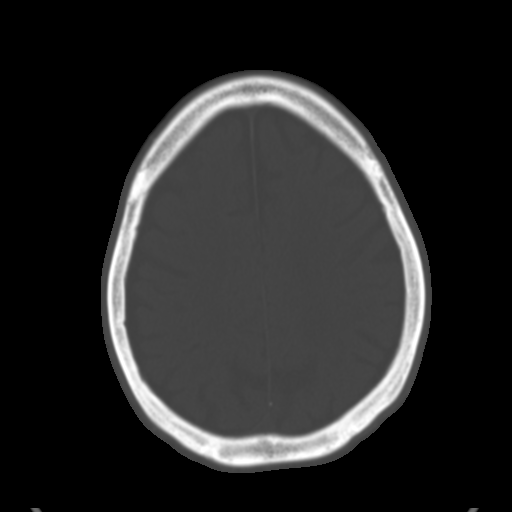
[im 23/31  brain]
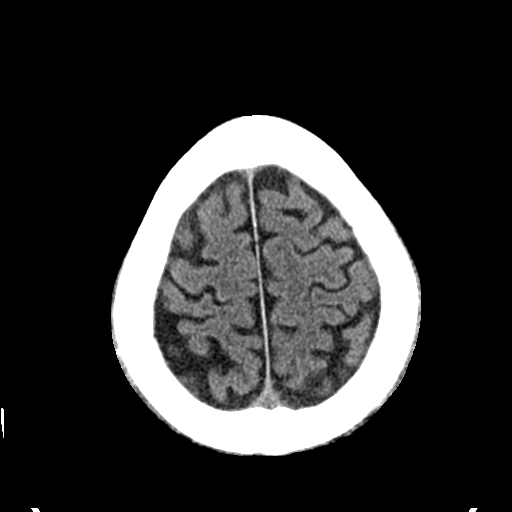
[im 27/31  brain]
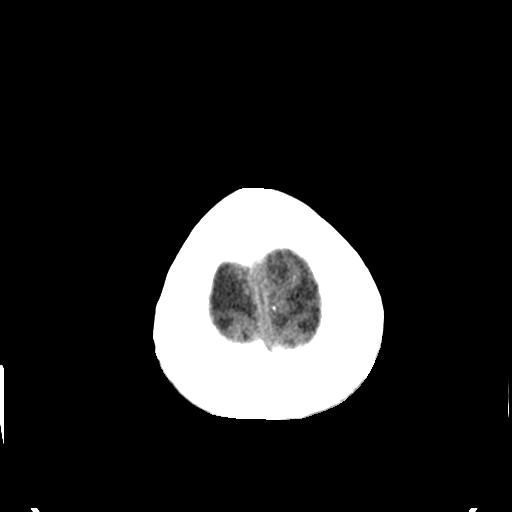

[Series 4: coronal soft tissue · coronal · 0.34mm/px · 3 of 68 slices shown]
[im 23/68  brain]
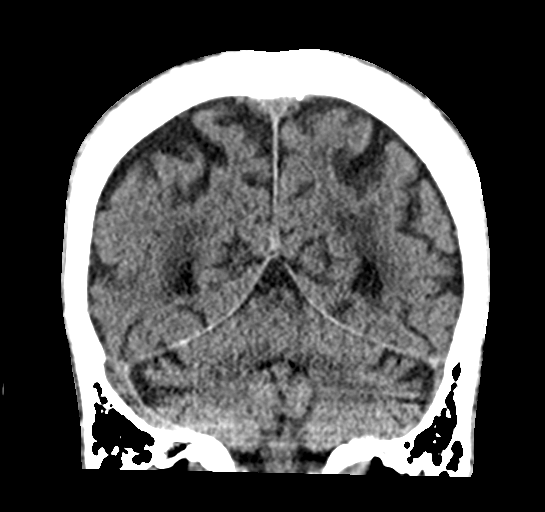
[im 30/68  brain]
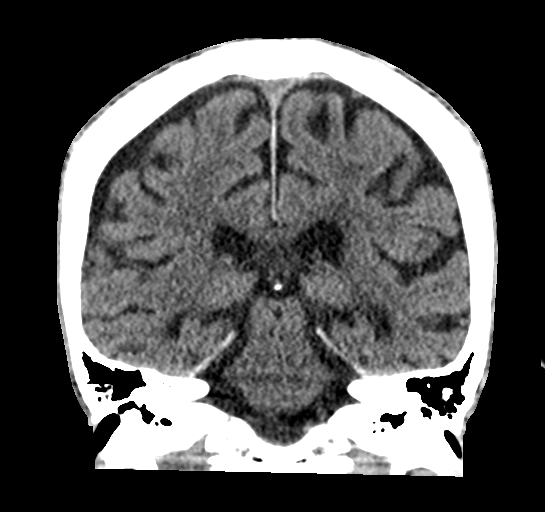
[im 38/68  brain]
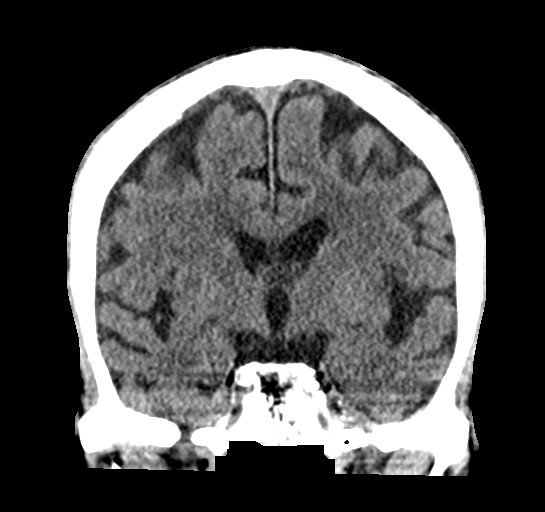

[Series 5: sagittal soft tissue · sagittal · 0.34mm/px · 3 of 62 slices shown]
[im 21/62  brain]
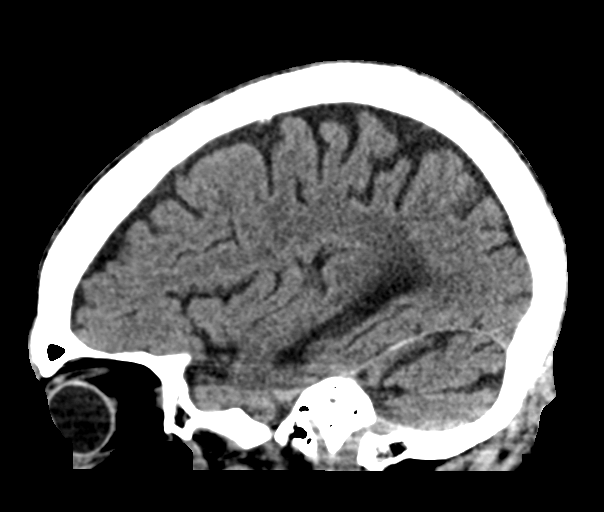
[im 31/62  brain]
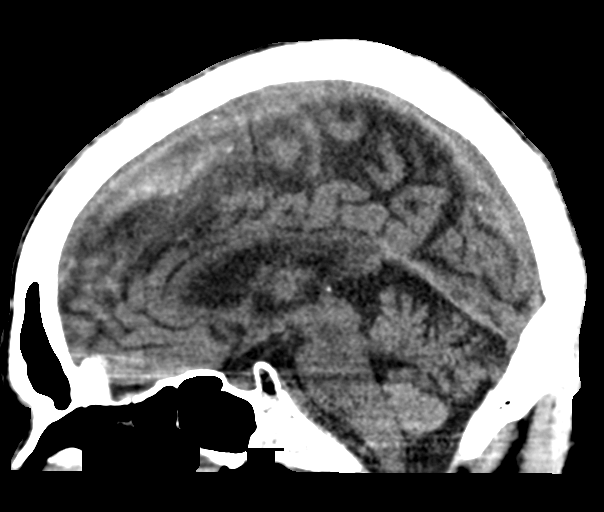
[im 41/62  brain]
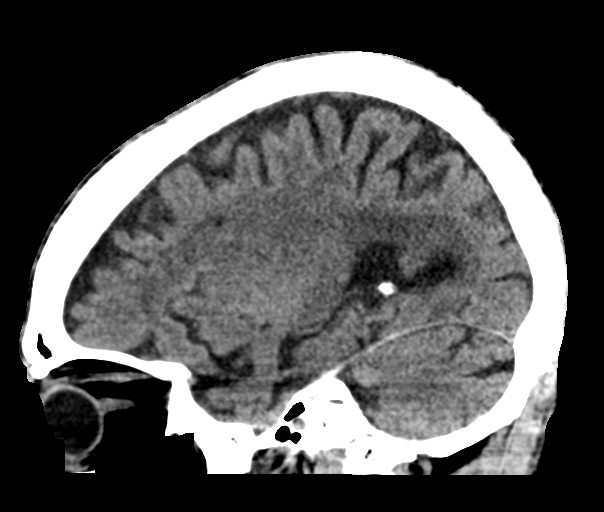

[16 of 47 positions shown; findings below may reference images not displayed]

FINDINGS: CT HEAD FINDINGS

Brain: There is no evidence of an acute infarct, intracranial
hemorrhage, mass, midline shift, or extra-axial fluid collection.
Generalized cerebral atrophy is mild for age. Hypodensities in the
cerebral white matter bilaterally are unchanged and nonspecific but
compatible with mild-to-moderate chronic small vessel ischemic
disease.

Vascular: Calcified atherosclerosis at the skull base. No hyperdense
vessel.

Skull: No acute fracture or suspicious osseous lesion.

Sinuses/Orbits: Visualized paranasal sinuses and mastoid air cells
are clear. Bilateral cataract extraction.

Other: None.

CT CERVICAL SPINE FINDINGS

Alignment: Straightening of the normal cervical lordosis. Trace
retrolisthesis of C5 on C6 and C6 on C7.

Skull base and vertebrae: No acute fracture or suspicious osseous
lesion.

Soft tissues and spinal canal: No prevertebral fluid or swelling. No
visible canal hematoma.

Disc levels: Moderately advanced disc degeneration from C3-4 to
C6-7. Moderate multilevel facet arthrosis in the cervical and
included upper thoracic spine. Partial interbody and bilateral facet
ankylosis at C4-5. Moderate multilevel neural foraminal stenosis due
to uncovertebral and facet spurring. Multilevel spinal stenosis,
moderate at C3-4.

Upper chest: Biapical pleuroparenchymal lung scarring.

Other: Prominent calcific atherosclerosis at the carotid
bifurcations.
IMPRESSION: 1. No evidence of acute intracranial abnormality.
2. Mild-to-moderate chronic small vessel ischemic disease.
3. No evidence of acute cervical spine fracture.
